# Patient Record
Sex: Male | Born: 1975
Health system: Southern US, Community
[De-identification: ages and names within clinical notes are randomized; demographics above are authoritative.]

## PROBLEM LIST (undated history)

## (undated) ENCOUNTER — Emergency Department (HOSPITAL_COMMUNITY): Admission: EM | Source: Ambulatory Visit

## (undated) ENCOUNTER — Emergency Department: Admission: EM | Payer: No Typology Code available for payment source

## (undated) DIAGNOSIS — L0390 Cellulitis, unspecified: Secondary | ICD-10-CM

## (undated) DIAGNOSIS — Z720 Tobacco use: Secondary | ICD-10-CM

## (undated) DIAGNOSIS — D649 Anemia, unspecified: Secondary | ICD-10-CM

## (undated) DIAGNOSIS — F1990 Other psychoactive substance use, unspecified, uncomplicated: Secondary | ICD-10-CM

## (undated) DIAGNOSIS — F32A Depression, unspecified: Secondary | ICD-10-CM

## (undated) DIAGNOSIS — K921 Melena: Secondary | ICD-10-CM

## (undated) DIAGNOSIS — L0291 Cutaneous abscess, unspecified: Secondary | ICD-10-CM

## (undated) DIAGNOSIS — A4902 Methicillin resistant Staphylococcus aureus infection, unspecified site: Secondary | ICD-10-CM

## (undated) DIAGNOSIS — K219 Gastro-esophageal reflux disease without esophagitis: Secondary | ICD-10-CM

## (undated) DIAGNOSIS — M79604 Pain in right leg: Secondary | ICD-10-CM

## (undated) DIAGNOSIS — Z21 Asymptomatic human immunodeficiency virus [HIV] infection status: Secondary | ICD-10-CM

## (undated) HISTORY — PX: HAND / FINGER LESION EXCISION: SUR531

## (undated) HISTORY — PX: COLONOSCOPY: SHX174

---

## 2003-04-30 ENCOUNTER — Emergency Department (HOSPITAL_COMMUNITY): Admission: EM | Admit: 2003-04-30 | Discharge: 2003-04-30 | Payer: Self-pay | Admitting: Emergency Medicine

## 2006-10-24 DIAGNOSIS — A5273 Symptomatic late syphilis of other respiratory organs: Secondary | ICD-10-CM

## 2006-10-24 HISTORY — DX: Symptomatic late syphilis of other respiratory organs: A52.73

## 2006-11-10 ENCOUNTER — Ambulatory Visit: Payer: Self-pay | Admitting: Internal Medicine

## 2006-11-10 ENCOUNTER — Encounter (INDEPENDENT_AMBULATORY_CARE_PROVIDER_SITE_OTHER): Payer: Self-pay | Admitting: *Deleted

## 2006-11-10 ENCOUNTER — Encounter: Admission: RE | Admit: 2006-11-10 | Discharge: 2006-11-10 | Payer: Self-pay | Admitting: Internal Medicine

## 2006-11-10 LAB — CONVERTED CEMR LAB
CD4 Count: 160 microliters
HIV 1 RNA Quant: 96900 copies/mL

## 2006-11-22 ENCOUNTER — Ambulatory Visit: Payer: Self-pay | Admitting: Internal Medicine

## 2006-11-28 ENCOUNTER — Ambulatory Visit: Payer: Self-pay | Admitting: Internal Medicine

## 2006-11-28 DIAGNOSIS — B20 Human immunodeficiency virus [HIV] disease: Secondary | ICD-10-CM | POA: Insufficient documentation

## 2006-11-28 DIAGNOSIS — A539 Syphilis, unspecified: Secondary | ICD-10-CM | POA: Insufficient documentation

## 2006-12-03 ENCOUNTER — Emergency Department (HOSPITAL_COMMUNITY): Admission: EM | Admit: 2006-12-03 | Discharge: 2006-12-04 | Payer: Self-pay | Admitting: Emergency Medicine

## 2006-12-05 ENCOUNTER — Ambulatory Visit: Payer: Self-pay | Admitting: Internal Medicine

## 2006-12-06 ENCOUNTER — Encounter: Payer: Self-pay | Admitting: Infectious Disease

## 2006-12-06 ENCOUNTER — Ambulatory Visit: Payer: Self-pay | Admitting: Internal Medicine

## 2006-12-18 ENCOUNTER — Encounter (INDEPENDENT_AMBULATORY_CARE_PROVIDER_SITE_OTHER): Payer: Self-pay | Admitting: *Deleted

## 2006-12-18 LAB — CONVERTED CEMR LAB
CD4 Count: 0 microliters
HIV 1 RNA Quant: 0 copies/mL
RPR Titer: 1:16 {titer}

## 2006-12-31 ENCOUNTER — Encounter (INDEPENDENT_AMBULATORY_CARE_PROVIDER_SITE_OTHER): Payer: Self-pay | Admitting: *Deleted

## 2007-01-23 ENCOUNTER — Ambulatory Visit: Payer: Self-pay | Admitting: Internal Medicine

## 2007-01-23 ENCOUNTER — Encounter: Admission: RE | Admit: 2007-01-23 | Discharge: 2007-01-23 | Payer: Self-pay | Admitting: Internal Medicine

## 2007-01-23 LAB — CONVERTED CEMR LAB
Albumin: 4 g/dL (ref 3.5–5.2)
Alkaline Phosphatase: 81 units/L (ref 39–117)
BUN: 15 mg/dL (ref 6–23)
Basophils Relative: 1 % (ref 0–1)
CD4 Count: 270 microliters
CO2: 25 meq/L (ref 19–32)
Chloride: 106 meq/L (ref 96–112)
Eosinophils Absolute: 0.1 10*3/uL (ref 0.0–0.7)
Eosinophils Relative: 3 % (ref 0–5)
Glucose, Bld: 61 mg/dL — ABNORMAL LOW (ref 70–99)
Lymphocytes Relative: 59 % — ABNORMAL HIGH (ref 12–46)
Neutro Abs: 0.5 10*3/uL — ABNORMAL LOW (ref 1.7–7.7)
Platelets: 237 10*3/uL (ref 150–400)
RBC: 4.84 M/uL (ref 4.22–5.81)
RDW: 15.1 % — ABNORMAL HIGH (ref 11.5–14.0)
Total Bilirubin: 0.4 mg/dL (ref 0.3–1.2)

## 2007-01-25 ENCOUNTER — Telehealth: Payer: Self-pay | Admitting: Internal Medicine

## 2007-02-21 ENCOUNTER — Telehealth: Payer: Self-pay | Admitting: Internal Medicine

## 2007-03-28 ENCOUNTER — Telehealth: Payer: Self-pay | Admitting: Internal Medicine

## 2007-04-09 ENCOUNTER — Encounter: Payer: Self-pay | Admitting: Internal Medicine

## 2007-05-22 ENCOUNTER — Telehealth: Payer: Self-pay | Admitting: Internal Medicine

## 2007-06-19 ENCOUNTER — Telehealth: Payer: Self-pay | Admitting: Internal Medicine

## 2007-07-10 ENCOUNTER — Telehealth: Payer: Self-pay | Admitting: Internal Medicine

## 2007-07-23 ENCOUNTER — Telehealth: Payer: Self-pay | Admitting: Internal Medicine

## 2007-08-21 ENCOUNTER — Telehealth: Payer: Self-pay | Admitting: Internal Medicine

## 2007-09-05 IMAGING — CR DG CHEST 2V
2 series · 2 of 2 positions shown · non-contrast
Comparison: None.

CLINICAL DATA: Chest congestion.  Rash on back.  Fever. 
 CHEST ? 2 VIEW:

[view not recorded (1 of 2)]
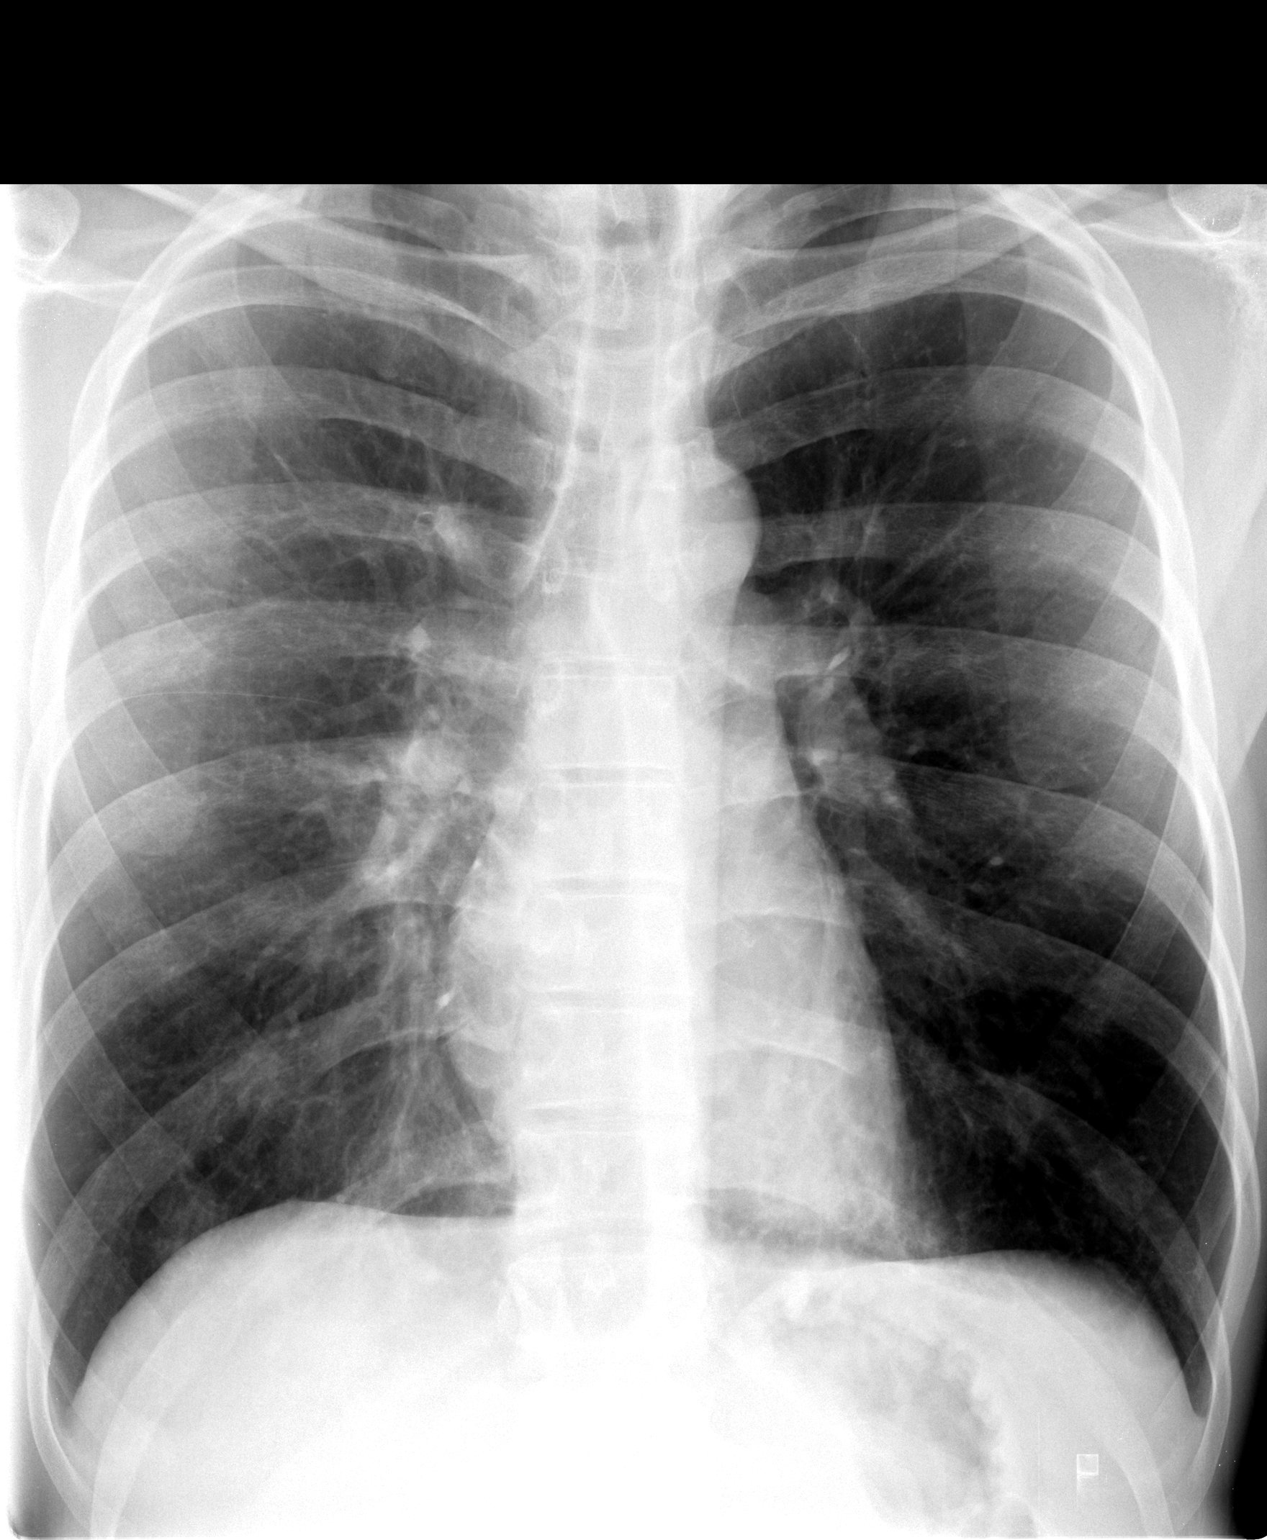

[view not recorded (2 of 2)]
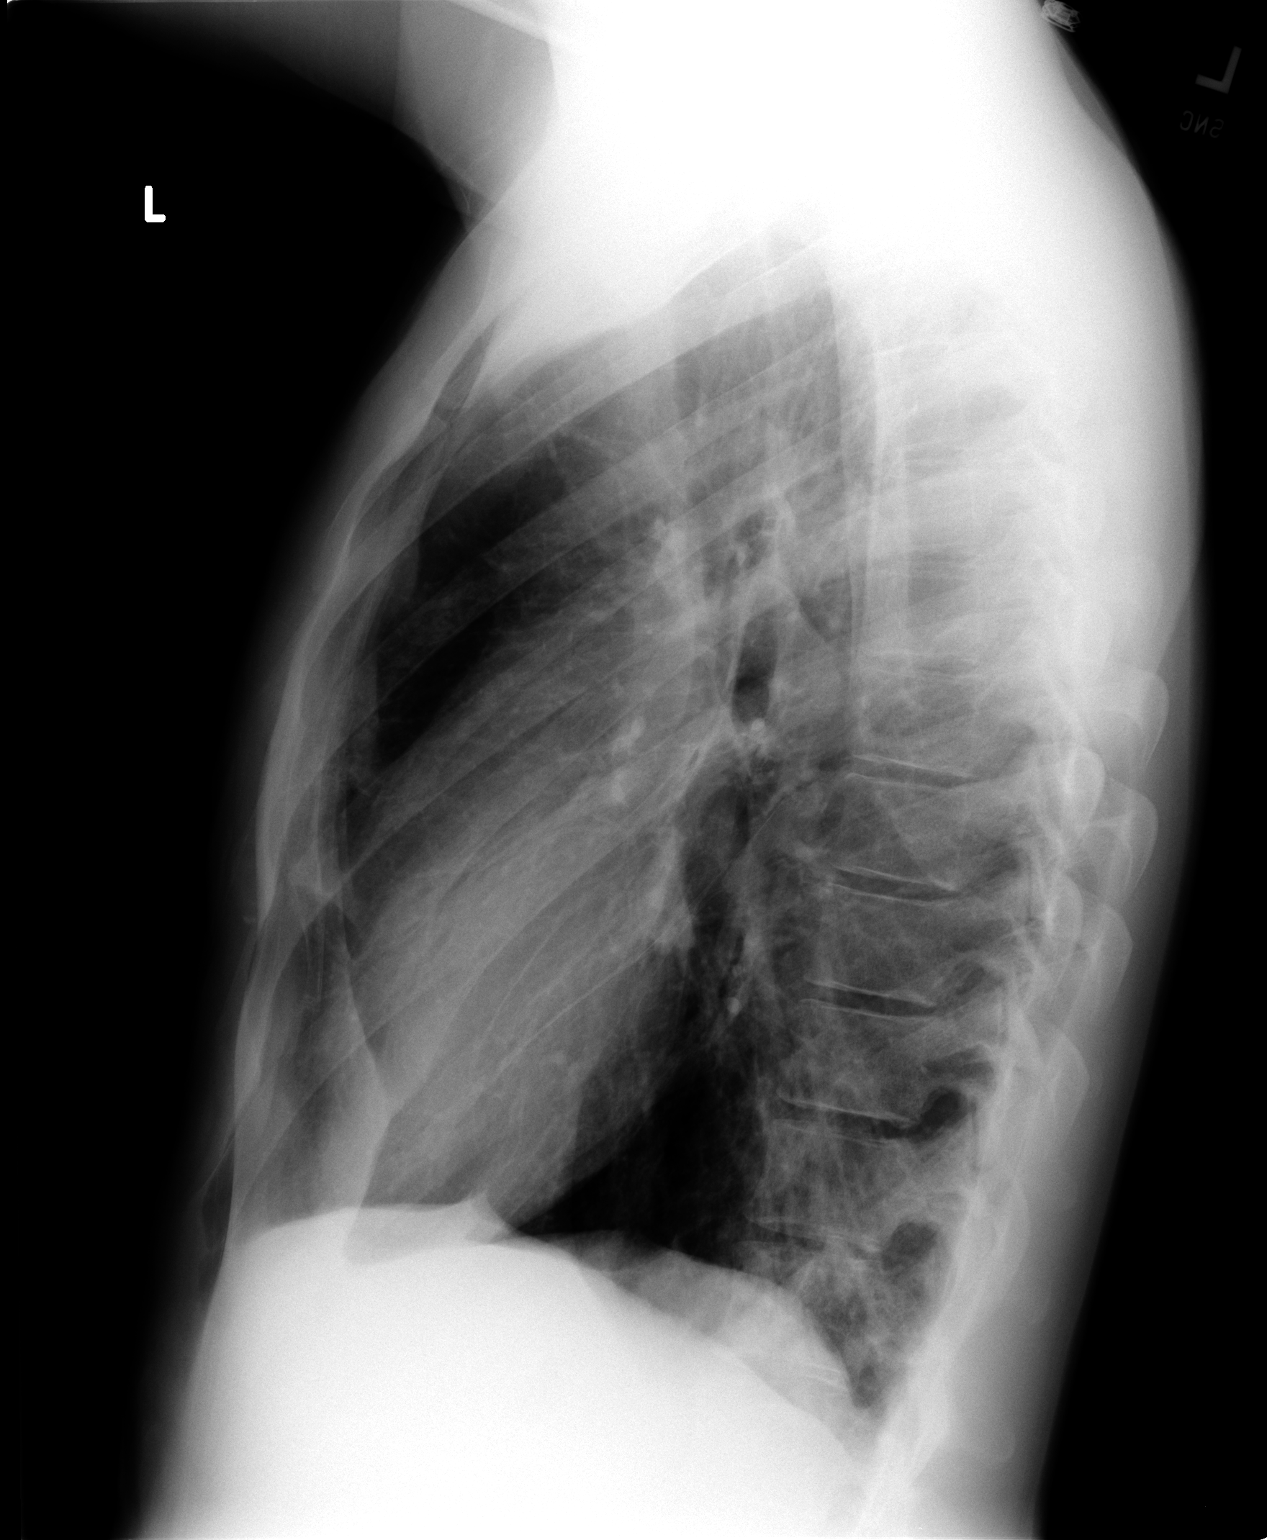

[2 of 2 positions shown; findings below may reference images not displayed]

FINDINGS: Lungs are clear.  Heart size is normal.  No effusion or focal bony abnormality.
IMPRESSION: No acute findings.

## 2007-09-18 ENCOUNTER — Telehealth (INDEPENDENT_AMBULATORY_CARE_PROVIDER_SITE_OTHER): Payer: Self-pay | Admitting: Pharmacy Technician

## 2007-09-24 ENCOUNTER — Telehealth: Payer: Self-pay | Admitting: Internal Medicine

## 2007-09-27 ENCOUNTER — Telehealth: Payer: Self-pay | Admitting: Internal Medicine

## 2007-10-10 ENCOUNTER — Ambulatory Visit: Payer: Self-pay | Admitting: Internal Medicine

## 2007-10-10 ENCOUNTER — Encounter: Admission: RE | Admit: 2007-10-10 | Discharge: 2007-10-10 | Payer: Self-pay | Admitting: Internal Medicine

## 2007-10-23 ENCOUNTER — Encounter (INDEPENDENT_AMBULATORY_CARE_PROVIDER_SITE_OTHER): Payer: Self-pay | Admitting: *Deleted

## 2007-10-23 ENCOUNTER — Telehealth: Payer: Self-pay

## 2007-10-29 ENCOUNTER — Telehealth: Payer: Self-pay | Admitting: Internal Medicine

## 2007-10-29 ENCOUNTER — Encounter: Payer: Self-pay | Admitting: Internal Medicine

## 2007-11-08 ENCOUNTER — Telehealth: Payer: Self-pay | Admitting: Internal Medicine

## 2007-11-29 ENCOUNTER — Telehealth: Payer: Self-pay | Admitting: Internal Medicine

## 2007-12-03 ENCOUNTER — Telehealth: Payer: Self-pay | Admitting: Internal Medicine

## 2007-12-04 ENCOUNTER — Ambulatory Visit: Payer: Self-pay | Admitting: Internal Medicine

## 2007-12-04 DIAGNOSIS — R21 Rash and other nonspecific skin eruption: Secondary | ICD-10-CM | POA: Insufficient documentation

## 2007-12-04 DIAGNOSIS — J209 Acute bronchitis, unspecified: Secondary | ICD-10-CM | POA: Insufficient documentation

## 2007-12-04 LAB — CONVERTED CEMR LAB: HIV-1 RNA Quant, Log: 4.43 — ABNORMAL HIGH (ref <1.70)

## 2007-12-05 ENCOUNTER — Encounter (INDEPENDENT_AMBULATORY_CARE_PROVIDER_SITE_OTHER): Payer: Self-pay | Admitting: *Deleted

## 2007-12-06 ENCOUNTER — Telehealth: Payer: Self-pay | Admitting: Internal Medicine

## 2007-12-25 ENCOUNTER — Telehealth: Payer: Self-pay | Admitting: Internal Medicine

## 2007-12-27 ENCOUNTER — Encounter (INDEPENDENT_AMBULATORY_CARE_PROVIDER_SITE_OTHER): Payer: Self-pay | Admitting: *Deleted

## 2008-01-23 ENCOUNTER — Telehealth (INDEPENDENT_AMBULATORY_CARE_PROVIDER_SITE_OTHER): Payer: Self-pay | Admitting: *Deleted

## 2008-02-08 ENCOUNTER — Telehealth (INDEPENDENT_AMBULATORY_CARE_PROVIDER_SITE_OTHER): Payer: Self-pay | Admitting: *Deleted

## 2008-02-20 ENCOUNTER — Telehealth: Payer: Self-pay | Admitting: Internal Medicine

## 2008-04-25 ENCOUNTER — Other Ambulatory Visit: Payer: Self-pay | Admitting: Emergency Medicine

## 2008-04-25 ENCOUNTER — Inpatient Hospital Stay (HOSPITAL_COMMUNITY): Admission: EM | Admit: 2008-04-25 | Discharge: 2008-04-30 | Payer: Self-pay | Admitting: Emergency Medicine

## 2008-04-25 ENCOUNTER — Ambulatory Visit: Payer: Self-pay | Admitting: Internal Medicine

## 2008-05-13 ENCOUNTER — Ambulatory Visit: Payer: Self-pay | Admitting: Internal Medicine

## 2008-05-13 ENCOUNTER — Encounter: Admission: RE | Admit: 2008-05-13 | Discharge: 2008-05-13 | Payer: Self-pay | Admitting: Internal Medicine

## 2008-05-13 LAB — CONVERTED CEMR LAB: HIV 1 RNA Quant: 56200 copies/mL — ABNORMAL HIGH (ref <50)

## 2008-05-27 ENCOUNTER — Ambulatory Visit: Payer: Self-pay | Admitting: Internal Medicine

## 2008-05-27 DIAGNOSIS — F17200 Nicotine dependence, unspecified, uncomplicated: Secondary | ICD-10-CM | POA: Insufficient documentation

## 2008-05-27 DIAGNOSIS — G4700 Insomnia, unspecified: Secondary | ICD-10-CM | POA: Insufficient documentation

## 2008-05-29 ENCOUNTER — Telehealth (INDEPENDENT_AMBULATORY_CARE_PROVIDER_SITE_OTHER): Payer: Self-pay | Admitting: *Deleted

## 2008-06-26 ENCOUNTER — Telehealth (INDEPENDENT_AMBULATORY_CARE_PROVIDER_SITE_OTHER): Payer: Self-pay | Admitting: *Deleted

## 2008-07-10 ENCOUNTER — Ambulatory Visit: Payer: Self-pay | Admitting: Internal Medicine

## 2008-07-10 LAB — CONVERTED CEMR LAB
BUN: 18 mg/dL (ref 6–23)
CO2: 24 meq/L (ref 19–32)
Calcium: 9.1 mg/dL (ref 8.4–10.5)
Chloride: 105 meq/L (ref 96–112)
Eosinophils Absolute: 0.1 10*3/uL (ref 0.0–0.7)
Glucose, Bld: 97 mg/dL (ref 70–99)
HCT: 40 % (ref 39.0–52.0)
HIV 1 RNA Quant: 386 copies/mL — ABNORMAL HIGH (ref <50)
HIV-1 RNA Quant, Log: 2.59 — ABNORMAL HIGH (ref <1.70)
Lymphocytes Relative: 31 % (ref 12–46)
Monocytes Relative: 10 % (ref 3–12)
Neutro Abs: 2.9 10*3/uL (ref 1.7–7.7)
Neutrophils Relative %: 57 % (ref 43–77)
Potassium: 4 meq/L (ref 3.5–5.3)
RBC: 4.3 M/uL (ref 4.22–5.81)
RDW: 13.7 % (ref 11.5–15.5)

## 2008-07-15 ENCOUNTER — Ambulatory Visit: Payer: Self-pay | Admitting: Internal Medicine

## 2008-07-15 ENCOUNTER — Ambulatory Visit (HOSPITAL_COMMUNITY): Admission: RE | Admit: 2008-07-15 | Discharge: 2008-07-15 | Payer: Self-pay | Admitting: Internal Medicine

## 2008-07-15 DIAGNOSIS — M545 Low back pain, unspecified: Secondary | ICD-10-CM | POA: Insufficient documentation

## 2008-07-15 DIAGNOSIS — L02519 Cutaneous abscess of unspecified hand: Secondary | ICD-10-CM | POA: Insufficient documentation

## 2008-07-15 DIAGNOSIS — L03019 Cellulitis of unspecified finger: Secondary | ICD-10-CM

## 2008-07-28 ENCOUNTER — Telehealth (INDEPENDENT_AMBULATORY_CARE_PROVIDER_SITE_OTHER): Payer: Self-pay | Admitting: *Deleted

## 2008-09-17 ENCOUNTER — Telehealth (INDEPENDENT_AMBULATORY_CARE_PROVIDER_SITE_OTHER): Payer: Self-pay | Admitting: *Deleted

## 2008-11-25 ENCOUNTER — Inpatient Hospital Stay (HOSPITAL_COMMUNITY): Admission: EM | Admit: 2008-11-25 | Discharge: 2008-12-03 | Payer: Self-pay | Admitting: Emergency Medicine

## 2008-11-28 ENCOUNTER — Ambulatory Visit: Payer: Self-pay | Admitting: Internal Medicine

## 2008-12-01 ENCOUNTER — Encounter (INDEPENDENT_AMBULATORY_CARE_PROVIDER_SITE_OTHER): Payer: Self-pay | Admitting: Cardiology

## 2008-12-01 ENCOUNTER — Encounter: Payer: Self-pay | Admitting: Internal Medicine

## 2008-12-03 ENCOUNTER — Telehealth: Payer: Self-pay | Admitting: Internal Medicine

## 2009-01-01 ENCOUNTER — Telehealth: Payer: Self-pay | Admitting: Internal Medicine

## 2009-01-02 ENCOUNTER — Ambulatory Visit: Payer: Self-pay | Admitting: Internal Medicine

## 2009-01-02 DIAGNOSIS — A4102 Sepsis due to Methicillin resistant Staphylococcus aureus: Secondary | ICD-10-CM | POA: Insufficient documentation

## 2009-01-02 DIAGNOSIS — R5383 Other fatigue: Secondary | ICD-10-CM

## 2009-01-02 DIAGNOSIS — K029 Dental caries, unspecified: Secondary | ICD-10-CM | POA: Insufficient documentation

## 2009-01-02 DIAGNOSIS — R5381 Other malaise: Secondary | ICD-10-CM | POA: Insufficient documentation

## 2009-01-02 LAB — CONVERTED CEMR LAB
AST: 16 units/L (ref 0–37)
Albumin: 4.1 g/dL (ref 3.5–5.2)
Alkaline Phosphatase: 91 units/L (ref 39–117)
BUN: 11 mg/dL (ref 6–23)
Basophils Absolute: 0 10*3/uL (ref 0.0–0.1)
Chloride: 106 meq/L (ref 96–112)
Creatinine, Ser: 0.93 mg/dL (ref 0.40–1.50)
Eosinophils Relative: 2 % (ref 0–5)
HCT: 39.7 % (ref 39.0–52.0)
Lymphs Abs: 1 10*3/uL (ref 0.7–4.0)
Monocytes Absolute: 0.5 10*3/uL (ref 0.1–1.0)
Monocytes Relative: 20 % — ABNORMAL HIGH (ref 3–12)
Neutro Abs: 0.8 10*3/uL — ABNORMAL LOW (ref 1.7–7.7)
Neutrophils Relative %: 34 % — ABNORMAL LOW (ref 43–77)
RBC: 4.49 M/uL (ref 4.22–5.81)
Total Protein: 7.7 g/dL (ref 6.0–8.3)
WBC: 2.4 10*3/uL — ABNORMAL LOW (ref 4.0–10.5)

## 2009-01-03 ENCOUNTER — Telehealth (INDEPENDENT_AMBULATORY_CARE_PROVIDER_SITE_OTHER): Payer: Self-pay | Admitting: Internal Medicine

## 2009-01-05 ENCOUNTER — Ambulatory Visit: Payer: Self-pay | Admitting: Internal Medicine

## 2009-01-05 ENCOUNTER — Inpatient Hospital Stay (HOSPITAL_COMMUNITY): Admission: EM | Admit: 2009-01-05 | Discharge: 2009-01-08 | Payer: Self-pay | Admitting: Emergency Medicine

## 2009-01-06 ENCOUNTER — Encounter (INDEPENDENT_AMBULATORY_CARE_PROVIDER_SITE_OTHER): Payer: Self-pay | Admitting: Internal Medicine

## 2009-01-06 ENCOUNTER — Ambulatory Visit: Payer: Self-pay | Admitting: Surgery

## 2009-01-10 ENCOUNTER — Encounter: Payer: Self-pay | Admitting: Internal Medicine

## 2009-01-16 ENCOUNTER — Ambulatory Visit: Payer: Self-pay | Admitting: Internal Medicine

## 2009-01-16 DIAGNOSIS — L02419 Cutaneous abscess of limb, unspecified: Secondary | ICD-10-CM | POA: Insufficient documentation

## 2009-01-16 DIAGNOSIS — L03119 Cellulitis of unspecified part of limb: Secondary | ICD-10-CM

## 2009-02-02 ENCOUNTER — Encounter: Payer: Self-pay | Admitting: Internal Medicine

## 2009-03-06 ENCOUNTER — Ambulatory Visit: Payer: Self-pay | Admitting: Internal Medicine

## 2009-03-06 LAB — CONVERTED CEMR LAB
ALT: 15 units/L (ref 0–53)
AST: 15 units/L (ref 0–37)
BUN: 14 mg/dL (ref 6–23)
Basophils Absolute: 0 10*3/uL (ref 0.0–0.1)
CO2: 27 meq/L (ref 19–32)
Calcium: 8.7 mg/dL (ref 8.4–10.5)
Creatinine, Ser: 1.12 mg/dL (ref 0.40–1.50)
Eosinophils Relative: 3 % (ref 0–5)
GFR calc Af Amer: >60 mL/min (ref >60)
Glucose, Bld: 76 mg/dL (ref 70–99)
HIV 1 RNA Quant: 6840 copies/mL — ABNORMAL HIGH (ref <48)
HIV-1 RNA Quant, Log: 3.84 — ABNORMAL HIGH (ref <1.68)
Hemoglobin: 13.5 g/dL (ref 13.0–17.0)
Lymphocytes Relative: 30 % (ref 12–46)
MCHC: 32.1 g/dL (ref 30.0–36.0)
Monocytes Absolute: 0.6 10*3/uL (ref 0.1–1.0)
Monocytes Relative: 19 % — ABNORMAL HIGH (ref 3–12)
Neutro Abs: 1.5 10*3/uL — ABNORMAL LOW (ref 1.7–7.7)
Potassium: 3.9 meq/L (ref 3.5–5.3)
RBC: 4.44 M/uL (ref 4.22–5.81)
Total Bilirubin: 0.3 mg/dL (ref 0.3–1.2)
WBC: 3.2 10*3/uL — ABNORMAL LOW (ref 4.0–10.5)

## 2009-03-20 ENCOUNTER — Ambulatory Visit: Payer: Self-pay | Admitting: Internal Medicine

## 2009-03-20 DIAGNOSIS — K921 Melena: Secondary | ICD-10-CM | POA: Insufficient documentation

## 2009-03-20 DIAGNOSIS — R634 Abnormal weight loss: Secondary | ICD-10-CM | POA: Insufficient documentation

## 2009-04-01 ENCOUNTER — Telehealth: Payer: Self-pay | Admitting: Internal Medicine

## 2009-04-03 ENCOUNTER — Telehealth: Payer: Self-pay | Admitting: Internal Medicine

## 2009-04-29 ENCOUNTER — Telehealth (INDEPENDENT_AMBULATORY_CARE_PROVIDER_SITE_OTHER): Payer: Self-pay | Admitting: *Deleted

## 2009-05-18 ENCOUNTER — Telehealth (INDEPENDENT_AMBULATORY_CARE_PROVIDER_SITE_OTHER): Payer: Self-pay | Admitting: *Deleted

## 2009-06-01 ENCOUNTER — Encounter (INDEPENDENT_AMBULATORY_CARE_PROVIDER_SITE_OTHER): Payer: Self-pay | Admitting: *Deleted

## 2009-06-01 ENCOUNTER — Encounter: Payer: Self-pay | Admitting: Internal Medicine

## 2009-07-30 ENCOUNTER — Telehealth: Payer: Self-pay | Admitting: Internal Medicine

## 2009-07-31 ENCOUNTER — Ambulatory Visit: Payer: Self-pay | Admitting: Internal Medicine

## 2009-07-31 ENCOUNTER — Telehealth (INDEPENDENT_AMBULATORY_CARE_PROVIDER_SITE_OTHER): Payer: Self-pay | Admitting: *Deleted

## 2009-07-31 LAB — CONVERTED CEMR LAB
AST: 23 units/L (ref 0–37)
Albumin: 4.3 g/dL (ref 3.5–5.2)
Alkaline Phosphatase: 82 units/L (ref 39–117)
Basophils Relative: 1 % (ref 0–1)
CO2: 24 meq/L (ref 19–32)
Calcium: 9 mg/dL (ref 8.4–10.5)
Chloride: 108 meq/L (ref 96–112)
Creatinine, Ser: 0.95 mg/dL (ref 0.40–1.50)
Eosinophils Relative: 3 % (ref 0–5)
HCT: 41.8 % (ref 39.0–52.0)
HIV 1 RNA Quant: 3460 copies/mL — ABNORMAL HIGH (ref <48)
Hemoglobin: 13.9 g/dL (ref 13.0–17.0)
Lymphocytes Relative: 49 % — ABNORMAL HIGH (ref 12–46)
Monocytes Absolute: 0.5 10*3/uL (ref 0.1–1.0)
Monocytes Relative: 14 % — ABNORMAL HIGH (ref 3–12)
Platelets: 161 10*3/uL (ref 150–400)
Potassium: 3.9 meq/L (ref 3.5–5.3)
RBC: 4.39 M/uL (ref 4.22–5.81)
Sodium: 142 meq/L (ref 135–145)
Total Protein: 7.7 g/dL (ref 6.0–8.3)

## 2009-08-21 ENCOUNTER — Ambulatory Visit: Payer: Self-pay | Admitting: Internal Medicine

## 2009-08-21 DIAGNOSIS — L989 Disorder of the skin and subcutaneous tissue, unspecified: Secondary | ICD-10-CM | POA: Insufficient documentation

## 2009-08-31 ENCOUNTER — Telehealth (INDEPENDENT_AMBULATORY_CARE_PROVIDER_SITE_OTHER): Payer: Self-pay | Admitting: *Deleted

## 2009-09-01 ENCOUNTER — Encounter (INDEPENDENT_AMBULATORY_CARE_PROVIDER_SITE_OTHER): Payer: Self-pay | Admitting: *Deleted

## 2009-09-11 ENCOUNTER — Telehealth: Payer: Self-pay | Admitting: Internal Medicine

## 2009-09-24 ENCOUNTER — Telehealth (INDEPENDENT_AMBULATORY_CARE_PROVIDER_SITE_OTHER): Payer: Self-pay | Admitting: *Deleted

## 2009-09-28 ENCOUNTER — Encounter: Payer: Self-pay | Admitting: Internal Medicine

## 2009-10-05 ENCOUNTER — Encounter: Payer: Self-pay | Admitting: Internal Medicine

## 2009-10-28 ENCOUNTER — Telehealth (INDEPENDENT_AMBULATORY_CARE_PROVIDER_SITE_OTHER): Payer: Self-pay | Admitting: *Deleted

## 2009-11-19 ENCOUNTER — Ambulatory Visit: Payer: Self-pay | Admitting: Internal Medicine

## 2009-11-19 LAB — CONVERTED CEMR LAB
Albumin: 4.4 g/dL (ref 3.5–5.2)
CO2: 26 meq/L (ref 19–32)
Calcium: 9.4 mg/dL (ref 8.4–10.5)
Chloride: 106 meq/L (ref 96–112)
Eosinophils Relative: 2 % (ref 0–5)
Glucose, Bld: 83 mg/dL (ref 70–99)
HCT: 42.5 % (ref 39.0–52.0)
HDL: 34 mg/dL — ABNORMAL LOW (ref >39)
HIV 1 RNA Quant: 239 copies/mL — ABNORMAL HIGH (ref <48)
HIV-1 RNA Quant, Log: 2.38 — ABNORMAL HIGH (ref <1.68)
Hemoglobin: 14.2 g/dL (ref 13.0–17.0)
LDL Cholesterol: 59 mg/dL (ref 0–99)
Lymphs Abs: 1 10*3/uL (ref 0.7–4.0)
MCV: 93.6 fL (ref >=78.0)
Monocytes Relative: 13 % — ABNORMAL HIGH (ref 3–12)
Neutro Abs: 1.8 10*3/uL (ref 1.7–7.7)
Neutrophils Relative %: 53 % (ref 43–77)
Platelets: 223 10*3/uL (ref 150–400)
Total Bilirubin: 0.5 mg/dL (ref 0.3–1.2)
Total Protein: 7.5 g/dL (ref 6.0–8.3)
Triglycerides: 151 mg/dL — ABNORMAL HIGH (ref <150)
VLDL: 30 mg/dL (ref 0–40)

## 2009-11-24 ENCOUNTER — Telehealth (INDEPENDENT_AMBULATORY_CARE_PROVIDER_SITE_OTHER): Payer: Self-pay | Admitting: *Deleted

## 2009-12-16 ENCOUNTER — Encounter: Payer: Self-pay | Admitting: Internal Medicine

## 2009-12-16 ENCOUNTER — Inpatient Hospital Stay (HOSPITAL_COMMUNITY): Admission: RE | Admit: 2009-12-16 | Discharge: 2009-12-19 | Payer: Self-pay | Admitting: Internal Medicine

## 2009-12-16 ENCOUNTER — Ambulatory Visit: Payer: Self-pay | Admitting: Infectious Disease

## 2009-12-16 ENCOUNTER — Ambulatory Visit: Payer: Self-pay | Admitting: Internal Medicine

## 2009-12-16 DIAGNOSIS — E860 Dehydration: Secondary | ICD-10-CM | POA: Insufficient documentation

## 2009-12-18 ENCOUNTER — Encounter: Payer: Self-pay | Admitting: Infectious Disease

## 2009-12-18 ENCOUNTER — Encounter: Payer: Self-pay | Admitting: Internal Medicine

## 2009-12-28 ENCOUNTER — Telehealth (INDEPENDENT_AMBULATORY_CARE_PROVIDER_SITE_OTHER): Payer: Self-pay | Admitting: *Deleted

## 2009-12-29 ENCOUNTER — Telehealth (INDEPENDENT_AMBULATORY_CARE_PROVIDER_SITE_OTHER): Payer: Self-pay | Admitting: *Deleted

## 2010-01-14 ENCOUNTER — Ambulatory Visit: Payer: Self-pay | Admitting: Internal Medicine

## 2010-01-27 ENCOUNTER — Telehealth (INDEPENDENT_AMBULATORY_CARE_PROVIDER_SITE_OTHER): Payer: Self-pay | Admitting: *Deleted

## 2010-02-02 ENCOUNTER — Encounter: Payer: Self-pay | Admitting: Internal Medicine

## 2010-02-02 ENCOUNTER — Telehealth (INDEPENDENT_AMBULATORY_CARE_PROVIDER_SITE_OTHER): Payer: Self-pay | Admitting: *Deleted

## 2010-02-05 ENCOUNTER — Encounter: Payer: Self-pay | Admitting: Internal Medicine

## 2010-02-11 ENCOUNTER — Encounter (INDEPENDENT_AMBULATORY_CARE_PROVIDER_SITE_OTHER): Payer: Self-pay | Admitting: *Deleted

## 2010-02-17 ENCOUNTER — Telehealth: Payer: Self-pay | Admitting: Internal Medicine

## 2010-04-29 ENCOUNTER — Telehealth (INDEPENDENT_AMBULATORY_CARE_PROVIDER_SITE_OTHER): Payer: Self-pay | Admitting: *Deleted

## 2010-07-13 ENCOUNTER — Telehealth (INDEPENDENT_AMBULATORY_CARE_PROVIDER_SITE_OTHER): Payer: Self-pay | Admitting: *Deleted

## 2010-09-21 ENCOUNTER — Encounter (INDEPENDENT_AMBULATORY_CARE_PROVIDER_SITE_OTHER): Payer: Self-pay | Admitting: *Deleted

## 2010-11-21 LAB — CONVERTED CEMR LAB
ALT: 11 units/L (ref 0–53)
AST: 14 units/L (ref 0–37)
Albumin: 4.1 g/dL (ref 3.5–5.2)
Alkaline Phosphatase: 80 units/L (ref 39–117)
Alkaline Phosphatase: 91 units/L (ref 39–117)
BUN: 15 mg/dL (ref 6–23)
Bilirubin Urine: NEGATIVE
CO2: 28 meq/L (ref 19–32)
CO2: 29 meq/L (ref 19–32)
Calcium: 9 mg/dL (ref 8.4–10.5)
Chlamydia, Swab/Urine, PCR: NEGATIVE
Creatinine, Ser: 0.78 mg/dL (ref 0.40–1.50)
Creatinine, Ser: 1.2 mg/dL (ref 0.40–1.50)
Eosinophils Absolute: 0.1 10*3/uL — ABNORMAL LOW (ref 0.2–0.7)
Eosinophils Relative: 4 % (ref 0–5)
GC Probe Amp, Urine: NEGATIVE
Glucose, Bld: 76 mg/dL (ref 70–99)
HCV Ab: NEGATIVE
HIV-1 RNA Quant, Log: 4.99 — ABNORMAL HIGH (ref <1.70)
HIV-1 antibody: POSITIVE — AB
HIV-2 Ab: NEGATIVE
HIV: REACTIVE
Hep B Core Total Ab: POSITIVE — AB
Hep B S Ab: NEGATIVE
Hepatitis B Surface Ag: NEGATIVE
Leukocytes, UA: NEGATIVE
Lymphocytes Relative: 46 % (ref 12–46)
Lymphocytes Relative: 49 % — ABNORMAL HIGH (ref 12–46)
Lymphs Abs: 1 10*3/uL (ref 0.7–3.3)
MCHC: 32.8 g/dL (ref 30.0–36.0)
Monocytes Absolute: 0.5 10*3/uL (ref 0.2–0.7)
Neutro Abs: 0.7 10*3/uL — ABNORMAL LOW (ref 1.7–7.7)
Neutro Abs: 0.9 10*3/uL — ABNORMAL LOW (ref 1.7–7.7)
Neutrophils Relative %: 29 % — ABNORMAL LOW (ref 43–77)
Platelets: 145 10*3/uL — ABNORMAL LOW (ref 150–400)
RBC: 4.53 M/uL (ref 4.22–5.81)
RPR Titer: 1:16 {titer} — AB
RPR Titer: 1:4 {titer}
Sodium: 140 meq/L (ref 135–145)
Sodium: 141 meq/L (ref 135–145)
Total Bilirubin: 0.4 mg/dL (ref 0.3–1.2)
Total Bilirubin: 0.4 mg/dL (ref 0.3–1.2)
Total Protein: 7.6 g/dL (ref 6.0–8.3)
Urobilinogen, UA: 0.2 (ref 0.0–1.0)

## 2010-11-23 NOTE — Progress Notes (Signed)
Summary: [facility]/pt assist meds arrived for Feb  Phone Note Refill Request      Prescriptions: NORVIR 100 MG TABS (RITONAVIR) Take 1 tablet by mouth once a day  #30 x 0   Entered by:   Paulo Fruit  BS,CPht II,MPH   Authorized by:   Yisroel Ramming MD   Signed by:   Paulo Fruit  BS,CPht II,MPH on 11/24/2009   Method used:   Samples Given   RxID:   [id number] PREZISTA 400 MG  TABS (DARUNAVIR ETHANOLATE) Take 2  tablets by mouth once a day  #60 x 0   Entered by:   Paulo Fruit  BS,CPht II,MPH   Authorized by:   Yisroel Ramming MD   Signed by:   Paulo Fruit  BS,CPht II,MPH on 11/24/2009   Method used:   Samples Given   RxID:   [id number] TRUVADA 200-300 MG  TABS (EMTRICITABINE-TENOFOVIR) Take 1 tablet by mouth once a day  #30 x 0   Entered by:   Paulo Fruit  BS,CPht II,MPH   Authorized by:   Yisroel Ramming MD   Signed by:   Paulo Fruit  BS,CPht II,MPH on 11/24/2009   Method used:   Samples Given   RxID:   [id number] BACTRIM DS 800-160 MG TABS (SULFAMETHOXAZOLE-TRIMETHOPRIM) Take 1 tablet by mouth once a day  #60 x 0   Entered by:   Paulo Fruit  BS,CPht II,MPH   Authorized by:   Yisroel Ramming MD   Signed by:   Paulo Fruit  BS,CPht II,MPH on 11/24/2009   Method used:   Samples Given   RxID:   [id number]  Patient Assist Medication Verification: Medication name: SMZ-TMP DS 800/160mg  RX # [id number] Tech approval:MLD   Patient Assist Medication Verification: Medication: Prezista 400mg  Lot#[id number] Exp Date:11 2012 Tech approval:MLD                Patient Assist Medication Verification: Medication:Norvir 100mg  XNA#[id number] Exp Date:24 Feb 2011 Tech approval:MLD                Patient Assist Medication Verification: Medication:Truvada Lot# [id number] Exp Date:07 2014 Tech approval:MLD  **Could not reach patient.  Every number on file is disconnected.Paulo Fruit  BS,CPht II,MPH  November 24, 2009 4:49 PM

## 2010-11-23 NOTE — Initial Assessments (Signed)
INTERNAL MEDICINE ADMISSION HISTORY AND PHYSICAL  PCP: Philipp Deputy  R1. Dr Gilford Rile [phone] R2. Dr Aldine Contes  [phone] Holidays or 5pm on weekdays:  1st contact [phone] 2nd contact [phone]  CC: chills/fever X 3 day after boil on buttocks popped  HPI: Patient is a 35 year old [name] with HIV and a CD4 count 220 who presents to [facility], Pt states that he developed a rectal boil that opened 4 days ago and then started to have chills, body aches, SOB, cough productive of brown sputum and diarrhea. He feels like he did when he had MRSA sepsis.  He was unable to sleep last night and could not get warm. The patient complains of anorexia and fever.  The patient denies hemoptysis, melena, and hematochezia.   ALLERGIES: ! * ABACAVIR  PAST MEDICAL HISTORY: HIV disease Syphilis  MEDICATIONS: BACTRIM DS 800-160 MG TABS (SULFAMETHOXAZOLE-TRIMETHOPRIM) Take 1 tablet by mouth once a day TRUVADA 200-300 MG  TABS (EMTRICITABINE-TENOFOVIR) Take 1 tablet by mouth once a day PREZISTA 400 MG  TABS (DARUNAVIR ETHANOLATE) Take 2  tablets by mouth once a day TRAZODONE HCL 50 MG  TABS (TRAZODONE HCL) Take 1 tablet by mouth at bedtime CHANTIX CONTINUING MONTH PAK 1 MG TABS (VARENICLINE TARTRATE) Take 1 tablet by mouth twice a day CHANTIX STARTING MONTH PAK 0.5 MG X 11 & 1 MG X 42  MISC (VARENICLINE TARTRATE)  VICODIN 5-500 MG TABS (HYDROCODONE-ACETAMINOPHEN) Take 1 tablet by mouth every 8 hours MEGACE ORAL 40 MG/ML SUSP (MEGESTROL ACETATE) 10 ml by mouth once daily NORVIR 100 MG TABS (RITONAVIR) Take 1 tablet by mouth once a day   SOCIAL HISTORY: Lives near [place]  FAMILY HISTORY: Non contributary  ROS: The patient complains of anorexia and fever.  The patient denies hemoptysis, melena, and hematochezia.     VITALS: Temp:     98 degrees F oral >>>99.3 before transfer to floor Pulse rate:   118 / minute Pulse (ortho):   119 / minute BP sitting:   101 / 67  (left arm) BP standing:   101  / 72   PHYSICAL EXAM: General:  alert, well-developed, well-nourished, and well-hydrated.   Head:  normocephalic and atraumatic.   Mouth:  pharynx pink and moist.  swelling of left side of lip Lungs:  normal respiratory effort.  crackles that clear with coughing Heart:  tachycardia.   Abdomen:  soft and non-tender.   Rectal:  no external abnormalities.  tender on rectal exam  LABS: ! SODIUM                    135 mEq/L                   135-145 ! POTASSIUM            [L]  3.1 mEq/L                   3.5-5.1 ! CHLORIDE                  101 mEq/L                   96-112 ! CARBON DIOXIDE            26 mEq/L                    19-32 ! GLUCOSE              [H]  101 mg/dL  70-99 ! BUN                       Gabriel mg/dL                    3-08 ! CREATININE                1.13 mg/dL                  6.5-7.8 ! CALCIUM                   8.5 mg/dL                   4.6-96.2 ! GFR, Est Non Af Am        >60 mL/min                  >60 ! GFR, Est Afr Am           >60 mL/min                  >60  ! WBC COUNT                 5.5 K/uL                    4.0-10.5 ! RBC COUNT                 4.41 MIL/uL                 4.22-5.81 ! HEMOGLOBIN                14.4 g/dL                   95.2-84.1 ! HEMATOCRIT                42.0 %                      39.0-52.0 ! MCV                       95.3 fL                     78.0-100.0 ! MCHC                      34.1 g/dL                   32.4-40.1 ! RDW                       14.1 %                      11.5-15.5 ! PLATELET COUNT            162 K/uL                    150-400 ! NEUTROPHIL                71 %                        43-77 ! ABS GRANULOCYTE           3.9 K/uL  1.7-7.7 ! LYMPHOCYTE                20 %                        Gabriel-46 ! ABS LYMPH                 1.1 K/uL                    0.7-4.0 ! MONOCYTE                  9 %                         3-Gabriel ! ABS MONOCYTE              0.5 K/uL                     0.1-1.0 ! EOSINOPHIL                0 %                         0-5 ! ABS EOS                   0.0 K/uL                    0.0-0.7 ! BASOPHIL                  0 %                         0-1 ! ABS BASO                  0.0 K/uL                    0.0-0.1   CXR: IMPRESSION: Chronic changes as above - no active disease.    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 will obtain 2 sets of blood cultures and a CBC and send for a CXR. Give IVF and admit for evlauation and observation. Will consider IV abx in the setting of history of MRSA septicemia and presenting complaint of fever and tachycardia.   #2: DIARRHEA: watery dark brown diarrhea for 4 days now, worse with eating, most likely viral in origin. Plan:  -IVF -clear liquid diet for now -Stool cx o&p, crpto, cdiff.   #3: Rectal boil: Patient states that he had a boil which bursted 4 days ago and pus came out, since then patient has been having diarrhea, patient recalls similar episode in the past  #4: HIV - last CD4 was 220 (10/2009) HIV 1 RNA Quant 239 copies/mL, HIV 1 RNA Quant, Log 2.38 log. Followed by Dr Philipp Deputy. Will continue on ART, and hiv prophylaxis.     VTE PROPH: lovenox SQ 40mg  daily   ATTENDING: I performed and/or observed a history and physical examination of the patient.  I discussed the case with the residents as noted and reviewed the residents' notes.  I agree with the findings and plan--please refer to the attending physician note for more details.  Signature________________________________  Printed Name_____________________________

## 2010-11-23 NOTE — Progress Notes (Signed)
Summary: [facility]/pt assist meds arrived for Apr  Phone Note Refill Request      Prescriptions: NORVIR 100 MG TABS (RITONAVIR) Take 1 tablet by mouth once a day  #30 x 0   Entered by:   Paulo Fruit  BS,CPht II,MPH   Authorized by:   Yisroel Ramming MD   Signed by:   Paulo Fruit  BS,CPht II,MPH on 02/17/2010   Method used:   Samples Given   RxID:   [id number] PREZISTA 400 MG  TABS (DARUNAVIR ETHANOLATE) Take 2  tablets by mouth once a day  #60 x 0   Entered by:   Paulo Fruit  BS,CPht II,MPH   Authorized by:   Yisroel Ramming MD   Signed by:   Paulo Fruit  BS,CPht II,MPH on 02/17/2010   Method used:   Samples Given   RxID:   [id number] TRUVADA 200-300 MG  TABS (EMTRICITABINE-TENOFOVIR) Take 1 tablet by mouth once a day  #30 x 0   Entered by:   Paulo Fruit  BS,CPht II,MPH   Authorized by:   Yisroel Ramming MD   Signed by:   Paulo Fruit  BS,CPht II,MPH on 02/17/2010   Method used:   Samples Given   RxID:   [id number] BACTRIM DS 800-160 MG TABS (SULFAMETHOXAZOLE-TRIMETHOPRIM) Take 1 tablet by mouth once a day  #60 x 0   Entered by:   Paulo Fruit  BS,CPht II,MPH   Authorized by:   Yisroel Ramming MD   Signed by:   Paulo Fruit  BS,CPht II,MPH on 02/17/2010   Method used:   Samples Given   RxID:   [id number]  Patient Assist Medication Verification: Medication name:SMZ-TMP DS 800/160mg  RX # [id number] Tech approval:MLD   Patient Assist Medication Verification: Medication: Prezista 400mg  Lot# [id number] Exp Date:02 2013 Tech approval:MLD                Patient Assist Medication Verification: Medication: Norvir 100mg  Lot# [id number] Exp Date:25 Jun 2011 Tech approval:MLD                Patient Assist Medication Verification: Medication:Truvada Lot# [id number] Exp Date:09 2014 Tech approval:MLD   Tried to contact patient at cell number we have on file.  The number belongs to another person.  Also tried to reach patient at the new phone number he left on file;  that number is disconnected.  Unable to reach patient at the time of call. Paulo Fruit  BS,CPht II,MPH  February 17, 2010 3:06 PM

## 2010-11-23 NOTE — Assessment & Plan Note (Signed)
Summary: acute-aching and coughing/fever/cfb   CC:  f/u ov 2. bodyaches chills/fever X 3 day after boil on buttocks popped and shortness of breath .  History of Present Illness: Pt states that he developed a rectal boil that opened 4 days ago and then started to have chills, body aches, SOB, cough productive of brown sputum and diarrhea. He feels like he did when he had MRSA sepsis.  He was unable to sleep last night and could not get warm.   Updated Prior Medication List: BACTRIM DS 800-160 MG TABS (SULFAMETHOXAZOLE-TRIMETHOPRIM) Take 1 tablet by mouth once a day TRUVADA 200-300 MG  TABS (EMTRICITABINE-TENOFOVIR) Take 1 tablet by mouth once a day PREZISTA 400 MG  TABS (DARUNAVIR ETHANOLATE) Take 2  tablets by mouth once a day TRAZODONE HCL 50 MG  TABS (TRAZODONE HCL) Take 1 tablet by mouth at bedtime CHANTIX CONTINUING MONTH PAK 1 MG TABS (VARENICLINE TARTRATE) Take 1 tablet by mouth twice a day CHANTIX STARTING MONTH PAK 0.5 MG X 11 & 1 MG X 42  MISC (VARENICLINE TARTRATE)  VICODIN 5-500 MG TABS (HYDROCODONE-ACETAMINOPHEN) Take 1 tablet by mouth every 8 hours MEGACE ORAL 40 MG/ML SUSP (MEGESTROL ACETATE) 10 ml by mouth once daily NORVIR 100 MG TABS (RITONAVIR) Take 1 tablet by mouth once a day  Current Allergies: ! * ABACAVIR Past History:  Past Medical History: Last updated: 11/28/2006 HIV disease Syphilis  Review of Systems       The patient complains of anorexia and fever.  The patient denies hemoptysis, melena, and hematochezia.    Vital Signs:  Patient profile:   34 year old male Height:      78 inches Weight:      00 pounds Temp:     98 degrees F oral Pulse rate:   118 / minute Pulse (ortho):   119 / minute BP sitting:   101 / 67  (left arm) BP standing:   101 / 72  Vitals Entered By: Tomasita Morrow RN (December 16, 2009 9:21 AM)  Serial Vital Signs/Assessments:  Time      Position  BP       Pulse  Resp  Temp     By 10:55 AM  Lying LA  85/52    89                     Jennet Maduro RN 10:55 AM  Sitting   98/71    105                   Jennet Maduro RN 10:55 AM  Standing  101/72   119                   Jennet Maduro RN  CC: f/u ov 2. bodyaches chills/fever X 3 day after boil on buttocks popped, shortness of breath  Is Patient Diabetic? No Pain Assessment Patient in pain? yes     Location: body aches  Intensity: 9 Type: aches Onset of pain  X 3 day Nutritional Status Detail normal  Have you ever been in a relationship where you felt threatened, hurt or afraid?No  Domestic Violence Intervention none  Does patient need assistance? Functional Status Self care Ambulation Normal   Physical Exam  General:  alert, well-developed, well-nourished, and well-hydrated.   Head:  normocephalic and atraumatic.   Mouth:  pharynx pink and moist.  swelling of left side of lip Lungs:  normal respiratory effort.  crackles that clear with  coughing Heart:  tachycardia.   Abdomen:  soft and non-tender.   Rectal:  no external abnormalities.  tender on rectal exam   Impression & Recommendations:  Problem # 1:  Hx of METHICILLIN RESISTANT STAPH AUREUS SEPTICEMIA (ICD-038.12) Pt's symptoms consistant with possible sepsis though it could also be a viral syndrome. I will obtain 2 sets of blood cultures and a CBC and send for a CXR. Place IV and admit for evlauation and observation.  Other Orders: T-Basic Metabolic Panel [id number]) T-CBC w/Diff [id number]) T-Culture, Blood Routine ([id number]) T-Culture, Blood Routine ([id number]) Est. Patient Level IV (76160) CXR- 2view (CXR) Process Orders Check Orders Results:     [facility]: ABN not required for this insurance Tests Sent for requisitioning (December 16, 2009 11:02 AM):     12/16/2009: [facility] -- T-Basic Metabolic Panel [id number] (signed)     12/16/2009: [facility] -- T-CBC w/Diff [[id number]] (signed)     12/16/2009: [facility] -- T-Culture, Blood Routine [[id number]] (signed)     12/16/2009: [facility] -- T-Culture, Blood Routine [[id number]] (signed)   Appended Document: acute-aching and coughing/fever/cfb   Vital Signs:  Patient profile:   35 year old male Temp:     101.6 degrees F (38.67 degrees C) oral  Vitals Entered By: Jennet Maduro RN (December 16, 2009 11:31 AM)  Temperature 99.3 degrees F Denise Estridge RN  December 16, 2009 1:35 PM  Medication Administration  Infusion # 1:    Diagnosis: DEHYDRATION (ICD-276.51)    Started: 11:43 AM    Solution: 0.9% NS    Instructions: Infuse over 2 hours    Route: IV infusion    Site: L antecubital fossa    Ordered by: Dr. Delrae Rend    Patient tolerated infusion without complications  Infusion # 2:    Diagnosis: DEHYDRATION (ICD-276.51)    Started: 2:03 PM    Solution: 0.9% NS    Instructions: Infuse over 4 hours    Route: IV infusion    Site: L antecubital fossa    Ordered by: Dr. Delrae Rend    Administered by: Jennet Maduro RN-December 16, 2009 2:03 PM    Comments: Change rate to /hour for 2 L. per Dr. Sandie Ano    Patient tolerated infusion without complications  Medication # 1:    Medication: Tylenol 500 mg tab    Diagnosis: DEHYDRATION (ICD-276.51)    Dose: 2 tablets    Route: po    Exp Date: 06/25/2011    Lot #: [id number]    Mfr: Uvaldo Rising    Comments: Temp. 101.6    Patient tolerated medication without complications    Given by: Jennet Maduro RN (December 16, 2009 11:49 AM)  Orders Added: 1)  0.9% NS [EMRZERO] 2)  Intravenous Infusion Therapy Diag 1 hr [96360] 3)  Tylenol 500 mg tab [EMRORAL] 4)  0.9% NS [EMRZERO] 5)  Intravenous Infusion Therapy Diag 1 hr [96360]  Report called to RN for room 3019.  Pt. transported to rm 3019 via wheelchair with his personal belongings in a duffel bag.  Jennet Maduro RN  December 16, 2009 2:06 PM

## 2010-11-23 NOTE — Assessment & Plan Note (Signed)
Summary: bicillin inj#1  Nurse Visit   Allergies: 1)  ! * Abacavir  Medication Administration  Injection # 1:    Medication: Bicillin LA 1.2 million units Injection    Diagnosis: SYPHILIS NOS (ICD-097.9)    Route: IM    Site: RUOQ gluteus    Exp Date: 08/2012    Lot #: [id number]    Mfr: Brooke Dare    Patient tolerated injection without complications    Given by: Starleen Arms CMA (January 18, 2010 4:34 PM)  Injection # 2:    Medication: Bicillin LA 1.2 million units Injection    Diagnosis: SYPHILIS NOS (ICD-097.9)    Route: IM    Site: LUOQ gluteus    Exp Date: 08/2012    Lot #: [id number]    Mfr: Brooke Dare    Patient tolerated injection without complications    Given by: Starleen Arms CMA (January 18, 2010 4:35 PM)  Orders Added: 1)  Bicillin LA 1.2 million units Injection [J0570] 2)  Bicillin LA 1.2 million units Injection [J0570] 3)  Admin of Therapeutic Inj  intramuscular or subcutaneous [96372]   Medication Administration  Injection # 1:    Medication: Bicillin LA 1.2 million units Injection    Diagnosis: SYPHILIS NOS (ICD-097.9)    Route: IM    Site: RUOQ gluteus    Exp Date: 08/2012    Lot #: [id number]    Mfr: Brooke Dare    Patient tolerated injection without complications    Given by: Starleen Arms CMA (January 18, 2010 4:34 PM)  Injection # 2:    Medication: Bicillin LA 1.2 million units Injection    Diagnosis: SYPHILIS NOS (ICD-097.9)    Route: IM    Site: LUOQ gluteus    Exp Date: 08/2012    Lot #: [id number]    Mfr: Brooke Dare    Patient tolerated injection without complications    Given by: Starleen Arms CMA (January 18, 2010 4:35 PM)  Orders Added: 1)  Bicillin LA 1.2 million units Injection [J0570] 2)  Bicillin LA 1.2 million units Injection [J0570] 3)  Admin of Therapeutic Inj  intramuscular or subcutaneous [62952]

## 2010-11-23 NOTE — Progress Notes (Signed)
Summary: [facility] cannont reach patient.  Phone Note From Pharmacy   Caller: [facility] Summary of Call: [facility] called to obtain an update phone number.  They have been trying to reach patient to set up his delivery, but does not have a good phone number to reach him on.  They will make one more attempt with the phone numbers given  by the physician office; if not reached they will close his file.  If he should contact the doctor office, they would like for patient to be given their number so they can get the information they need to have his file reopened and set up so he may obtain his medication.  The phone number patient needs to be given is [phone].  Patient does have a few months here in the clinic.  I have tried to contact patient when the meds came in through the old ADAP pharmacy [facility], but was also unsuncessful in reaching him. Initial call taken by: Paulo Fruit  BS,CPht II,MPH,  April 29, 2010 8:49 AM     Appended Document: [facility] cannont reach patient. [facility] called back and stated that they got in contact with Caryn Bee.  Byran told them that he has 3 weeks left of medciation and that he does not take them all the time like he should.  He wanted to know if we could mail him his medications that are here from a few months ago, but [facility] told him that they probably would not be able to do that.  [facility] is setting up his order to be delivered from here on out to his home since patient has transportation issues to get to [place] from [place].  They need new prescriptions called into [facility] [address] [place], [place]. [facility] stated that the first number [phone] was a good number to reach the patient.  He also gave 2 additonal numbers to be reached at [phone] and [phone].  The pharmacist talked to him about being compliant with his medications and wanted [place] to know that he has stopped taking his medications for  awhile. Paulo Fruit  BS,CPht II,MPH  April 29, 2010 9:14 AM    Clinical Lists Changes  Medications: Rx of BACTRIM DS 800-160 MG TABS (SULFAMETHOXAZOLE-TRIMETHOPRIM) Take 1 tablet by mouth once a day;  #30 x 6;  Signed;  Entered by: Paulo Fruit  BS,CPht II,MPH;  Authorized by: Yisroel Ramming MD;  Method used: Telephoned to [facility] [phone]*, [address] [place], [place]  [address], Ph: [phone], Fax:  Rx of TRUVADA 200-300 MG  TABS (EMTRICITABINE-TENOFOVIR) Take 1 tablet by mouth once a day;  #30 x 6;  Signed;  Entered by: Paulo Fruit  BS,CPht II,MPH;  Authorized by: Yisroel Ramming MD;  Method used: Telephoned to [facility] [phone]*, [address], [place], [place]  [address], Ph: [phone], Fax:  Rx of PREZISTA 400 MG  TABS (DARUNAVIR ETHANOLATE) Take 2  tablets by mouth once a day;  #60 x 6;  Signed;  Entered by: Paulo Fruit  BS,CPht II,MPH;  Authorized by: Yisroel Ramming MD;  Method used: Telephoned to [facility] [phone]*, [address] [place], [place]  [address], Ph: [phone], Fax:  Rx of NORVIR 100 MG TABS (RITONAVIR) Take 1 tablet by mouth once a day;  #30 x 6;  Signed;  Entered by: Paulo Fruit  BS,CPht II,MPH;  Authorized by: Yisroel Ramming MD;  Method used: Telephoned to [facility] [phone]*, [address] [place], [place]  [address], Ph: [phone], Fax:  Prescriptions: NORVIR 100 MG TABS (RITONAVIR) Take 1 tablet by mouth once a day  #30 x 6   Entered by:   Paulo Fruit  BS,CPht II,MPH   Authorized by:   Yisroel Ramming MD   Signed by:   Paulo Fruit  BS,CPht II,MPH on 04/29/2010   Method used:   Telephoned to ...       [facility] [phone]* (retail)       [address]       [place], [place]  [address]       Ph: [phone]       Fax:    RxID:   [id number] PREZISTA 400 MG  TABS (DARUNAVIR ETHANOLATE) Take 2  tablets by mouth once a day  #60 x 6   Entered by:   Paulo Fruit  BS,CPht II,MPH   Authorized by:   Yisroel Ramming MD   Signed by:   Paulo Fruit  BS,CPht II,MPH on 04/29/2010   Method used:    Telephoned to ...       [facility] [phone]* (retail)       [address]       [place], [place]  [address]       Ph: [phone]       Fax:    RxID:   [id number] TRUVADA 200-300 MG  TABS (EMTRICITABINE-TENOFOVIR) Take 1 tablet by mouth once a day  #30 x 6   Entered by:   Paulo Fruit  BS,CPht II,MPH   Authorized by:   Yisroel Ramming MD   Signed by:   Paulo Fruit  BS,CPht II,MPH on 04/29/2010   Method used:   Telephoned to ...       [facility] [phone]* (retail)       [address]       [place], [place]  [address]       Ph: [phone]       Fax:    RxID:   [id number] BACTRIM DS 800-160 MG TABS (SULFAMETHOXAZOLE-TRIMETHOPRIM) Take 1 tablet by mouth once a day  #30 x 6   Entered by:   Paulo Fruit  BS,CPht II,MPH   Authorized by:   Yisroel Ramming MD   Signed by:   Paulo Fruit  BS,CPht II,MPH on 04/29/2010   Method used:   Telephoned to ...       [facility] [phone]* (retail)       [address]       [place], [place]  [address]       Ph: [phone]       Fax:    RxID:   [id number]

## 2010-11-23 NOTE — Progress Notes (Signed)
Summary: NCADAP/pt assist meds arrived for Jan  Phone Note Refill Request      Prescriptions: NORVIR 100 MG TABS (RITONAVIR) Take 1 tablet by mouth once a day  #30 x 0   Entered by:   Paulo Fruit  BS,CPht II,MPH   Authorized by:   Yisroel Ramming MD   Signed by:   Paulo Fruit  BS,CPht II,MPH on 10/28/2009   Method used:   Samples Given   RxID:   [id number] PREZISTA 400 MG  TABS (DARUNAVIR ETHANOLATE) Take 2  tablets by mouth once a day  #60 x 0   Entered by:   Paulo Fruit  BS,CPht II,MPH   Authorized by:   Yisroel Ramming MD   Signed by:   Paulo Fruit  BS,CPht II,MPH on 10/28/2009   Method used:   Samples Given   RxID:   [id number] TRUVADA 200-300 MG  TABS (EMTRICITABINE-TENOFOVIR) Take 1 tablet by mouth once a day  #30 x 0   Entered by:   Paulo Fruit  BS,CPht II,MPH   Authorized by:   Yisroel Ramming MD   Signed by:   Paulo Fruit  BS,CPht II,MPH on 10/28/2009   Method used:   Samples Given   RxID:   [id number] BACTRIM DS 800-160 MG TABS (SULFAMETHOXAZOLE-TRIMETHOPRIM) Take 1 tablet by mouth once a day  #60 x 0   Entered by:   Paulo Fruit  BS,CPht II,MPH   Authorized by:   Yisroel Ramming MD   Signed by:   Paulo Fruit  BS,CPht II,MPH on 10/28/2009   Method used:   Samples Given   RxID:   [id number]  Patient Assist Medication Verification: Medication name: SMZ-TMP DS 800/160mg  RX # [id number] Tech approval:MLD   Patient Assist Medication Verification: Medication: Prezista 400mg  Lot#[id number] Exp Date:09 2012 Tech approval:MLD                Patient Assist Medication Verification: Medication:Norvir 100mg  OJJ#[id number] Exp Date:24 Dec 2010 Tech approval:MLD                Patient Assist Medication Verification: Medication:Truvada Lot# [id number] Exp Date:05 2014 Tech approval:MLD Call placed to patient with message that assistance medications are ready for pick-up. Left message with male for patient to call [facility] Paulo Fruit  BS,CPht II,MPH   October 28, 2009 10:56 AM

## 2010-11-23 NOTE — Miscellaneous (Signed)
[facility]   Imported By: Florinda Marker 02/05/2010 16:06:10  _____________________________________________________________________  External Attachment:    Type:   Image     Comment:   External Document

## 2010-11-23 NOTE — Progress Notes (Signed)
Summary: NCADAP/pt assist meds arrived for Mar  Phone Note Refill Request      Prescriptions: NORVIR 100 MG TABS (RITONAVIR) Take 1 tablet by mouth once a day  #30 x 0   Entered by:   Paulo Fruit  BS,CPht II,MPH   Authorized by:   Yisroel Ramming MD   Signed by:   Paulo Fruit  BS,CPht II,MPH on 12/28/2009   Method used:   Samples Given   RxID:   [id number] PREZISTA 400 MG  TABS (DARUNAVIR ETHANOLATE) Take 2  tablets by mouth once a day  #60 x 0   Entered by:   Paulo Fruit  BS,CPht II,MPH   Authorized by:   Yisroel Ramming MD   Signed by:   Paulo Fruit  BS,CPht II,MPH on 12/28/2009   Method used:   Samples Given   RxID:   [id number] TRUVADA 200-300 MG  TABS (EMTRICITABINE-TENOFOVIR) Take 1 tablet by mouth once a day  #30 x 0   Entered by:   Paulo Fruit  BS,CPht II,MPH   Authorized by:   Yisroel Ramming MD   Signed by:   Paulo Fruit  BS,CPht II,MPH on 12/28/2009   Method used:   Samples Given   RxID:   [id number] BACTRIM DS 800-160 MG TABS (SULFAMETHOXAZOLE-TRIMETHOPRIM) Take 1 tablet by mouth once a day  #60 x 0   Entered by:   Paulo Fruit  BS,CPht II,MPH   Authorized by:   Yisroel Ramming MD   Signed by:   Paulo Fruit  BS,CPht II,MPH on 12/28/2009   Method used:   Samples Given   RxID:   [id number]  Patient Assist Medication Verification: Medication name: SMZ-TMP DS 800/160mg  RX # [id number] Tech approval:MLD   Patient Assist Medication Verification: Medication: Prezista 400mg  Lot#[id number] Exp Date:12 2012 Tech approval:MLD                Patient Assist Medication Verification: Medication:Truvada Lot# [id number] Exp Date:09 2014 Tech approval:MLD                Patient Assist Medication Verification: Medication: Nathen May 100mg  OAC#[id number] E21 Exp Date:24 Feb 2011 Tech approval:MLD **Patient is supposed to be having a office visit today. A note sent to nurse to have patient see me before he leaves.Paulo Fruit  BS,CPht II,MPH  December 28, 2009 2:22  PM

## 2010-11-23 NOTE — Miscellaneous (Signed)
  Clinical Lists Changes  Observations: Added new observation of YEARAIDSPOS: 2008  (09/21/2010 11:50)

## 2010-11-23 NOTE — Miscellaneous (Signed)
Summary: [facility]  [facility]   Imported By: Florinda Marker 02/04/2010 11:27:18  _____________________________________________________________________  External Attachment:    Type:   Image     Comment:   External Document

## 2010-11-23 NOTE — Progress Notes (Signed)
Summary: PPD  Phone Note Outgoing Call   Call placed by: Annice Pih Summary of Call: Pt. needs PPD at next office visit Initial call taken by: Wendall Mola CMA Duncan Dull),  July 13, 2010 4:37 PM

## 2010-11-23 NOTE — Progress Notes (Signed)
  Phone Note Other Incoming   Caller: [facility]. Reason for Call: Get patient information Details for Reason: release of information Summary of Call: Received a faxed release of info. from [facility] [place], [place] requesting pts. syphilis treatment plan.  Pt. received only first two injections on 01/14/10.  Information faxed . Initial call taken by: Wendall Mola CMA Duncan Dull),  February 02, 2010 12:30 PM

## 2010-11-23 NOTE — Discharge Summary (Signed)
  Hospital Discharge  Date of admission:12/16/2009  Date of discharge:12/19/2009  Brief reason for admission/active problems: came in with perirectal abscess, diarrhea, fever>>  -Surgery seen him didnt do anything as patient didnt have visible abscess  -GI seen him>> scoped..>> results per EMR..   -While in hospital he was given vanc + zosyn..  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 -Script for doxy for 21 days given.    The medication and problem lists have been updated.  Please see the dictated discharge summary for details.   Patient Instructions: 1)  Please come for an appointment at the outpatient clinic at [facility] on 03/07 at 03:00 pm with Dr. Philipp Deputy for a followup visit. 2)  Please take your medication as prescribed below. 3)  If you have any problem, Please call the clinic.  4)  In case of an emergency  dial 911 or go to the emergency department.

## 2010-11-23 NOTE — Miscellaneous (Signed)
Summary: clinical update/ryan white NCADAP apprv til 01/22/11  Clinical Lists Changes  Observations: Added new observation of AIDSDAP: Yes 2011 (02/11/2010 14:45) 

## 2010-11-23 NOTE — Progress Notes (Signed)
Summary: Pt. no show  ---- Converted from flag ---- ---- 12/29/2009 2:59 PM, Wendall Mola CMA ( AAMA) wrote: this patient was a no show yesterday  ---- 12/28/2009 2:20 PM, Paulo Fruit  BS,CPht II,MPH wrote: Please send this patient to see me before he leaves.  I have medications for him.  I couldn't give to him last time he was here because he was admitted. ------------------------------  Patient never came. Paulo Fruit  BS,CPht II,MPH  December 29, 2009 3:19 PM'

## 2010-11-23 NOTE — Progress Notes (Signed)
Summary: NCADAP/pt assist meds arrived for Apr  Phone Note Refill Request      Prescriptions: NORVIR 100 MG TABS (RITONAVIR) Take 1 tablet by mouth once a day  #30 x 0   Entered by:   Paulo Fruit  BS,CPht II,MPH   Authorized by:   Yisroel Ramming MD   Signed by:   Paulo Fruit  BS,CPht II,MPH on 01/27/2010   Method used:   Samples Given   RxID:   [id number] PREZISTA 400 MG  TABS (DARUNAVIR ETHANOLATE) Take 2  tablets by mouth once a day  #60 x 0   Entered by:   Paulo Fruit  BS,CPht II,MPH   Authorized by:   Yisroel Ramming MD   Signed by:   Paulo Fruit  BS,CPht II,MPH on 01/27/2010   Method used:   Samples Given   RxID:   [id number] TRUVADA 200-300 MG  TABS (EMTRICITABINE-TENOFOVIR) Take 1 tablet by mouth once a day  #30 x 0   Entered by:   Paulo Fruit  BS,CPht II,MPH   Authorized by:   Yisroel Ramming MD   Signed by:   Paulo Fruit  BS,CPht II,MPH on 01/27/2010   Method used:   Samples Given   RxID:   [id number] BACTRIM DS 800-160 MG TABS (SULFAMETHOXAZOLE-TRIMETHOPRIM) Take 1 tablet by mouth once a day  #60 x 0   Entered by:   Paulo Fruit  BS,CPht II,MPH   Authorized by:   Yisroel Ramming MD   Signed by:   Paulo Fruit  BS,CPht II,MPH on 01/27/2010   Method used:   Samples Given   RxID:   [id number]  Patient Assist Medication Verification: Medication name: SMZ-TMP DS 800/160mg  RX # [id number] Tech approval:MLD   Patient Assist Medication Verification: Medication:Prezista 400mg  Lot#[id number] Exp Date:12 2012 Tech approval:MLD                Patient Assist Medication Verification: Medication:Truvada Lot# [id number] Exp Date:09 2014 Tech approval:MLD                Patient Assist Medication Verification: Medication:Norvir 100mg  RJJ#[id number] Exp Date:25 Jul 2011 Tech approval:MLD  Tried to contact patient. Was unable to leave message at the time call was placed. No answering machine. Paulo Fruit  BS,CPht II,MPH  January 27, 2010 10:58 AM          Appended Document: NCADAP/pt assist meds arrived for Apr Patient returned call.  He is trying to decide if he wants to switch doctors and go to [facility]  He said [facility] is a little closer, but will be sure to let us know.

## 2010-12-15 ENCOUNTER — Encounter (INDEPENDENT_AMBULATORY_CARE_PROVIDER_SITE_OTHER): Payer: Self-pay | Admitting: *Deleted

## 2010-12-21 NOTE — Letter (Signed)
Summary: [facility]  [address]   [place], [place] [address]   Phone: [phone]  Fax: [phone]          December 15, 2010  [facility] [place] [address] [place], [place]  [address]  Dear Mr. Moen,  It has been a year since your last Office Visit with your Infectious Disease physician.  We have tried to contact you by phone but we do not have an accurate number for you.  Please call the number listed above for the Center to make an appointment for [facility] and the physician.  Please call as soon as possible to schedule these important follow-up appointments.  Thank you for coming to the Center for your care.  Please note the new address for the Center.  Sincerely,   Jennet Maduro [facility]

## 2010-12-27 ENCOUNTER — Other Ambulatory Visit (INDEPENDENT_AMBULATORY_CARE_PROVIDER_SITE_OTHER): Payer: Self-pay

## 2010-12-27 ENCOUNTER — Encounter: Payer: Self-pay | Admitting: Adult Health

## 2010-12-27 ENCOUNTER — Encounter: Payer: Self-pay | Admitting: Infectious Diseases

## 2010-12-27 ENCOUNTER — Other Ambulatory Visit: Payer: Self-pay | Admitting: Infectious Diseases

## 2010-12-27 DIAGNOSIS — B20 Human immunodeficiency virus [HIV] disease: Secondary | ICD-10-CM

## 2010-12-27 LAB — CONVERTED CEMR LAB
HIV 1 RNA Quant: 2230 copies/mL — ABNORMAL HIGH (ref <20)
HIV-1 RNA Quant, Log: 3.35 — ABNORMAL HIGH (ref <1.30)

## 2010-12-28 LAB — CONVERTED CEMR LAB
CO2: 29 meq/L (ref 19–32)
Eosinophils Absolute: 0.1 10*3/uL (ref 0.0–0.7)
Glucose, Bld: 91 mg/dL (ref 70–99)
Lymphocytes Relative: 30 % (ref 12–46)
Lymphs Abs: 1.1 10*3/uL (ref 0.7–4.0)
MCV: 98.6 fL (ref 78.0–100.0)
Monocytes Relative: 9 % (ref 3–12)
Neutro Abs: 2.2 10*3/uL (ref 1.7–7.7)
Neutrophils Relative %: 58 % (ref 43–77)
RBC: 4.39 M/uL (ref 4.22–5.81)
RPR Titer: 1:8 {titer}
Sodium: 142 meq/L (ref 135–145)
T pallidum Antibodies (TP-PA): >8 — ABNORMAL HIGH (ref <0.90)
Total Bilirubin: 0.3 mg/dL (ref 0.3–1.2)
Total Protein: 7.1 g/dL (ref 6.0–8.3)
WBC: 3.8 10*3/uL — ABNORMAL LOW (ref 4.0–10.5)

## 2011-01-04 NOTE — Miscellaneous (Addendum)
Summary: Orders Update  Clinical Lists Changes  Orders: Added new Test order of T-CBC w/Diff [phone]) - Signed Added new Test order of T-CD4SP [facility]) (CD4SP) - Signed Added new Test order of T-Comprehensive Metabolic Panel [phone]) - Signed Added new Test order of T-HIV Viral Load [phone]) - Signed Added new Test order of T-RPR (Syphilis) ([phone]) - Signed

## 2011-01-09 LAB — T-HELPER CELL (CD4) - (RCID CLINIC ONLY): CD4 T Cell Abs: 220 uL — ABNORMAL LOW (ref 400–2700)

## 2011-01-10 ENCOUNTER — Ambulatory Visit: Payer: Self-pay | Admitting: Adult Health

## 2011-01-12 LAB — FECAL LACTOFERRIN, QUANT

## 2011-01-12 LAB — BASIC METABOLIC PANEL
BUN: 12 mg/dL (ref 6–23)
CO2: 24 mEq/L (ref 19–32)
CO2: 26 mEq/L (ref 19–32)
Calcium: 7.9 mg/dL — ABNORMAL LOW (ref 8.4–10.5)
Calcium: 8.5 mg/dL (ref 8.4–10.5)
Chloride: 107 mEq/L (ref 96–112)
Chloride: 109 mEq/L (ref 96–112)
Creatinine, Ser: 0.94 mg/dL (ref 0.4–1.5)
Creatinine, Ser: 1.06 mg/dL (ref 0.4–1.5)
Creatinine, Ser: 1.13 mg/dL (ref 0.4–1.5)
GFR calc Af Amer: >60 mL/min (ref >60)
GFR calc Af Amer: >60 mL/min (ref >60)
GFR calc non Af Amer: >60 mL/min (ref >60)
GFR calc non Af Amer: >60 mL/min (ref >60)
GFR calc non Af Amer: >60 mL/min (ref >60)
Glucose, Bld: 101 mg/dL — ABNORMAL HIGH (ref 70–99)
Glucose, Bld: 95 mg/dL (ref 70–99)
Glucose, Bld: 97 mg/dL (ref 70–99)
Potassium: 3.9 mEq/L (ref 3.5–5.1)
Sodium: 137 mEq/L (ref 135–145)
Sodium: 140 mEq/L (ref 135–145)

## 2011-01-12 LAB — DIFFERENTIAL
Basophils Absolute: 0 10*3/uL (ref 0.0–0.1)
Basophils Relative: 0 % (ref 0–1)
Lymphocytes Relative: 20 % (ref 12–46)
Neutro Abs: 3.9 10*3/uL (ref 1.7–7.7)

## 2011-01-12 LAB — CULTURE, BLOOD (ROUTINE X 2): Culture: NO GROWTH

## 2011-01-12 LAB — CBC
HCT: 36.1 % — ABNORMAL LOW (ref 39.0–52.0)
Hemoglobin: 12.2 g/dL — ABNORMAL LOW (ref 13.0–17.0)
MCHC: 33.7 g/dL (ref 30.0–36.0)
MCHC: 34.1 g/dL (ref 30.0–36.0)
MCHC: 34.7 g/dL (ref 30.0–36.0)
MCV: 94.9 fL (ref 78.0–100.0)
MCV: 95.3 fL (ref 78.0–100.0)
Platelets: 128 10*3/uL — ABNORMAL LOW (ref 150–400)
Platelets: 162 10*3/uL (ref 150–400)
RBC: 3.65 MIL/uL — ABNORMAL LOW (ref 4.22–5.81)
RDW: 13.9 % (ref 11.5–15.5)
RDW: 13.9 % (ref 11.5–15.5)
RDW: 14.1 % (ref 11.5–15.5)
WBC: 3 10*3/uL — ABNORMAL LOW (ref 4.0–10.5)

## 2011-01-12 LAB — T-HELPER CELLS (CD4) COUNT (NOT AT ARMC)
CD4 % Helper T Cell: 19 % — ABNORMAL LOW (ref 33–55)
CD4 T Cell Abs: 260 uL — ABNORMAL LOW (ref 400–2700)

## 2011-01-12 LAB — HERPES SIMPLEX VIRUS CULTURE

## 2011-01-12 LAB — GIARDIA/CRYPTOSPORIDIUM SCREEN(EIA)
Cryptosporidium Screen (EIA): NEGATIVE
Giardia Screen - EIA: NEGATIVE

## 2011-01-12 LAB — HEPATIC FUNCTION PANEL
ALT: 24 U/L (ref 0–53)
Albumin: 3.1 g/dL — ABNORMAL LOW (ref 3.5–5.2)
Alkaline Phosphatase: 65 U/L (ref 39–117)
Indirect Bilirubin: 0.8 mg/dL (ref 0.3–0.9)
Total Protein: 6.5 g/dL (ref 6.0–8.3)

## 2011-01-12 LAB — STOOL CULTURE

## 2011-01-12 LAB — HIV-1 RNA ULTRAQUANT REFLEX TO GENTYP+: HIV-1 RNA Quant, Log: 3.52 {Log} — ABNORMAL HIGH (ref <1.68)

## 2011-01-12 LAB — CLOSTRIDIUM DIFFICILE EIA: C difficile Toxins A+B, EIA: NEGATIVE

## 2011-01-25 ENCOUNTER — Encounter: Payer: Self-pay | Admitting: Adult Health

## 2011-01-25 ENCOUNTER — Ambulatory Visit (INDEPENDENT_AMBULATORY_CARE_PROVIDER_SITE_OTHER): Payer: Self-pay | Admitting: Adult Health

## 2011-01-25 DIAGNOSIS — B353 Tinea pedis: Secondary | ICD-10-CM

## 2011-01-25 DIAGNOSIS — B351 Tinea unguium: Secondary | ICD-10-CM

## 2011-01-25 DIAGNOSIS — R238 Other skin changes: Secondary | ICD-10-CM

## 2011-01-25 DIAGNOSIS — L853 Xerosis cutis: Secondary | ICD-10-CM

## 2011-01-25 DIAGNOSIS — Z21 Asymptomatic human immunodeficiency virus [HIV] infection status: Secondary | ICD-10-CM

## 2011-01-25 DIAGNOSIS — B20 Human immunodeficiency virus [HIV] disease: Secondary | ICD-10-CM

## 2011-01-25 MED ORDER — TERBINAFINE HCL 250 MG PO TABS: 250.0000 mg | ORAL_TABLET | Freq: Every day | ORAL | Status: DC

## 2011-01-25 MED ORDER — NYSTATIN 100000 UNIT/GM EX CREA: TOPICAL_CREAM | Freq: Two times a day (BID) | CUTANEOUS | Status: DC

## 2011-01-25 NOTE — Patient Instructions (Signed)
Wash feet twice daily Apply Nystatin cream to feet twice daily including toenails and spaces between toes. Apply Lac-Hydrin lotion (amonium lactate) to hands at bedtime and apply glove over affected hand

## 2011-01-27 LAB — T-HELPER CELL (CD4) - (RCID CLINIC ONLY): CD4 % Helper T Cell: 18 % — ABNORMAL LOW (ref 33–55)

## 2011-01-27 LAB — HIV-1 RNA ULTRAQUANT REFLEX TO GENTYP+
HIV 1 RNA Quant: 772 copies/mL — ABNORMAL HIGH (ref <20)
HIV-1 RNA Quant, Log: 2.89 {Log} — ABNORMAL HIGH (ref <1.30)

## 2011-02-01 LAB — T-HELPER CELL (CD4) - (RCID CLINIC ONLY): CD4 T Cell Abs: 200 uL — ABNORMAL LOW (ref 400–2700)

## 2011-02-03 LAB — BASIC METABOLIC PANEL
BUN: 6 mg/dL (ref 6–23)
BUN: 7 mg/dL (ref 6–23)
BUN: 8 mg/dL (ref 6–23)
CO2: 25 mEq/L (ref 19–32)
CO2: 24 mEq/L (ref 19–32)
Calcium: 7.7 mg/dL — ABNORMAL LOW (ref 8.4–10.5)
Chloride: 109 mEq/L (ref 96–112)
Chloride: 104 mEq/L (ref 96–112)
Creatinine, Ser: 0.96 mg/dL (ref 0.4–1.5)
GFR calc non Af Amer: >60 mL/min (ref >60)
Glucose, Bld: 123 mg/dL — ABNORMAL HIGH (ref 70–99)
Potassium: 3.7 mEq/L (ref 3.5–5.1)
Potassium: 3.2 mEq/L — ABNORMAL LOW (ref 3.5–5.1)
Sodium: 136 mEq/L (ref 135–145)

## 2011-02-03 LAB — CBC
HCT: 30 % — ABNORMAL LOW (ref 39.0–52.0)
HCT: 33.4 % — ABNORMAL LOW (ref 39.0–52.0)
HCT: 34 % — ABNORMAL LOW (ref 39.0–52.0)
Hemoglobin: 10.7 g/dL — ABNORMAL LOW (ref 13.0–17.0)
Hemoglobin: 12.3 g/dL — ABNORMAL LOW (ref 13.0–17.0)
MCHC: 34.2 g/dL (ref 30.0–36.0)
MCHC: 35.8 g/dL (ref 30.0–36.0)
MCHC: 34.7 g/dL (ref 30.0–36.0)
MCV: 91.4 fL (ref 78.0–100.0)
MCV: 91.8 fL (ref 78.0–100.0)
MCV: 91.4 fL (ref 78.0–100.0)
MCV: 92.3 fL (ref 78.0–100.0)
Platelets: 156 10*3/uL (ref 150–400)
Platelets: 189 10*3/uL (ref 150–400)
Platelets: 146 10*3/uL — ABNORMAL LOW (ref 150–400)
RBC: 3.28 MIL/uL — ABNORMAL LOW (ref 4.22–5.81)
RBC: 3.64 MIL/uL — ABNORMAL LOW (ref 4.22–5.81)
RBC: 3.87 MIL/uL — ABNORMAL LOW (ref 4.22–5.81)
RDW: 14.8 % (ref 11.5–15.5)
RDW: 14.5 % (ref 11.5–15.5)
WBC: 3.6 10*3/uL — ABNORMAL LOW (ref 4.0–10.5)
WBC: 4.6 10*3/uL (ref 4.0–10.5)
WBC: 6 10*3/uL (ref 4.0–10.5)

## 2011-02-03 LAB — MAGNESIUM
Magnesium: 1.9 mg/dL (ref 1.5–2.5)
Magnesium: 1.6 mg/dL (ref 1.5–2.5)

## 2011-02-03 LAB — CULTURE, BLOOD (ROUTINE X 2): Culture: NO GROWTH

## 2011-02-03 LAB — DIFFERENTIAL
Eosinophils Absolute: 0 10*3/uL (ref 0.0–0.7)
Eosinophils Relative: 1 % (ref 0–5)
Lymphocytes Relative: 12 % (ref 12–46)
Lymphs Abs: 1 10*3/uL (ref 0.7–4.0)
Lymphs Abs: 1 10*3/uL (ref 0.7–4.0)
Monocytes Relative: 12 % (ref 3–12)
Neutro Abs: 6.4 10*3/uL (ref 1.7–7.7)
Neutrophils Relative %: 78 % — ABNORMAL HIGH (ref 43–77)

## 2011-02-03 LAB — URINALYSIS, ROUTINE W REFLEX MICROSCOPIC
Leukocytes, UA: NEGATIVE
Nitrite: NEGATIVE
Specific Gravity, Urine: >1.046 — ABNORMAL HIGH (ref 1.005–1.030)
Urobilinogen, UA: 1 mg/dL (ref 0.0–1.0)

## 2011-02-03 LAB — COMPREHENSIVE METABOLIC PANEL
CO2: 29 mEq/L (ref 19–32)
Calcium: 8.4 mg/dL (ref 8.4–10.5)
Creatinine, Ser: 1.12 mg/dL (ref 0.4–1.5)
GFR calc non Af Amer: >60 mL/min (ref >60)
Glucose, Bld: 100 mg/dL — ABNORMAL HIGH (ref 70–99)
Total Protein: 6.7 g/dL (ref 6.0–8.3)

## 2011-02-03 LAB — GLUCOSE, CAPILLARY: Glucose-Capillary: 85 mg/dL (ref 70–99)

## 2011-02-03 LAB — URINE MICROSCOPIC-ADD ON

## 2011-02-03 LAB — RAPID URINE DRUG SCREEN, HOSP PERFORMED
Amphetamines: POSITIVE — AB
Barbiturates: NOT DETECTED
Tetrahydrocannabinol: NOT DETECTED

## 2011-02-08 LAB — DIFFERENTIAL
Basophils Absolute: 0 10*3/uL (ref 0.0–0.1)
Basophils Relative: 0 % (ref 0–1)
Eosinophils Relative: 0 % (ref 0–5)
Lymphocytes Relative: 11 % — ABNORMAL LOW (ref 12–46)
Monocytes Absolute: 1.2 10*3/uL — ABNORMAL HIGH (ref 0.1–1.0)
Monocytes Relative: 14 % — ABNORMAL HIGH (ref 3–12)

## 2011-02-08 LAB — CBC
HCT: 31.8 % — ABNORMAL LOW (ref 39.0–52.0)
HCT: 40.4 % (ref 39.0–52.0)
Hemoglobin: 13.7 g/dL (ref 13.0–17.0)
MCHC: 33.9 g/dL (ref 30.0–36.0)
MCV: 93 fL (ref 78.0–100.0)
Platelets: 121 10*3/uL — ABNORMAL LOW (ref 150–400)
RDW: 13.1 % (ref 11.5–15.5)
RDW: 13.2 % (ref 11.5–15.5)

## 2011-02-08 LAB — WOUND CULTURE

## 2011-02-08 LAB — CULTURE, BLOOD (ROUTINE X 2): Culture: NO GROWTH

## 2011-02-08 LAB — BASIC METABOLIC PANEL
CO2: 25 mEq/L (ref 19–32)
Glucose, Bld: 100 mg/dL — ABNORMAL HIGH (ref 70–99)
Potassium: 3.5 mEq/L (ref 3.5–5.1)
Sodium: 132 mEq/L — ABNORMAL LOW (ref 135–145)

## 2011-02-08 LAB — ANAEROBIC CULTURE

## 2011-02-08 LAB — HIV-1 RNA, QUALITATIVE, TMA: HIV-1 RNA, Qualitative, TMA: DETECTED

## 2011-02-08 LAB — T-HELPER CELLS (CD4) COUNT (NOT AT ARMC): CD4 % Helper T Cell: 15 % — ABNORMAL LOW (ref 33–55)

## 2011-02-25 ENCOUNTER — Encounter: Payer: Self-pay | Admitting: Adult Health

## 2011-02-25 ENCOUNTER — Ambulatory Visit (INDEPENDENT_AMBULATORY_CARE_PROVIDER_SITE_OTHER): Payer: Self-pay | Admitting: Adult Health

## 2011-02-25 DIAGNOSIS — B20 Human immunodeficiency virus [HIV] disease: Secondary | ICD-10-CM

## 2011-02-25 DIAGNOSIS — A539 Syphilis, unspecified: Secondary | ICD-10-CM

## 2011-02-25 DIAGNOSIS — Z79899 Other long term (current) drug therapy: Secondary | ICD-10-CM

## 2011-02-25 NOTE — Progress Notes (Signed)
  Subjective:    Patient ID: Gabriel [name], male    DOB: [birth year], 35 y.o.   MRN: [id number]  HPI presents to clinic for followup. Endorses adherence to his medications with good tolerance and no missed doses. Voices no physical complaints.    Review of Systems  Constitutional: Negative.   HENT: Negative.   Eyes: Negative.   Respiratory: Negative.   Cardiovascular: Negative.   Gastrointestinal: Negative.   Genitourinary: Negative.   Musculoskeletal: Negative.   Skin: Negative.   Neurological: Negative.   Hematological: Negative.   Psychiatric/Behavioral: Negative.  Negative for agitation.       Objective:   Physical Exam  Constitutional: He is oriented to person, place, and time. He appears well-developed and well-nourished.  HENT:  Head: Normocephalic and atraumatic.  Right Ear: External ear normal.  Left Ear: External ear normal.  Nose: Nose normal.  Mouth/Throat: Oropharynx is clear and moist. No oropharyngeal exudate.  Eyes: Conjunctivae and EOM are normal. Pupils are equal, round, and reactive to light.  Neck: Normal range of motion. Neck supple.  Cardiovascular: Normal rate, regular rhythm, normal heart sounds and intact distal pulses.   Pulmonary/Chest: Effort normal and breath sounds normal.  Abdominal: Soft. Bowel sounds are normal.  Musculoskeletal: Normal range of motion.  Neurological: He is alert and oriented to person, place, and time. No cranial nerve deficit. Coordination normal.  Skin: Skin is warm and dry.  Psychiatric: He has a normal mood and affect. His behavior is normal. Judgment and thought content normal.          Assessment & Plan:  1. HIV. His CD4 count in 12/27/2010 was 300 at 25%. The repeat was performed on his repeat blood draw in April of 2012, his viral load in March 2012 was 2230 copies per mL. Repeat viral load in April 2012 was 772 copies per mL, which demonstrates slightly less than a one half log drop from his previous value. We'll  repeat his staging labs in 4 weeks and if there is no further improvement in his viral load and we are unable to obtain a genotype. Treatment intensification with Isentress might be warranted. He will followup with [place] in 6 weeks to plan his ongoing care.  2. Syphilis. Most recent RPR was 1:8, which is still significantly lower than previous values. He was last treated in [facility] in December of 2011. We will continue to watch his titers and if there is either no improvement or rising titers on his next blood draw. We will plan for retreatment. He verbally acknowledged this information and agreed with plan of care.

## 2011-03-08 NOTE — Discharge Summary (Signed)
NAMENESTER, BACHUS                ACCOUNT NO.:  [id number]   MEDICAL RECORD NO.:  [id number]          PATIENT TYPE:  INP   LOCATION:  1535                         FACILITY:  [facility]   PHYSICIAN:  Sharlet Salina T. Hoxworth, M.D.DATE OF BIRTH:  25-Feb-1976   DATE OF ADMISSION:  11/25/2008  DATE OF DISCHARGE:  12/03/2008                               DISCHARGE SUMMARY   DISCHARGE DIAGNOSES:  1. Methicillin-resistant Staphylococcus aureus buttock abscess.  2. Methicillin-resistant Staphylococcus aureus bacteremia.  3. Positive human immunodeficiency virus status.   CONSULTATIONS:  Dr. Orvan Falconer, infectious disease.   HISTORY:  Gabriel [name] is a 35 year old African American male who  presents in [facility] with a 2-day to 3-day history of  gradually increasing pain and swelling on his right buttock fairly near  the anus.  He complains of inability to have a bowel movement.  Today,  he also has fever, malaise, aching in his groin, thighs and lower  abdomen.  He denies any history of any similar problems.   PAST MEDICAL HISTORY:  He is followed for HIV/AIDS at the [facility] but denies opportunistic infections; no other serious  medical or surgical illnesses.   MEDICATIONS:  He had been prescribed antiviral medications but does not  know what they are and does not take them regularly.   ALLERGIES:  None.   SOCIAL HISTORY/FAMILY HISTORY/REVIEW OF SYSTEMS:  See detailed history  and physical.   PERTINENT EXAMINATION:  VITAL SIGNS:  Temperature was 101.9, blood  pressure 93/56.  IN GENERAL:  Thin, alert male in no acute distress.  BUTTOCKS:  Abnormal physical findings significant only for a 3-4 cm area  of tenderness, induration and fluctuance on the right buttock several cm  from the anus.   HOSPITAL COURSE:  Clinically, initially the patient was felt to have a  perirectal abscess.  He was started on broad-spectrum antibiotics, taken  to the operating room and  underwent incision and drainage.  He was  initially treated with IV Unasyn.  On the first postop day, he had a  temperature of 102.  Blood cultures were obtained.  His pain was  improved.  By November 27, 2008, operative cultures were showing a gram-  positive cocci, and he was started on IV vancomycin.  On November 28, 2008, wound and blood cultures returned positive for Staph aureus.  His  antibiotics were narrowed to IV vancomycin.  He remained with some low-  grade temperature, about 100.7.  Wound was improving on daily packing.  Infectious disease consult was obtained per Dr. Orvan Falconer who recommended  TEE and at least 2 weeks of IV vancomycin.  The patient continued to  steadily improve.  TEE was negative for vegetations.  He was felt ready  for discharge on December 03, 2008.  At this time, he was without pain,  afebrile x48 hours.  A PICC line was placed preoperatively, and Dr.  Orvan Falconer arranged for IV vancomycin as an outpatient.   MEDICATIONS:  Otherwise are the same as admission plus Vicodin for pain.   Followup is to be in my  office in 2 weeks.      Lorne Skeens. Hoxworth, M.D.  Electronically Signed     BTH/MEDQ  D:  01/05/2009  T:  01/05/2009  Job:  [id number]   cc:   Cliffton Asters, M.D.  Fax: [phone]   Pablo Lawrence. Philipp Deputy, M.D.  Fax: [phone]
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
## 2011-03-08 NOTE — Op Note (Signed)
Gabriel [name], Gabriel [name]                ACCOUNT NO.:  [id number]   MEDICAL RECORD NO.:  [id number]          PATIENT TYPE:  INP   LOCATION:  5034                         FACILITY:  [facility]   PHYSICIAN:  Dionne Ano. Gramig III, M.D.DATE OF BIRTH:  [birth year]   DATE OF PROCEDURE:  DATE OF DISCHARGE:                               OPERATIVE REPORT   PREOPERATIVE DIAGNOSIS:  Deep abscess, right hand and ring finger, with  associated early flexor tenosynovitis, purulent in nature.   POSTOPERATIVE DIAGNOSIS:  Deep abscess, right hand and ring finger, with  associated early flexor tenosynovitis, purulent in nature.   PROCEDURE:  I&D, deep abscess, right hand, as well as flexor sheath  region.   SURGEON:  Dionne Ano. Amanda Pea, MD   ASSISTANTS:  None.   COMPLICATIONS:  None.   ANESTHESIA:  General.   TOURNIQUET TIME:  Less than 30 minutes.   CULTURES:  Aerobic, anaerobic, and gonococcal taken.   INDICATIONS FOR PROCEDURE:  This patient is a 35 year old male who  presents with the above-mentioned diagnosis.  I have counseled him in  regards to risks and benefits of surgery including risk of infection,  bleeding, anesthesia, damage to normal structures, and failure of  surgery to accomplish its intended goals of relieving symptoms and  restoring function.  He has a positive exam and MRI indicative of a deep  abscess.  He has a history of HIV positive status and alternative  lifestyle with unusual activities and trauma to the hand.  I have  discussed with him the options and plans and he desires to proceed with  the above-mentioned surgical intervention understanding and accepting  the risks and benefits.   OPERATION:  The patient was administered anesthesia and taken to  operative suite, consent was signed.  A time-out was called.  Preoperative checklist was completed and he then underwent a general  anesthesia under the direction of Dr. Laverle Hobby.  Once under a  general anesthetic, the  patient had a thorough prep and drape about the  right upper extremity.  Following this, he had an incision made over the  volar aspect of the hand and ring finger.  Dissection was carried down  immediately and encountered a large amount of purulence.  This was  I&D'ed for aerobic, anaerobic, and gonococcal cultures.  The patient  tolerated the evacuation of thick, foul-smelling abscess well.  I then  identified the flexor sheath.  I opened the A3 pulley region.  There was  no frank pus, but certainly early tenosynovitis and early fluid  accumulation.  Prior to doing this, I did irrigate the area with 1 L of  saline of course to try not cross-contaminate.  The patient had some  early evidence of purulent tenosynovitis in flexor sheath and this was  irrigated copiously.  Three liters of saline were placed to the area and  the area was then packed with Iodoform gauze.  This was a large deep  abscess.   Following this, I then removed a large amount of de-epithelialized skin  tissue about the hand, palm, index, middle, and small finger  as well as  ring finger.  I then placed Adaptic followed by Neosporin and a sterile  wrap on the patient's hand.  He was then awoken from anesthesia and  taken to recovery room.  He will be monitored in the recovery room.  We  will continue vancomycin, ceftriaxone, and begin whirlpools tomorrow to  remove packing, begin water pick range of motion, and follow him along.  Hopefully, he will come to quiescent state of affairs without a  complicating feature.  I have discussed with him the relevant do's and  don't, etc., and all questions have been encouraged and answered.      Dionne Ano. Everlene Other, M.D.     Nash Mantis  D:  04/26/2008  T:  04/27/2008  Job:  [id number]

## 2011-03-08 NOTE — Discharge Summary (Signed)
Gabriel [name], Gabriel [name]                ACCOUNT NO.:  [id number]   MEDICAL RECORD NO.:  [id number]          [name] TYPE:  INP   LOCATION:  1610                         FACILITY:  [facility]   PHYSICIAN:  Madaline Guthrie, M.D.    DATE OF BIRTH:  Mar 07, 1976   DATE OF ADMISSION:  04/25/2008  DATE OF DISCHARGE:  04/30/2008                               DISCHARGE SUMMARY   DISCHARGE DIAGNOSIS:  Right hand abscess.   SECONDARY DIAGNOSES:  1. Human immunodeficiency virus with last CD4 count in December 2008      of 280.  2. Tobacco.   DISCHARGE MEDICATIONS:  1. Atripla 1 tablet p.o. daily.  2. Doxycycline 100 mg p.o. b.i.d.   PROCEDURES AND DIAGNOSTIC TESTS:  1. MRI of right upper extremity with results showing volar soft tissue      abscess overlying Gabriel proximal phalanx of Gabriel right ring finger.  2. I&D of Gabriel deep abscess of Gabriel right hand as well as flexor sheath      region.   CONSULTATIONS:  Dionne Ano. Gramig, MD   BRIEF ADMITTING HISTORY AND PHYSICAL:  Gabriel [name] is a 35 year old  African American male with past medical history significant for  polysubstance abuse and HIV, who represents with right hand pain, severe  on MCP joint from Gabriel third to fifth digits.  Gabriel area is exquisitely  tender, warm to touch, and it is very difficult for him to move.  Gabriel  [name] denies any trauma, insect bites, or burns to Gabriel area.  Gabriel pain  began approximately 2 days prior to admission.  It has been  progressively worse.  He comes to Gabriel ED this a.m. and was given IV  vancomycin and discharged with oral Keflex 250 q.i.d., Bactrim 800/160  b.i.d., and Vicodin.  Gabriel pain continued to worsen during Gabriel day and he  returned to Gabriel ED this evening.  Gabriel [name] has been afebrile but  reports subjective mild fevers.   ADMISSION VITAL SIGNS:  T-max 97.3, blood pressure 113/75, pulse 95, and  respirations 18.  Gabriel [name] is satting 100% on room air.   ADMISSION LABORATORY DATA:  Sodium 136, potassium  3.9, chloride 103,  bicarb 26, BUN 12, creatinine 1.3, and glucose 142.  White blood cell  5.9, hemoglobin 15.3, hematocrit 45.0, platelets 194, and ANC 4.3.   PHYSICAL EXAMINATION:  GENERAL:  Gabriel [name] is in no acute distress.  RESPIRATIONS:  Clear to auscultation bilaterally.  CARDIOVASCULAR:  Regular rate and rhythm.  No murmurs, rubs, or gallops.  EXTREMITIES:  Right hand swelling and tender to palpation around third  to fifth digits MCP that extends to Gabriel proximal phalanges and into Gabriel  distal palm.  Gabriel area is warm to touch.  No evidence of trauma or  puncture wounds.  No tenderness over tendons.  Motor status is 5/5  bilateral upper and lower extremity strength.  NEURO:  Cranial nerves II through XII grossly intact.  PSYCH:  Awake, alert, and oriented x3.   HOSPITAL COURSE:  1. Right hand abscess.  Gabriel [name] was admitted to a general floor  bed and Gabriel primary team continued to maintain Gabriel [name] on IV      ceftriaxone and vancomycin.  Dr. Dominica Severin, orthopedic      surgeon, was consulted on this [name].  He determined that Gabriel      [name] was in need of an I&D of Gabriel involved area.  On April 26, 2008, Gabriel [name] was taken to Gabriel OR for incision and drainage of      this deep abscess of Gabriel right hand.  Gabriel post-op diagnosis was as      follows:  Deep abscess right hand and ring finger with associated      early flexor tenosynovitis, purulent in nature.  There were no      complications.  Gabriel [name] tolerated Gabriel procedure well.  Gabriel      [name]'s pain was adequately controlled.  Postoperatively, he      remained on IV antibiotics while Gabriel team awaited cultures and      sensitivities.  Gabriel [name] received daily dressing changes and      appropriate occupational therapy.  Gabriel [name] also remained      afebrile and there was no associated spike in his white count.      Culture report came back, which probably grew out Staph aureus,      which was  sensitive to Gabriel tetracycline family.  Gabriel primary team      decided to send Gabriel [name] home on p.o. doxycycline.  Gabriel [name]      will also be following up with [facility]. [facility] for dressing      changes and also advised on occupational therapy to maintain good      range of motion and for full recovery.   1. Human immunodeficiency virus with recorded CD4 count of 280.  Gabriel      [name] was maintained on home medication of Atripla throughout      hospitalization.  Gabriel [name] will follow up with Dr. Philipp Deputy at      [facility].  Gabriel [name] has previously seen Dr. Philipp Deputy      but has been erratic with his followup.  Gabriel [name] was strongly      encouraged to attend all infectious disease followups and primary      team reiterated Gabriel importance for keeping these appointments.   DISCHARGE VITAL SIGNS:  Temperature 97.5, blood pressure 113/78, pulse  64, and respirations 16.   /DISPOSITION AND FOLLOWUP:  Gabriel [name] is being discharged from Gabriel  hospital on April 30, 2008 in stable and improved condition.  He has a  followup appointment with Dr. Philipp Deputy at [facility] on May 13, 2008 at 2:45.  At that time, he will need  evaluation of his current home medical regimen and possible assessment  of his current CD4 count and need for prophylactic therapy.   Gabriel [name] will also follow up with Gabriel office of Dr. Dominica Severin on  May 02, 2008 at 8 a.m. for dressing changes.   DISCHARGE LABS:  Sodium 136, potassium 3.8, chloride 103, bicarb 27, BUN  7, creatinine 1.05, and glucose 89.  White blood cells 5.3, hemoglobin  12.9, hematocrit 37.8, and platelets 182.      Genia Del, MD  Electronically Signed      Madaline Guthrie, M.D.  Electronically Signed    ZF/MEDQ  D:  05/01/2008  T:  05/02/2008  Job:  [id number]   cc:   Dionne Ano. Everlene Other, M.D.  Pablo Lawrence. Philipp Deputy, M.D.

## 2011-03-08 NOTE — H&P (Signed)
[name], [name]                ACCOUNT NO.:  [id number]   MEDICAL RECORD NO.:  [id number]          PATIENT TYPE:  INP   LOCATION:  1535                         FACILITY:  [facility]   PHYSICIAN:  Sharlet Salina T. Hoxworth, M.D.DATE OF BIRTH:  1976-06-05   DATE OF ADMISSION:  11/25/2008  DATE OF DISCHARGE:                              HISTORY & PHYSICAL   CHIEF COMPLAINT:  Rectal pain and constipation.   HISTORY OF PRESENT ILLNESS:  Gabriel [name] is a 35 year [name], African-  American male who presents to the [facility] emergency room with a 2-3  day history of gradually increasing pain and swelling adjacent to the  anus on the right side.  He has felt constipated and had inability to  have a bowel movement.  Today, he has also noted fever, some general  malaise, and there is achiness in his groins and thighs as well.  He has  no previous history of any similar illness, although states when he was  in [place], he had the episode of severe constipation, had a  colonoscopy, but apparently no surgical problem.   PAST MEDICAL HISTORY:  He is followed for HIV/AIDS at the [facility] but denies any history of opportunistic infections.  No other surgical or medical issues.   MEDICATIONS:  He has been prescribed medications for HIV, does not know  what they are and does not take them regularly.   ALLERGIES:  No known drug allergies.   SOCIAL HISTORY:  Single, unemployed, smokes a half-pack of cigarettes,  drinks alcohol occasionally, and has a history of cocaine abuse.   FAMILY HISTORY:  Noncontributory.   REVIEW OF SYSTEMS:  GENERAL:  Positive for malaise, fever with this  illness.  HEENT:  Denies vision, hearing, or swallowing problems.  RESPIRATORY:  No shortness of breath, cough, or wheezing.  CARDIAC:  States his chest has been aching somewhat with this illness.  No history  of heart disease.  No palpitations.  No swelling.  ABDOMEN/GI:  No  abdominal pain.   Constipated as above.  MUSCULOSKELETAL:  No joint pain.  No swelling.  NEUROLOGIC:  No numbness, weakness, syncope, or seizures.   PHYSICAL EXAMINATION:  VITAL SIGNS:  Temperature 101.9, pulse 98,  respirations 20, and blood pressure 93/56.  GENERAL:  He is a thin, alert, African American male in no acute  distress.  SKIN:  Warm and dry.  No rash or infection.  HEENT:  No palpable mass or thyromegaly.  Oropharynx is clear.  Sclerae  nonicteric.  LUNGS:  Clear without wheezing or increased work of breathing.  LYMPH NODES:  No cervical, supraclavicular, or inguinal nodes palpable.  CARDIAC:  Regular tachycardia.  No murmurs.  No edema.  ABDOMEN:  Soft and nontender.  No masses or organomegaly.  RECTAL:  In the right perirectal space is a 3-4 cm area of tenderness,  induration, and fluctuance.  EXTREMITIES:  No joint swelling or deformity.  NEUROLOGIC:  Alert and oriented.  Motor and sensory exam is grossly  normal.  Gait normal.   LABORATORY AND X-RAY:  Laboratory all pending.  EKG unremarkable.   IMPRESSION:  Perirectal abscess in a patient who is human  immunodeficiency virus-positive.  He has a history of depressed CD4  count.  This will require incision and drainage in the operating room  due to size and tenderness, and particularly with his immunodeficiency  this needs to be widely and actively drained.  He is being started on  broad-spectrum IV antibiotics.      Lorne Skeens. Hoxworth, M.D.  Electronically Signed     BTH/MEDQ  D:  11/25/2008  T:  11/26/2008  Job:  [id number]

## 2011-03-08 NOTE — Discharge Summary (Signed)
Gabriel [name], Gabriel [name]                ACCOUNT NO.:  [id number]   MEDICAL RECORD NO.:  [id number]          Gabriel [name] TYPE:  INP   LOCATION:  6741                         FACILITY:  [facility]   PHYSICIAN:  Alvester Morin, M.D.  DATE OF BIRTH:  1976/10/08   DATE OF ADMISSION:  01/04/2009  DATE OF DISCHARGE:  01/08/2009                               DISCHARGE SUMMARY   DISCHARGE DIAGNOSES:  1. Right lower extremity cellulitis.  Blood cultures negative for      organisms, improving on discharge.  2. Hypokalemia.  Unclear etiology.  Resolved on discharge.  3. Hypomagnesemia.  Repleted and resolved on discharge.   DISCHARGE MEDICATIONS:  1. Doxycycline 100 mg tablet twice daily x8 days.  2. Bactrim DS 1 tablet once daily prophylaxis.  3. Truvada.  Take 1 tablet once daily.  4. Novir.  Take 1 tablet once daily.  5. Prezista.  Take 2 tablets once daily.  6. Trazodone 50 mg.  Take 1 tablet at bedtime.  7. Vicodin 5/500 mg.  Take 1 tablet every 6 hours as needed for pain.  8. Chantx.  Take as prescribed.   DISPOSITION/FOLLOWUP:  Gabriel [name] was discharged from the unit in stable  condition with right lower extremity cellulitis improving, and Gabriel [name]  able to bear weight on his right extremity.  Gabriel [name] was provided  crutches on discharge to help with ambulation.  Gabriel [name] will follow up with  Dr. Philipp Deputy on January 16, 2009 at 10:30 a.m. in a follow-up appointment.  Please assess if right lower extremity cellulitis has resolved and is  not getting any worse.  Also evaluate if Gabriel [name] has completed  doxycycline antibiotics.  Please also assess compliance with the HIV  medications.  Gabriel [name] has a history of noncompliance per reviewed  records and unsure if Gabriel [name] is resistant to any of the medicines.  Gabriel [name] was  discharged on the same regimen that was in the records, Truvada, Norvir,  and Prezista.  We held those medications during the hospitalization, but  Gabriel [name] was told to start taking them on discharge, so follow  up on the  correct medicines for him.  In addition, please assess BMET, check for  hypokalemia if potassium levels are stable as well as magnesium levels.   PROCEDURES:  CT of the pelvis and right lower extremity with contrast.  Findings most consistent with cellulitis involving the right lower  extremity.  No evidence of focal abscess or drain in both fluid  collection.  Mildly prominent inguinal lymph nodes, right greater than  left.  Mildly prominent subcutaneous lymph node in medial right thigh  and likely reactive.   HISTORY OF PRESENT ILLNESS:  Gabriel [name] is a 35 year old male with HIV and  a CD4 count of 140 who presents to [facility] ED with worsening right  lower extremity pain and swelling that started 2 to 3 days prior to  admission, associated with erythema, chills, generalized weakness.  Gabriel [name] denied similar pain in his extremities, and this, per him, is  the worst one.  Gabriel [name] reports not coming to ED sooner because God  told me I will  die if I go to the ED.   Gabriel [name] denies fever, systemic concerns, vomiting, diarrhea, weakness,  confusion, sweating, diaphoresis, cough.  No sputum.  No sick contacts.  No trauma.   PHYSICAL EXAMINATION:  Temperature 99.1, blood pressure 112/67, pulse  102, respirations 20, saturation 99% on room air.   GENERAL:  Gabriel [name] in distress due to pain.  HEENT:  PERRLA.  EOMI.  No conjunctival pallor or muddy sclerae.  No  oropharyngeal erythema.  No thrush.  Good dentition.  NECK:  No palpable lymphadenopathy.  Full range of motion.  LUNGS:  Clear to auscultation bilaterally.  No rales or wheezing.  CARDIOVASCULAR:  Regular rhythm.  Tachycardic.  S1 and S2 present.  Grade 2 systolic ejection murmur.  No carotid bruits.  ABDOMEN:  Soft, nontender, nondistended.  Normal bowel sounds.  No  guarding.  EXTREMITIES:  Right lower extremity swelling.  Tenderness to palpation.  Erythema.  Inguinal lymphadenopathy on the right side.  GU:  No evidence of  perirectal abscess.  SKIN:  Erythema over the entire right lower extremity from groin to the  ankle.  No other rashes.  LYMPH:  Right inguinal lymphadenopathy.  MUSCULOSKELETAL:  No joint stiffness or effusions.  NEUROLOGIC:  Alert and oriented x3 with nonfocal neurologic exam.  PSYCH:  Anxious.  Racing thoughts.   Na 136, K 3, Cl 100, HCO3 29, BUN 9, Cr 1.12, Glu 100.  LFTs within normal limits.  Albumin 3.1, Ca 8.4.  WBC 8.3,  ANC 6.4, Hg 12.3, MCV 91, Plt 158.  CD4 count 140 on January 02, 2009.   HOSPITAL COURSE BY PROBLEM:  1. Right lower extremity swelling, consistent with cellulitis, per CT      scan an dclinical findings.  Gabriel [name] was started on vancomycin and      was discharged on doxycycline for a total of 10 days of treatment.      By hospital day #2, Gabriel [name] has clinically improved.  Swelling and      erythema resolving.  Gabriel [name] able to bear weight.  Was provided      crutches on discharge to help with ambulation.  In addition, blood      cultures were collected but were negative for organisms.  Gabriel [name]      will follow up in the outpatient clinic with Dr. Philipp Deputy.   1. Hypokalemia:  Possibly related to #1; however, unclear etiology.      Repleted with potassium.  Magnesium was low as well.  Magnesium was      repleted.  Both electrically stable on discharge.   VITALS ON DISCHARGE:  Temperature 97.9, blood pressure 107/81, pulse 61,  respirations 20, saturating at 98% on room air.   Na 142, K 3.7, Cl 109, HCO3 25, BUN 6, Cr 0.96, Glu 104.  WBCs 3.6, Hg 11.4, Plt 189.   Over 30 minutes was spent on discharging the Gabriel [name].      Mliss Sax, MD  Electronically Signed      Alvester Morin, M.D.  Electronically Signed    IM/MEDQ  D:  01/07/2009  T:  01/07/2009  Job:  [id number]   cc:   Dr. Karlene Lineman, outpatient clinic

## 2011-03-08 NOTE — Op Note (Signed)
NAMEAMY, BELLOSO                ACCOUNT NO.:  [id number]   MEDICAL RECORD NO.:  [id number]          PATIENT TYPE:  INP   LOCATION:  1535                         [facility]ACILITY[facility]  [facility]   PHYSICIAN:  Sharlet Salina T. Hoxworth, M.D.DATE OF BIRTH:  31-Jul-1976   DATE OF PROCEDURE:  11/25/2008  DATE OF DISCHARGE:                               OPERATIVE REPORT   POSTOPERATIVE DIAGNOSIS:  Perirectal abscess.   POSTOPERATIVE DIAGNOSIS:  Perirectal abscess.   PROCEDURE:  Incision and drainage of perirectal abscess.   SURGEON:  Dr. Johna Sheriff   ANESTHESIA:  General.   Mr. Iddings is a 35 year old male with history of HIV, who presents with  a 3-day history of gradually worsening right sided perirectal pain and  fever.  Examination shows a several centimeter tender perirectal abscess  in the right perirectal space.  I have recommended incision and drainage  under anesthesia.  The nature of the procedure, risks of bleeding,  infection, anesthetic complications were discussed and understood.  He  is now brought to the operating room for this procedure.   DESCRIPTION OF OPERATION:  The patient was brought up to the operating  room and placed in supine position on the operating table.  Laryngeal  mask general anesthesia was induced.  He was already on broad-spectrum  antibiotics.  He was carefully positioned and padded in lithotomy  position.  The perineum sterilely prepped and draped.  Correct patient  and procedure were verified.  I made an incision directly over the area  of fluctuance in the right posterior perirectal space and a several  centimeter abscess cavity was entered with purulent material.  This was  cultured.  All loculations were broken up and the cavity irrigated.  Anoscopy was performed.  Up to about 7-8 centimeters, the anoderm  appeared normal.  I did not see any evidence of a fistulous tract into  the rectum.  The cavity was packed with half-inch Iodoform gauze.  Sponge, needle and  instrument counts correct.  Dry sterile dressing was  applied.  The patient taken to recovery in good condition.      Lorne Skeens. Hoxworth, M.D.  Electronically Signed     BTH/MEDQ  D:  11/25/2008  T:  11/26/2008  Job:  [id number]

## 2011-03-08 NOTE — Cardiovascular Report (Signed)
NAMECRESCENCIO, JOZWIAK                ACCOUNT NO.:  [id number]   MEDICAL RECORD NO.:  [id number]          PATIENT TYPE:  INP   LOCATION:  5034                         FACILITY:  [facility]   PHYSICIAN:  Dionne Ano. Gramig III, M.D.DATE OF BIRTH:  07/10/1976   DATE OF PROCEDURE:  DATE OF DISCHARGE:  04/30/2008                            CARDIAC CATHETERIZATION   Gabriel [name] is a patient who was recently admitted to [facility].  He is an HIV-positive male as well as a severe hand  infection.  I have discussed with the patient these issues and my  significant concerns in regards to his overall health.  At the present  time, he is continuing to fight an infection in his hand and recover  from this.  I feel that he is going to have a prolonged recovery period,  at least 2-3 months.   In regards to his medical issues, he is an HIV-positive male and does  not have significant resources for his care.  I would strongly recommend  consideration for Medicare, so that he can obtain appropriate medicines  to aid him in his health affairs, so that he does dilapidate in terms of  function.   Many of our HIV-positive patients need proper strict and stringent  followup, so that they do not succumb to infections that are so common  and problematic for them.  Their main systems are certainly poor than  normal patients, and due to this, we recommend proper followup.  Without  resources, it is very difficult for this gentleman to obtain proper  followup and care, and thus I would once again recommend that he be  considered for medicated state at [facility].   I have discussed this with him and the Internal Medicine physicians in  regards to his upper extremity predicament and general health affairs.  I have discussed these issues with him and all parties.  I have noted  that it is going to be very difficult for him long term to maintain  proper health without good proper medical care and thus  once again  strongly recommend that he be considered.      Dionne Ano. Everlene Other, M.D.    ______________________________  Dionne Ano. Everlene Other, M.D.    Nash Mantis  D:  04/30/2008  T:  05/01/2008  Job:  [id number]

## 2011-03-28 ENCOUNTER — Other Ambulatory Visit: Payer: Self-pay

## 2011-03-31 ENCOUNTER — Other Ambulatory Visit (INDEPENDENT_AMBULATORY_CARE_PROVIDER_SITE_OTHER): Payer: Self-pay

## 2011-03-31 DIAGNOSIS — B20 Human immunodeficiency virus [HIV] disease: Secondary | ICD-10-CM

## 2011-03-31 DIAGNOSIS — Z79899 Other long term (current) drug therapy: Secondary | ICD-10-CM

## 2011-03-31 DIAGNOSIS — A539 Syphilis, unspecified: Secondary | ICD-10-CM

## 2011-03-31 LAB — COMPREHENSIVE METABOLIC PANEL
ALT: 13 U/L (ref 0–53)
Albumin: 4.5 g/dL (ref 3.5–5.2)
CO2: 24 mEq/L (ref 19–32)
Calcium: 9.1 mg/dL (ref 8.4–10.5)
Chloride: 104 mEq/L (ref 96–112)
Glucose, Bld: 81 mg/dL (ref 70–99)
Potassium: 4.1 mEq/L (ref 3.5–5.3)
Sodium: 139 mEq/L (ref 135–145)
Total Bilirubin: 0.2 mg/dL — ABNORMAL LOW (ref 0.3–1.2)
Total Protein: 7.6 g/dL (ref 6.0–8.3)

## 2011-03-31 LAB — LIPID PANEL: Cholesterol: 133 mg/dL (ref 0–200)

## 2011-03-31 LAB — CBC WITH DIFFERENTIAL/PLATELET
Eosinophils Absolute: 0.2 10*3/uL (ref 0.0–0.7)
Hemoglobin: 14.4 g/dL (ref 13.0–17.0)
Lymphocytes Relative: 43 % (ref 12–46)
Lymphs Abs: 1.7 10*3/uL (ref 0.7–4.0)
MCH: 33.3 pg (ref 26.0–34.0)
Monocytes Relative: 11 % (ref 3–12)
Neutrophils Relative %: 41 % — ABNORMAL LOW (ref 43–77)
RBC: 4.32 MIL/uL (ref 4.22–5.81)
WBC: 3.9 10*3/uL — ABNORMAL LOW (ref 4.0–10.5)

## 2011-04-01 LAB — T-HELPER CELL (CD4) - (RCID CLINIC ONLY)
CD4 % Helper T Cell: 21 % — ABNORMAL LOW (ref 33–55)
CD4 T Cell Abs: 360 uL — ABNORMAL LOW (ref 400–2700)

## 2011-04-01 LAB — HIV-1 RNA QUANT-NO REFLEX-BLD: HIV 1 RNA Quant: 39 copies/mL — ABNORMAL HIGH (ref <20)

## 2011-04-01 LAB — RPR TITER: RPR Titer: 1:8 {titer}

## 2011-04-01 LAB — RPR: RPR Ser Ql: REACTIVE — AB

## 2011-04-14 ENCOUNTER — Ambulatory Visit (INDEPENDENT_AMBULATORY_CARE_PROVIDER_SITE_OTHER): Payer: Self-pay | Admitting: Adult Health

## 2011-04-14 ENCOUNTER — Encounter: Payer: Self-pay | Admitting: Adult Health

## 2011-04-14 DIAGNOSIS — B20 Human immunodeficiency virus [HIV] disease: Secondary | ICD-10-CM

## 2011-04-14 DIAGNOSIS — A539 Syphilis, unspecified: Secondary | ICD-10-CM

## 2011-04-14 DIAGNOSIS — E785 Hyperlipidemia, unspecified: Secondary | ICD-10-CM

## 2011-04-14 MED ORDER — PENICILLIN G BENZATHINE 1200000 UNIT/2ML IM SUSP
1.2000 10*6.[IU] | Freq: Once | INTRAMUSCULAR | Status: AC
  Administered 2011-04-14: 1.2 10*6.[IU] via INTRAMUSCULAR

## 2011-04-14 NOTE — Patient Instructions (Signed)
Take enteric-coated fish oil capsules, 1000 mg, 2 capsules, by mouth, 3 times a day with meals.

## 2011-04-14 NOTE — Progress Notes (Signed)
Addended by: Lurlean Leyden on: 04/14/2011 04:26 PM   Modules accepted: Orders

## 2011-04-14 NOTE — Progress Notes (Signed)
  Subjective:    Patient ID: Gabriel [name], male    DOB: 1976-10-18, 35 y.o.   MRN: [id number]  HPI Presents to clinic for scheduled followup. Remains adherent to his antiretrovirals with tolerance and no complications. Voices no other new physical complaints except periodic episodes of muscle weakness to his legs during hot weather. Days.   Review of Systems  Constitutional: Negative.   HENT: Negative.   Eyes: Negative.   Respiratory: Negative.   Cardiovascular: Negative.   Gastrointestinal: Negative.   Genitourinary: Negative.   Musculoskeletal: Negative.   Skin: Negative.   Neurological: Negative.   Hematological: Negative.   Psychiatric/Behavioral: Negative.        Objective:   Physical Exam  Constitutional: He is oriented to person, place, and time. He appears well-developed and well-nourished. No distress.  HENT:  Head: Normocephalic and atraumatic.  Right Ear: External ear normal.  Left Ear: External ear normal.  Mouth/Throat: Oropharynx is clear and moist.  Eyes: Conjunctivae and EOM are normal. Pupils are equal, round, and reactive to light. Right eye exhibits no discharge. Left eye exhibits no discharge.  Neck: Normal range of motion. Neck supple.  Cardiovascular: Normal rate, regular rhythm, normal heart sounds and intact distal pulses.   Pulmonary/Chest: Effort normal and breath sounds normal.  Abdominal: Soft. Bowel sounds are normal.  Neurological: He is alert and oriented to person, place, and time. He has normal reflexes. No cranial nerve deficit. He exhibits normal muscle tone. Coordination normal.  Skin: Skin is warm and dry. No rash noted.  Psychiatric: He has a normal mood and affect. His behavior is normal. Judgment and thought content normal.          Assessment & Plan:  1. HIV. CD4 count 360 at 21% with a viral load of 39 copies/mL. Remarkable improvement over previous years. Recommend continuing present management, repeating labs in 10 weeks with a  followup in 3 months.  2. Syphilis. RPR titer. Remains 1:8, with T. pallidum antibodies greater than 8.00. Recommend 2.419 units benzathine penicillin IM times one, repeating titers and next blood draw and following the studies. Over time. No new.  3. Dyslipidemia. Although total cholesterol, was 133 fasting lipids show triglycerides of 34 0 mg/dL. At present, we should recommend starting enteric-coated fish oil capsules, 1000 mg 2 capsules, by mouth 3 times a day with meals and counseling on a carbohydrate restricted low-fat diets. If repeat lipid panels in 10 weeks. Do not show an improvement we should consider switching to Prezista/r therapy, to [place].  Verbally acknowledged all information that was provided to him and agreed with plan of care.

## 2011-06-16 DIAGNOSIS — Z113 Encounter for screening for infections with a predominantly sexual mode of transmission: Secondary | ICD-10-CM

## 2011-06-21 ENCOUNTER — Other Ambulatory Visit (INDEPENDENT_AMBULATORY_CARE_PROVIDER_SITE_OTHER): Payer: Self-pay

## 2011-06-21 ENCOUNTER — Ambulatory Visit: Payer: Self-pay

## 2011-06-21 ENCOUNTER — Other Ambulatory Visit: Payer: Self-pay | Admitting: Infectious Diseases

## 2011-06-21 DIAGNOSIS — E785 Hyperlipidemia, unspecified: Secondary | ICD-10-CM

## 2011-06-21 DIAGNOSIS — A539 Syphilis, unspecified: Secondary | ICD-10-CM

## 2011-06-21 DIAGNOSIS — B20 Human immunodeficiency virus [HIV] disease: Secondary | ICD-10-CM

## 2011-06-21 DIAGNOSIS — Z79899 Other long term (current) drug therapy: Secondary | ICD-10-CM

## 2011-06-21 LAB — CBC WITH DIFFERENTIAL/PLATELET
Basophils Absolute: 0 10*3/uL (ref 0.0–0.1)
Eosinophils Absolute: 0.1 10*3/uL (ref 0.0–0.7)
Eosinophils Relative: 1 % (ref 0–5)
Lymphocytes Relative: 26 % (ref 12–46)
Lymphs Abs: 1.2 10*3/uL (ref 0.7–4.0)
Neutrophils Relative %: 63 % (ref 43–77)
Platelets: 181 10*3/uL (ref 150–400)
RBC: 4.56 MIL/uL (ref 4.22–5.81)
RDW: 13.8 % (ref 11.5–15.5)
WBC: 4.8 10*3/uL (ref 4.0–10.5)

## 2011-06-21 LAB — LIPID PANEL
HDL: 48 mg/dL (ref >39)
Total CHOL/HDL Ratio: 3.1 Ratio
VLDL: 46 mg/dL — ABNORMAL HIGH (ref 0–40)

## 2011-06-21 LAB — COMPLETE METABOLIC PANEL WITH GFR
ALT: 16 U/L (ref 0–53)
AST: 29 U/L (ref 0–37)
CO2: 23 mEq/L (ref 19–32)
Chloride: 106 mEq/L (ref 96–112)
Creat: 0.94 mg/dL (ref 0.50–1.35)
GFR, Est African American: >60 mL/min (ref >60)
Sodium: 139 mEq/L (ref 135–145)
Total Bilirubin: 0.7 mg/dL (ref 0.3–1.2)
Total Protein: 7.4 g/dL (ref 6.0–8.3)

## 2011-06-21 LAB — RPR: RPR Ser Ql: REACTIVE — AB

## 2011-06-22 LAB — T-HELPER CELL (CD4) - (RCID CLINIC ONLY): CD4 T Cell Abs: 300 uL — ABNORMAL LOW (ref 400–2700)

## 2011-07-05 ENCOUNTER — Ambulatory Visit: Payer: Self-pay | Admitting: Adult Health

## 2011-07-21 LAB — CBC
HCT: 35.9 — ABNORMAL LOW
HCT: 37.8 — ABNORMAL LOW
HCT: 40.1
HCT: 42.5
Hemoglobin: 13.9
MCHC: 33.7
MCHC: 34.1
MCHC: 34.2
MCV: 92.9
MCV: 93.6
MCV: 93.9
MCV: 94.3
Platelets: 162
Platelets: 182
Platelets: 194
Platelets: 196
RDW: 14.1
RDW: 14.4
WBC: 3.2 — ABNORMAL LOW
WBC: 5.3
WBC: 5.5
WBC: 5.9

## 2011-07-21 LAB — DIFFERENTIAL
Basophils Absolute: 0
Basophils Relative: 0
Eosinophils Absolute: 0.1
Eosinophils Absolute: 0.2
Eosinophils Relative: 2
Eosinophils Relative: 3
Lymphocytes Relative: 24
Lymphocytes Relative: 24
Lymphs Abs: 0.8
Lymphs Abs: 1.3
Monocytes Absolute: 0.8
Monocytes Absolute: 0.8
Monocytes Relative: 12
Neutro Abs: 3.1

## 2011-07-21 LAB — BASIC METABOLIC PANEL
BUN: 7
BUN: 8
CO2: 27
CO2: 28
Chloride: 102
Chloride: 103
Creatinine, Ser: 0.95
GFR calc non Af Amer: >60
Glucose, Bld: 89
Potassium: 3.8
Potassium: 3.8
Sodium: 136

## 2011-07-21 LAB — GONOCOCCUS CULTURE

## 2011-07-21 LAB — CULTURE, BLOOD (ROUTINE X 2): Culture: NO GROWTH

## 2011-07-21 LAB — POCT I-STAT, CHEM 8
Creatinine, Ser: 1.3
Glucose, Bld: 142 — ABNORMAL HIGH
HCT: 45
Hemoglobin: 15.3
Sodium: 136
TCO2: 26

## 2011-07-21 LAB — COMPREHENSIVE METABOLIC PANEL
Albumin: 3.4 — ABNORMAL LOW
Albumin: 3.6
BUN: 10
BUN: 10
Calcium: 8.6
Calcium: 8.7
Chloride: 101
Creatinine, Ser: 1.05
Creatinine, Ser: 1.06
Glucose, Bld: 113 — ABNORMAL HIGH
Total Bilirubin: 0.6
Total Protein: 7.6

## 2011-07-21 LAB — ANAEROBIC CULTURE

## 2011-07-21 LAB — CREATININE, SERUM: GFR calc Af Amer: >60

## 2011-07-21 LAB — WOUND CULTURE

## 2011-07-21 LAB — RAPID URINE DRUG SCREEN, HOSP PERFORMED
Barbiturates: NOT DETECTED
Benzodiazepines: NOT DETECTED
Cocaine: POSITIVE — AB

## 2011-07-25 LAB — T-HELPER CELL (CD4) - (RCID CLINIC ONLY): CD4 % Helper T Cell: 16 — ABNORMAL LOW

## 2011-07-29 LAB — T-HELPER CELL (CD4) - (RCID CLINIC ONLY): CD4 T Cell Abs: 280 — ABNORMAL LOW

## 2011-09-13 ENCOUNTER — Telehealth: Payer: Self-pay | Admitting: *Deleted

## 2011-09-13 NOTE — Telephone Encounter (Signed)
Stating that he is ok to continue work. We have no appts open per Val at front desk for today or tomorrow. He does not have insurance other than the [employer] card. He is going to go to the ED

## 2011-09-13 NOTE — Telephone Encounter (Signed)
States he has a fever blister on his lip. It has not crusted over yet. He works at a [employer] that makes [employer] for [employer]. They are sending him home . He must have a letter from a doctor

## 2011-09-14 ENCOUNTER — Telehealth: Payer: Self-pay

## 2011-09-14 ENCOUNTER — Encounter (HOSPITAL_COMMUNITY): Payer: Self-pay

## 2011-09-14 ENCOUNTER — Emergency Department (HOSPITAL_COMMUNITY)
Admission: EM | Admit: 2011-09-14 | Discharge: 2011-09-14 | Disposition: A | Discharge status: Home / Self Care | Payer: Self-pay | Attending: Emergency Medicine | Admitting: Emergency Medicine

## 2011-09-14 DIAGNOSIS — R059 Cough, unspecified: Secondary | ICD-10-CM | POA: Insufficient documentation

## 2011-09-14 DIAGNOSIS — F17200 Nicotine dependence, unspecified, uncomplicated: Secondary | ICD-10-CM | POA: Insufficient documentation

## 2011-09-14 DIAGNOSIS — Z79899 Other long term (current) drug therapy: Secondary | ICD-10-CM | POA: Insufficient documentation

## 2011-09-14 DIAGNOSIS — Z21 Asymptomatic human immunodeficiency virus [HIV] infection status: Secondary | ICD-10-CM | POA: Insufficient documentation

## 2011-09-14 DIAGNOSIS — J069 Acute upper respiratory infection, unspecified: Secondary | ICD-10-CM | POA: Insufficient documentation

## 2011-09-14 DIAGNOSIS — R05 Cough: Secondary | ICD-10-CM | POA: Insufficient documentation

## 2011-09-14 DIAGNOSIS — B001 Herpesviral vesicular dermatitis: Secondary | ICD-10-CM

## 2011-09-14 DIAGNOSIS — B009 Herpesviral infection, unspecified: Secondary | ICD-10-CM | POA: Insufficient documentation

## 2011-09-14 HISTORY — DX: Asymptomatic human immunodeficiency virus (hiv) infection status: Z21

## 2011-09-14 HISTORY — DX: Methicillin resistant Staphylococcus aureus infection, unspecified site: A49.02

## 2011-09-14 HISTORY — DX: Cellulitis, unspecified: L03.90

## 2011-09-14 MED ORDER — BENZONATATE 100 MG PO CAPS
200.0000 mg | ORAL_CAPSULE | Freq: Once | ORAL | Status: AC
  Administered 2011-09-14: 200 mg via ORAL
  Filled 2011-09-14: qty 2

## 2011-09-14 MED ORDER — BENZONATATE 200 MG PO CAPS: 200.0000 mg | ORAL_CAPSULE | Freq: Once | ORAL | Status: AC

## 2011-09-14 MED ORDER — HYDROCOD POLST-CHLORPHEN POLST 10-8 MG/5ML PO LQCR: 5.0000 mL | Freq: Every evening | ORAL | Status: DC | PRN

## 2011-09-14 NOTE — ED Notes (Signed)
Pt needs medical clearance that his mrsa isnt active inorder to return to work

## 2011-09-14 NOTE — ED Notes (Signed)
Pt presents for a note for work that states "my MRSA is not active".  Pt reports 1 week h/o cold symptoms and fever blister.  Pt reports at work, he was told to get treatment for cold symptoms and fever blister so he could return to work.

## 2011-09-14 NOTE — Telephone Encounter (Signed)
[facility] called regarding patient's delivery / pick-up of medications - said they had left messages. I called him as well and got VM - left message that [facility] had been trying to reach him - also, gave him their phone # to call them.

## 2011-09-14 NOTE — ED Notes (Signed)
Patient given provided a drink.

## 2011-09-15 NOTE — ED Provider Notes (Signed)
History     CSN: [id number] Arrival date & time: 09/14/2011 11:11 AM   First MD Initiated Contact with Patient 09/14/11 1202      Chief Complaint  Patient presents with  . Medical Clearance    (Consider location/radiation/quality/duration/timing/severity/associated sxs/prior treatment) HPI The patient is a 35 yo HIV + male who presents to get a note for work.  He gets occasional fever blisters and has one over the left lower lip.  This is healing and does not have any signs of superinfection.  Patient has had recent URI symptoms but says these are all improving.  He states that his work requires a note stating that he does not have "active MRSA" for him to go back.  He called his own doctor and they could not see him.  He is here so he can get a note and go back to work.  Past Medical History  Diagnosis Date  . HIV positive   . MRSA infection   . Cellulitis     Past Surgical History  Procedure Date  . Hand / finger lesion excision     History reviewed. No pertinent family history.  History  Substance Use Topics  . Smoking status: Current Everyday Smoker -- 1.0 packs/day    Types: Cigarettes  . Smokeless tobacco: Never Used  . Alcohol Use: 1.5 oz/week    3 drink(s) per week      Review of Systems  Constitutional: Negative.   HENT: Negative.   Eyes: Negative.   Respiratory: Positive for cough.   Cardiovascular: Negative.   Gastrointestinal: Negative.   Genitourinary: Negative.   Musculoskeletal: Negative.   Skin:       See HPI  Neurological: Negative.   Hematological: Negative.   Psychiatric/Behavioral: Negative.   All other systems reviewed and are negative.    Allergies  Review of patient's allergies indicates no known allergies.  Home Medications   Current Outpatient Rx  Name Route Sig Dispense Refill  . DARUNAVIR ETHANOLATE 400 MG PO TABS Oral Take 800 mg by mouth daily.      Marland Kitchen EMTRICITABINE-TENOFOVIR 200-300 MG PO TABS Oral Take 1 tablet by mouth  daily.      Marland Kitchen RITONAVIR 100 MG PO CAPS Oral Take 100 mg by mouth daily.      . SULFAMETHOXAZOLE-TMP DS 800-160 MG PO TABS Oral Take 1 tablet by mouth daily.      Marland Kitchen BENZONATATE 200 MG PO CAPS Oral Take 1 capsule (200 mg total) by mouth once. 20 capsule 0  . HYDROCOD POLST-CHLORPHEN POLST 10-8 MG/5ML PO LQCR Oral Take 5 mLs by mouth at bedtime as needed. 140 mL 0    BP 101/64  Pulse 78  Temp(Src) 97.9 F (36.6 C) (Oral)  Resp 20  Ht 6\' 6"  (1.981 m)  Wt 190 lb (86.183 kg)  BMI 21.96 kg/m2  SpO2 97%  Physical Exam  Nursing note and vitals reviewed. Constitutional: He is oriented to person, place, and time. He appears well-developed and well-nourished. No distress.  HENT:  Head: Normocephalic and atraumatic.  Eyes: Conjunctivae and EOM are normal. Pupils are equal, round, and reactive to light.  Neck: Normal range of motion.  Cardiovascular: Normal rate, regular rhythm, normal heart sounds and intact distal pulses.  Exam reveals no gallop and no friction rub.   No murmur heard. Pulmonary/Chest: Effort normal and breath sounds normal. No respiratory distress. He has no wheezes. He has no rales.  Abdominal: Soft. Bowel sounds are normal. He exhibits no  distension. There is no tenderness. There is no rebound and no guarding.  Musculoskeletal: Normal range of motion.  Neurological: He is alert and oriented to person, place, and time. No cranial nerve deficit. He exhibits normal muscle tone. Coordination normal.  Skin: Skin is warm and dry.       Patient with a small 3 mm healing fever blister over the left lower lip.    Psychiatric: He has a normal mood and affect.    ED Course  Procedures (including critical care time)  Labs Reviewed - No data to display No results found.   1. Acute URI   2. Fever blister       MDM  Patient was here just for a note for work.  He had no concerning findings on exam.  Patient did have BP of 103/64.  Patient endorsed recent cough and requested  medication for this.  I discussed that given his medical history a CXR woulld be reasonable.  Patient did not wish to have this today.  He understands that he should return or see his doctor if his symptoms worsen.  He was discharged with cough medicines and a note for work for his healing and benign appearing skin lesion.        Cyndra Numbers, MD 09/15/11 [phone]

## 2011-11-14 ENCOUNTER — Other Ambulatory Visit: Payer: Self-pay | Admitting: *Deleted

## 2011-11-14 DIAGNOSIS — B20 Human immunodeficiency virus [HIV] disease: Secondary | ICD-10-CM

## 2011-11-14 MED ORDER — DARUNAVIR ETHANOLATE 800 MG PO TABS: 1.0000 | ORAL_TABLET | Freq: Every day | ORAL | Status: DC

## 2011-11-14 NOTE — Telephone Encounter (Signed)
Pt needs f/u appt.  Phone call to pt.  Message left to call [facility] to make f/u lab and provider appts.

## 2011-12-04 DIAGNOSIS — B353 Tinea pedis: Secondary | ICD-10-CM | POA: Insufficient documentation

## 2011-12-04 DIAGNOSIS — B351 Tinea unguium: Secondary | ICD-10-CM | POA: Insufficient documentation

## 2011-12-04 DIAGNOSIS — L853 Xerosis cutis: Secondary | ICD-10-CM | POA: Insufficient documentation

## 2011-12-04 NOTE — Assessment & Plan Note (Signed)
OTC Lac-Hydrin lotion to hands twice a day

## 2011-12-04 NOTE — Assessment & Plan Note (Signed)
Has been off his HIV medications for over 7 months as a result of: 1. Being away and 2. Having his ADAP lapse. His labs show marginal elevation in viral load, and we should obtain a genotype on this. We should also resume his therapy and have him return to clinic in 4 weeks for repeat labs with a followup in 6 weeks. This is following reestablishment of his ADAP with the eligibility coordinator. It is most likely that his genotype will be wild-type in nature, but important as he has been on and off therapy. In the past. Until we see a sustained elevation in CD4 count we should continue Bactrim PCP prophylaxis. For right now.

## 2011-12-04 NOTE — Progress Notes (Signed)
Subjective:    Patient ID: Gabriel [name] is a 36 y.o. male.  Chief Complaint: HIV Follow-up Visit Nikolis Berent is here for follow-up of HIV infection. He is feeling worse since his last visit.  He claims to been off his HIV medications for the past 7 months. As a result of being out of town and allowing. His ADAP lapse.  There are additional complaints. He complains of dry flaking, peeling skin to his hands, as well as thickened, yellow, nailbeds to the digits of his feet. Plus a, rash on his feet.  Data Review: Diagnostic studies reviewed.  Review of Systems - General ROS: negative for - fatigue, fever or malaise Psychological ROS: negative for - anxiety, behavioral disorder, concentration difficulties, depression or mood swings Ophthalmic ROS: negative ENT ROS: negative Endocrine ROS: negative Respiratory ROS: no cough, shortness of breath, or wheezing Cardiovascular ROS: no chest pain or dyspnea on exertion Gastrointestinal ROS: no abdominal pain, change in bowel habits, or black or bloody stools Genito-Urinary ROS: no dysuria, trouble voiding, or hematuria Musculoskeletal ROS: negative Neurological ROS: no TIA or stroke symptoms Dermatological ROS: positive for dry skin, nail changes and rash  Objective:   General appearance: alert, cooperative and no distress Head: Normocephalic, without obvious abnormality, atraumatic Eyes: conjunctivae/corneas clear. PERRL, EOM's intact. Fundi benign. Ears: normal TM's and external ear canals both ears Throat: lips, mucosa, and tongue normal; teeth and gums normal Resp: clear to auscultation bilaterally Cardio: regular rate and rhythm, S1, S2 normal, no murmur, click, rub or gallop GI: soft, non-tender; bowel sounds normal; no masses,  no organomegaly Extremities: extremities normal, atraumatic, no cyanosis or edema Pulses: 2+ and symmetric Skin: Thickened yellow nail beds to the digits of his feet, dry, irritated areas, between the digits  of his feet. Additionally, dry, flaking skin noted on palm and dorsal surface of hands. Neurologic: Alert and oriented X 3, normal strength and tone. Normal symmetric reflexes. Normal coordination and gait Psych:  No vegetative signs or delusional behaviors noted.    Laboratory: From 12/27/2010 ,  CD4 count was 300 c/cmm @ 25 %. Viral load 2230 copies/ml.     Assessment/Plan:   HIV DISEASE Has been off his HIV medications for over 7 months as a result of: 1. Being away and 2. Having his ADAP lapse. His labs show marginal elevation in viral load, and we should obtain a genotype on this. We should also resume his therapy and have him return to clinic in 4 weeks for repeat labs with a followup in 6 weeks. This is following reestablishment of his ADAP with the eligibility coordinator. It is most likely that his genotype will be wild-type in nature, but important as he has been on and off therapy. In the past. Until we see a sustained elevation in CD4 count we should continue Bactrim PCP prophylaxis. For right now.  Onychomycosis Terbinafine 250 mg by mouth daily, plus nystatin cream to be applied to both the foot and the nailbeds. Twice daily as instructed  Tinea pedis Plan as for that of onychomycosis.  Dry skin  OTC Lac-Hydrin lotion to hands twice a day     Tira Lafferty A. Sundra Aland, MS, [facility] [phone]  12/04/2011, 3:53 PM

## 2011-12-04 NOTE — Assessment & Plan Note (Signed)
Plan as for that of onychomycosis.

## 2011-12-04 NOTE — Assessment & Plan Note (Signed)
Terbinafine 250 mg by mouth daily, plus nystatin cream to be applied to both the foot and the nailbeds. Twice daily as instructed

## 2012-01-19 ENCOUNTER — Ambulatory Visit: Payer: Self-pay

## 2012-03-08 ENCOUNTER — Other Ambulatory Visit: Payer: Self-pay | Admitting: Internal Medicine

## 2012-03-08 DIAGNOSIS — Z79899 Other long term (current) drug therapy: Secondary | ICD-10-CM

## 2012-03-08 DIAGNOSIS — B20 Human immunodeficiency virus [HIV] disease: Secondary | ICD-10-CM

## 2012-03-08 DIAGNOSIS — Z113 Encounter for screening for infections with a predominantly sexual mode of transmission: Secondary | ICD-10-CM

## 2012-03-09 ENCOUNTER — Telehealth: Payer: Self-pay

## 2012-03-09 NOTE — Telephone Encounter (Signed)
Called patient and left message about renewing RW/ADAP - scheduled for him to stop by after lab on 03/13/12.

## 2012-03-13 ENCOUNTER — Other Ambulatory Visit: Payer: Self-pay

## 2012-03-13 ENCOUNTER — Ambulatory Visit: Payer: Self-pay

## 2012-03-28 ENCOUNTER — Telehealth: Payer: Self-pay | Admitting: *Deleted

## 2012-03-28 NOTE — Telephone Encounter (Signed)
Appt reminder left on pt's voice mail.

## 2012-03-29 ENCOUNTER — Ambulatory Visit: Payer: Self-pay | Admitting: Internal Medicine

## 2012-10-27 ENCOUNTER — Emergency Department: Payer: Self-pay | Admitting: Emergency Medicine

## 2012-10-27 LAB — RAPID INFLUENZA A&B ANTIGENS

## 2013-01-15 ENCOUNTER — Other Ambulatory Visit: Payer: Self-pay

## 2013-01-29 ENCOUNTER — Ambulatory Visit: Payer: Self-pay | Admitting: Internal Medicine

## 2013-04-18 ENCOUNTER — Ambulatory Visit: Payer: Self-pay | Admitting: Internal Medicine

## 2013-11-10 ENCOUNTER — Encounter (HOSPITAL_COMMUNITY): Payer: Self-pay | Admitting: Emergency Medicine

## 2013-11-10 ENCOUNTER — Emergency Department (HOSPITAL_COMMUNITY): Payer: BC Managed Care – PPO

## 2013-11-10 ENCOUNTER — Inpatient Hospital Stay (HOSPITAL_COMMUNITY)
Admission: EM | Admit: 2013-11-10 | Discharge: 2013-11-12 | DRG: 371 | Disposition: A | Discharge status: Home / Self Care | Payer: BC Managed Care – PPO | Attending: Internal Medicine | Admitting: Internal Medicine

## 2013-11-10 DIAGNOSIS — Z9114 Patient's other noncompliance with medication regimen: Secondary | ICD-10-CM

## 2013-11-10 DIAGNOSIS — F17200 Nicotine dependence, unspecified, uncomplicated: Secondary | ICD-10-CM | POA: Diagnosis present

## 2013-11-10 DIAGNOSIS — A530 Latent syphilis, unspecified as early or late: Secondary | ICD-10-CM | POA: Diagnosis present

## 2013-11-10 DIAGNOSIS — Z21 Asymptomatic human immunodeficiency virus [HIV] infection status: Secondary | ICD-10-CM

## 2013-11-10 DIAGNOSIS — E876 Hypokalemia: Secondary | ICD-10-CM | POA: Diagnosis present

## 2013-11-10 DIAGNOSIS — A071 Giardiasis [lambliasis]: Secondary | ICD-10-CM

## 2013-11-10 DIAGNOSIS — Z91199 Patient's noncompliance with other medical treatment and regimen due to unspecified reason: Secondary | ICD-10-CM

## 2013-11-10 DIAGNOSIS — R109 Unspecified abdominal pain: Secondary | ICD-10-CM

## 2013-11-10 DIAGNOSIS — D649 Anemia, unspecified: Secondary | ICD-10-CM | POA: Diagnosis present

## 2013-11-10 DIAGNOSIS — B20 Human immunodeficiency virus [HIV] disease: Secondary | ICD-10-CM | POA: Diagnosis present

## 2013-11-10 DIAGNOSIS — E785 Hyperlipidemia, unspecified: Secondary | ICD-10-CM | POA: Diagnosis present

## 2013-11-10 DIAGNOSIS — A539 Syphilis, unspecified: Secondary | ICD-10-CM

## 2013-11-10 DIAGNOSIS — E440 Moderate protein-calorie malnutrition: Secondary | ICD-10-CM | POA: Insufficient documentation

## 2013-11-10 DIAGNOSIS — R634 Abnormal weight loss: Secondary | ICD-10-CM

## 2013-11-10 DIAGNOSIS — Z9119 Patient's noncompliance with other medical treatment and regimen: Secondary | ICD-10-CM

## 2013-11-10 DIAGNOSIS — Z8614 Personal history of Methicillin resistant Staphylococcus aureus infection: Secondary | ICD-10-CM

## 2013-11-10 DIAGNOSIS — K567 Ileus, unspecified: Secondary | ICD-10-CM | POA: Diagnosis present

## 2013-11-10 DIAGNOSIS — E781 Pure hyperglyceridemia: Secondary | ICD-10-CM | POA: Diagnosis present

## 2013-11-10 DIAGNOSIS — R197 Diarrhea, unspecified: Secondary | ICD-10-CM | POA: Diagnosis present

## 2013-11-10 DIAGNOSIS — Z91148 Patient's other noncompliance with medication regimen for other reason: Secondary | ICD-10-CM

## 2013-11-10 LAB — COMPREHENSIVE METABOLIC PANEL
ALBUMIN: 3.5 g/dL (ref 3.5–5.2)
ALT: 13 U/L (ref 0–53)
AST: 16 U/L (ref 0–37)
Alkaline Phosphatase: 86 U/L (ref 39–117)
BUN: 14 mg/dL (ref 6–23)
CALCIUM: 8.6 mg/dL (ref 8.4–10.5)
CO2: 28 mEq/L (ref 19–32)
Chloride: 99 mEq/L (ref 96–112)
Creatinine, Ser: 0.91 mg/dL (ref 0.50–1.35)
GFR calc Af Amer: >90 mL/min (ref >90)
GFR calc non Af Amer: >90 mL/min (ref >90)
Glucose, Bld: 115 mg/dL — ABNORMAL HIGH (ref 70–99)
Potassium: 4.1 mEq/L (ref 3.7–5.3)
Sodium: 139 mEq/L (ref 137–147)
Total Bilirubin: 0.5 mg/dL (ref 0.3–1.2)
Total Protein: 8 g/dL (ref 6.0–8.3)

## 2013-11-10 LAB — CBC WITH DIFFERENTIAL/PLATELET
BASOS ABS: 0 10*3/uL (ref 0.0–0.1)
BASOS PCT: 1 % (ref 0–1)
EOS PCT: 1 % (ref 0–5)
Eosinophils Absolute: 0.1 10*3/uL (ref 0.0–0.7)
HEMATOCRIT: 41.7 % (ref 39.0–52.0)
HEMOGLOBIN: 14 g/dL (ref 13.0–17.0)
Lymphocytes Relative: 18 % (ref 12–46)
Lymphs Abs: 1.2 10*3/uL (ref 0.7–4.0)
MCH: 32 pg (ref 26.0–34.0)
MCHC: 33.6 g/dL (ref 30.0–36.0)
MCV: 95.2 fL (ref 78.0–100.0)
MONO ABS: 0.6 10*3/uL (ref 0.1–1.0)
MONOS PCT: 9 % (ref 3–12)
Neutro Abs: 4.8 10*3/uL (ref 1.7–7.7)
Neutrophils Relative %: 71 % (ref 43–77)
Platelets: 257 10*3/uL (ref 150–400)
RBC: 4.38 MIL/uL (ref 4.22–5.81)
RDW: 13.8 % (ref 11.5–15.5)
WBC: 6.7 10*3/uL (ref 4.0–10.5)

## 2013-11-10 LAB — URINALYSIS, ROUTINE W REFLEX MICROSCOPIC
Glucose, UA: NEGATIVE mg/dL
Hgb urine dipstick: NEGATIVE
KETONES UR: 15 mg/dL — AB
NITRITE: NEGATIVE
PH: 6 (ref 5.0–8.0)
Protein, ur: NEGATIVE mg/dL
Specific Gravity, Urine: 1.034 — ABNORMAL HIGH (ref 1.005–1.030)
UROBILINOGEN UA: 1 mg/dL (ref 0.0–1.0)

## 2013-11-10 LAB — URINE MICROSCOPIC-ADD ON

## 2013-11-10 LAB — CLOSTRIDIUM DIFFICILE BY PCR: Toxigenic C. Difficile by PCR: NEGATIVE

## 2013-11-10 LAB — LIPASE, BLOOD: LIPASE: 24 U/L (ref 11–59)

## 2013-11-10 LAB — TROPONIN I: Troponin I: <0.3 ng/mL (ref <0.30)

## 2013-11-10 MED ORDER — SODIUM CHLORIDE 0.9 % IV SOLN
INTRAVENOUS | Status: DC
  Administered 2013-11-10 – 2013-11-11 (×2): via INTRAVENOUS

## 2013-11-10 MED ORDER — CHLORHEXIDINE GLUCONATE 0.12 % MT SOLN
15.0000 mL | Freq: Two times a day (BID) | OROMUCOSAL | Status: DC
  Administered 2013-11-10 – 2013-11-12 (×3): 15 mL via OROMUCOSAL
  Filled 2013-11-10 (×2): qty 15

## 2013-11-10 MED ORDER — SODIUM CHLORIDE 0.9 % IV BOLUS (SEPSIS): 1000.0000 mL | Freq: Once | INTRAVENOUS | Status: DC

## 2013-11-10 MED ORDER — ONDANSETRON HCL 4 MG PO TABS: 4.0000 mg | ORAL_TABLET | Freq: Four times a day (QID) | ORAL | Status: DC | PRN

## 2013-11-10 MED ORDER — SULFAMETHOXAZOLE-TMP DS 800-160 MG PO TABS
1.0000 | ORAL_TABLET | Freq: Every day | ORAL | Status: DC
  Administered 2013-11-10 – 2013-11-12 (×3): 1 via ORAL
  Filled 2013-11-10 (×3): qty 1

## 2013-11-10 MED ORDER — DARUNAVIR ETHANOLATE 800 MG PO TABS
800.0000 mg | ORAL_TABLET | Freq: Every day | ORAL | Status: DC
  Filled 2013-11-10: qty 1

## 2013-11-10 MED ORDER — SODIUM CHLORIDE 0.9 % IV BOLUS (SEPSIS)
1000.0000 mL | Freq: Once | INTRAVENOUS | Status: AC
  Administered 2013-11-10: 1000 mL via INTRAVENOUS

## 2013-11-10 MED ORDER — RITONAVIR 100 MG PO CAPS
100.0000 mg | ORAL_CAPSULE | Freq: Every day | ORAL | Status: DC
  Filled 2013-11-10: qty 1

## 2013-11-10 MED ORDER — LIDOCAINE VISCOUS 2 % MT SOLN
15.0000 mL | Freq: Once | OROMUCOSAL | Status: AC
  Administered 2013-11-10: 15 mL via OROMUCOSAL
  Filled 2013-11-10: qty 15

## 2013-11-10 MED ORDER — IOHEXOL 300 MG/ML  SOLN
25.0000 mL | Freq: Once | INTRAMUSCULAR | Status: AC | PRN
  Administered 2013-11-10: 25 mL via ORAL

## 2013-11-10 MED ORDER — IOHEXOL 300 MG/ML  SOLN
100.0000 mL | Freq: Once | INTRAMUSCULAR | Status: AC | PRN
  Administered 2013-11-10: 100 mL via INTRAVENOUS

## 2013-11-10 MED ORDER — ENOXAPARIN SODIUM 40 MG/0.4ML ~~LOC~~ SOLN
40.0000 mg | SUBCUTANEOUS | Status: DC
  Filled 2013-11-10 (×3): qty 0.4

## 2013-11-10 MED ORDER — EMTRICITABINE-TENOFOVIR DF 200-300 MG PO TABS
1.0000 | ORAL_TABLET | Freq: Every day | ORAL | Status: DC
  Administered 2013-11-10 – 2013-11-12 (×3): 1 via ORAL
  Filled 2013-11-10 (×3): qty 1

## 2013-11-10 MED ORDER — EMTRICITABINE-TENOFOVIR DF 200-300 MG PO TABS
1.0000 | ORAL_TABLET | Freq: Every day | ORAL | Status: DC
  Filled 2013-11-10: qty 1

## 2013-11-10 MED ORDER — BIOTENE DRY MOUTH MT LIQD
15.0000 mL | Freq: Two times a day (BID) | OROMUCOSAL | Status: DC
  Administered 2013-11-11: 15 mL via OROMUCOSAL

## 2013-11-10 MED ORDER — NICOTINE 21 MG/24HR TD PT24
21.0000 mg | MEDICATED_PATCH | Freq: Every day | TRANSDERMAL | Status: DC
  Administered 2013-11-10 – 2013-11-12 (×3): 21 mg via TRANSDERMAL
  Filled 2013-11-10 (×3): qty 1

## 2013-11-10 MED ORDER — ACETAMINOPHEN 325 MG PO TABS: 650.0000 mg | ORAL_TABLET | Freq: Four times a day (QID) | ORAL | Status: DC | PRN

## 2013-11-10 MED ORDER — ACETAMINOPHEN 650 MG RE SUPP: 650.0000 mg | Freq: Four times a day (QID) | RECTAL | Status: DC | PRN

## 2013-11-10 MED ORDER — ONDANSETRON HCL 4 MG/2ML IJ SOLN: 4.0000 mg | Freq: Four times a day (QID) | INTRAMUSCULAR | Status: DC | PRN

## 2013-11-10 MED ORDER — RITONAVIR 100 MG PO CAPS
100.0000 mg | ORAL_CAPSULE | Freq: Every day | ORAL | Status: DC
  Administered 2013-11-11 – 2013-11-12 (×3): 100 mg via ORAL
  Filled 2013-11-10 (×4): qty 1

## 2013-11-10 MED ORDER — OXYMETAZOLINE HCL 0.05 % NA SOLN
1.0000 | Freq: Once | NASAL | Status: AC
  Administered 2013-11-10: 1 via NASAL
  Filled 2013-11-10: qty 15

## 2013-11-10 MED ORDER — DARUNAVIR ETHANOLATE 800 MG PO TABS
800.0000 mg | ORAL_TABLET | Freq: Every day | ORAL | Status: DC
  Administered 2013-11-11 – 2013-11-12 (×3): 800 mg via ORAL
  Filled 2013-11-10 (×3): qty 1

## 2013-11-10 NOTE — ED Notes (Signed)
Pt aware of need for stool.

## 2013-11-10 NOTE — ED Provider Notes (Signed)
CSN: [id number]     Arrival date & time 11/10/13  0848 History   First MD Initiated Contact with Patient 11/10/13 [phone]     Chief Complaint  Patient presents with  . Diarrhea   (Consider location/radiation/quality/duration/timing/severity/associated sxs/prior Treatment) The history is provided by the patient. No language interpreter was used.  Gabriel [name] is a 38 year old male with past medical history of MRSA, cellulitis, HIV disease presenting to emergency department with diarrhea that has been ongoing for the past 3 weeks. Patient reports that he's been having at least 3 bowel movements per hour that is of loose stool. Patient reported that last night had a bowel movement on himself due to being unable to get to the bathroom in enough time. Patient reports he's been experiencing intermittent abdominal cramping and soreness. Reported that he's been having intermittent chills. States that he's been feeling tired more so than normal. Reported that this morning he felt nauseous and was sweating-patient reported that he was on his way to visit a family member in [place], reported that he felt like he was going to "pass out"-denied syncopal episode. Stated that he had mild dizziness that lasted only a couple minutes. Reported that he's been coughing up mucus for the past week. Reported intermittent nasal congestion. Denied vomiting, chest pain, shortness of breath, difficulty breathing, changes to eating habits, decreased drinking, urinary symptoms, fever, melena, hematochezia. Patient has not been to infectious disease physician, Dr. Luciana Axeomer, since 2012. When asked if patient is taking his HIV medication daily as prescribed patient reports that he is not-reported that he takes it every once in a while. When asked why he does not take his medication on a daily basis he reports that he just does not want to take the medications. Patient reported that he has an appointment with Infectious Disease on Tuesday,  11/12/2013. PCP none  Past Medical History  Diagnosis Date  . HIV positive   . MRSA infection   . Cellulitis    Past Surgical History  Procedure Laterality Date  . Hand / finger lesion excision     No family history on file. History  Substance Use Topics  . Smoking status: Current Every Day Smoker -- 1.00 packs/day    Types: Cigarettes  . Smokeless tobacco: Never Used  . Alcohol Use: 1.5 oz/week    3 drink(s) per week    Review of Systems  Constitutional: Negative for fever and chills.  Respiratory: Positive for cough. Negative for chest tightness and shortness of breath.   Cardiovascular: Negative for chest pain.  Gastrointestinal: Positive for nausea, abdominal pain and diarrhea. Negative for vomiting, constipation, blood in stool and anal bleeding.  Genitourinary: Negative for decreased urine volume.  Musculoskeletal: Negative for back pain.  Neurological: Positive for dizziness. Negative for weakness and headaches.  All other systems reviewed and are negative.    Allergies  Review of patient's allergies indicates no known allergies.  Home Medications   Current Outpatient Rx  Name  Route  Sig  Dispense  Refill  . Darunavir Ethanolate (PREZISTA) 800 MG TABS   Oral   Take 1 tablet by mouth daily with breakfast.   30 tablet   3   . emtricitabine-tenofovir (TRUVADA) 200-300 MG per tablet   Oral   Take 1 tablet by mouth daily.           . ritonavir (NORVIR) 100 MG capsule   Oral   Take 100 mg by mouth daily.           .Marland Kitchen  sulfamethoxazole-trimethoprim (BACTRIM DS) 800-160 MG per tablet   Oral   Take 1 tablet by mouth daily.            BP 109/71  Pulse 67  Temp(Src) 98.3 F (36.8 C) (Oral)  Resp 14  Ht 6\' 6"  (1.981 m)  Wt 185 lb (83.915 kg)  BMI 21.38 kg/m2  SpO2 100% Physical Exam  Nursing note and vitals reviewed. Constitutional: He is oriented to person, place, and time. He appears well-developed and well-nourished. No distress.  HENT:   Head: Normocephalic and atraumatic.  Mouth/Throat: Oropharynx is clear and moist. No oropharyngeal exudate.  Eyes: Conjunctivae and EOM are normal. Pupils are equal, round, and reactive to light. Right eye exhibits no discharge. Left eye exhibits no discharge.  Neck: Normal range of motion. No tracheal deviation present.  Negative neck stiffness Negative nuchal rigidity Negative cervical lymphadenopathy Negative meningeal signs  Cardiovascular: Normal rate, regular rhythm and normal heart sounds.  Exam reveals no friction rub.   No murmur heard. Pulses:      Radial pulses are 2+ on the right side, and 2+ on the left side.       Dorsalis pedis pulses are 2+ on the right side, and 2+ on the left side.  Negative leg swelling bilaterally  Negative pitting edema identified Cap refill less than 3 seconds  Pulmonary/Chest: Effort normal and breath sounds normal. No respiratory distress. He has no wheezes. He has no rales.  Abdominal: Soft. Normal appearance and bowel sounds are normal. There is generalized tenderness. There is guarding.  Diffuse tenderness upon palpation to the abdomen, generalized  Musculoskeletal: Normal range of motion. He exhibits no tenderness.  Full ROM to upper and lower extremities without difficulty noted, negative ataxia noted.  Lymphadenopathy:    He has no cervical adenopathy.  Neurological: He is alert and oriented to person, place, and time. He exhibits normal muscle tone. Coordination normal.  Cranial nerves III-XII grossly intact Strength 5+/5+ to upper and lower extremities bilaterally with resistance applied, equal distribution noted Gait proper, proper balance - negative sway, negative drift, negative step-offs  Skin: Skin is warm and dry. No rash noted. He is not diaphoretic. No erythema.  Psychiatric: He has a normal mood and affect. His behavior is normal. Thought content normal.    ED Course  Procedures (including critical care time)  This provider  reviewed patient's chart. Labs last collected on 06/21/2011. CD4 count was recorded as being 300. HIV RNA titer was reported to be 50. RPR was reported to be reactive.  12:57 PM This provider spoke with Dr. Gwen Her [facility] [address] - Infectious Disease physician - discussed case, history, presentation, labs. Physician reported that patient has not been to the clinic since 2012. Reported that patient does not have an appointment with infectious disease on Tuesday. Physician recommended HIV-1 RNA ultraquant reflex to gentyp+, AFB blood cultures, and CD4 count to be performed.   1:51 PM This provider spoke with Dr. Marilu Favre, Internal Medicine Teaching Services Resident - discussed case, presentation, history, labs, imaging. Patient to be admitted to internal medicine teaching services, MedSurg patient-inpatient. Recommended NG tube to be placed.  Results for orders placed during the hospital encounter of 11/10/13  CLOSTRIDIUM DIFFICILE BY PCR      Result Value Range   C difficile by pcr NEGATIVE  NEGATIVE  CBC WITH DIFFERENTIAL      Result Value Range   WBC 6.7  4.0 - 10.5 K/uL   RBC 4.38  4.22 - 5.81  MIL/uL   Hemoglobin 14.0  13.0 - 17.0 g/dL   HCT 16.1  09.6 - 04.5 %   MCV 95.2  78.0 - 100.0 fL   MCH 32.0  26.0 - 34.0 pg   MCHC 33.6  30.0 - 36.0 g/dL   RDW 40.9  81.1 - 91.4 %   Platelets 257  150 - 400 K/uL   Neutrophils Relative % 71  43 - 77 %   Neutro Abs 4.8  1.7 - 7.7 K/uL   Lymphocytes Relative 18  12 - 46 %   Lymphs Abs 1.2  0.7 - 4.0 K/uL   Monocytes Relative 9  3 - 12 %   Monocytes Absolute 0.6  0.1 - 1.0 K/uL   Eosinophils Relative 1  0 - 5 %   Eosinophils Absolute 0.1  0.0 - 0.7 K/uL   Basophils Relative 1  0 - 1 %   Basophils Absolute 0.0  0.0 - 0.1 K/uL  COMPREHENSIVE METABOLIC PANEL      Result Value Range   Sodium 139  137 - 147 mEq/L   Potassium 4.1  3.7 - 5.3 mEq/L   Chloride 99  96 - 112 mEq/L   CO2 28  19 - 32 mEq/L   Glucose, Bld 115 (*) 70 - 99 mg/dL   BUN 14  6 - 23  mg/dL   Creatinine, Ser 7.82  0.50 - 1.35 mg/dL   Calcium 8.6  8.4 - 95.6 mg/dL   Total Protein 8.0  6.0 - 8.3 g/dL   Albumin 3.5  3.5 - 5.2 g/dL   AST 16  0 - 37 U/L   ALT 13  0 - 53 U/L   Alkaline Phosphatase 86  39 - 117 U/L   Total Bilirubin 0.5  0.3 - 1.2 mg/dL   GFR calc non Af Amer >90  >90 mL/min   GFR calc Af Amer >90  >90 mL/min  LIPASE, BLOOD      Result Value Range   Lipase 24  11 - 59 U/L  TROPONIN I      Result Value Range   Troponin I <0.30  <0.30 ng/mL  URINALYSIS, ROUTINE W REFLEX MICROSCOPIC      Result Value Range   Color, Urine AMBER (*) YELLOW   APPearance CLOUDY (*) CLEAR   Specific Gravity, Urine 1.034 (*) 1.005 - 1.030   pH 6.0  5.0 - 8.0   Glucose, UA NEGATIVE  NEGATIVE mg/dL   Hgb urine dipstick NEGATIVE  NEGATIVE   Bilirubin Urine SMALL (*) NEGATIVE   Ketones, ur 15 (*) NEGATIVE mg/dL   Protein, ur NEGATIVE  NEGATIVE mg/dL   Urobilinogen, UA 1.0  0.0 - 1.0 mg/dL   Nitrite NEGATIVE  NEGATIVE   Leukocytes, UA SMALL (*) NEGATIVE  URINE MICROSCOPIC-ADD ON      Result Value Range   Squamous Epithelial / LPF RARE  RARE   WBC, UA 3-6  <3 WBC/hpf   Urine-Other MUCOUS PRESENT     Dg Chest 1 View  11/10/2013   CLINICAL DATA:  Possible pulmonary nodule  EXAM: CHEST - 1 VIEW  COMPARISON:  Study performed earlier today  FINDINGS: Lungs are clear. Heart size normal. Nipple markers indicate that the nodular density seen on the prior study represented a nipple shadow.  IMPRESSION: No pulmonary nodules.   Electronically Signed   By: Esperanza Heir M.D.   On: 11/10/2013 13:01   Dg Chest 2 View  11/10/2013   CLINICAL DATA:  Cough  for 3 weeks with diarrhea  EXAM: CHEST  2 VIEW  COMPARISON:  12/16/2009  FINDINGS: Hyperinflation suggests COPD. Heart size and vascular pattern are normal. 1 cm nodular opacity over the right mid lung zone. On the left side, there are no abnormal opacities.  IMPRESSION: No evidence of pneumonia.  COPD.  Recommend repeating the PA view with  nipple markers to confirm that the nodular density represents a nipple shadow.   Electronically Signed   By: Esperanza Heir M.D.   On: 11/10/2013 10:46   Ct Abdomen Pelvis W Contrast  11/10/2013   CLINICAL DATA:  Three-week history of crampy abdominal pain and diarrhea, history of HIV and MR as a  EXAM: CT ABDOMEN AND PELVIS WITH CONTRAST  TECHNIQUE: Multidetector CT imaging of the abdomen and pelvis was performed using the standard protocol following bolus administration of intravenous contrast.  CONTRAST:  OMNIPAQUE IOHEXOL 300 MG/ML SOLN, the patient did receive oral contrast material.  COMPARISON:  CT scan of the abdomen and pelvis dated December 17, 2009.  FINDINGS: The liver exhibits no focal mass nor ductal dilation. The gallbladder is adequately distended with no evidence of stones or surrounding inflammatory changes. The pancreas, spleen, nondistended stomach, adrenal glands, and kidneys exhibit no acute abnormality. The caliber of the abdominal aorta is normal. There is no periaortic nor pericaval lymphadenopathy.  The orally administered contrast has traversed the small bowel and reached as far as the splenic flexure of the colon. There is mild distention of small-bowel loops in the mid and upper abdomen. In the left mid abdomen on images 31 through 41 there is a configuration of unopacified small bowel loops that may reflect intussusception. No edematous bowel loops are demonstrated.  Contrast is present within the colon as far as the splenic flexure and it appears normal. A normal calibered, contrast-filled appendix is demonstrated. There is no evidence of colitis or diverticulitis. The nondistended portion of the colon exhibits no suspicious findings. There is a moderate amount gas and some stool in the rectum.  The partially distended urinary bladder is normal in appearance. The prostate gland produces a mild impression upon the urinary bladder base. There is no evidence of ascites. There is  no inguinal nor umbilical hernia. The lung bases are clear. The lumbar vertebral bodies are preserved in height. The lung bases are clear.  IMPRESSION: 1. The appearance of the proximal small bowel may reflect mild obstruction or ileus secondary to what appears to be an area of intussusception or possible internal hernia in the left mid abdomen. There is no complete obstruction since contrast has proceeded is far distally as the splenic flexure of the colon. 2. There is no evidence of inflammation of the colon. There is no acute appendicitis or evidence of diverticulitis. 3. There is no evidence of acute hepatobiliary or acute urinary tract abnormality. 4. There is no evidence of ascites. No intra-abdominal or pelvic lymphadenopathy is demonstrated.   Electronically Signed   By: David  [place]   On: 11/10/2013 13:00   Labs Review Labs Reviewed  COMPREHENSIVE METABOLIC PANEL - Abnormal; Notable for the following:    Glucose, Bld 115 (*)    All other components within normal limits  URINALYSIS, ROUTINE W REFLEX MICROSCOPIC - Abnormal; Notable for the following:    Color, Urine AMBER (*)    APPearance CLOUDY (*)    Specific Gravity, Urine 1.034 (*)    Bilirubin Urine SMALL (*)    Ketones, ur 15 (*)  Leukocytes, UA SMALL (*)    All other components within normal limits  CLOSTRIDIUM DIFFICILE BY PCR  STOOL CULTURE  AFB CULTURE, BLOOD  CBC WITH DIFFERENTIAL  LIPASE, BLOOD  TROPONIN I  URINE MICROSCOPIC-ADD ON  HIV-1 RNA ULTRAQUANT REFLEX TO GENTYP+  CD4/CD8 (T-HELPER/T-SUPPRESSOR CELL)  T-HELPER CELLS (CD4) COUNT  GI PATHOGEN PANEL BY PCR, STOOL   Imaging Review Dg Chest 1 View  11/10/2013   CLINICAL DATA:  Possible pulmonary nodule  EXAM: CHEST - 1 VIEW  COMPARISON:  Study performed earlier today  FINDINGS: Lungs are clear. Heart size normal. Nipple markers indicate that the nodular density seen on the prior study represented a nipple shadow.  IMPRESSION: No pulmonary nodules.    Electronically Signed   By: Esperanza Heir M.D.   On: 11/10/2013 13:01   Dg Chest 2 View  11/10/2013   CLINICAL DATA:  Cough for 3 weeks with diarrhea  EXAM: CHEST  2 VIEW  COMPARISON:  12/16/2009  FINDINGS: Hyperinflation suggests COPD. Heart size and vascular pattern are normal. 1 cm nodular opacity over the right mid lung zone. On the left side, there are no abnormal opacities.  IMPRESSION: No evidence of pneumonia.  COPD.  Recommend repeating the PA view with nipple markers to confirm that the nodular density represents a nipple shadow.   Electronically Signed   By: Esperanza Heir M.D.   On: 11/10/2013 10:46   Ct Abdomen Pelvis W Contrast  11/10/2013   CLINICAL DATA:  Three-week history of crampy abdominal pain and diarrhea, history of HIV and MR as a  EXAM: CT ABDOMEN AND PELVIS WITH CONTRAST  TECHNIQUE: Multidetector CT imaging of the abdomen and pelvis was performed using the standard protocol following bolus administration of intravenous contrast.  CONTRAST:  OMNIPAQUE IOHEXOL 300 MG/ML SOLN, the patient did receive oral contrast material.  COMPARISON:  CT scan of the abdomen and pelvis dated December 17, 2009.  FINDINGS: The liver exhibits no focal mass nor ductal dilation. The gallbladder is adequately distended with no evidence of stones or surrounding inflammatory changes. The pancreas, spleen, nondistended stomach, adrenal glands, and kidneys exhibit no acute abnormality. The caliber of the abdominal aorta is normal. There is no periaortic nor pericaval lymphadenopathy.  The orally administered contrast has traversed the small bowel and reached as far as the splenic flexure of the colon. There is mild distention of small-bowel loops in the mid and upper abdomen. In the left mid abdomen on images 31 through 41 there is a configuration of unopacified small bowel loops that may reflect intussusception. No edematous bowel loops are demonstrated.  Contrast is present within the colon as far as  the splenic flexure and it appears normal. A normal calibered, contrast-filled appendix is demonstrated. There is no evidence of colitis or diverticulitis. The nondistended portion of the colon exhibits no suspicious findings. There is a moderate amount gas and some stool in the rectum.  The partially distended urinary bladder is normal in appearance. The prostate gland produces a mild impression upon the urinary bladder base. There is no evidence of ascites. There is no inguinal nor umbilical hernia. The lung bases are clear. The lumbar vertebral bodies are preserved in height. The lung bases are clear.  IMPRESSION: 1. The appearance of the proximal small bowel may reflect mild obstruction or ileus secondary to what appears to be an area of intussusception or possible internal hernia in the left mid abdomen. There is no complete obstruction since contrast has proceeded is  far distally as the splenic flexure of the colon. 2. There is no evidence of inflammation of the colon. There is no acute appendicitis or evidence of diverticulitis. 3. There is no evidence of acute hepatobiliary or acute urinary tract abnormality. 4. There is no evidence of ascites. No intra-abdominal or pelvic lymphadenopathy is demonstrated.   Electronically Signed   By: David  [place]   On: 11/10/2013 13:00    EKG Interpretation   None       MDM   1. Ileus   2. Abdominal pain   3. Diarrhea   4. HIV disease    Medications  sodium chloride 0.9 % bolus 1,000 mL (not administered)  sodium chloride 0.9 % bolus 1,000 mL (0 mLs Intravenous Stopped 11/10/13 1249)  iohexol (OMNIPAQUE) 300 MG/ML solution 25 mL (25 mLs Oral Contrast Given 11/10/13 1041)  iohexol (OMNIPAQUE) 300 MG/ML solution 100 mL (100 mLs Intravenous Contrast Given 11/10/13 1230)   Filed Vitals:   11/10/13 1003 11/10/13 1004 11/10/13 1130 11/10/13 1153  BP: 107/64 98/64 109/71 109/71  Pulse: 94 97 68 67  Temp:      TempSrc:      Resp:    14  Height:       Weight:      SpO2:   98% 100%    Patient presenting to emergency department with diarrhea and abdominal pain that has been ongoing for the past 3 weeks. Abdominal pain described as intermittent cramping, soreness sensation that is generalized. Patient is HIV positive. Patient has not seen infectious disease since 2012. Patient does not take medications on a daily basis because he does not want to-as per patient's response. Last CD4 counts and HIV RNA load was recorded on 06/13/2011 with a CD4 count of 300 and HIV RNA load of 50. Alert and oriented. GCS 15. Heart rate and rhythm normal. Lungs clear to auscultation. Radial and DP pulses 2+ bilaterally. Negative leg swelling identified or pitting edema noted. Full range of motion to upper and lower extremities bilaterally. Bowel sounds normoactive in all 4 quadrants-soft. Discomfort upon palpation to the abdomen, diffuse tenderness, positive guarding.  Urine noted high specific gravity 1.034 with small amount of bilirubin, ketones of 15, small leukocytes-negative pyuria identified. CBC negative findings. CMP negative findings. Lipase negative elevation. EKG identified normal sinus rhythm with a heart rate of 78 beats per minute-negative ischemic changes identified. Troponin negative elevation. Repeat chest x-ray negative findings for pneumonia. CT abdomen and pelvis with contrast identified appearance of the proximal small bowel may reflect mild obstruction or ileus secondary to what appears to be an area of intussusception possible internal hernia in the left mid abdomen-no complete instruction is identified. No evidence of acute hepatobiliary abnormalities. No evidence of inflammation of the colon-no acute appendicitis or evidence of diverticulitis. Doubt pneumonia. Doubt appendicitis. Doubt acute inflammatory processes of the abdomen. Patient presenting to the ED with ileus. Discussed case, imaging, labs with attending physician who agreed to plan of  admission. This provider spoke with Internal Medicine Teaching Services - discussed case, labs, imaging, history , and presentation - recommended NG tube to be placed. Patient to be admitted to MedSurg regarding ileus. Patient stable for transfer.   Raymon Mutton, PA-C 11/10/13 1815

## 2013-11-10 NOTE — Progress Notes (Signed)
Called to bedside by patient. Patient is very uncomfortable with NG tube. He states that either the medical staff will take the tube out or he will take it out himself and leave AMA. I explained the reason why the NG tube is being used. He voiced understanding of this. I explained the risks of removing the NG tube. The patients voiced understanding of this. The patient was alert and oriented. He appeared to have capacity to make this decision. Thus, I placed an order to remove the NG tube per the patients request.

## 2013-11-10 NOTE — H&P (Signed)
Date: 11/10/2013               Patient Name:  Gabriel [name] MRN: [id number]  DOB: 28-Dec-1975 Age / Sex: 38 y.o., male   PCP: No Pcp Per Patient         Medical Service: Internal Medicine Teaching Service         Attending Physician: Dr. Margarito Liner     First Contact: Dr. Johna Roles Pager: [phone]  Second Contact: Dr. Virgina Organ Pager: [phone]       After Hours (After 5p/  First Contact Pager: [phone]  weekends / holidays): Second Contact Pager: [phone]   Chief Complaint: diarrhea for 3 weeks  History of Present Illness:   Gabriel [name] is a 38 year old man with past medical history of HIV infection (last CD4 300 viral load 50 copies 05/2011) with noncompliance with medications/follow-up care who presents with diarrhea of  3 week duration. Pt reports that for the past 3 weeks he has been having diarrhea consisting of frequent watery stools (3-4/hr), at times foamy that is unrelieved with fasting or worsened with food. Pt reports diarrhea is preceded by lower abdominal cramping (periumbilical), stomach growling,  and gas. Pt reports his appetite is good and is able to tolerate PO intake with no nausea or vomiting.  He denies fever but reports chills, fatigue, lightheadedness with standing, mild headache, and thick productive cough  He denies recent sick contacts, travel, change in diet,  or medications. He has no history of prior abdominal surgeries and denies similar diarrhea in the past. He reports not following up with ID clinic since 2012 and was to have an appointment this coming week. His last CD4 count was 300 with viral load 50 copies on 05/2011. Pt reports history of noncompliance with HIV medications which include prezista, truvuda, norvir, and bactrim DS. He reports not taking medications at one point for 6 months. He states that he was not taking his medications last month and just starting taking them 1 week ago. He reports having been treated for syphilis but is unsure.              Meds: Current Facility-Administered Medications  Medication Dose Route Frequency Provider Last Rate Last Dose  . sodium chloride 0.9 % bolus 1,000 mL  1,000 mL Intravenous Once Raymon Mutton, PA-C       Current Outpatient Prescriptions  Medication Sig Dispense Refill  . Darunavir Ethanolate (PREZISTA) 800 MG TABS Take 1 tablet by mouth daily with breakfast.  30 tablet  3  . emtricitabine-tenofovir (TRUVADA) 200-300 MG per tablet Take 1 tablet by mouth daily.        . ritonavir (NORVIR) 100 MG capsule Take 100 mg by mouth daily.        Marland Kitchen sulfamethoxazole-trimethoprim (BACTRIM DS) 800-160 MG per tablet Take 1 tablet by mouth daily.          Allergies: Allergies as of 11/10/2013  . (No Known Allergies)   Past Medical History  Diagnosis Date  . HIV positive   . MRSA infection   . Cellulitis    Past Surgical History  Procedure Laterality Date  . Hand / finger lesion excision     Family History  Problem Relation Age of Onset  . Cancer Maternal Grandmother    History   Social History  . Marital Status: Single    Spouse Name: N/A    Number of Children: N/A  . Years of Education: N/A   Occupational History  . Not  on file.   Social History Main Topics  . Smoking status: Current Every Day Smoker -- 1.00 packs/day    Types: Cigarettes  . Smokeless tobacco: Never Used  . Alcohol Use: 1.5 oz/week    3 drink(s) per week  . Drug Use: No     Comment: hx of multiple substances in the past  . Sexual Activity: Yes    Partners: Male   Other Topics Concern  . Not on file   Social History Narrative   internet processor    Review of Systems: Review of Systems  Constitutional: Positive for chills, malaise/fatigue and diaphoresis (this morning). Negative for fever and weight loss.  HENT: Negative for congestion and sore throat.   Eyes: Negative for blurred vision.  Respiratory: Positive for cough (1 week ago) and sputum production (1 week ago). Negative for hemoptysis.    Cardiovascular: Negative for chest pain, palpitations and leg swelling.  Gastrointestinal: Positive for abdominal pain (lower abdominal ) and diarrhea. Negative for heartburn, nausea, vomiting, constipation, blood in stool and melena.  Genitourinary: Negative for dysuria, urgency, frequency and hematuria.  Musculoskeletal: Negative for back pain, joint pain, myalgias and neck pain.  Skin: Negative for itching and rash.  Neurological: Positive for dizziness (with standing), weakness and headaches (mild). Negative for sensory change and loss of consciousness.  Endo/Heme/Allergies: Does not bruise/bleed easily.  Psychiatric/Behavioral: Positive for substance abuse (tobacco).    Physical Exam: Blood pressure 102/68, pulse 72, temperature 98.3 F (36.8 C), temperature source Oral, resp. rate 15, height 6\' 6"  (1.981 m), weight 83.915 kg (185 lb), SpO2 99.00%.  Physical Exam  Constitutional: He is oriented to person, place, and time. No distress.  Thin appearing  HENT:  Head: Normocephalic and atraumatic.  Right Ear: External ear normal.  Nose: Nose normal.  Mouth/Throat: Oropharynx is clear and moist. No oropharyngeal exudate.  No oral thrush  Eyes: Conjunctivae and EOM are normal. Pupils are equal, round, and reactive to light. Right eye exhibits no discharge. Left eye exhibits no discharge.  Neck: Normal range of motion. Neck supple.  Cardiovascular: Normal rate, regular rhythm and normal heart sounds.   Pulmonary/Chest: Effort normal and breath sounds normal. No respiratory distress. He has no wheezes. He has no rales. He exhibits no tenderness.  Poor air movement  Abdominal: Bowel sounds are normal. There is tenderness (lower abdominal, near umbilicus ). There is guarding (voluntary).  Musculoskeletal: Normal range of motion. He exhibits no edema and no tenderness.  Lymphadenopathy:    He has no cervical adenopathy.  Neurological: He is alert and oriented to person, place, and time.  No cranial nerve deficit.  Skin: Skin is warm and dry. No rash noted. He is not diaphoretic. No erythema. No pallor.  Psychiatric: He has a normal mood and affect. His behavior is normal. Thought content normal.     Lab results: Basic Metabolic Panel:  Recent Labs  16/07/9600/18/15 0940  NA 139  K 4.1  CL 99  CO2 28  GLUCOSE 115*  BUN 14  CREATININE 0.91  CALCIUM 8.6   Liver Function Tests:  Recent Labs  11/10/13 0940  AST 16  ALT 13  ALKPHOS 86  BILITOT 0.5  PROT 8.0  ALBUMIN 3.5    Recent Labs  11/10/13 0940  LIPASE 24   No results found for this basename: AMMONIA,  in the last 72 hours CBC:  Recent Labs  11/10/13 0940  WBC 6.7  NEUTROABS 4.8  HGB 14.0  HCT 41.7  MCV  95.2  PLT 257   Cardiac Enzymes:  Recent Labs  11/10/13 0940  TROPONINI <0.30   BNP: No results found for this basename: PROBNP,  in the last 72 hours D-Dimer: No results found for this basename: DDIMER,  in the last 72 hours CBG: No results found for this basename: GLUCAP,  in the last 72 hours Hemoglobin A1C: No results found for this basename: HGBA1C,  in the last 72 hours Fasting Lipid Panel: No results found for this basename: CHOL, HDL, LDLCALC, TRIG, CHOLHDL, LDLDIRECT,  in the last 72 hours Thyroid Function Tests: No results found for this basename: TSH, T4TOTAL, FREET4, T3FREE, THYROIDAB,  in the last 72 hours Anemia Panel: No results found for this basename: VITAMINB12, FOLATE, FERRITIN, TIBC, IRON, RETICCTPCT,  in the last 72 hours Coagulation: No results found for this basename: LABPROT, INR,  in the last 72 hours Urine Drug Screen: Drugs of Abuse     Component Value Date/Time   LABOPIA POSITIVE* 01/05/2009 1519   COCAINSCRNUR POSITIVE* 01/05/2009 1519   LABBENZ NONE DETECTED 01/05/2009 1519   AMPHETMU POSITIVE* 01/05/2009 1519   THCU NONE DETECTED 01/05/2009 1519   LABBARB  Value: NONE DETECTED        DRUG SCREEN FOR MEDICAL PURPOSES ONLY.  IF CONFIRMATION IS NEEDED  FOR ANY PURPOSE, NOTIFY LAB WITHIN 5 DAYS.        LOWEST DETECTABLE LIMITS FOR URINE DRUG SCREEN Drug Class       Cutoff (ng/mL) Amphetamine      1000 Barbiturate      200 Benzodiazepine   200 Tricyclics       300 Opiates          300 Cocaine          300 THC              50 01/05/2009 1519    Alcohol Level: No results found for this basename: ETH,  in the last 72 hours Urinalysis:  Recent Labs  11/10/13 1001  COLORURINE AMBER*  LABSPEC 1.034*  PHURINE 6.0  GLUCOSEU NEGATIVE  HGBUR NEGATIVE  BILIRUBINUR SMALL*  KETONESUR 15*  PROTEINUR NEGATIVE  UROBILINOGEN 1.0  NITRITE NEGATIVE  LEUKOCYTESUR SMALL*    Imaging results:  Dg Chest 1 View  11/10/2013   CLINICAL DATA:  Possible pulmonary nodule  EXAM: CHEST - 1 VIEW  COMPARISON:  Study performed earlier today  FINDINGS: Lungs are clear. Heart size normal. Nipple markers indicate that the nodular density seen on the prior study represented a nipple shadow.  IMPRESSION: No pulmonary nodules.   Electronically Signed   By: Esperanza Heir M.D.   On: 11/10/2013 13:01   Dg Chest 2 View  11/10/2013   CLINICAL DATA:  Cough for 3 weeks with diarrhea  EXAM: CHEST  2 VIEW  COMPARISON:  12/16/2009  FINDINGS: Hyperinflation suggests COPD. Heart size and vascular pattern are normal. 1 cm nodular opacity over the right mid lung zone. On the left side, there are no abnormal opacities.  IMPRESSION: No evidence of pneumonia.  COPD.  Recommend repeating the PA view with nipple markers to confirm that the nodular density represents a nipple shadow.   Electronically Signed   By: Esperanza Heir M.D.   On: 11/10/2013 10:46   Ct Abdomen Pelvis W Contrast  11/10/2013   CLINICAL DATA:  Three-week history of crampy abdominal pain and diarrhea, history of HIV and MR as a  EXAM: CT ABDOMEN AND PELVIS WITH CONTRAST  TECHNIQUE: Multidetector CT  imaging of the abdomen and pelvis was performed using the standard protocol following bolus administration of intravenous  contrast.  CONTRAST:  OMNIPAQUE IOHEXOL 300 MG/ML SOLN, the patient did receive oral contrast material.  COMPARISON:  CT scan of the abdomen and pelvis dated December 17, 2009.  FINDINGS: The liver exhibits no focal mass nor ductal dilation. The gallbladder is adequately distended with no evidence of stones or surrounding inflammatory changes. The pancreas, spleen, nondistended stomach, adrenal glands, and kidneys exhibit no acute abnormality. The caliber of the abdominal aorta is normal. There is no periaortic nor pericaval lymphadenopathy.  The orally administered contrast has traversed the small bowel and reached as far as the splenic flexure of the colon. There is mild distention of small-bowel loops in the mid and upper abdomen. In the left mid abdomen on images 31 through 41 there is a configuration of unopacified small bowel loops that may reflect intussusception. No edematous bowel loops are demonstrated.  Contrast is present within the colon as far as the splenic flexure and it appears normal. A normal calibered, contrast-filled appendix is demonstrated. There is no evidence of colitis or diverticulitis. The nondistended portion of the colon exhibits no suspicious findings. There is a moderate amount gas and some stool in the rectum.  The partially distended urinary bladder is normal in appearance. The prostate gland produces a mild impression upon the urinary bladder base. There is no evidence of ascites. There is no inguinal nor umbilical hernia. The lung bases are clear. The lumbar vertebral bodies are preserved in height. The lung bases are clear.  IMPRESSION: 1. The appearance of the proximal small bowel may reflect mild obstruction or ileus secondary to what appears to be an area of intussusception or possible internal hernia in the left mid abdomen. There is no complete obstruction since contrast has proceeded is far distally as the splenic flexure of the colon. 2. There is no evidence of  inflammation of the colon. There is no acute appendicitis or evidence of diverticulitis. 3. There is no evidence of acute hepatobiliary or acute urinary tract abnormality. 4. There is no evidence of ascites. No intra-abdominal or pelvic lymphadenopathy is demonstrated.   Electronically Signed   By: David  [place]   On: 11/10/2013 13:00    Other results:  EKG:  Vent. rate 70 BPM PR interval 167 ms QRS duration 123 ms QT/QTc 412/445 ms P-R-T axes 73 79 75 Sinus rhythm Non-specific intra-ventricular conduction delay No significant change since last tracing  Assessment & Plan by Problem: Principal Problem:   Diarrhea Active Problems:   HIV DISEASE   TOBACCO USER   Ileus  Assessment: 38 year old man with past medical history of HIV infection (last CD4 300 viral load 50 copies 05/2011) with noncompliance with medications/follow-up care  who presents with diarrhea of 3 week duration and found to have possible short bowel obstruction on CT imaging.   Plan:   Possible Proximal Partial Small Bowel Obstruction  - Pt with 3-week history of diarrhea with cramping lower abdominal and normal passage of gas with CT abdomen w/cont findings of the proximal small bowel with possible mild obstruction or ileus secondary to possible intussusception or internal hernia in the left mid abdomen with no complete obstruction. Pt is afebrile with no leukocytosis and lipase and LFT's within normal limit. -Maintain NPO for bowel rest -125 cc/mL NS -NG tube for decompression  -Monitor for peritoneal signs  Diarrhea - Pt with 3 week history of diarrhea in setting of unmanaged  HIV infection concerning for opportunistic infection (cryptosporidium if CD<100, MAC, CMV<50). Etiologies include infectious (afebrile, no leukocytosis, however immunocompromised) vs secretory vs osmotic (however no response to fasting) vs malabsorption (no reported fatty stools) vs inflammatory (no evidence of blood). Pt with no recent  antibiotic use and c. diff testing was negative.  -Contact enteric precautions  -Obtain GI pathogen panel -Obtain stool culture  -Obtain O & P -Obtain fecal lactoferrin   HIV Infection - Pt with HIV infection (last CD4 300 viral load 50 copies 05/2011) with no follow-up care since 2012 who is non-compliant with medications (recently started 1 week ago).   -Obtain CD4 and viral load -Continue home prezista 800mg  daily -Continue truvada 200-300 mg daily  -Continue home ritonavir 100 mg daily  -Continue bactrim DS 800-160 mg daily   Hyperlipidemia - Pt with last lipid panel on 06/21/11 with hypertriglyceridemia (231). -Obtain lipid panel -Obtain HbA1c    Tobacco Abuse - Pt with 1 pack/day history for 19 years. -Place nicotine patch 21 mg   Diet: NPO DVT Ppx: Lovenox  Code: Full    Dispo: Disposition is deferred at this time, awaiting improvement of current medical problems. Anticipated discharge in approximately 2-3 day(s).   The patient does not have a current PCP (No Pcp Per Patient) and does need an [facility] hospital follow-up appointment after discharge.  The patient does not have transportation limitations that hinder transportation to clinic appointments.  Signed: Otis Brace, MD 11/10/2013, 5:36 PM

## 2013-11-10 NOTE — ED Notes (Signed)
PT reports diarrhea for 3 weeks. Pt states he has diarrhea multiple times an hour. Pt denies in colon DX.

## 2013-11-11 ENCOUNTER — Inpatient Hospital Stay (HOSPITAL_COMMUNITY): Payer: BC Managed Care – PPO

## 2013-11-11 ENCOUNTER — Encounter (HOSPITAL_COMMUNITY): Payer: Self-pay | Admitting: General Surgery

## 2013-11-11 DIAGNOSIS — E440 Moderate protein-calorie malnutrition: Secondary | ICD-10-CM | POA: Insufficient documentation

## 2013-11-11 DIAGNOSIS — R197 Diarrhea, unspecified: Secondary | ICD-10-CM

## 2013-11-11 DIAGNOSIS — E46 Unspecified protein-calorie malnutrition: Secondary | ICD-10-CM

## 2013-11-11 DIAGNOSIS — F17200 Nicotine dependence, unspecified, uncomplicated: Secondary | ICD-10-CM

## 2013-11-11 DIAGNOSIS — E785 Hyperlipidemia, unspecified: Secondary | ICD-10-CM

## 2013-11-11 DIAGNOSIS — E876 Hypokalemia: Secondary | ICD-10-CM

## 2013-11-11 DIAGNOSIS — D649 Anemia, unspecified: Secondary | ICD-10-CM

## 2013-11-11 DIAGNOSIS — Z21 Asymptomatic human immunodeficiency virus [HIV] infection status: Secondary | ICD-10-CM

## 2013-11-11 LAB — HIV-1 RNA ULTRAQUANT REFLEX TO GENTYP+
HIV 1 RNA Quant: 67300 copies/mL — ABNORMAL HIGH (ref <20)
HIV-1 RNA QUANT, LOG: 4.83 {Log} — AB (ref <1.30)

## 2013-11-11 LAB — LIPID PANEL
Cholesterol: 106 mg/dL (ref 0–200)
HDL: 26 mg/dL — ABNORMAL LOW (ref >39)
LDL Cholesterol: 58 mg/dL (ref 0–99)
Total CHOL/HDL Ratio: 4.1 RATIO
Triglycerides: 112 mg/dL (ref <150)
VLDL: 22 mg/dL (ref 0–40)

## 2013-11-11 LAB — CBC
HCT: 37.7 % — ABNORMAL LOW (ref 39.0–52.0)
Hemoglobin: 12.5 g/dL — ABNORMAL LOW (ref 13.0–17.0)
MCH: 31.4 pg (ref 26.0–34.0)
MCHC: 33.2 g/dL (ref 30.0–36.0)
MCV: 94.7 fL (ref 78.0–100.0)
Platelets: 234 10*3/uL (ref 150–400)
RBC: 3.98 MIL/uL — AB (ref 4.22–5.81)
RDW: 13.8 % (ref 11.5–15.5)
WBC: 8.2 10*3/uL (ref 4.0–10.5)

## 2013-11-11 LAB — BASIC METABOLIC PANEL
BUN: 12 mg/dL (ref 6–23)
CHLORIDE: 105 meq/L (ref 96–112)
CO2: 20 mEq/L (ref 19–32)
Calcium: 8.2 mg/dL — ABNORMAL LOW (ref 8.4–10.5)
Creatinine, Ser: 0.8 mg/dL (ref 0.50–1.35)
GFR calc non Af Amer: >90 mL/min (ref >90)
Glucose, Bld: 83 mg/dL (ref 70–99)
POTASSIUM: 3.6 meq/L — AB (ref 3.7–5.3)
Sodium: 141 mEq/L (ref 137–147)

## 2013-11-11 LAB — GI PATHOGEN PANEL BY PCR, STOOL
C difficile toxin A/B: NEGATIVE
CRYPTOSPORIDIUM BY PCR: NEGATIVE
Campylobacter by PCR: NEGATIVE
E coli (ETEC) LT/ST: NEGATIVE
E coli (STEC): NEGATIVE
E coli 0157 by PCR: NEGATIVE
G lamblia by PCR: POSITIVE
NOROVIRUS G1/G2: NEGATIVE
Rotavirus A by PCR: NEGATIVE
SHIGELLA BY PCR: NEGATIVE
Salmonella by PCR: NEGATIVE

## 2013-11-11 LAB — HEMOGLOBIN A1C
Hgb A1c MFr Bld: 5.4 % (ref <5.7)
Mean Plasma Glucose: 108 mg/dL (ref <117)

## 2013-11-11 LAB — CD4/CD8 (T-HELPER/T-SUPPRESSOR CELL)
CD4 absolute: 340 /uL — ABNORMAL LOW (ref 500–1900)
CD4%: 23 % — ABNORMAL LOW (ref 30.0–60.0)
CD8 T Cell Abs: 580 /uL (ref 230–1000)
CD8TOX: 38 % (ref 15.0–40.0)
Ratio: 0.59 — ABNORMAL LOW (ref 1.0–3.0)
Total lymphocyte count: 1530 /uL (ref 1000–4000)

## 2013-11-11 LAB — MAGNESIUM: Magnesium: 1.8 mg/dL (ref 1.5–2.5)

## 2013-11-11 MED ORDER — POTASSIUM CHLORIDE CRYS ER 20 MEQ PO TBCR: 40.0000 meq | EXTENDED_RELEASE_TABLET | Freq: Once | ORAL | Status: DC

## 2013-11-11 MED ORDER — METRONIDAZOLE IN NACL 5-0.79 MG/ML-% IV SOLN
500.0000 mg | Freq: Three times a day (TID) | INTRAVENOUS | Status: DC
  Administered 2013-11-11 – 2013-11-12 (×3): 500 mg via INTRAVENOUS
  Filled 2013-11-11 (×5): qty 100

## 2013-11-11 MED ORDER — POTASSIUM CHLORIDE 10 MEQ/100ML IV SOLN
10.0000 meq | INTRAVENOUS | Status: AC
  Administered 2013-11-11 (×3): 10 meq via INTRAVENOUS
  Filled 2013-11-11 (×3): qty 100

## 2013-11-11 NOTE — H&P (Signed)
Internal Medicine Attending Admission Note Date: 11/11/2013  Patient name: Gabriel [name] Medical record number: [id number] Date of birth: May 21, 1976 Age: 38 y.o. Gender: male  I saw and evaluated the patient. I reviewed the resident's note and I agree with the resident's findings and plan as documented in the resident's note, with the following additional comments.  Chief Complaint(s): Diarrhea  History - key components related to admission: Patient is a 38 year old man with history of HIV admitted with complaint of frequent diarrhea for the past 3 weeks.  He denies abdominal pain, nausea, vomiting, bright red blood per rectum, melena, fever, chills, or sweats.  He was previously followed in the ID clinic but has not been seen there for over 2 years; he reports taking his antiretrovirals only intermittently.   Physical Exam - key components related to admission:  Filed Vitals:   11/10/13 2307 11/11/13 0113 11/11/13 0509 11/11/13 1051  BP: 103/66 93/57 101/61 97/56  Pulse: 72 76 101 88  Temp: 98.6 F (37 C) 98.5 F (36.9 C) 98.9 F (37.2 C) 98.4 F (36.9 C)  TempSrc: Oral Oral Oral Oral  Resp: 20 20 20 18   Height:      Weight:      SpO2: 100% 98% 96% 98%    General: Alert, no distress Lungs: Clear Heart: Regular; S1-S2, no S3, no S4, no murmurs Abdomen: Bowel sounds present, soft, nontender Extremities: No edema   Lab results:   Basic Metabolic Panel:  Recent Labs  21/30/86 0940 11/11/13 0541  NA 139 141  K 4.1 3.6*  CL 99 105  CO2 28 20  GLUCOSE 115* 83  BUN 14 12  CREATININE 0.91 0.80  CALCIUM 8.6 8.2*  MG  --  1.8    Liver Function Tests:  Recent Labs  11/10/13 0940  AST 16  ALT 13  ALKPHOS 86  BILITOT 0.5  PROT 8.0  ALBUMIN 3.5    Recent Labs  11/10/13 0940  LIPASE 24    CBC:  Recent Labs  11/10/13 0940 11/11/13 0541  WBC 6.7 8.2  HGB 14.0 12.5*  HCT 41.7 37.7*  MCV 95.2 94.7  PLT 257 234    Recent Labs  11/10/13 0940   NEUTROABS 4.8  LYMPHSABS 1.2  MONOABS 0.6  EOSABS 0.1  BASOSABS 0.0    Cardiac Enzymes:  Recent Labs  11/10/13 0940  TROPONINI <0.30     Fasting Lipid Panel:  Recent Labs  11/11/13 0541  CHOL 106  HDL 26*  LDLCALC 58  TRIG 578  CHOLHDL 4.1      Urine Drug Screen: Drugs of Abuse     Component Value Date/Time   LABOPIA POSITIVE* 01/05/2009 1519   COCAINSCRNUR POSITIVE* 01/05/2009 1519   LABBENZ NONE DETECTED 01/05/2009 1519   AMPHETMU POSITIVE* 01/05/2009 1519   THCU NONE DETECTED 01/05/2009 1519   LABBARB  Value: NONE DETECTED        DRUG SCREEN FOR MEDICAL PURPOSES ONLY.  IF CONFIRMATION IS NEEDED FOR ANY PURPOSE, NOTIFY LAB WITHIN 5 DAYS.        LOWEST DETECTABLE LIMITS FOR URINE DRUG SCREEN Drug Class       Cutoff (ng/mL) Amphetamine      1000 Barbiturate      200 Benzodiazepine   200 Tricyclics       300 Opiates          300 Cocaine          300 THC  50 01/05/2009 1519       Urinalysis    Component Value Date/Time   COLORURINE AMBER* 11/10/2013 1001   APPEARANCEUR CLOUDY* 11/10/2013 1001   LABSPEC 1.034* 11/10/2013 1001   PHURINE 6.0 11/10/2013 1001   GLUCOSEU NEGATIVE 11/10/2013 1001   HGBUR NEGATIVE 11/10/2013 1001   BILIRUBINUR SMALL* 11/10/2013 1001   KETONESUR 15* 11/10/2013 1001   PROTEINUR NEGATIVE 11/10/2013 1001   UROBILINOGEN 1.0 11/10/2013 1001   NITRITE NEGATIVE 11/10/2013 1001   LEUKOCYTESUR SMALL* 11/10/2013 1001    Urine microscopic:  Recent Labs  11/10/13 1001  EPIU RARE  WBCU 3-6  OTHERU MUCOUS PRESENT    HIV 1 RNA Quant  Date Value Range Status  06/21/2011 50* <20 copies/mL Final     CD4 T Cell Abs  Date Value Range Status  06/21/2011 300* 400 - 2700 cmm Final       Imaging results:  Dg Chest 1 View  11/10/2013   CLINICAL DATA:  Possible pulmonary nodule  EXAM: CHEST - 1 VIEW  COMPARISON:  Study performed earlier today  FINDINGS: Lungs are clear. Heart size normal. Nipple markers indicate that the nodular density seen  on the prior study represented a nipple shadow.  IMPRESSION: No pulmonary nodules.   Electronically Signed   By: Esperanza Heir M.D.   On: 11/10/2013 13:01   Dg Chest 2 View  11/10/2013   CLINICAL DATA:  Cough for 3 weeks with diarrhea  EXAM: CHEST  2 VIEW  COMPARISON:  12/16/2009  FINDINGS: Hyperinflation suggests COPD. Heart size and vascular pattern are normal. 1 cm nodular opacity over the right mid lung zone. On the left side, there are no abnormal opacities.  IMPRESSION: No evidence of pneumonia.  COPD.  Recommend repeating the PA view with nipple markers to confirm that the nodular density represents a nipple shadow.   Electronically Signed   By: Esperanza Heir M.D.   On: 11/10/2013 10:46   Ct Abdomen Pelvis W Contrast  11/10/2013   CLINICAL DATA:  Three-week history of crampy abdominal pain and diarrhea, history of HIV and MR as a  EXAM: CT ABDOMEN AND PELVIS WITH CONTRAST  TECHNIQUE: Multidetector CT imaging of the abdomen and pelvis was performed using the standard protocol following bolus administration of intravenous contrast.  CONTRAST:  OMNIPAQUE IOHEXOL 300 MG/ML SOLN, the patient did receive oral contrast material.  COMPARISON:  CT scan of the abdomen and pelvis dated December 17, 2009.  FINDINGS: The liver exhibits no focal mass nor ductal dilation. The gallbladder is adequately distended with no evidence of stones or surrounding inflammatory changes. The pancreas, spleen, nondistended stomach, adrenal glands, and kidneys exhibit no acute abnormality. The caliber of the abdominal aorta is normal. There is no periaortic nor pericaval lymphadenopathy.  The orally administered contrast has traversed the small bowel and reached as far as the splenic flexure of the colon. There is mild distention of small-bowel loops in the mid and upper abdomen. In the left mid abdomen on images 31 through 41 there is a configuration of unopacified small bowel loops that may reflect intussusception. No  edematous bowel loops are demonstrated.  Contrast is present within the colon as far as the splenic flexure and it appears normal. A normal calibered, contrast-filled appendix is demonstrated. There is no evidence of colitis or diverticulitis. The nondistended portion of the colon exhibits no suspicious findings. There is a moderate amount gas and some stool in the rectum.  The partially distended urinary bladder is  normal in appearance. The prostate gland produces a mild impression upon the urinary bladder base. There is no evidence of ascites. There is no inguinal nor umbilical hernia. The lung bases are clear. The lumbar vertebral bodies are preserved in height. The lung bases are clear.  IMPRESSION: 1. The appearance of the proximal small bowel may reflect mild obstruction or ileus secondary to what appears to be an area of intussusception or possible internal hernia in the left mid abdomen. There is no complete obstruction since contrast has proceeded is far distally as the splenic flexure of the colon. 2. There is no evidence of inflammation of the colon. There is no acute appendicitis or evidence of diverticulitis. 3. There is no evidence of acute hepatobiliary or acute urinary tract abnormality. 4. There is no evidence of ascites. No intra-abdominal or pelvic lymphadenopathy is demonstrated.   Electronically Signed   By: David  [place]   On: 11/10/2013 13:00   Dg Abd Portable 1v  11/11/2013   CLINICAL DATA:  Rule out obstruction.  Chronic diarrhea.  HIV.  EXAM: PORTABLE ABDOMEN - 1 VIEW  COMPARISON:  CT 11/10/2013  FINDINGS: Gas in nondilated large and small bowel loops. Negative for bowel obstruction. Negative for bowel thickening or pneumatosis. No mass or soft tissue calcifications.  IMPRESSION: Negative   Electronically Signed   By: Marlan Palauharles  Clark M.D.   On: 11/11/2013 09:26    Other results: EKG: Sinus rhythm; nonspecific intraventricular conduction delay; ST elev, probable normal early repol  pattern  Assessment & Plan by Problem:  1.  Severe diarrhea.  The differential diagnosis is broad, and will depend upon his CD4 count (last value was 300 in August of 2012).  Plan is stool studies for culture, O&P, GI pathogen panel, qualitative fecal fat, and fecal lactoferrin; IVF; follow electrolytes.  2.  Abdominal CT scan finding of possible small bowel obstruction or ileus secondary to what appears to be an area of intussusception or possible internal hernia in the left mid abdomen.  Patient does not have signs or symptoms of frank small bowel obstruction.  Plan is n.p.o.; IVF; surgery consult; follow abdominal x-rays.  3.  HIV.  Patient reports that he has taken his antiretroviral medications intermittently, often missing a month or more at a time.  A viral load and CD4 are pending; genotyping will likely be needed.  Plan is ID consult.

## 2013-11-11 NOTE — Consult Note (Signed)
Reason for Consult: possible small bowel obstruction Referring Physician:  Dr. Guerry Bruin Joines   HPI: Gabriel [name] is a 38 year old male with a history of HIV, non compliance who presented to [facility] with a 3 week history of diarrhea.  The patient reports developing pain and vomiting yesterday after taking the train from [place] into [place]. His symptoms were moderate in severity.   Time pattern is intermittent.  Associated with malaise. No aggravating or alleviating factors.  No modifying factors.  He reports over the past 3 weeks at least having 4 bowel movements per day, watery, but actually firm and normal yesterday.  He has been off his ART for quite some time.  Denies close ill contacts.  Denies fever, chills or sweats.  He reports an approximate 20 lb weight loss in recent month.  He denies melena, hematochezia.  Denies nausea or vomiting.  His pain is now resolved.  He had 3 loose BMs today.  Family history significant for brother who was recently diagnosed with Crohn's disease.  His father passed from "a cyst in the intestines causing a blockage."  His labs upon admission showed a normal white count, negative stool for c. Diff, a CT of abdomen suspected small bowel obstruction versus an ileus versus an intussusception or a internal hernia and therefore surgery was consulted.  He had a NGT Which he only tolerated for a few hours.  At present time, he is pain free, continues to have loose BMs.  A XR of abdomen today did not show an obstruction.    Past Medical History  Diagnosis Date  . HIV positive   . MRSA infection   . Cellulitis     Past Surgical History  Procedure Laterality Date  . Hand / finger lesion excision      Family History  Problem Relation Age of Onset  . Cancer Maternal Grandmother   . Crohn's disease Brother     Social History:  reports that he has been smoking Cigarettes.  He has been smoking about 1.00 pack per day. He has never used smokeless tobacco. He  reports that he drinks about 1.5 ounces of alcohol per week. He reports that he does not use illicit drugs.  Allergies: No Known Allergies  Medications: Current facility-administered medications:0.9 %  sodium chloride infusion, , Intravenous, Continuous, Jerene Pitch, MD, Last Rate: 100 mL/hr at 11/11/13 0945;  acetaminophen (TYLENOL) suppository 650 mg, 650 mg, Rectal, Q6H PRN, Jerene Pitch, MD;  acetaminophen (TYLENOL) tablet 650 mg, 650 mg, Oral, Q6H PRN, Jerene Pitch, MD;  antiseptic oral rinse (BIOTENE) solution 15 mL, 15 mL, Mouth Rinse, q12n4p, Axel Filler, MD chlorhexidine (PERIDEX) 0.12 % solution 15 mL, 15 mL, Mouth Rinse, BID, Axel Filler, MD, 15 mL at 11/11/13 0848;  Darunavir Ethanolate (PREZISTA) tablet 800 mg, 800 mg, Oral, QHS, Axel Filler, MD, 800 mg at 11/11/13 0001;  emtricitabine-tenofovir (TRUVADA) 200-300 MG per tablet 1 tablet, 1 tablet, Oral, QHS, Axel Filler, MD, 1 tablet at 11/10/13 2355 enoxaparin (LOVENOX) injection 40 mg, 40 mg, Subcutaneous, Q24H, Jerene Pitch, MD;  metroNIDAZOLE (FLAGYL) IVPB 500 mg, 500 mg, Intravenous, Q8H, Carlyle Basques, MD;  nicotine (NICODERM CQ - dosed in mg/24 hours) patch 21 mg, 21 mg, Transdermal, Daily, Axel Filler, MD, 21 mg at 11/11/13 0946;  ondansetron (ZOFRAN) injection 4 mg, 4 mg, Intravenous, Q6H PRN, Jerene Pitch, MD ondansetron (ZOFRAN) tablet 4 mg, 4 mg, Oral, Q6H PRN, Jerene Pitch, MD;  potassium chloride 10 mEq  in 100 mL IVPB, 10 mEq, Intravenous, Q1 Hr x 3, Marjan Rabbani, MD, 10 mEq at 11/11/13 1254;  ritonavir (NORVIR) capsule 100 mg, 100 mg, Oral, QHS, Axel Filler, MD, 100 mg at 11/11/13 0001;  sodium chloride 0.9 % bolus 1,000 mL, 1,000 mL, Intravenous, Once, Marissa Sciacca, PA-C sulfamethoxazole-trimethoprim (BACTRIM DS) 800-160 MG per tablet 1 tablet, 1 tablet, Oral, Daily, Jerene Pitch, MD, 1 tablet at 11/11/13 0945  Results for orders placed during the hospital encounter of  11/10/13 (from the past 48 hour(s))  CBC WITH DIFFERENTIAL     Status: None   Collection Time    11/10/13  9:40 AM      Result Value Range   WBC 6.7  4.0 - 10.5 K/uL   RBC 4.38  4.22 - 5.81 MIL/uL   Hemoglobin 14.0  13.0 - 17.0 g/dL   HCT 41.7  39.0 - 52.0 %   MCV 95.2  78.0 - 100.0 fL   MCH 32.0  26.0 - 34.0 pg   MCHC 33.6  30.0 - 36.0 g/dL   RDW 13.8  11.5 - 15.5 %   Platelets 257  150 - 400 K/uL   Neutrophils Relative % 71  43 - 77 %   Neutro Abs 4.8  1.7 - 7.7 K/uL   Lymphocytes Relative 18  12 - 46 %   Lymphs Abs 1.2  0.7 - 4.0 K/uL   Monocytes Relative 9  3 - 12 %   Monocytes Absolute 0.6  0.1 - 1.0 K/uL   Eosinophils Relative 1  0 - 5 %   Eosinophils Absolute 0.1  0.0 - 0.7 K/uL   Basophils Relative 1  0 - 1 %   Basophils Absolute 0.0  0.0 - 0.1 K/uL  COMPREHENSIVE METABOLIC PANEL     Status: Abnormal   Collection Time    11/10/13  9:40 AM      Result Value Range   Sodium 139  137 - 147 mEq/L   Potassium 4.1  3.7 - 5.3 mEq/L   Chloride 99  96 - 112 mEq/L   CO2 28  19 - 32 mEq/L   Glucose, Bld 115 (*) 70 - 99 mg/dL   BUN 14  6 - 23 mg/dL   Creatinine, Ser 0.91  0.50 - 1.35 mg/dL   Calcium 8.6  8.4 - 10.5 mg/dL   Total Protein 8.0  6.0 - 8.3 g/dL   Albumin 3.5  3.5 - 5.2 g/dL   AST 16  0 - 37 U/L   ALT 13  0 - 53 U/L   Alkaline Phosphatase 86  39 - 117 U/L   Total Bilirubin 0.5  0.3 - 1.2 mg/dL   GFR calc non Af Amer >90  >90 mL/min   GFR calc Af Amer >90  >90 mL/min   Comment: (NOTE)     The eGFR has been calculated using the CKD EPI equation.     This calculation has not been validated in all clinical situations.     eGFR's persistently <90 mL/min signify possible Chronic Kidney     Disease.  LIPASE, BLOOD     Status: None   Collection Time    11/10/13  9:40 AM      Result Value Range   Lipase 24  11 - 59 U/L  TROPONIN I     Status: None   Collection Time    11/10/13  9:40 AM      Result Value Range   Troponin  I <0.30  <0.30 ng/mL   Comment:             Due to the release kinetics of cTnI,     a negative result within the first hours     of the onset of symptoms does not rule out     myocardial infarction with certainty.     If myocardial infarction is still suspected,     repeat the test at appropriate intervals.  URINALYSIS, ROUTINE W REFLEX MICROSCOPIC     Status: Abnormal   Collection Time    11/10/13 10:01 AM      Result Value Range   Color, Urine AMBER (*) YELLOW   Comment: BIOCHEMICALS MAY BE AFFECTED BY COLOR   APPearance CLOUDY (*) CLEAR   Specific Gravity, Urine 1.034 (*) 1.005 - 1.030   pH 6.0  5.0 - 8.0   Glucose, UA NEGATIVE  NEGATIVE mg/dL   Hgb urine dipstick NEGATIVE  NEGATIVE   Bilirubin Urine SMALL (*) NEGATIVE   Ketones, ur 15 (*) NEGATIVE mg/dL   Protein, ur NEGATIVE  NEGATIVE mg/dL   Urobilinogen, UA 1.0  0.0 - 1.0 mg/dL   Nitrite NEGATIVE  NEGATIVE   Leukocytes, UA SMALL (*) NEGATIVE  URINE MICROSCOPIC-ADD ON     Status: None   Collection Time    11/10/13 10:01 AM      Result Value Range   Squamous Epithelial / LPF RARE  RARE   WBC, UA 3-6  <3 WBC/hpf   Urine-Other MUCOUS PRESENT    STOOL CULTURE     Status: None   Collection Time    11/10/13 12:13 PM      Result Value Range   Specimen Description STOOL     Special Requests NONE     Culture       Value: Culture reincubated for better growth     Performed at [facility]   Report Status PENDING    CLOSTRIDIUM DIFFICILE BY PCR     Status: None   Collection Time    11/10/13 12:14 PM      Result Value Range   C difficile by pcr NEGATIVE  NEGATIVE  HEMOGLOBIN A1C     Status: None   Collection Time    11/11/13  5:41 AM      Result Value Range   Hemoglobin A1C 5.4  <5.7 %   Comment: (NOTE)                                                                               According to the ADA Clinical Practice Recommendations for 2011, when     HbA1c is used as a screening test:      >=6.5%   Diagnostic of Diabetes Mellitus               (if  abnormal result is confirmed)     5.7-6.4%   Increased risk of developing Diabetes Mellitus     References:Diagnosis and Classification of Diabetes Mellitus,Diabetes     YDXA,1287,86([facility] 1):S62-S69 and Standards of Medical Care in             Diabetes - 2011,Diabetes Care,2011,34 (Suppl 1):S11-S61.   Mean  Plasma Glucose 108  <117 mg/dL   Comment: Performed at [facility]  CBC     Status: Abnormal   Collection Time    11/11/13  5:41 AM      Result Value Range   WBC 8.2  4.0 - 10.5 K/uL   RBC 3.98 (*) 4.22 - 5.81 MIL/uL   Hemoglobin 12.5 (*) 13.0 - 17.0 g/dL   HCT 37.7 (*) 39.0 - 52.0 %   MCV 94.7  78.0 - 100.0 fL   MCH 31.4  26.0 - 34.0 pg   MCHC 33.2  30.0 - 36.0 g/dL   RDW 13.8  11.5 - 15.5 %   Platelets 234  150 - 400 K/uL  BASIC METABOLIC PANEL     Status: Abnormal   Collection Time    11/11/13  5:41 AM      Result Value Range   Sodium 141  137 - 147 mEq/L   Potassium 3.6 (*) 3.7 - 5.3 mEq/L   Chloride 105  96 - 112 mEq/L   CO2 20  19 - 32 mEq/L   Glucose, Bld 83  70 - 99 mg/dL   BUN 12  6 - 23 mg/dL   Creatinine, Ser 0.80  0.50 - 1.35 mg/dL   Calcium 8.2 (*) 8.4 - 10.5 mg/dL   GFR calc non Af Amer >90  >90 mL/min   GFR calc Af Amer >90  >90 mL/min   Comment: (NOTE)     The eGFR has been calculated using the CKD EPI equation.     This calculation has not been validated in all clinical situations.     eGFR's persistently <90 mL/min signify possible Chronic Kidney     Disease.  LIPID PANEL     Status: Abnormal   Collection Time    11/11/13  5:41 AM      Result Value Range   Cholesterol 106  0 - 200 mg/dL   Triglycerides 112  <150 mg/dL   HDL 26 (*) >39 mg/dL   Total CHOL/HDL Ratio 4.1     VLDL 22  0 - 40 mg/dL   LDL Cholesterol 58  0 - 99 mg/dL   Comment:            Total Cholesterol/HDL:CHD Risk     Coronary Heart Disease Risk Table                         Men   Women      1/2 Average Risk   3.4   3.3      Average Risk       5.0   4.4      2 X  Average Risk   9.6   7.1      3 X Average Risk  23.4   11.0                Use the calculated Patient Ratio     above and the CHD Risk Table     to determine the patient's CHD Risk.                ATP III CLASSIFICATION (LDL):      <100     mg/dL   Optimal      100-129  mg/dL   Near or Above                        Optimal  130-159  mg/dL   Borderline      160-189  mg/dL   High      >190     mg/dL   Very High  MAGNESIUM     Status: None   Collection Time    11/11/13  5:41 AM      Result Value Range   Magnesium 1.8  1.5 - 2.5 mg/dL    Dg Chest 1 View  11/10/2013   CLINICAL DATA:  Possible pulmonary nodule  EXAM: CHEST - 1 VIEW  COMPARISON:  Study performed earlier today  FINDINGS: Lungs are clear. Heart size normal. Nipple markers indicate that the nodular density seen on the prior study represented a nipple shadow.  IMPRESSION: No pulmonary nodules.   Electronically Signed   By: Skipper Cliche M.D.   On: 11/10/2013 13:01   Dg Chest 2 View  11/10/2013   CLINICAL DATA:  Cough for 3 weeks with diarrhea  EXAM: CHEST  2 VIEW  COMPARISON:  12/16/2009  FINDINGS: Hyperinflation suggests COPD. Heart size and vascular pattern are normal. 1 cm nodular opacity over the right mid lung zone. On the left side, there are no abnormal opacities.  IMPRESSION: No evidence of pneumonia.  COPD.  Recommend repeating the PA view with nipple markers to confirm that the nodular density represents a nipple shadow.   Electronically Signed   By: Skipper Cliche M.D.   On: 11/10/2013 10:46   Ct Abdomen Pelvis W Contrast  11/10/2013   CLINICAL DATA:  Three-week history of crampy abdominal pain and diarrhea, history of HIV and MR as a  EXAM: CT ABDOMEN AND PELVIS WITH CONTRAST  TECHNIQUE: Multidetector CT imaging of the abdomen and pelvis was performed using the standard protocol following bolus administration of intravenous contrast.  CONTRAST:  152m OMNIPAQUE IOHEXOL 300 MG/ML SOLN, the patient did receive oral  contrast material.  COMPARISON:  CT scan of the abdomen and pelvis dated December 17, 2009.  FINDINGS: The liver exhibits no focal mass nor ductal dilation. The gallbladder is adequately distended with no evidence of stones or surrounding inflammatory changes. The pancreas, spleen, nondistended stomach, adrenal glands, and kidneys exhibit no acute abnormality. The caliber of the abdominal aorta is normal. There is no periaortic nor pericaval lymphadenopathy.  The orally administered contrast has traversed the small bowel and reached as far as the splenic flexure of the colon. There is mild distention of small-bowel loops in the mid and upper abdomen. In the left mid abdomen on images 31 through 41 there is a configuration of unopacified small bowel loops that may reflect intussusception. No edematous bowel loops are demonstrated.  Contrast is present within the colon as far as the splenic flexure and it appears normal. A normal calibered, contrast-filled appendix is demonstrated. There is no evidence of colitis or diverticulitis. The nondistended portion of the colon exhibits no suspicious findings. There is a moderate amount gas and some stool in the rectum.  The partially distended urinary bladder is normal in appearance. The prostate gland produces a mild impression upon the urinary bladder base. There is no evidence of ascites. There is no inguinal nor umbilical hernia. The lung bases are clear. The lumbar vertebral bodies are preserved in height. The lung bases are clear.  IMPRESSION: 1. The appearance of the proximal small bowel may reflect mild obstruction or ileus secondary to what appears to be an area of intussusception or possible internal hernia in the left mid abdomen. There is no complete obstruction since  contrast has proceeded is far distally as the splenic flexure of the colon. 2. There is no evidence of inflammation of the colon. There is no acute appendicitis or evidence of diverticulitis. 3.  There is no evidence of acute hepatobiliary or acute urinary tract abnormality. 4. There is no evidence of ascites. No intra-abdominal or pelvic lymphadenopathy is demonstrated.   Electronically Signed   By: David  Martinique   On: 11/10/2013 13:00   Dg Abd Portable 1v  11/11/2013   CLINICAL DATA:  Rule out obstruction.  Chronic diarrhea.  HIV.  EXAM: PORTABLE ABDOMEN - 1 VIEW  COMPARISON:  CT 11/10/2013  FINDINGS: Gas in nondilated large and small bowel loops. Negative for bowel obstruction. Negative for bowel thickening or pneumatosis. No mass or soft tissue calcifications.  IMPRESSION: Negative   Electronically Signed   By: Franchot Gallo M.D.   On: 11/11/2013 09:26    Review of Systems  All other systems reviewed and are negative.   Blood pressure 97/56, pulse 88, temperature 98.4 F (36.9 C), temperature source Oral, resp. rate 18, height '6\' 6"'  (1.981 m), weight 168 lb 14 oz (76.6 kg), SpO2 98.00%. Physical Exam  Constitutional: He is oriented to person, place, and time. He appears well-developed and well-nourished. No distress.  Neck: Normal range of motion. Neck supple.  Cardiovascular: Normal rate, regular rhythm, normal heart sounds and intact distal pulses.  Exam reveals no gallop and no friction rub.   No murmur heard. Respiratory: Effort normal and breath sounds normal. No respiratory distress. He has no wheezes. He has no rales. He exhibits no tenderness.  GI: Soft. Bowel sounds are normal. He exhibits no distension and no mass. There is no tenderness. There is no rebound and no guarding.  Musculoskeletal: He exhibits no edema.  Lymphadenopathy:    He has no cervical adenopathy.  Neurological: He is alert and oriented to person, place, and time.  Skin: Skin is warm and dry. No rash noted. He is not diaphoretic. No erythema. No pallor.  Psychiatric: He has a normal mood and affect. His behavior is normal. Judgment and thought content normal.     Assessment/Plan: Diarrhea HIV PCM Medical non compliance  Clinically the patient does not appear obstructed.  He has a benign abdominal exam, non distended, non tender and has had multiple bowel movements/diarrhea per day.  I do not suspect he has intussusception or internal hernia.  If this remains a concern, I would recommend a GI consultation for upper gi or a capsule endoscopy.  Lastly, he has a family history of crohn's disease, consider further work up if his symptoms are persistent and infectious etiology has been ruled out.  Surgery will sign off, please do not hesitate to contact [facility] with questions or concerns.   Erby Pian 11/11/2013, 1:04 PM

## 2013-11-11 NOTE — Progress Notes (Signed)
INITIAL NUTRITION ASSESSMENT  DOCUMENTATION CODES Per approved criteria  -Non-severe (moderate) malnutrition in the context of chronic illness  Pt meets criteria for MODERATE MALNUTRITION in the context of Chronic illness as evidenced by 9% weight loss in less than 2 months and signs of mild muscle and fat wasting evidenced in physical exam.  INTERVENTION: Diet advancement per MD discretion Will provide supplements BID when diet advanced  NUTRITION DIAGNOSIS: Unintentional weight loss related to diarrhea as evidenced by 9% weight loss in less than 2 months per pt report.   Goal: Pt to meet >/= 90% of their estimated nutrition needs   Monitor:  Diet advancement PO intake Weight trends Labs  Reason for Assessment: Malnutrition Screening Tool, score of 2  38 y.o. male  Admitting Dx: Diarrhea  ASSESSMENT: 38 year old man with past medical history of HIV infection (last CD4 300 viral load 50 copies 05/2011) with noncompliance with medications/follow-up care who presents with diarrhea of 3 week duration. Pt reports that for the past 3 weeks he has been having diarrhea consisting of frequent watery stools (3-4/hr), at times foamy that is unrelieved with fasting or worsened with food.   Pt states that he usually weighs 185 lbs, last weighing 185 lbs the beginning of December 2014.Pt states he has a good appetite and was eating 3 or more meals daily PTA. Pt has lost 9% of his body weight within the past 2 months.  He relates weight loss to diarrhea. Pt is currently NPO and desires to eat.   Nutrition Focused Physical Exam:  Subcutaneous Fat:  Orbital Region: wnl Upper Arm Region: mild wasting Thoracic and Lumbar Region: NA  Muscle:  Temple Region: moderate wasting Clavicle Bone Region: mild wasting Clavicle and Acromion Bone Region: wnl Scapular Bone Region: NA Dorsal Hand: mild wasting Patellar Region: moderate wasting Anterior Thigh Region: mild wasting Posterior Calf Region:  mild wasting  Edema: none  Height: Ht Readings from Last 1 Encounters:  11/10/13 6\' 6"  (1.981 m)    Weight: Wt Readings from Last 1 Encounters:  11/10/13 168 lb 14 oz (76.6 kg)    Ideal Body Weight: 214 lbs  % Ideal Body Weight: 79%  Wt Readings from Last 10 Encounters:  11/10/13 168 lb 14 oz (76.6 kg)  09/14/11 190 lb (86.183 kg)  04/14/11 183 lb (83.008 kg)  02/25/11 177 lb 8 oz (80.513 kg)  01/25/11 174 lb (78.926 kg)  08/21/09 182 lb 2.1 oz (82.614 kg)  07/31/09 182 lb 9.6 oz (82.827 kg)  03/20/09 175 lb 9.6 oz (79.652 kg)  01/16/09 171 lb 12.8 oz (77.928 kg)  01/02/09 164 lb (74.39 kg)    Usual Body Weight: 185 lbs  % Usual Body Weight: 91%  BMI:  Body mass index is 19.52 kg/(m^2).  Estimated Nutritional Needs: Kcal: 2400-2800 Protein: 90-105 grams Fluid: 2.3 L/day  Skin: WDL  Diet Order: NPO  EDUCATION NEEDS: -No education needs identified at this time   Intake/Output Summary (Last 24 hours) at 11/11/13 1217 Last data filed at 11/11/13 0600  Gross per 24 hour  Intake 4437.5 ml  Output   1000 ml  Net 3437.5 ml    Last BM: 1/19   Labs:   Recent Labs Lab 11/10/13 0940 11/11/13 0541  NA 139 141  K 4.1 3.6*  CL 99 105  CO2 28 20  BUN 14 12  CREATININE 0.91 0.80  CALCIUM 8.6 8.2*  MG  --  1.8  GLUCOSE 115* 83    CBG (last 3)  No results found for this basename: GLUCAP,  in the last 72 hours  Scheduled Meds: . antiseptic oral rinse  15 mL Mouth Rinse q12n4p  . chlorhexidine  15 mL Mouth Rinse BID  . Darunavir Ethanolate  800 mg Oral QHS  . emtricitabine-tenofovir  1 tablet Oral QHS  . enoxaparin (LOVENOX) injection  40 mg Subcutaneous Q24H  . nicotine  21 mg Transdermal Daily  . ritonavir  100 mg Oral QHS  . sodium chloride  1,000 mL Intravenous Once  . sulfamethoxazole-trimethoprim  1 tablet Oral Daily    Continuous Infusions: . sodium chloride 100 mL/hr at 11/11/13 0945    Past Medical History  Diagnosis Date  . HIV  positive   . MRSA infection   . Cellulitis     Past Surgical History  Procedure Laterality Date  . Hand / finger lesion excision      Ian Malkineanne Barnett RD, LDN Inpatient Clinical Dietitian Pager: [phone] After Hours Pager: [phone]

## 2013-11-11 NOTE — Consult Note (Signed)
I have seen and examined the patient and agree with the assessment and plans.  He does not have a surgical abdomen and has no current abdominal pain.  I suspect CT findings may be over called.  Nothing currently to do from a surgical standpoint.   Should consider outpt GI referral and maybe either a small bowel follow through or capsule endoscopy.  Will sign off.  Please call us back if needed.  Margit Batte A. Magnus IvanBlackman  MD, FACS

## 2013-11-11 NOTE — Progress Notes (Signed)
Subjective:   Pt seen and examined in AM. Pt could not tolerate NG suction last night. Pt reports he no longer has abdominal pain. He reports more solid formed stool last night but again liquid this morning. He has had 4-5 bowel movements since yesterday. He denies fever, chills, nausea, vomiting, lightheadedness, or change in urination. He reports having 20 lb unintentional weight loss in the past month.    Objective: Vital signs in last 24 hours: Filed Vitals:   11/10/13 2307 11/11/13 0113 11/11/13 0509 11/11/13 1051  BP: 103/66 93/57 101/61 97/56  Pulse: 72 76 101 88  Temp: 98.6 F (37 C) 98.5 F (36.9 C) 98.9 F (37.2 C) 98.4 F (36.9 C)  TempSrc: Oral Oral Oral Oral  Resp: 20 20 20 18   Height:      Weight:      SpO2: 100% 98% 96% 98%   Weight change:   Intake/Output Summary (Last 24 hours) at 11/11/13 1156 Last data filed at 11/11/13 0600  Gross per 24 hour  Intake 4437.5 ml  Output   1000 ml  Net 3437.5 ml   Constitutional: He is oriented to person, place, and time. No distress.  Thin appearing  HENT:  Head: Normocephalic and atraumatic.  Right Ear: External ear normal.  Nose: Nose normal.  Mouth/Throat: Oropharynx is clear and moist. No oropharyngeal exudate.  No oral thrush  Eyes: Conjunctivae and EOM are normal. Neck: Normal range of motion. Neck supple.  Cardiovascular: Normal rate, regular rhythm and normal heart sounds.  Pulmonary/Chest: Effort normal and breath sounds normal. No respiratory distress. He has no wheezes. He has no rales. He exhibits no tenderness.  Abdominal: Bowel sounds are normal. There is no abdominal tenderness or guarding  Musculoskeletal: Normal range of motion. He exhibits no edema and no tenderness.  Neurological: He is alert and oriented to person, place, and time.  Skin: Skin is warm and dry. No rash noted. Marland Kitchen  Psychiatric: He has a normal mood and affect. His behavior is normal. Thought content normal.    Lab  Results: Basic Metabolic Panel:  Recent Labs Lab 11/10/13 0940 11/11/13 0541  NA 139 141  K 4.1 3.6*  CL 99 105  CO2 28 20  GLUCOSE 115* 83  BUN 14 12  CREATININE 0.91 0.80  CALCIUM 8.6 8.2*  MG  --  1.8   Liver Function Tests:  Recent Labs Lab 11/10/13 0940  AST 16  ALT 13  ALKPHOS 86  BILITOT 0.5  PROT 8.0  ALBUMIN 3.5    Recent Labs Lab 11/10/13 0940  LIPASE 24   No results found for this basename: AMMONIA,  in the last 168 hours CBC:  Recent Labs Lab 11/10/13 0940 11/11/13 0541  WBC 6.7 8.2  NEUTROABS 4.8  --   HGB 14.0 12.5*  HCT 41.7 37.7*  MCV 95.2 94.7  PLT 257 234   Cardiac Enzymes:  Recent Labs Lab 11/10/13 0940  TROPONINI <0.30   BNP: No results found for this basename: PROBNP,  in the last 168 hours D-Dimer: No results found for this basename: DDIMER,  in the last 168 hours CBG: No results found for this basename: GLUCAP,  in the last 168 hours Hemoglobin A1C: No results found for this basename: HGBA1C,  in the last 168 hours Fasting Lipid Panel:  Recent Labs Lab 11/11/13 0541  CHOL 106  HDL 26*  LDLCALC 58  TRIG 161  CHOLHDL 4.1   Thyroid Function Tests: No results found for  this basename: TSH, T4TOTAL, FREET4, T3FREE, THYROIDAB,  in the last 168 hours Coagulation: No results found for this basename: LABPROT, INR,  in the last 168 hours Anemia Panel: No results found for this basename: VITAMINB12, FOLATE, FERRITIN, TIBC, IRON, RETICCTPCT,  in the last 168 hours Urine Drug Screen: Drugs of Abuse     Component Value Date/Time   LABOPIA POSITIVE* 01/05/2009 1519   COCAINSCRNUR POSITIVE* 01/05/2009 1519   LABBENZ NONE DETECTED 01/05/2009 1519   AMPHETMU POSITIVE* 01/05/2009 1519   THCU NONE DETECTED 01/05/2009 1519   LABBARB  Value: NONE DETECTED        DRUG SCREEN FOR MEDICAL PURPOSES ONLY.  IF CONFIRMATION IS NEEDED FOR ANY PURPOSE, NOTIFY LAB WITHIN 5 DAYS.        LOWEST DETECTABLE LIMITS FOR URINE DRUG SCREEN Drug Class        Cutoff (ng/mL) Amphetamine      1000 Barbiturate      200 Benzodiazepine   200 Tricyclics       300 Opiates          300 Cocaine          300 THC              50 01/05/2009 1519    Alcohol Level: No results found for this basename: ETH,  in the last 168 hours Urinalysis:  Recent Labs Lab 11/10/13 1001  COLORURINE AMBER*  LABSPEC 1.034*  PHURINE 6.0  GLUCOSEU NEGATIVE  HGBUR NEGATIVE  BILIRUBINUR SMALL*  KETONESUR 15*  PROTEINUR NEGATIVE  UROBILINOGEN 1.0  NITRITE NEGATIVE  LEUKOCYTESUR SMALL*    Micro Results: Recent Results (from the past 240 hour(s))  STOOL CULTURE     Status: None   Collection Time    11/10/13 12:13 PM      Result Value Range Status   Specimen Description STOOL   Final   Special Requests NONE   Final   Culture     Final   Value: Culture reincubated for better growth     Performed at [facility]   Report Status PENDING   Incomplete  CLOSTRIDIUM DIFFICILE BY PCR     Status: None   Collection Time    11/10/13 12:14 PM      Result Value Range Status   C difficile by pcr NEGATIVE  NEGATIVE Final   Studies/Results: Dg Chest 1 View  11/10/2013   CLINICAL DATA:  Possible pulmonary nodule  EXAM: CHEST - 1 VIEW  COMPARISON:  Study performed earlier today  FINDINGS: Lungs are clear. Heart size normal. Nipple markers indicate that the nodular density seen on the prior study represented a nipple shadow.  IMPRESSION: No pulmonary nodules.   Electronically Signed   By: Esperanza Heir M.D.   On: 11/10/2013 13:01   Dg Chest 2 View  11/10/2013   CLINICAL DATA:  Cough for 3 weeks with diarrhea  EXAM: CHEST  2 VIEW  COMPARISON:  12/16/2009  FINDINGS: Hyperinflation suggests COPD. Heart size and vascular pattern are normal. 1 cm nodular opacity over the right mid lung zone. On the left side, there are no abnormal opacities.  IMPRESSION: No evidence of pneumonia.  COPD.  Recommend repeating the PA view with nipple markers to confirm that the nodular density  represents a nipple shadow.   Electronically Signed   By: Esperanza Heir M.D.   On: 11/10/2013 10:46   Ct Abdomen Pelvis W Contrast  11/10/2013   CLINICAL DATA:  Three-week history of crampy  abdominal pain and diarrhea, history of HIV and MR as a  EXAM: CT ABDOMEN AND PELVIS WITH CONTRAST  TECHNIQUE: Multidetector CT imaging of the abdomen and pelvis was performed using the standard protocol following bolus administration of intravenous contrast.  CONTRAST:  100mL OMNIPAQUE IOHEXOL 300 MG/ML SOLN, the patient did receive oral contrast material.  COMPARISON:  CT scan of the abdomen and pelvis dated December 17, 2009.  FINDINGS: The liver exhibits no focal mass nor ductal dilation. The gallbladder is adequately distended with no evidence of stones or surrounding inflammatory changes. The pancreas, spleen, nondistended stomach, adrenal glands, and kidneys exhibit no acute abnormality. The caliber of the abdominal aorta is normal. There is no periaortic nor pericaval lymphadenopathy.  The orally administered contrast has traversed the small bowel and reached as far as the splenic flexure of the colon. There is mild distention of small-bowel loops in the mid and upper abdomen. In the left mid abdomen on images 31 through 41 there is a configuration of unopacified small bowel loops that may reflect intussusception. No edematous bowel loops are demonstrated.  Contrast is present within the colon as far as the splenic flexure and it appears normal. A normal calibered, contrast-filled appendix is demonstrated. There is no evidence of colitis or diverticulitis. The nondistended portion of the colon exhibits no suspicious findings. There is a moderate amount gas and some stool in the rectum.  The partially distended urinary bladder is normal in appearance. The prostate gland produces a mild impression upon the urinary bladder base. There is no evidence of ascites. There is no inguinal nor umbilical hernia. The lung bases  are clear. The lumbar vertebral bodies are preserved in height. The lung bases are clear.  IMPRESSION: 1. The appearance of the proximal small bowel may reflect mild obstruction or ileus secondary to what appears to be an area of intussusception or possible internal hernia in the left mid abdomen. There is no complete obstruction since contrast has proceeded is far distally as the splenic flexure of the colon. 2. There is no evidence of inflammation of the colon. There is no acute appendicitis or evidence of diverticulitis. 3. There is no evidence of acute hepatobiliary or acute urinary tract abnormality. 4. There is no evidence of ascites. No intra-abdominal or pelvic lymphadenopathy is demonstrated.   Electronically Signed   By: David  [place]   On: 11/10/2013 13:00   Dg Abd Portable 1v  11/11/2013   CLINICAL DATA:  Rule out obstruction.  Chronic diarrhea.  HIV.  EXAM: PORTABLE ABDOMEN - 1 VIEW  COMPARISON:  CT 11/10/2013  FINDINGS: Gas in nondilated large and small bowel loops. Negative for bowel obstruction. Negative for bowel thickening or pneumatosis. No mass or soft tissue calcifications.  IMPRESSION: Negative   Electronically Signed   By: Marlan Palauharles  Clark M.D.   On: 11/11/2013 09:26   Medications: I have reviewed the patient's current medications. Scheduled Meds: . antiseptic oral rinse  15 mL Mouth Rinse q12n4p  . chlorhexidine  15 mL Mouth Rinse BID  . Darunavir Ethanolate  800 mg Oral QHS  . emtricitabine-tenofovir  1 tablet Oral QHS  . enoxaparin (LOVENOX) injection  40 mg Subcutaneous Q24H  . nicotine  21 mg Transdermal Daily  . ritonavir  100 mg Oral QHS  . sodium chloride  1,000 mL Intravenous Once  . sulfamethoxazole-trimethoprim  1 tablet Oral Daily   Continuous Infusions: . sodium chloride 100 mL/hr at 11/11/13 0945   PRN Meds:.acetaminophen, acetaminophen, ondansetron (ZOFRAN) IV, ondansetron  Assessment: 38 year old man with past medical history of HIV infection (last CD4 300  viral load 50 copies 05/2011) with noncompliance with medications/follow-up care who presents with diarrhea of 3 week duration and found to have possible short bowel obstruction on CT imaging.   Plan:    Persistent Diarrhea - Pt with 3 week history of diarrhea in setting of unmanaged HIV infection concerning for opportunistic infection (cryptosporidium if CD<100, MAC, CMV<50). Etiologies include infectious (afebrile, no leukocytosis, however immunocompromised) vs secretory vs osmotic (however no response to fasting) vs malabsorption (no reported fatty stools) vs inflammatory (no evidence of blood). Pt with no recent antibiotic use and c. diff testing was negative.  -Contact enteric precautions  -Obtain CD4 count --> 340, less concern for opportunistic infection  -Obtain GI pathogen panel  -Obtain stool culture  -Obtain O & P  -Obtain fecal lactoferrin  -Obtain fecal fat -Per ID, start empiric IV metronidazole   Possible Proximal Partial Small Bowel Obstruction vs Mild Ileus- resolved following NG tube. Currently without abdominal pain or peritoneal signs. Abdominal xray today without evidence of obstruction.  -Advance to clear liquids   -D/C IVF's  -Replace potassium as needed  -Monitor for peritoneal signs  -Appreciate surgery consult --> does not appear obstructed, if continues to persist recommend outpatient GI referral for upper gi or a capsule endoscopy.    HIV Infection - Pt with HIV infection (last CD4 300 viral load 50 copies 05/2011) with no follow-up care since 2012 who is non-compliant with medications (recently started 1 week ago).  -Obtain CD4 and viral load --> CD4 340  -Continue home prezista 800mg  daily  -Continue truvada 200-300 mg daily  -Continue home ritonavir 100 mg daily  -Continue bactrim DS 800-160 mg daily  -Appreciate ID consult --> start IV 500 mg metronidazole TID, obtain RPR  Hypokalemia - Pt with K of 3.6 today most likely due to GI losses -Replace with  potassium chloride -Obtain Mg --> wnl   -Continue to monitor   Normocytic Anemia - stable with no active bleeding or hemodynamic instability.  Pt with Hg 12.5 with baseline 14. -Monitor for bleeding -Continue to monitor CBC  Malnutrition - Pt with BMI approx 20. Pt reports recent 20lb weight loss in last month. Albumin levels within normal limits.  -Obtain nutrition consult  Hyperlipidemia - Pt with last lipid panel on 06/21/11 with hypertriglyceridemia (231).  -Obtain lipid panel --> wnl  -Obtain HbA1c --> 5.4  Tobacco Abuse - Pt with 1 pack/day history for 19 years.  -Continue nicotine patch 21 mg  -Obtain UDS  Diet: clear liquids  DVT Ppx: Lovenox  Code: Full     Dispo: Disposition is deferred at this time, awaiting improvement of current medical problems.  Anticipated discharge in approximately 2-3 day(s).   The patient does not have a current PCP (No Pcp Per Patient) and does need an [facility] hospital follow-up appointment after discharge.  The patient does not have transportation limitations that hinder transportation to clinic appointments.  .Services Needed at time of discharge: Y = Yes, Blank = No PT:   OT:   RN:   Equipment:   Other:     LOS: 1 day   Gabriel Brace, MD 11/11/2013, 11:56 AM

## 2013-11-11 NOTE — Consult Note (Signed)
  [facility]  Total days of antibiotics 0               Reason for Consult: diarrhea in HIV patient    Referring Physician: joines  Principal Problem:   Diarrhea Active Problems:   HIV DISEASE   TOBACCO USER   Ileus    HPI: Gabriel [name] is a 38 y.o. male with HIV disease, CD 4 count 300 and VL 50 (in 05/2011), last seen in [facility] in Fall 2012, reports taking truvada/darunavir/ritonavir adn bactrim DS, restarted old HIV medications, likely expired, last 3-4 wks but now presented to the ED for 3 wk history of profuse diarrhea with associated abdominal cramping. No blood in stool, no N/V, no sick contact, no recent travel. maintains to stay hydrated but came in to be evaluated due to pre-syncopal feelings. He underwent abd ct that showed mild ileus in small bowel where he had NG placed until this morning, removed due to discomfort. abd xray shows resolution of ileus. Blood work and stool studies pending  He does have a new partner in the last 4 wks. He states that he fell out of care due to not having rides coming from [place]  Past Medical History  Diagnosis Date  . HIV positive   . MRSA infection   . Cellulitis     Allergies: No Known Allergies   MEDICATIONS: . antiseptic oral rinse  15 mL Mouth Rinse q12n4p  . chlorhexidine  15 mL Mouth Rinse BID  . Darunavir Ethanolate  800 mg Oral QHS  . emtricitabine-tenofovir  1 tablet Oral QHS  . enoxaparin (LOVENOX) injection  40 mg Subcutaneous Q24H  . nicotine  21 mg Transdermal Daily  . ritonavir  100 mg Oral QHS  . sodium chloride  1,000 mL Intravenous Once  . sulfamethoxazole-trimethoprim  1 tablet Oral Daily    History  Substance Use Topics  . Smoking status: Current Every Day Smoker -- 1.00 packs/day    Types: Cigarettes  . Smokeless tobacco: Never Used  . Alcohol Use: 1.5 oz/week    3 drink(s) per week    Family History  Problem Relation Age of Onset  . Cancer Maternal Grandmother      Review of Systems  Constitutional: Negative for fever, chills, diaphoresis, activity change, appetite change, fatigue and unexpected weight change.  HENT: Negative for congestion, sore throat, rhinorrhea, sneezing, trouble swallowing and sinus pressure.  Eyes: Negative for photophobia and visual disturbance.  Respiratory: Negative for cough, chest tightness, shortness of breath, wheezing and stridor.  Cardiovascular: Negative for chest pain, palpitations and leg swelling.  Gastrointestinal: positive for diarrhea.Negative for nausea, vomiting,  constipation, blood in stool, abdominal distention and anal bleeding.  Genitourinary: Negative for dysuria, hematuria, flank pain and difficulty urinating.  Musculoskeletal: Negative for myalgias, back pain, joint swelling, arthralgias and gait problem.  Skin: Negative for color change, pallor, rash and wound.  Neurological: Negative for dizziness, tremors, weakness and light-headedness.  Hematological: Negative for adenopathy. Does not bruise/bleed easily.  Psychiatric/Behavioral: Negative for behavioral problems, confusion, sleep disturbance, dysphoric mood, decreased concentration and agitation.     OBJECTIVE: Temp:  [97.7 F (36.5 C)-98.9 F (37.2 C)] 98.4 F (36.9 C) (01/19 1051) Pulse Rate:  [66-101] 88 (01/19 1051) Resp:  [14-27] 18 (01/19 1051) BP: (93-115)/(56-78) 97/56 mmHg (01/19 1051) SpO2:  [96 %-100 %] 98 % (01/19 1051) Weight:  [168 lb 14 oz (76.6 kg)] 168 lb 14 oz (76.6 kg) (01/18 1758)  Constitutional:   He is oriented to person, place, and time. He appears mildly cachetic. No distress.  HENT:  Mouth/Throat: Oropharynx is clear and moist. No oropharyngeal exudate. Hallitosis. No thrush Cardiovascular: Normal rate, regular rhythm and normal heart sounds. Exam reveals no gallop and no friction rub.  No murmur heard.  Pulmonary/Chest: Effort normal and breath sounds normal. No respiratory distress. He has no wheezes.    Abdominal: Soft. Bowel sounds are normal. He exhibits no distension. There is no tenderness.  Lymphadenopathy:  He has no cervical adenopathy.  Neurological: He is alert and oriented to person, place, and time.  Skin: Skin is warm and dry. No rash noted. No erythema.  Psychiatric: He has a normal mood and affect. His behavior is normal.    LABS: Results for orders placed during the hospital encounter of 11/10/13 (from the past 48 hour(s))  CBC WITH DIFFERENTIAL     Status: None   Collection Time    11/10/13  9:40 AM      Result Value Range   WBC 6.7  4.0 - 10.5 K/uL   RBC 4.38  4.22 - 5.81 MIL/uL   Hemoglobin 14.0  13.0 - 17.0 g/dL   HCT 41.7  39.0 - 52.0 %   MCV 95.2  78.0 - 100.0 fL   MCH 32.0  26.0 - 34.0 pg   MCHC 33.6  30.0 - 36.0 g/dL   RDW 13.8  11.5 - 15.5 %   Platelets 257  150 - 400 K/uL   Neutrophils Relative % 71  43 - 77 %   Neutro Abs 4.8  1.7 - 7.7 K/uL   Lymphocytes Relative 18  12 - 46 %   Lymphs Abs 1.2  0.7 - 4.0 K/uL   Monocytes Relative 9  3 - 12 %   Monocytes Absolute 0.6  0.1 - 1.0 K/uL   Eosinophils Relative 1  0 - 5 %   Eosinophils Absolute 0.1  0.0 - 0.7 K/uL   Basophils Relative 1  0 - 1 %   Basophils Absolute 0.0  0.0 - 0.1 K/uL  COMPREHENSIVE METABOLIC PANEL     Status: Abnormal   Collection Time    11/10/13  9:40 AM      Result Value Range   Sodium 139  137 - 147 mEq/L   Potassium 4.1  3.7 - 5.3 mEq/L   Chloride 99  96 - 112 mEq/L   CO2 28  19 - 32 mEq/L   Glucose, Bld 115 (*) 70 - 99 mg/dL   BUN 14  6 - 23 mg/dL   Creatinine, Ser 0.91  0.50 - 1.35 mg/dL   Calcium 8.6  8.4 - 10.5 mg/dL   Total Protein 8.0  6.0 - 8.3 g/dL   Albumin 3.5  3.5 - 5.2 g/dL   AST 16  0 - 37 U/L   ALT 13  0 - 53 U/L   Alkaline Phosphatase 86  39 - 117 U/L   Total Bilirubin 0.5  0.3 - 1.2 mg/dL   GFR calc non Af Amer >90  >90 mL/min   GFR calc Af Amer >90  >90 mL/min   Comment: (NOTE)     The eGFR has been calculated using the CKD EPI equation.     This  calculation has not been validated in all clinical situations.     eGFR's persistently <90 mL/min signify possible Chronic Kidney     Disease.  LIPASE, BLOOD     Status: None   Collection   Time    11/10/13  9:40 AM      Result Value Range   Lipase 24  11 - 59 U/L  TROPONIN I     Status: None   Collection Time    11/10/13  9:40 AM      Result Value Range   Troponin I <0.30  <0.30 ng/mL   Comment:            Due to the release kinetics of cTnI,     a negative result within the first hours     of the onset of symptoms does not rule out     myocardial infarction with certainty.     If myocardial infarction is still suspected,     repeat the test at appropriate intervals.  URINALYSIS, ROUTINE W REFLEX MICROSCOPIC     Status: Abnormal   Collection Time    11/10/13 10:01 AM      Result Value Range   Color, Urine AMBER (*) YELLOW   Comment: BIOCHEMICALS MAY BE AFFECTED BY COLOR   APPearance CLOUDY (*) CLEAR   Specific Gravity, Urine 1.034 (*) 1.005 - 1.030   pH 6.0  5.0 - 8.0   Glucose, UA NEGATIVE  NEGATIVE mg/dL   Hgb urine dipstick NEGATIVE  NEGATIVE   Bilirubin Urine SMALL (*) NEGATIVE   Ketones, ur 15 (*) NEGATIVE mg/dL   Protein, ur NEGATIVE  NEGATIVE mg/dL   Urobilinogen, UA 1.0  0.0 - 1.0 mg/dL   Nitrite NEGATIVE  NEGATIVE   Leukocytes, UA SMALL (*) NEGATIVE  URINE MICROSCOPIC-ADD ON     Status: None   Collection Time    11/10/13 10:01 AM      Result Value Range   Squamous Epithelial / LPF RARE  RARE   WBC, UA 3-6  <3 WBC/hpf   Urine-Other MUCOUS PRESENT    STOOL CULTURE     Status: None   Collection Time    11/10/13 12:13 PM      Result Value Range   Specimen Description STOOL     Special Requests NONE     Culture       Value: Culture reincubated for better growth     Performed at [facility]   Report Status PENDING    CLOSTRIDIUM DIFFICILE BY PCR     Status: None   Collection Time    11/10/13 12:14 PM      Result Value Range   C difficile by pcr  NEGATIVE  NEGATIVE  CBC     Status: Abnormal   Collection Time    11/11/13  5:41 AM      Result Value Range   WBC 8.2  4.0 - 10.5 K/uL   RBC 3.98 (*) 4.22 - 5.81 MIL/uL   Hemoglobin 12.5 (*) 13.0 - 17.0 g/dL   HCT 37.7 (*) 39.0 - 52.0 %   MCV 94.7  78.0 - 100.0 fL   MCH 31.4  26.0 - 34.0 pg   MCHC 33.2  30.0 - 36.0 g/dL   RDW 13.8  11.5 - 15.5 %   Platelets 234  150 - 400 K/uL  BASIC METABOLIC PANEL     Status: Abnormal   Collection Time    11/11/13  5:41 AM      Result Value Range   Sodium 141  137 - 147 mEq/L   Potassium 3.6 (*) 3.7 - 5.3 mEq/L   Chloride 105  96 - 112 mEq/L   CO2 20  19 -  32 mEq/L   Glucose, Bld 83  70 - 99 mg/dL   BUN 12  6 - 23 mg/dL   Creatinine, Ser 0.80  0.50 - 1.35 mg/dL   Calcium 8.2 (*) 8.4 - 10.5 mg/dL   GFR calc non Af Amer >90  >90 mL/min   GFR calc Af Amer >90  >90 mL/min   Comment: (NOTE)     The eGFR has been calculated using the CKD EPI equation.     This calculation has not been validated in all clinical situations.     eGFR's persistently <90 mL/min signify possible Chronic Kidney     Disease.  LIPID PANEL     Status: Abnormal   Collection Time    11/11/13  5:41 AM      Result Value Range   Cholesterol 106  0 - 200 mg/dL   Triglycerides 112  <150 mg/dL   HDL 26 (*) >39 mg/dL   Total CHOL/HDL Ratio 4.1     VLDL 22  0 - 40 mg/dL   LDL Cholesterol 58  0 - 99 mg/dL   Comment:            Total Cholesterol/HDL:CHD Risk     Coronary Heart Disease Risk Table                         Men   Women      1/2 Average Risk   3.4   3.3      Average Risk       5.0   4.4      2 X Average Risk   9.6   7.1      3 X Average Risk  23.4   11.0                Use the calculated Patient Ratio     above and the CHD Risk Table     to determine the patient's CHD Risk.                ATP III CLASSIFICATION (LDL):      <100     mg/dL   Optimal      100-129  mg/dL   Near or Above                        Optimal      130-159  mg/dL   Borderline       160-189  mg/dL   High      >190     mg/dL   Very High  MAGNESIUM     Status: None   Collection Time    11/11/13  5:41 AM      Result Value Range   Magnesium 1.8  1.5 - 2.5 mg/dL    MICRO: 1/18 afb blood cx pending 1/18 cdiff negative  1/18 stool cx pending 1/18 stool pcr panel pending  IMAGING: Dg Chest 1 View  11/10/2013   CLINICAL DATA:  Possible pulmonary nodule  EXAM: CHEST - 1 VIEW  COMPARISON:  Study performed earlier today  FINDINGS: Lungs are clear. Heart size normal. Nipple markers indicate that the nodular density seen on the prior study represented a nipple shadow.  IMPRESSION: No pulmonary nodules.   Electronically Signed   By: Skipper Cliche M.D.   On: 11/10/2013 13:01   Dg Chest 2 View  11/10/2013   CLINICAL DATA:  Cough for 3 weeks with  diarrhea  EXAM: CHEST  2 VIEW  COMPARISON:  12/16/2009  FINDINGS: Hyperinflation suggests COPD. Heart size and vascular pattern are normal. 1 cm nodular opacity over the right mid lung zone. On the left side, there are no abnormal opacities.  IMPRESSION: No evidence of pneumonia.  COPD.  Recommend repeating the PA view with nipple markers to confirm that the nodular density represents a nipple shadow.   Electronically Signed   By: Skipper Cliche M.D.   On: 11/10/2013 10:46   Ct Abdomen Pelvis W Contrast  11/10/2013   CLINICAL DATA:  Three-week history of crampy abdominal pain and diarrhea, history of HIV and MR as a  EXAM: CT ABDOMEN AND PELVIS WITH CONTRAST  TECHNIQUE: Multidetector CT imaging of the abdomen and pelvis was performed using the standard protocol following bolus administration of intravenous contrast.  CONTRAST:  171m OMNIPAQUE IOHEXOL 300 MG/ML SOLN, the patient did receive oral contrast material.  COMPARISON:  CT scan of the abdomen and pelvis dated December 17, 2009.  FINDINGS: The liver exhibits no focal mass nor ductal dilation. The gallbladder is adequately distended with no evidence of stones or surrounding inflammatory  changes. The pancreas, spleen, nondistended stomach, adrenal glands, and kidneys exhibit no acute abnormality. The caliber of the abdominal aorta is normal. There is no periaortic nor pericaval lymphadenopathy.  The orally administered contrast has traversed the small bowel and reached as far as the splenic flexure of the colon. There is mild distention of small-bowel loops in the mid and upper abdomen. In the left mid abdomen on images 31 through 41 there is a configuration of unopacified small bowel loops that may reflect intussusception. No edematous bowel loops are demonstrated.  Contrast is present within the colon as far as the splenic flexure and it appears normal. A normal calibered, contrast-filled appendix is demonstrated. There is no evidence of colitis or diverticulitis. The nondistended portion of the colon exhibits no suspicious findings. There is a moderate amount gas and some stool in the rectum.  The partially distended urinary bladder is normal in appearance. The prostate gland produces a mild impression upon the urinary bladder base. There is no evidence of ascites. There is no inguinal nor umbilical hernia. The lung bases are clear. The lumbar vertebral bodies are preserved in height. The lung bases are clear.  IMPRESSION: 1. The appearance of the proximal small bowel may reflect mild obstruction or ileus secondary to what appears to be an area of intussusception or possible internal hernia in the left mid abdomen. There is no complete obstruction since contrast has proceeded is far distally as the splenic flexure of the colon. 2. There is no evidence of inflammation of the colon. There is no acute appendicitis or evidence of diverticulitis. 3. There is no evidence of acute hepatobiliary or acute urinary tract abnormality. 4. There is no evidence of ascites. No intra-abdominal or pelvic lymphadenopathy is demonstrated.   Electronically Signed   By: David  JMartinique  On: 11/10/2013 13:00   Dg Abd  Portable 1v  11/11/2013   CLINICAL DATA:  Rule out obstruction.  Chronic diarrhea.  HIV.  EXAM: PORTABLE ABDOMEN - 1 VIEW  COMPARISON:  CT 11/10/2013  FINDINGS: Gas in nondilated large and small bowel loops. Negative for bowel obstruction. Negative for bowel thickening or pneumatosis. No mass or soft tissue calcifications.  IMPRESSION: Negative   Electronically Signed   By: CFranchot GalloM.D.   On: 11/11/2013 09:26   Assessment/Plan:  89yoM with HIV  disease, poorly controlled, recently restarted on truvada/darunavir/ritonavir and bactrim DS who reports having 3 wk history of diarrhea. abd Ct showing concern for mild obstruction possible intussusception of small bowel.  Diarrhea = await stool studies. Would do a trial of empiric metronidazole IV for now. Awaiting to see if this is cryptosporidium or giardia.  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 Can start with ensure supplementation  Social issues = please get social worker to help him with contacting work to do fmla so that he doesn't lose his job.   B.  MD MPH [facility] [phone]   

## 2013-11-12 ENCOUNTER — Encounter: Payer: Self-pay | Admitting: Internal Medicine

## 2013-11-12 DIAGNOSIS — A071 Giardiasis [lambliasis]: Principal | ICD-10-CM

## 2013-11-12 DIAGNOSIS — B20 Human immunodeficiency virus [HIV] disease: Secondary | ICD-10-CM

## 2013-11-12 DIAGNOSIS — A530 Latent syphilis, unspecified as early or late: Secondary | ICD-10-CM

## 2013-11-12 DIAGNOSIS — A539 Syphilis, unspecified: Secondary | ICD-10-CM

## 2013-11-12 LAB — CBC
HEMATOCRIT: 35.4 % — AB (ref 39.0–52.0)
Hemoglobin: 11.9 g/dL — ABNORMAL LOW (ref 13.0–17.0)
MCH: 31.9 pg (ref 26.0–34.0)
MCHC: 33.6 g/dL (ref 30.0–36.0)
MCV: 94.9 fL (ref 78.0–100.0)
Platelets: 238 10*3/uL (ref 150–400)
RBC: 3.73 MIL/uL — ABNORMAL LOW (ref 4.22–5.81)
RDW: 13.9 % (ref 11.5–15.5)
WBC: 6 10*3/uL (ref 4.0–10.5)

## 2013-11-12 LAB — BASIC METABOLIC PANEL
BUN: 7 mg/dL (ref 6–23)
CO2: 22 meq/L (ref 19–32)
CREATININE: 0.9 mg/dL (ref 0.50–1.35)
Calcium: 8.4 mg/dL (ref 8.4–10.5)
Chloride: 104 mEq/L (ref 96–112)
GFR calc Af Amer: >90 mL/min (ref >90)
GFR calc non Af Amer: >90 mL/min (ref >90)
Glucose, Bld: 84 mg/dL (ref 70–99)
Potassium: 3.7 mEq/L (ref 3.7–5.3)
SODIUM: 138 meq/L (ref 137–147)

## 2013-11-12 LAB — OVA AND PARASITE EXAMINATION

## 2013-11-12 LAB — FECAL LACTOFERRIN, QUANT: Fecal Lactoferrin: POSITIVE

## 2013-11-12 LAB — T.PALLIDUM AB, IGG: T pallidum Antibodies (TP-PA): >8 S/CO — ABNORMAL HIGH (ref <0.90)

## 2013-11-12 LAB — MAGNESIUM: MAGNESIUM: 1.7 mg/dL (ref 1.5–2.5)

## 2013-11-12 LAB — FECAL FAT, QUALITATIVE
Free fatty acids: NORMAL
Neutral Fat: NORMAL

## 2013-11-12 LAB — MRSA PCR SCREENING: MRSA by PCR: POSITIVE — AB

## 2013-11-12 LAB — RPR TITER

## 2013-11-12 LAB — RPR: RPR: REACTIVE — AB

## 2013-11-12 MED ORDER — DARUNAVIR ETHANOLATE 800 MG PO TABS: 800.0000 mg | ORAL_TABLET | Freq: Every day | ORAL | Status: DC

## 2013-11-12 MED ORDER — ENSURE COMPLETE PO LIQD: 237.0000 mL | Freq: Two times a day (BID) | ORAL | Status: DC

## 2013-11-12 MED ORDER — PENICILLIN G BENZATHINE 1200000 UNIT/2ML IM SUSP
2.4000 10*6.[IU] | INTRAMUSCULAR | Status: DC
  Administered 2013-11-12: 2.4 10*6.[IU] via INTRAMUSCULAR
  Filled 2013-11-12: qty 4

## 2013-11-12 MED ORDER — EMTRICITABINE-TENOFOVIR DF 200-300 MG PO TABS: 1.0000 | ORAL_TABLET | Freq: Every day | ORAL | Status: DC

## 2013-11-12 MED ORDER — METRONIDAZOLE 500 MG PO TABS: 500.0000 mg | ORAL_TABLET | Freq: Two times a day (BID) | ORAL | Status: DC

## 2013-11-12 MED ORDER — SULFAMETHOXAZOLE-TMP DS 800-160 MG PO TABS: 1.0000 | ORAL_TABLET | Freq: Every day | ORAL | Status: DC

## 2013-11-12 MED ORDER — RITONAVIR 100 MG PO CAPS: 100.0000 mg | ORAL_CAPSULE | Freq: Every day | ORAL | Status: DC

## 2013-11-12 MED ORDER — WHITE PETROLATUM GEL
Status: AC
  Administered 2013-11-12: 0.2
  Filled 2013-11-12: qty 5

## 2013-11-12 MED ORDER — METRONIDAZOLE 500 MG PO TABS
500.0000 mg | ORAL_TABLET | Freq: Two times a day (BID) | ORAL | Status: DC
  Administered 2013-11-12 (×2): 500 mg via ORAL
  Filled 2013-11-12 (×2): qty 1

## 2013-11-12 NOTE — Clinical Social Work Note (Signed)
CSW contacted pt's HR representative, Helaine ChessDonna Hill [phone]), regarding pt's FMLA paperwork. Lupita Leashonna sent CSW pt's FMLA paperwork and informed CSW to instruct pt to return FMLA completed paperwork to "plant" as soon as possible. CSW provided pt with FMLA paperwork at bedside and requested that the RN assist pt in getting signature from MD. CSW signing off. Please re consult if CSW needed.  Darlyn ChamberEmily [place], LCSWA Clinical Social Worker [phone]

## 2013-11-12 NOTE — Progress Notes (Signed)
Discharge home. Home discharge instruction given, no questions verbalized. 

## 2013-11-12 NOTE — Discharge Summary (Signed)
Name: Gabriel [name] MRN: [id number] DOB: [birth year] 38 y.o. 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  Start taking on:  11/13/2013     emtricitabine-tenofovir 200-300 MG per tablet  Commonly known as:  TRUVADA  Take 1 tablet by mouth at bedtime.  Start taking on:  11/13/2013     metroNIDAZOLE 500 MG tablet  Commonly known as:  FLAGYL  Take 1 tablet (500 mg total) by mouth every 12 (twelve) hours.  Start taking on:  11/13/2013     ritonavir 100 MG capsule  Commonly known as:  NORVIR  Take 1 capsule (100 mg total) by mouth at bedtime.  Start taking on:  11/13/2013     sulfamethoxazole-trimethoprim 800-160 MG per tablet  Commonly known as:  BACTRIM DS  Take 1 tablet by mouth daily.  Start taking on:  11/13/2013        Disposition and follow-up:   [name] was discharged from [facility] in Good condition.  At the hospital follow up visit please address:  1.   Resolution of diarrhea due to giardiasis - if additional metronidazole warranted       Work excusal duration  - if needs additional days off work             Compliance with HIV medications        Needs 2 more weekly PCN IM injections - already set up at ID clinic         Resolution of normocytic anemia        If GI referral warranted if symptoms persist despite antibiotic therapy   2.  Labs / imaging needed at time of follow-up:  CBC (H/H), consider anemia panel, FOBT  3.  Pending labs/ test needing follow-up: HIV integrase, blood culture AFB  Follow-up Appointments:     Follow-up Information   Follow up with Boykin Peek, MD On 11/15/2013. (10:15am)    Specialty:  Internal Medicine   Contact information:   [address] [place] [address] [phone]       Follow up with Johny Sax, MD On 11/19/2013. (4:15 PM)    Specialty:  Infectious Diseases   Contact information:   [address]  [address] [place] [address] [phone]       Follow up with [facility]              On 11/26/2013. (9:00AM)    Contact information:   [address] [place] [address]       Follow up with Doris Cheadle, MD On 12/28/2013. (10:30am)    Specialty:  Internal Medicine   Contact information:   [address] [place] [address] [phone]       Discharge Instructions: Discharge Orders   Future Appointments Provider Department Dept Phone   11/15/2013 10:15 AM Harvel Quale  Delane Ginger, MD [facility] [phone]   11/19/2013 4:15 PM Ginnie Smart, MD [facility] [phone]   11/26/2013 9:00 AM [facility]Rc[facility] Nurse [id number] [facility] Disease [phone]   12/23/2013 10:30 AM Doris Cheadle, MD [facility] [phone]   Future Orders Complete By Expires   Increase activity slowly  As directed       Consultations:  Infectious Disease (Dr. Drue Second), General Surgery (Dr. Magnus Ivan)  Procedures Performed:  Dg Chest 1 View  11/10/2013   CLINICAL DATA:  Possible pulmonary nodule  EXAM: CHEST - 1 VIEW  COMPARISON:  Study performed earlier today  FINDINGS: Lungs are clear. Heart size normal. Nipple markers indicate that the nodular density seen on the prior study represented a nipple shadow.  IMPRESSION: No pulmonary nodules.    Electronically Signed   By: Esperanza Heir M.D.   On: 11/10/2013 13:01   Dg Chest 2 View  11/10/2013   CLINICAL DATA:  Cough for 3 weeks with diarrhea  EXAM: CHEST  2 VIEW  COMPARISON:  12/16/2009  FINDINGS: Hyperinflation suggests COPD. Heart size and vascular pattern are normal. 1 cm nodular opacity over the right mid lung zone. On the left side, there are no abnormal opacities.  IMPRESSION: No evidence of pneumonia.  COPD.  Recommend repeating the PA view with nipple markers to confirm that the nodular density represents a nipple shadow.   Electronically Signed   By: Esperanza Heir M.D.   On: 11/10/2013 10:46   Ct Abdomen Pelvis W Contrast  11/10/2013   CLINICAL DATA:  Three-week history of crampy abdominal pain and diarrhea, history of HIV and MR as a  EXAM: CT ABDOMEN AND PELVIS WITH CONTRAST  TECHNIQUE: Multidetector CT imaging of the abdomen and pelvis was performed using the standard protocol following bolus administration of intravenous contrast.  CONTRAST:  OMNIPAQUE IOHEXOL 300 MG/ML SOLN, the patient did receive oral contrast material.  COMPARISON:  CT scan of the abdomen and pelvis dated December 17, 2009.  FINDINGS: The liver exhibits no focal mass nor ductal dilation. The gallbladder is adequately distended with no evidence of stones or surrounding inflammatory changes. The pancreas, spleen, nondistended stomach, adrenal glands, and kidneys exhibit no acute abnormality. The caliber of the abdominal aorta is normal. There is no periaortic nor pericaval lymphadenopathy.  The orally administered contrast has traversed the small bowel and reached as far as the splenic flexure of the colon. There is mild distention of small-bowel loops in the mid and upper abdomen. In the left mid abdomen on images 31 through 41 there is a configuration of unopacified small bowel loops that may reflect intussusception. No edematous bowel loops are demonstrated.  Contrast is present within the colon as far  as the splenic flexure and it appears normal. A normal calibered, contrast-filled appendix is demonstrated. There is no evidence of colitis or diverticulitis. The nondistended portion of the colon exhibits no suspicious findings. There is a moderate amount gas and some stool in the rectum.  The partially distended urinary bladder is normal in appearance. The prostate gland produces a mild impression upon the urinary bladder base. There is no evidence of ascites. There is no inguinal nor umbilical hernia. The lung bases are clear. The lumbar vertebral bodies are preserved in height. The lung bases are clear.  IMPRESSION: 1. The appearance of the proximal small bowel may reflect mild obstruction or ileus secondary to what appears to  be an area of intussusception or possible internal hernia in the left mid abdomen. There is no complete obstruction since contrast has proceeded is far distally as the splenic flexure of the colon. 2. There is no evidence of inflammation of the colon. There is no acute appendicitis or evidence of diverticulitis. 3. There is no evidence of acute hepatobiliary or acute urinary tract abnormality. 4. There is no evidence of ascites. No intra-abdominal or pelvic lymphadenopathy is demonstrated.   Electronically Signed   By: David  [place]   On: 11/10/2013 13:00   Dg Abd Portable 1v  11/11/2013   CLINICAL DATA:  Rule out obstruction.  Chronic diarrhea.  HIV.  EXAM: PORTABLE ABDOMEN - 1 VIEW  COMPARISON:  CT 11/10/2013  FINDINGS: Gas in nondilated large and small bowel loops. Negative for bowel obstruction. Negative for bowel thickening or pneumatosis. No mass or soft tissue calcifications.  IMPRESSION: Negative   Electronically Signed   By: Marlan Palau M.D.   On: 11/11/2013 09:26    2D Echo: none  Cardiac Cath: none  Admission HPI: Original Author Otis Brace, MD  Gabriel [name] is a 38 year old man with past medical history of HIV infection (last CD4 300 viral load 50 copies  05/2011) with noncompliance with medications/follow-up care who presents with diarrhea of 3 week duration. Pt reports that for the past 3 weeks he has been having diarrhea consisting of frequent watery stools (3-4/hr), at times foamy that is unrelieved with fasting or worsened with food. Pt reports diarrhea is preceded by lower abdominal cramping (periumbilical), stomach growling, and gas. Pt reports his appetite is good and is able to tolerate PO intake with no nausea or vomiting. He denies fever but reports chills, fatigue, lightheadedness with standing, mild headache, and thick productive cough   He denies recent sick contacts, travel, change in diet, or medications. He has no history of prior abdominal surgeries and denies similar diarrhea in the past. He reports not following up with ID clinic since 2012 and was to have an appointment this coming week. His last CD4 count was 300 with viral load 50 copies on 05/2011. Pt reports history of noncompliance with HIV medications which include prezista, truvuda, norvir, and bactrim DS. He reports not taking medications at one point for 6 months. He states that he was not taking his medications last month and just starting taking them 1 week ago. He reports having been treated for syphilis but is unsure.   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 Pt was afebile without leukocytosis, however there was concern for opportunistic infection as he was intermittently compliant with HIV medications and had poor medical follow-up. Diarrhea was not responsive to fasting. Pt was on placed on contact enteric precautions. Workup included c. diff (-), GI pathogen panel, stool culture (-), O & P (entamoemba coli, blastocystis hominis, giardia lamblia),  fecal lactoferrin (+), fecal fat (wnl). Pt was found to be  positive for giardiasis and received initially empiric IV metronidazole (two days) and then transitioned to PO metronidazole 500 mg BID which he was instructed to take for 7 days for treatment of giardiasis. Pt also found to be positive for entamoemba coli and blastocystis hominis considered to be non-pathogenic GI organisms. Pt received adequate hydration during hospitalization with close monitoring of electrolytes. Pt's diarrhea did not subside but frequency did improve  after antibiotic treatment. Pt to follow-up with ID clinic for further management and if longer course of antibiotic warranted.   HIV Infection - Pt with HIV infection (last CD4 300 viral load 50 copies 05/2011) with no follow-up care since 2012 who was non-compliant with medications (recently started 1 week ago). Pt found to have CD4 count 340 and viral load 67300. Pt was continued on home prezista 800 mg, truvada 200-300 mg, ritonavir 100 mg, and bactrim DS 800-160 mg daily. Pt reported he will be able to obtain the medications from ID clinic. Pt with HIV integrase genotype pending at time of discharge.   Latent Syphilis Infection - Pt was [facility] of [facility]. Pt with last RPR titer 1:16  on 06/21/11. Pt with reactive titer (1:128) and >8 T pallidum IgG. Pt received IM 2.21M PCN X  1 dose and to f/u with ID for 2nd and 3rd weekly injections.   Possible Proximal Partial Small Bowel Obstruction vs Mild Ileus- Pt presented with abdominal cramping preceding diarrhea with abdominal tenderness and voluntary guarding with CT abdomen w/o cont findings of possible proximal small bowel mild obstruction or ileus secondary to possible intussusception or possible internal hernia in the left mid abdomen. Pt was initially kept NPO for bowel rest with advancement of diet as tolerated. Pt had NG tube placed as tolerated with resolution of symptoms on second day of admission with abdominal xray without evidence of obstruction.  General surgery was consulted who did not recommend surgical intervention. They suggested that if symptoms continue to persist, outpatient GI referral for either a small bowel follow through or capsule endoscopy.   Hypokalemia - Pt with K of 3.6 during hospitalizaiton most likely due to GI losses. Magnesium was within normal limits. Pt was repleted with potassium chloride as needed with normalization of values.    Normocytic Anemia - Pt with no active bleeding or hemodynamic instability during hospitalization. Pt with Hg of 11.9 on discharge with baseline Hg of 14. Etiology of anemia unknown. Pt to have repeat CBC at hospital follow-up visit to ensure resolution.      Moderate Malnutrition - Pt with BMI approx 20. Pt reported recent 20lb weight loss in last month due to persistent diarrhea. Albumin levels were within normal limits. Pt was seen by registered dietician during hospitalization and continued on adequate diet and nutrition supplements as needed.   Hyperlipidemia - Pt with last lipid panel on 06/21/11 with hypertriglyceridemia (231) with repeat lipid panel on 1/19  within normal limits. HbA1c was 5.4 indicating no diabetes mellitus.   Tobacco Abuse - Pt with 1 pack/day history for 19 years. Pt was continued on nicotine patch 21 mg during hospitalization with no reported nicotine withdrawal symptoms.   DVT Prophylaxis - Pt refused enoxaparin daily injections during hospitalization with no evidence of DVT during hospitalizaiton.    Discharge Vitals:   BP 99/56  Pulse 77  Temp(Src) 98.3 F (36.8 C) (Oral)  Resp 20  Ht 6\' 6"  (1.981 m)  Wt 76.6 kg (168 lb 14 oz)  BMI 19.52 kg/m2  SpO2 98%  Discharge Labs:  Results for orders placed during the hospital encounter of 11/10/13 (from the past 24 hour(s))  RPR     Status: Abnormal   Collection Time    11/12/13  4:04 AM      Result Value Range   RPR Reactive (*) NON REACTIVE  BASIC METABOLIC PANEL     Status: None   Collection Time  11/12/13  4:04 AM      Result Value Range   Sodium 138  137 - 147 mEq/L   Potassium 3.7  3.7 - 5.3 mEq/L   Chloride 104  96 - 112 mEq/L   CO2 22  19 - 32 mEq/L   Glucose, Bld 84  70 - 99 mg/dL   BUN 7  6 - 23 mg/dL   Creatinine, Ser 1.61  0.50 - 1.35 mg/dL   Calcium 8.4  8.4 - 09.6 mg/dL   GFR calc non Af Amer >90  >90 mL/min   GFR calc Af Amer >90  >90 mL/min  MAGNESIUM     Status: None   Collection Time    11/12/13  4:04 AM      Result Value Range   Magnesium 1.7  1.5 - 2.5 mg/dL  CBC     Status: Abnormal   Collection Time    11/12/13  4:04 AM      Result Value Range   WBC 6.0  4.0 - 10.5 K/uL   RBC 3.73 (*) 4.22 - 5.81 MIL/uL   Hemoglobin 11.9 (*) 13.0 - 17.0 g/dL   HCT 04.5 (*) 40.9 - 81.1 %   MCV 94.9  78.0 - 100.0 fL   MCH 31.9  26.0 - 34.0 pg   MCHC 33.6  30.0 - 36.0 g/dL   RDW 91.4  78.2 - 95.6 %   Platelets 238  150 - 400 K/uL  RPR TITER     Status: Abnormal   Collection Time    11/12/13  4:04 AM      Result Value Range   RPR Titer 1:128 (*) NON REACTIVE  T.PALLIDUM AB, IGG     Status: Abnormal   Collection Time    11/12/13  4:04 AM      Result Value Range   T pallidum Antibodies (TP-PA) >8.00 (*) <0.90 S/CO  MRSA PCR SCREENING     Status: Abnormal   Collection Time    11/12/13  4:06 AM      Result Value Range   MRSA by PCR POSITIVE (*) NEGATIVE    Signed: Otis Brace, MD 11/12/2013, 7:45 PM   Time Spent on Discharge: 90 minutes Services Ordered on Discharge: none Equipment Ordered on Discharge: none

## 2013-11-12 NOTE — Progress Notes (Addendum)
[facility]    Date of Admission:  11/10/2013   Total days of antibiotics 2        Day 2 metronidazole           ID: Gabriel [name] is a 38 y.o. male with hiv, cd 4 count of 340/VL 67,300 off of ART, presents with diarrhea    Principal Problem:   Diarrhea Active Problems:   HIV DISEASE   TOBACCO USER   Ileus   Malnutrition of moderate degree    Subjective: Feeling better that he can eat  Medications:  . Darunavir Ethanolate  800 mg Oral QHS  . emtricitabine-tenofovir  1 tablet Oral QHS  . enoxaparin (LOVENOX) injection  40 mg Subcutaneous Q24H  . metronidazole  500 mg Intravenous Q8H  . nicotine  21 mg Transdermal Daily  . ritonavir  100 mg Oral QHS  . sodium chloride  1,000 mL Intravenous Once  . sulfamethoxazole-trimethoprim  1 tablet Oral Daily    Objective: Vital signs in last 24 hours: Temp:  [97.9 F (36.6 C)-98.7 F (37.1 C)] 98.3 F (36.8 C) (01/20 0453) Pulse Rate:  [77-88] 77 (01/20 0453) Resp:  [20] 20 (01/20 0453) BP: (99-104)/(51-63) 99/56 mmHg (01/20 0453) SpO2:  [97 %-100 %] 98 % (01/20 0453) Physical Exam  Constitutional: He is oriented to person, place, and time. He appears well-developed and well-nourished. No distress.  HENT:  Mouth/Throat: Oropharynx is clear and moist. No oropharyngeal exudate.  Cardiovascular: Normal rate, regular rhythm and normal heart sounds. Exam reveals no gallop and no friction rub.  No murmur heard.  Pulmonary/Chest: Effort normal and breath sounds normal. No respiratory distress. He has no wheezes.  Abdominal: Soft. Bowel sounds are normal. He exhibits no distension. There is no tenderness.  Lymphadenopathy:  He has no cervical adenopathy.  Neurological: He is alert and oriented to person, place, and time.  Skin: Skin is warm and dry. No rash noted. No erythema.  Psychiatric: He has a normal mood and affect. His behavior is normal.    Lab Results  Recent Labs  11/11/13 0541  11/12/13 0404  WBC 8.2 6.0  HGB 12.5* 11.9*  HCT 37.7* 35.4*  NA 141 138  K 3.6* 3.7  CL 105 104  CO2 20 22  BUN 12 7  CREATININE 0.80 0.90   Liver Panel  Recent Labs  11/10/13 0940  PROT 8.0  ALBUMIN 3.5  AST 16  ALT 13  ALKPHOS 86  BILITOT 0.5   RPR 1:128  Microbiology: Stool multiplex + giardia Studies/Results: Dg Abd Portable 1v  11/11/2013   CLINICAL DATA:  Rule out obstruction.  Chronic diarrhea.  HIV.  EXAM: PORTABLE ABDOMEN - 1 VIEW  COMPARISON:  CT 11/10/2013  FINDINGS: Gas in nondilated large and small bowel loops. Negative for bowel obstruction. Negative for bowel thickening or pneumatosis. No mass or soft tissue calcifications.  IMPRESSION: Negative   Electronically Signed   By: Marlan Palau M.D.   On: 11/11/2013 09:26     Assessment/Plan: hiv disease, poorly controlled, taking outdated hiv meds= will need refills for new prescription for truvada/darunavir/ritonavir. Will need copay coupon cards to help reduce out of pocket cost. Instructed patient to come to clinic after discharge to get his copay cards. Will check hiv genotype  Giardia = likely cause of his diarrhea, please change to orals with metronidazole 500mg  BID x 7 days  Latent syphilis = he will need to be retreated with 3 weekly doses  of penicillin 2.4M IM injection. Please administer first dose today  Follow up= will need to have him be seen in ID clinic in 7 days for next injection or he needs to go to health dept in [address] for next 2 injections.  Patient can be reached at [phone] cell or home [phone]. Patient reports difficulty with transportation. Provided him with dr. Jarrett AblesFitzgerald's office at [facility] in [place] as another option for care  [facility], [facility] Cell: [phone] Pager: [phone]  11/12/2013, 1:07 PM

## 2013-11-12 NOTE — Progress Notes (Signed)
Internal Medicine Attending  Date: 11/12/2013  Patient name: Gabriel [name] Medical record number: [id number] Date of birth: 05/15/1976 Age: 38 y.o. Gender: male  I saw and evaluated the patient, and discussed his care on A.M rounds with housestaff.  I reviewed the resident's note by Dr. Johna Rolesabbani and I agree with the resident's findings and plans as documented in her note.

## 2013-11-12 NOTE — Progress Notes (Signed)
Subjective:  Pt seen and examined in AM.  No acute events overnight. Pt reports yesterday with improved diarrhea however today still with x2-3 loose stools. He was able to tolerate clear liquid diet with no abdominal pain, nausea, or vomiting. He denies fever, chills, lightheadedness, or change in urination.    Objective: Vital signs in last 24 hours: Filed Vitals:   11/11/13 1051 11/11/13 1429 11/11/13 2242 11/12/13 0453  BP: 97/56 104/63 100/51 99/56  Pulse: 88 88 78 77  Temp: 98.4 F (36.9 C) 97.9 F (36.6 C) 98.7 F (37.1 C) 98.3 F (36.8 C)  TempSrc: Oral Axillary Oral Oral  Resp: 18 20 20 20   Height:      Weight:      SpO2: 98% 100% 97% 98%   Weight change:  No intake or output data in the 24 hours ending 11/12/13 1321 Constitutional: He is oriented to person, place, and time. No distress.  Thin appearing  HENT:  Head: Normocephalic and atraumatic.  Right Ear: External ear normal.  Nose: Nose normal.  Mouth/Throat: Oropharynx is clear and moist. No oropharyngeal exudate.  No oral thrush  Eyes: Conjunctivae and EOM are normal. Neck: Normal range of motion. Neck supple.  Cardiovascular: Normal rate, regular rhythm and normal heart sounds.  Pulmonary/Chest: Effort normal and breath sounds normal. No respiratory distress. He has no wheezes. He has no rales. He exhibits no tenderness.  Abdominal: Bowel sounds are normal. There is no abdominal tenderness or guarding  Musculoskeletal: Normal range of motion. He exhibits no edema and no tenderness.  Neurological: He is alert and oriented to person, place, and time.  Skin: Skin is warm and dry. No rash noted. Marland Kitchen.  Psychiatric: He has a normal mood and affect. His behavior is normal. Thought content normal.    Lab Results: Basic Metabolic Panel:  Recent Labs Lab 11/11/13 0541 11/12/13 0404  NA 141 138  K 3.6* 3.7  CL 105 104  CO2 20 22  GLUCOSE 83 84  BUN 12 7  CREATININE 0.80 0.90  CALCIUM 8.2* 8.4  MG  1.8 1.7   Liver Function Tests:  Recent Labs Lab 11/10/13 0940  AST 16  ALT 13  ALKPHOS 86  BILITOT 0.5  PROT 8.0  ALBUMIN 3.5    Recent Labs Lab 11/10/13 0940  LIPASE 24   No results found for this basename: AMMONIA,  in the last 168 hours CBC:  Recent Labs Lab 11/10/13 0940 11/11/13 0541 11/12/13 0404  WBC 6.7 8.2 6.0  NEUTROABS 4.8  --   --   HGB 14.0 12.5* 11.9*  HCT 41.7 37.7* 35.4*  MCV 95.2 94.7 94.9  PLT 257 234 238   Cardiac Enzymes:  Recent Labs Lab 11/10/13 0940  TROPONINI <0.30   BNP: No results found for this basename: PROBNP,  in the last 168 hours D-Dimer: No results found for this basename: DDIMER,  in the last 168 hours CBG: No results found for this basename: GLUCAP,  in the last 168 hours Hemoglobin A1C:  Recent Labs Lab 11/11/13 0541  HGBA1C 5.4   Fasting Lipid Panel:  Recent Labs Lab 11/11/13 0541  CHOL 106  HDL 26*  LDLCALC 58  TRIG 191112  CHOLHDL 4.1   Thyroid Function Tests: No results found for this basename: TSH, T4TOTAL, FREET4, T3FREE, THYROIDAB,  in the last 168 hours Coagulation: No results found for this basename: LABPROT, INR,  in the last 168 hours Anemia Panel: No results found  for this basename: VITAMINB12, FOLATE, FERRITIN, TIBC, IRON, RETICCTPCT,  in the last 168 hours Urine Drug Screen: Drugs of Abuse     Component Value Date/Time   LABOPIA POSITIVE* 01/05/2009 1519   COCAINSCRNUR POSITIVE* 01/05/2009 1519   LABBENZ NONE DETECTED 01/05/2009 1519   AMPHETMU POSITIVE* 01/05/2009 1519   THCU NONE DETECTED 01/05/2009 1519   LABBARB  Value: NONE DETECTED        DRUG SCREEN FOR MEDICAL PURPOSES ONLY.  IF CONFIRMATION IS NEEDED FOR ANY PURPOSE, NOTIFY LAB WITHIN 5 DAYS.        LOWEST DETECTABLE LIMITS FOR URINE DRUG SCREEN Drug Class       Cutoff (ng/mL) Amphetamine      1000 Barbiturate      200 Benzodiazepine   200 Tricyclics       300 Opiates          300 Cocaine          300 THC              50 01/05/2009 1519     Alcohol Level: No results found for this basename: ETH,  in the last 168 hours Urinalysis:  Recent Labs Lab 11/10/13 1001  COLORURINE AMBER*  LABSPEC 1.034*  PHURINE 6.0  GLUCOSEU NEGATIVE  HGBUR NEGATIVE  BILIRUBINUR SMALL*  KETONESUR 15*  PROTEINUR NEGATIVE  UROBILINOGEN 1.0  NITRITE NEGATIVE  LEUKOCYTESUR SMALL*    Micro Results: Recent Results (from the past 240 hour(s))  STOOL CULTURE     Status: None   Collection Time    11/10/13 12:13 PM      Result Value Range Status   Specimen Description STOOL   Final   Special Requests NONE   Final   Culture     Final   Value: NO SUSPICIOUS COLONIES, CONTINUING TO HOLD     Note: REDUCED NORMAL FLORA PRESENT     Performed at [facility]   Report Status PENDING   Incomplete  CLOSTRIDIUM DIFFICILE BY PCR     Status: None   Collection Time    11/10/13 12:14 PM      Result Value Range Status   C difficile by pcr NEGATIVE  NEGATIVE Final  AFB CULTURE, BLOOD     Status: None   Collection Time    11/10/13  4:55 PM      Result Value Range Status   Specimen Description BLOOD LEFT HAND   Final   Special Requests 10CC   Final   Culture     Final   Value: CULTURE WILL BE EXAMINED FOR 6 WEEKS BEFORE ISSUING A FINAL REPORT     Performed at [facility]   Report Status PENDING   Incomplete  MRSA PCR SCREENING     Status: Abnormal   Collection Time    11/12/13  4:06 AM      Result Value Range Status   MRSA by PCR POSITIVE (*) NEGATIVE Final   Comment:            The GeneXpert MRSA Assay (FDA     approved for NASAL specimens     only), is one component of a     comprehensive MRSA colonization     surveillance program. It is not     intended to diagnose MRSA     infection nor to guide or     monitor treatment for     MRSA infections.     RESULT CALLED TO, READ BACK BY  AND VERIFIED WITH:     F.PLEASANT,RN 11/12/13 0653 BY BSLADE   Studies/Results: Dg Abd Portable 1v  11/11/2013   CLINICAL DATA:  Rule out  obstruction.  Chronic diarrhea.  HIV.  EXAM: PORTABLE ABDOMEN - 1 VIEW  COMPARISON:  CT 11/10/2013  FINDINGS: Gas in nondilated large and small bowel loops. Negative for bowel obstruction. Negative for bowel thickening or pneumatosis. No mass or soft tissue calcifications.  IMPRESSION: Negative   Electronically Signed   By: Marlan Palau M.D.   On: 11/11/2013 09:26   Medications: I have reviewed the patient's current medications. Scheduled Meds: . Darunavir Ethanolate  800 mg Oral QHS  . emtricitabine-tenofovir  1 tablet Oral QHS  . enoxaparin (LOVENOX) injection  40 mg Subcutaneous Q24H  . metroNIDAZOLE  500 mg Oral Q12H  . nicotine  21 mg Transdermal Daily  . penicillin g benzathine (BICILLIN-LA) IM  2.4 Million Units Intramuscular Weekly  . ritonavir  100 mg Oral QHS  . sodium chloride  1,000 mL Intravenous Once  . sulfamethoxazole-trimethoprim  1 tablet Oral Daily   Continuous Infusions:   PRN Meds:.acetaminophen, acetaminophen, ondansetron (ZOFRAN) IV, ondansetron  Assessment: 38 year old man with past medical history of HIV infection (last CD4 300 viral load 50 copies 05/2011) with noncompliance with medications/follow-up care who presents with diarrhea of 3 week duration and found to have possible short bowel obstruction on CT imaging.   Plan:    Persistent Diarrhea due to Giardiasis  - Pt with 3 week history of persistent diarrhea in setting of unmanaged HIV infection and recent change in sexual partners -Contact enteric precautions  -Obtain CD4 count --> 340, less concern for opportunistic infection  -Obtain GI pathogen panel --> + giardiasis  -Obtain stool culture  -Obtain O & P --> neg -Obtain fecal lactoferrin --> positive  -Obtain fecal fat --> normal -Per ID, PO metronidazole 500 mg BID x 7 days (received 2 days of IV)  -Sign FMLA paperwork for work excusal   Latent Syphilis Infection - Pt with reactive titer (1:128) and >8 T pallidum IgG   -IM 2.47M PCN  X 3 doses  weekly, 1st dose today  -To f/u with ID for 2nd and 3rd injections  HIV Infection - Pt with HIV infection (last CD4 300 viral load 50 copies 05/2011) with no follow-up care since 2012 who is non-compliant with medications (recently started 1 week ago).  -Obtain CD4 and viral load --> CD4 340, viral load 67300  -Continue home prezista 800mg  daily  -Continue truvada 200-300 mg daily  -Continue home ritonavir 100 mg daily  -Continue bactrim DS 800-160 mg daily  -Obtain HIV integrase genotype   Possible Proximal Partial Small Bowel Obstruction vs Mild Ileus- resolved following NG tube. Currently without abdominal pain or peritoneal signs. Abdominal xray today without evidence of obstruction.  -Advance to regular diet -Replace potassium as needed  -Monitor for peritoneal signs  -Appreciate surgery consult --> does not appear obstructed, if continues to persist recommend outpatient GI referral for upper gi or a capsule endoscopy.   Hypokalemia - resolved. Pt with K of 3.6 on 1/19 most likely due to GI losses. Magnesium was within normal limits. -Replace with potassium chloride as needed -Continue to monitor   Normocytic Anemia - stable with no active bleeding or hemodynamic instability.  Pt with Hg 12.5 with baseline 14. -Monitor for bleeding -Continue to monitor CBC  Malnutrition - Pt with BMI approx 20. Pt reports recent 20lb weight loss in last month. Albumin levels  within normal limits.  -Obtain nutrition consult  Hyperlipidemia - Pt with last lipid panel on 06/21/11 with hypertriglyceridemia (231) with repeat lipid panel within normal limits. HbA1c was 5.4  Tobacco Abuse - Pt with 1 pack/day history for 19 years.  -Continue nicotine patch 21 mg   Diet: clear liquids  DVT Ppx: Lovenox  Code: Full     Dispo: Disposition is deferred at this time, awaiting improvement of current medical problems.  Anticipated discharge in approximately 2-3 day(s).   The patient does not have a  current PCP (No Pcp Per Patient) and does need an [facility] hospital follow-up appointment after discharge.  The patient does not have transportation limitations that hinder transportation to clinic appointments.  .Services Needed at time of discharge: Y = Yes, Blank = No PT:   OT:   RN:   Equipment:   Other:     LOS: 2 days   Otis Brace, MD 11/12/2013, 1:21 PM

## 2013-11-12 NOTE — Discharge Instructions (Signed)
-  Take flagyl 500 mg twice a day for the next 7 days beginning 11/13/13 -Take all your prior medications as prescribed -Please attend your follow-up appointments -Pleasure meeting you, hope you were satisfied with our care

## 2013-11-12 NOTE — ED Provider Notes (Signed)
  This was a shared visit with a mid-level provided (NP or PA).  Throughout the patient's course I was available for consultation/collaboration.  I saw the ECG (if appropriate), relevant labs and studies - I agree with the interpretation.  On my exam the patient was in no distress.  With his persistent diarrhea, abd pain, HIV, he was admitted for further E/M.       Gabriel Munchobert Yenesis Even, MD 11/12/13 [id number]

## 2013-11-13 LAB — STOOL CULTURE

## 2013-11-15 ENCOUNTER — Encounter: Payer: Self-pay | Admitting: Internal Medicine

## 2013-11-15 ENCOUNTER — Ambulatory Visit: Payer: BC Managed Care – PPO | Admitting: Internal Medicine

## 2013-11-19 ENCOUNTER — Ambulatory Visit (INDEPENDENT_AMBULATORY_CARE_PROVIDER_SITE_OTHER): Payer: BC Managed Care – PPO | Admitting: Infectious Diseases

## 2013-11-19 ENCOUNTER — Encounter: Payer: Self-pay | Admitting: Infectious Diseases

## 2013-11-19 VITALS — BP 111/73 | HR 75 | Temp 97.6°F | Wt 179.0 lb

## 2013-11-19 DIAGNOSIS — F17200 Nicotine dependence, unspecified, uncomplicated: Secondary | ICD-10-CM

## 2013-11-19 DIAGNOSIS — A539 Syphilis, unspecified: Secondary | ICD-10-CM

## 2013-11-19 DIAGNOSIS — K029 Dental caries, unspecified: Secondary | ICD-10-CM

## 2013-11-19 DIAGNOSIS — Z23 Encounter for immunization: Secondary | ICD-10-CM

## 2013-11-19 DIAGNOSIS — B20 Human immunodeficiency virus [HIV] disease: Secondary | ICD-10-CM

## 2013-11-19 LAB — HIV-1 INTEGRASE GENOTYPE
Date Viral Load Collected: NO GROWTH
VALUE LAST VIRAL LOAD: NO GROWTH

## 2013-11-19 MED ORDER — PENICILLIN G BENZATHINE 1200000 UNIT/2ML IM SUSP
1.2000 10*6.[IU] | Freq: Once | INTRAMUSCULAR | Status: AC
  Administered 2013-11-19: 1.2 10*6.[IU] via INTRAMUSCULAR

## 2013-11-19 MED ORDER — NICOTINE 14 MG/24HR TD PT24: 14.0000 mg | MEDICATED_PATCH | Freq: Every day | TRANSDERMAL | Status: DC

## 2013-11-19 NOTE — Assessment & Plan Note (Addendum)
Encouraged to quit. Will write nicotine patches.

## 2013-11-19 NOTE — Progress Notes (Signed)
   Subjective:    Patient ID: Gabriel [name], male    DOB: 07/31/1976, 38 y.o.   MRN: [id number]  HPI 38 yo M admitted tot he hospital 11-10-13 to 11-12-13 with severe diarrhea. Was found to have giardia and entamoeba coli. Is still taking flagyl at this point. Also found to have syphillis. He was started on PEN. Off ART. CD4 in hospital was 340. ART will be available tomorrow.  Diarrhea has improved.   HIV 1 RNA Quant (copies/mL)  Date Value  11/10/2013 67300*  06/21/2011 50*  03/31/2011 39*     CD4 T Cell Abs (cmm)  Date Value  06/21/2011 300*  03/31/2011 360*  12/27/2010 300*      Review of Systems     Objective:   Physical Exam  Constitutional: He appears well-developed and well-nourished.  HENT:  Mouth/Throat: No oropharyngeal exudate.  Eyes: EOM are normal. Pupils are equal, round, and reactive to light.  Neck: Neck supple.  Cardiovascular: Normal rate, regular rhythm and normal heart sounds.   Pulmonary/Chest: Effort normal and breath sounds normal. No respiratory distress.  Abdominal: Soft. Bowel sounds are normal. He exhibits no distension. There is no tenderness.          Assessment & Plan:

## 2013-11-19 NOTE — Addendum Note (Signed)
Addended by: Lurlean LeydenPOOLE, Donivin Wirt F on: 11/19/2013 05:26 PM   Modules accepted: Orders

## 2013-11-19 NOTE — Assessment & Plan Note (Signed)
He gets PEN #2 today. rtc in 1 month.

## 2013-11-19 NOTE — Assessment & Plan Note (Signed)
Will restart his ART when new rx arrive in mail. He is offered, refuses condoms. Got flu at work, needs PNVX.

## 2013-11-19 NOTE — Assessment & Plan Note (Signed)
Will refer him to dental.  

## 2013-11-20 NOTE — Progress Notes (Deleted)
Patient ID: Gabriel KapurKevin Nading, male   DOB: 11/04/1975, 38 y.o.   MRN: [id number]   Health Maintenance reviewed - {health maintenance:315237}. No health maintenance topics applied. Health Maintenance Due  Topic  . Tetanus/tdap

## 2013-11-26 ENCOUNTER — Ambulatory Visit (INDEPENDENT_AMBULATORY_CARE_PROVIDER_SITE_OTHER): Payer: BC Managed Care – PPO | Admitting: *Deleted

## 2013-11-26 DIAGNOSIS — A539 Syphilis, unspecified: Secondary | ICD-10-CM

## 2013-11-26 LAB — FECAL FAT, QUANTITATIVE: Weight of Collection: 6 g

## 2013-11-26 MED ORDER — PENICILLIN G BENZATHINE 1200000 UNIT/2ML IM SUSP
1.2000 10*6.[IU] | Freq: Once | INTRAMUSCULAR | Status: AC
  Administered 2013-11-26: 1.2 10*6.[IU] via INTRAMUSCULAR

## 2013-11-26 NOTE — Progress Notes (Signed)
Subjective:   Patient ID: Gabriel [name] male    DOB: 10/16/1976 37 y.o.    MRN: [id number] Health Maintenance Due: Health Maintenance Due  Topic Date Due  . Tetanus/tdap  03/29/1995    _________________________________________________  HPI: Gabriel [name] is a 38 y.o. male here for an acute visit.  He had to leave for an appointment and therefore I did not see him at his appointment time.  He has a follow-up appt with Dr. Ninetta Lights in March.    PMH: Past Medical History  Diagnosis Date  . HIV positive   . MRSA infection   . Cellulitis     Medications: Current Outpatient Prescriptions on File Prior to Visit  Medication Sig Dispense Refill  . Darunavir Ethanolate (PREZISTA) 800 MG tablet Take 1 tablet (800 mg total) by mouth at bedtime.  30 tablet  11  . emtricitabine-tenofovir (TRUVADA) 200-300 MG per tablet Take 1 tablet by mouth at bedtime.  30 tablet  11  . metroNIDAZOLE (FLAGYL) 500 MG tablet Take 1 tablet (500 mg total) by mouth every 12 (twelve) hours.  14 tablet  0  . ritonavir (NORVIR) 100 MG capsule Take 1 capsule (100 mg total) by mouth at bedtime.  30 capsule  11   No current facility-administered medications on file prior to visit.    Allergies: No Known Allergies  FH: Family History  Problem Relation Age of Onset  . Cancer Maternal Grandmother   . Crohn's disease Brother     SH: History   Social History  . Marital Status: Single    Spouse Name: N/A    Number of Children: N/A  . Years of Education: N/A   Social History Main Topics  . Smoking status: Current Every Day Smoker -- 1.00 packs/day    Types: Cigarettes  . Smokeless tobacco: Never Used  . Alcohol Use: 1.5 oz/week    3 drink(s) per week  . Drug Use: No     Comment: hx of multiple substances in the past  . Sexual Activity: Yes    Partners: Male   Other Topics Concern  . None   Social History Narrative   internet processor    Review of Systems: Did not obtain.   Objective:     Most Recent Laboratory Results:  CMP     Component Value Date/Time   NA 138 11/12/2013 0404   K 3.7 11/12/2013 0404   CL 104 11/12/2013 0404   CO2 22 11/12/2013 0404   GLUCOSE 84 11/12/2013 0404   BUN 7 11/12/2013 0404   CREATININE 0.90 11/12/2013 0404   CREATININE 0.94 06/21/2011 1112   CALCIUM 8.4 11/12/2013 0404   PROT 8.0 11/10/2013 0940   ALBUMIN 3.5 11/10/2013 0940   AST 16 11/10/2013 0940   ALT 13 11/10/2013 0940   ALKPHOS 86 11/10/2013 0940   BILITOT 0.5 11/10/2013 0940   GFRNONAA >90 11/12/2013 0404   GFRAA >90 11/12/2013 0404    CBC    Component Value Date/Time   WBC 6.0 11/12/2013 0404   RBC 3.73* 11/12/2013 0404   HGB 11.9* 11/12/2013 0404   HCT 35.4* 11/12/2013 0404   PLT 238 11/12/2013 0404   MCV 94.9 11/12/2013 0404   MCH 31.9 11/12/2013 0404   MCHC 33.6 11/12/2013 0404   RDW 13.9 11/12/2013 0404   LYMPHSABS 1.2 11/10/2013 0940   MONOABS 0.6 11/10/2013 0940   EOSABS 0.1 11/10/2013 0940   BASOSABS 0.0 11/10/2013 0940    Lipid Panel Lab Results  Component  Value Date   CHOL 106 11/11/2013   HDL 26* 11/11/2013   LDLCALC 58 11/11/2013   TRIG 112 11/11/2013   CHOLHDL 4.1 11/11/2013    HA1C Lab Results  Component Value Date   HGBA1C 5.4 11/11/2013    Urinalysis    Component Value Date/Time   COLORURINE AMBER* 11/10/2013 1001   APPEARANCEUR CLOUDY* 11/10/2013 1001   LABSPEC 1.034* 11/10/2013 1001   PHURINE 6.0 11/10/2013 1001   GLUCOSEU NEGATIVE 11/10/2013 1001   HGBUR NEGATIVE 11/10/2013 1001   BILIRUBINUR SMALL* 11/10/2013 1001   KETONESUR 15* 11/10/2013 1001   PROTEINUR NEGATIVE 11/10/2013 1001   UROBILINOGEN 1.0 11/10/2013 1001   NITRITE NEGATIVE 11/10/2013 1001   LEUKOCYTESUR SMALL* 11/10/2013 1001    Urine Microalbumin No results found for this basename: MICROALBUR, [id number]    Imaging N/A   Assessment & Plan:   No assessment and plan as pt was unable to keep his appt today and left before I could see him.

## 2013-12-23 ENCOUNTER — Ambulatory Visit: Payer: BC Managed Care – PPO | Admitting: Internal Medicine

## 2013-12-23 LAB — AFB CULTURE, BLOOD

## 2013-12-30 ENCOUNTER — Telehealth: Payer: Self-pay | Admitting: *Deleted

## 2013-12-30 ENCOUNTER — Ambulatory Visit: Payer: BC Managed Care – PPO | Admitting: Infectious Diseases

## 2013-12-30 NOTE — Telephone Encounter (Signed)
Called patient and rescheduled appt for 01/17/14. Patient no showed today.

## 2014-01-17 ENCOUNTER — Ambulatory Visit: Payer: BC Managed Care – PPO | Admitting: Infectious Diseases

## 2014-01-22 ENCOUNTER — Other Ambulatory Visit: Payer: Self-pay | Admitting: Licensed Clinical Social Worker

## 2014-01-22 DIAGNOSIS — B20 Human immunodeficiency virus [HIV] disease: Secondary | ICD-10-CM

## 2014-01-23 ENCOUNTER — Other Ambulatory Visit: Payer: BC Managed Care – PPO

## 2014-04-16 ENCOUNTER — Telehealth: Payer: Self-pay | Admitting: *Deleted

## 2014-04-16 NOTE — Telephone Encounter (Signed)
Pt is detectable, needs appointment.  Patient has lost job, Community education officerinsurance.  He will connect with Raynelle FanningJulie at [facility] for [employer]ADAP application.  [facility] Given appointment with Dr. Orvan Falconerampbell.  If he cannot meet with Raynelle FanningJulie, he will see Con MemosMichelle Evans here first that day. Andree CossHowell, Michelle M, RN

## 2014-04-24 ENCOUNTER — Encounter: Payer: Self-pay | Admitting: Internal Medicine

## 2014-04-24 ENCOUNTER — Ambulatory Visit (INDEPENDENT_AMBULATORY_CARE_PROVIDER_SITE_OTHER): Payer: Self-pay | Admitting: Internal Medicine

## 2014-04-24 ENCOUNTER — Other Ambulatory Visit: Payer: Self-pay | Admitting: Internal Medicine

## 2014-04-24 VITALS — BP 117/75 | HR 77 | Temp 98.1°F | Ht 78.0 in | Wt 185.2 lb

## 2014-04-24 DIAGNOSIS — Z113 Encounter for screening for infections with a predominantly sexual mode of transmission: Secondary | ICD-10-CM

## 2014-04-24 DIAGNOSIS — Z79899 Other long term (current) drug therapy: Secondary | ICD-10-CM

## 2014-04-24 DIAGNOSIS — K029 Dental caries, unspecified: Secondary | ICD-10-CM

## 2014-04-24 DIAGNOSIS — B20 Human immunodeficiency virus [HIV] disease: Secondary | ICD-10-CM

## 2014-04-24 LAB — LIPID PANEL
CHOLESTEROL: 115 mg/dL (ref 0–200)
HDL: 32 mg/dL — ABNORMAL LOW (ref >39)
LDL Cholesterol: 30 mg/dL (ref 0–99)
Total CHOL/HDL Ratio: 3.6 Ratio
Triglycerides: 263 mg/dL — ABNORMAL HIGH (ref <150)
VLDL: 53 mg/dL — AB (ref 0–40)

## 2014-04-24 LAB — COMPREHENSIVE METABOLIC PANEL
ALBUMIN: 3.8 g/dL (ref 3.5–5.2)
ALT: 12 U/L (ref 0–53)
AST: 15 U/L (ref 0–37)
Alkaline Phosphatase: 65 U/L (ref 39–117)
BUN: 16 mg/dL (ref 6–23)
CO2: 29 meq/L (ref 19–32)
Calcium: 8.4 mg/dL (ref 8.4–10.5)
Chloride: 105 mEq/L (ref 96–112)
Creat: 0.87 mg/dL (ref 0.50–1.35)
Glucose, Bld: 82 mg/dL (ref 70–99)
POTASSIUM: 3.6 meq/L (ref 3.5–5.3)
Sodium: 141 mEq/L (ref 135–145)
TOTAL PROTEIN: 6.7 g/dL (ref 6.0–8.3)
Total Bilirubin: 0.5 mg/dL (ref 0.2–1.2)

## 2014-04-24 LAB — CBC
HCT: 35.6 % — ABNORMAL LOW (ref 39.0–52.0)
HEMOGLOBIN: 12.2 g/dL — AB (ref 13.0–17.0)
MCH: 32.4 pg (ref 26.0–34.0)
MCHC: 34.3 g/dL (ref 30.0–36.0)
MCV: 94.4 fL (ref 78.0–100.0)
Platelets: 178 10*3/uL (ref 150–400)
RBC: 3.77 MIL/uL — AB (ref 4.22–5.81)
RDW: 13.8 % (ref 11.5–15.5)
WBC: 3.3 10*3/uL — ABNORMAL LOW (ref 4.0–10.5)

## 2014-04-24 MED ORDER — AMOXICILLIN 500 MG PO CAPS: 500.0000 mg | ORAL_CAPSULE | Freq: Three times a day (TID) | ORAL | Status: DC

## 2014-04-24 NOTE — Progress Notes (Signed)
Patient ID: Gabriel [name], male   DOB: 10/12/1976, 38 y.o.   MRN: [id number]          Patient Active Problem List   Diagnosis Date Noted  . HIV DISEASE 11/28/2006    Priority: High  . Dyslipidemia 04/14/2011  . Blood in stool 03/20/2009  . 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 DENTAL CARIES 01/02/2009  . TOBACCO USER 05/27/2008  . INSOMNIA, CHRONIC 05/27/2008  . SYPHILIS NOS 11/28/2006    Patient's Medications  New Prescriptions   No medications on file  Previous Medications   DARUNAVIR ETHANOLATE (PREZISTA) 800 MG TABLET    Take 1 tablet (800 mg total) by mouth at bedtime.   EMTRICITABINE-TENOFOVIR (TRUVADA) 200-300 MG PER TABLET    Take 1 tablet by mouth at bedtime.   NICOTINE ([employer] NICOTINE TRANSDERMAL SYS) 14 MG/24HR PATCH    Place 1 patch (14 mg total) onto the skin daily.   RITONAVIR (NORVIR) 100 MG CAPSULE    Take 1 capsule (100 mg total) by mouth at bedtime.  Modified Medications   No medications on file  Discontinued Medications   METRONIDAZOLE (FLAGYL) 500 MG TABLET    Take 1 tablet (500 mg total) by mouth every 12 (twelve) hours.    Subjective: Gabriel [name] is a for a routine followup visit. He states that he has been doing well since he was hospitalized earlier this year with diarrhea. He did restart his antiretroviral regimen of Truvada, Prezista and Norvir and does not think he has been missing doses frequently. He lost her job recently and has just filled his last prescription with his health insurance. However, because of periods of poor adherence in the past he had several months of his current medications stock piled at home. [place] and hopes to start soon.  He has a desire to quit smoking cigarettes. He has been using the cigarettes but has not been able to quit. He states that he knows he should call the [employer]" one and see if he can get more nicotine patches.  He states that he is not currently sexually  active in the past 6 months. He states that he has quit clubbing and has no desire for sex since he is no longer doing drugs. He is currently writing a book about HIV, gay culture and drugs. Review of Systems: Constitutional: positive for he has not lost weight recently but he states that he is all the time and cannot gain weight, negative for anorexia, chills, fevers, sweats and weight loss Eyes: negative Ears, nose, mouth, throat, and face: negative Respiratory: negative Cardiovascular: negative Gastrointestinal: negative Genitourinary:negative  Past Medical History  Diagnosis Date  . HIV positive   . MRSA infection   . Cellulitis     History  Substance Use Topics  . Smoking status: Current Every Day Smoker -- 1.00 packs/day    Types: Cigarettes  . Smokeless tobacco: Never Used  . Alcohol Use: 1.5 oz/week    3 drink(s) per week    Family History  Problem Relation Age of Onset  . Cancer Maternal Grandmother   . Crohn's disease Brother   . Hypertension Mother     No Known Allergies  Objective: Temp: 98.1 F (36.7 C) (07/02 1050) Temp src: Oral (07/02 1050) BP: 117/75 mmHg (07/02 1050) Pulse Rate: 77 (07/02 1050) Body mass index is 21.41 kg/(m^2).  General: He is talkative and in good spirits Oral: No oropharyngeal lesions Skin: No  rash Lungs: Clear Cor: Regular S1 and S2 no murmurs Abdomen: Soft and nontender Joints and extremities: Normal Neuro: Alert with normal speech and conversation Mood and affect: Bright and appropriate  Lab Results Lab Results  Component Value Date   WBC 6.0 11/12/2013   HGB 11.9* 11/12/2013   HCT 35.4* 11/12/2013   MCV 94.9 11/12/2013   PLT 238 11/12/2013    Lab Results  Component Value Date   CREATININE 0.90 11/12/2013   BUN 7 11/12/2013   NA 138 11/12/2013   K 3.7 11/12/2013   CL 104 11/12/2013   CO2 22 11/12/2013    Lab Results  Component Value Date   ALT 13 11/10/2013   AST 16 11/10/2013   ALKPHOS 86 11/10/2013   BILITOT 0.5  11/10/2013    Lab Results  Component Value Date   CHOL 106 11/11/2013   HDL 26* 11/11/2013   LDLCALC 58 11/11/2013   TRIG 112 11/11/2013   CHOLHDL 4.1 11/11/2013    Lab Results HIV 1 RNA Quant (copies/mL)  Date Value  11/10/2013 67300*  06/21/2011 50*  03/31/2011 39*     CD4 T Cell Abs (cmm)  Date Value  06/21/2011 300*  03/31/2011 360*  12/27/2010 300*     Assessment: It sounds like his adherence has improved over the past 6 months. I will continue his current regimen and recheck lab work today. I talked to him about the importance of consistent adherence with his regimen. I asked him to set his cell phone alarm to remind him to take his medication each evening. He prefers to take his medicine in the evening, finding that it can cause some somnolence if he takes it in the morning.  He was recently treated for syphilis. I will recheck an RPR today. He was given condoms.  I asked him to call to [facility] quit line and try to develop a plan to quit smoking cigarettes.  Plan: 1. Continue current antiretroviral regimen 2. Check lab work today 3. Cigarette cessation counseling provided 4. Apply for ADAP 5. Followup in one week   Cliffton AstersJohn Denham Mose, MD [facility] [phone] pager   [phone] cell 04/24/2014, 11:25 AM

## 2014-04-24 NOTE — Addendum Note (Signed)
Addended by: Jennet MaduroESTRIDGE, DENISE D on: 04/24/2014 12:15 PM   Modules accepted: Orders

## 2014-04-25 LAB — RPR: RPR: REACTIVE — AB

## 2014-04-25 LAB — RPR TITER

## 2014-04-28 LAB — HIV-1 RNA QUANT-NO REFLEX-BLD
HIV 1 RNA QUANT: 44783 {copies}/mL — AB (ref <20)
HIV-1 RNA QUANT, LOG: 4.65 {Log} — AB (ref <1.30)

## 2014-04-28 LAB — T.PALLIDUM AB, TOTAL

## 2014-04-28 LAB — T-HELPER CELL (CD4) - (RCID CLINIC ONLY)
CD4 T CELL HELPER: 19 % — AB (ref 33–55)
CD4 T Cell Abs: 200 /uL — ABNORMAL LOW (ref 400–2700)

## 2014-04-30 ENCOUNTER — Other Ambulatory Visit: Payer: Self-pay | Admitting: *Deleted

## 2014-04-30 DIAGNOSIS — B20 Human immunodeficiency virus [HIV] disease: Secondary | ICD-10-CM

## 2014-04-30 MED ORDER — EMTRICITABINE-TENOFOVIR DF 200-300 MG PO TABS: 1.0000 | ORAL_TABLET | Freq: Every day | ORAL | Status: DC

## 2014-04-30 MED ORDER — RITONAVIR 100 MG PO CAPS: 100.0000 mg | ORAL_CAPSULE | Freq: Every day | ORAL | Status: DC

## 2014-04-30 MED ORDER — DARUNAVIR ETHANOLATE 800 MG PO TABS: 800.0000 mg | ORAL_TABLET | Freq: Every day | ORAL | Status: DC

## 2014-04-30 NOTE — Telephone Encounter (Signed)
ADAP application 

## 2014-05-02 ENCOUNTER — Encounter: Payer: Self-pay | Admitting: Internal Medicine

## 2014-05-02 ENCOUNTER — Ambulatory Visit (INDEPENDENT_AMBULATORY_CARE_PROVIDER_SITE_OTHER): Payer: Self-pay | Admitting: Internal Medicine

## 2014-05-02 ENCOUNTER — Ambulatory Visit: Payer: BC Managed Care – PPO | Admitting: Internal Medicine

## 2014-05-02 VITALS — BP 110/72 | HR 84 | Temp 98.2°F | Wt 188.0 lb

## 2014-05-02 DIAGNOSIS — B20 Human immunodeficiency virus [HIV] disease: Secondary | ICD-10-CM

## 2014-05-02 NOTE — Progress Notes (Signed)
Patient ID: Gabriel [name], male   DOB: [birth year], 38 y.o.   MRN: [id number]          Patient Active Problem List   Diagnosis Date Noted  . HIV DISEASE 11/28/2006    Priority: High  . Dyslipidemia 04/14/2011  . Blood in stool 03/20/2009  . 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 DENTAL CARIES 01/02/2009  . TOBACCO USER 05/27/2008  . INSOMNIA, CHRONIC 05/27/2008  . SYPHILIS NOS 11/28/2006    Patient's Medications  New Prescriptions   No medications on file  Previous Medications   AMOXICILLIN (AMOXIL) 500 MG CAPSULE    Take 1 capsule (500 mg total) by mouth 3 (three) times daily.   DARUNAVIR ETHANOLATE (PREZISTA) 800 MG TABLET    Take 1 tablet (800 mg total) by mouth at bedtime.   EMTRICITABINE-TENOFOVIR (TRUVADA) 200-300 MG PER TABLET    Take 1 tablet by mouth at bedtime.   NICOTINE ([facility] NICOTINE TRANSDERMAL SYS) 14 MG/24HR PATCH    Place 1 patch (14 mg total) onto the skin daily.   RITONAVIR (NORVIR) 100 MG CAPSULE    Take 1 capsule (100 mg total) by mouth at bedtime.  Modified Medications   No medications on file  Discontinued Medications   No medications on file    Subjective: Gabriel [name] is in for his routine followup visit. He states that he has been consistently taking his Truvada, Prezista and Norvir since he was hospitalized in January. He did lose who is health insurance when he lost his previous job but he has a stalk pile of medications at home that should last him several months. He hopes to be getting a new part-time job working at [employer] in [place] but will not get [employer] through his job. He has applied for [employer]. He still wants to quit smoking cigarettes but has no current plan. He has not called in [place] quit line.  About one year ago he noted a small bump on the shaft of his penis. He recently shaved to that area and could see the bump better. He started to squeeze the bump and it got a little bit larger. It is  nontender. It seems to be improving slowly over the past week.  Review of Systems: Constitutional: negative Eyes: negative Ears, nose, mouth, throat, and face: negative Respiratory: negative Cardiovascular: negative Gastrointestinal: negative Genitourinary:negative  Past Medical History  Diagnosis Date  . HIV positive   . MRSA infection   . Cellulitis     History  Substance Use Topics  . Smoking status: Current Every Day Smoker -- 1.00 packs/day    Types: Cigarettes  . Smokeless tobacco: Never Used  . Alcohol Use: 1.5 oz/week    3 drink(s) per week    Family History  Problem Relation Age of Onset  . Cancer Maternal Grandmother   . Crohn's disease Brother   . Hypertension Mother     No Known Allergies  Objective: Temp: 98.2 F (36.8 C) (07/10 1030) Temp src: Oral (07/10 1030) BP: 110/72 mmHg (07/10 1030) Pulse Rate: 84 (07/10 1030) Body mass index is 21.73 kg/(m^2).  General: He is well dressed and in no distress Oral: No oropharyngeal lesions Skin: No rash Lungs: Clear Cor: Regular S1-S2 no murmurs Abdomen: Soft and nontender Joints and extremities: Normal Neuro: Alert with normal speech and conversation GU: He has a 5 mm nodule on the left side of his penile shaft. There are no vesicles, or ulcers, cellulitis or drainage.  It appears to be a follicular based.  Lab Results Lab Results  Component Value Date   WBC 3.3* 04/24/2014   HGB 12.2* 04/24/2014   HCT 35.6* 04/24/2014   MCV 94.4 04/24/2014   PLT 178 04/24/2014    Lab Results  Component Value Date   CREATININE 0.87 04/24/2014   BUN 16 04/24/2014   NA 141 04/24/2014   K 3.6 04/24/2014   CL 105 04/24/2014   CO2 29 04/24/2014    Lab Results  Component Value Date   ALT 12 04/24/2014   AST 15 04/24/2014   ALKPHOS 65 04/24/2014   BILITOT 0.5 04/24/2014    Lab Results  Component Value Date   CHOL 115 04/24/2014   HDL 32* 04/24/2014   LDLCALC 30 04/24/2014   TRIG 263* 04/24/2014   CHOLHDL 3.6 04/24/2014   RPR 04/24/2014:  Reactive but down to 1:16  Lab Results HIV 1 RNA Quant (copies/mL)  Date Value  04/24/2014 44783*  11/10/2013 67300*  06/21/2011 50*     CD4 T Cell Abs (/uL)  Date Value  04/24/2014 200*  06/21/2011 300*  03/31/2011 360*     Assessment: Gabriel [name] load remains quite elevated while on therapy suggesting that his virus has developed further resistance. He recalls being told years ago that as far she developed resistance while he was taking Atripla. That is when he changed to his current regimen. He had negative tests for integrase resistance in January. I will repeat a genotype resistance testing and HLA B5701 in order to help choose a new salvage regimen.  For syphilis appears to be responding to the therapy he received in January. I will continue to monitor serial RPRs.  I encouraged him to call in [place] quit line.  I suspect that the lesion on his penis as an ingrown hair. I will check again on his next visit.   Plan: 1. Continue current antiretroviral regimen for now 2. Await results of genotype resistance test and HLA B5701 3. Cigarette cessation counseling provided 4. Followup in 3-4 weeks   Michel Bickers, MD [facility] for [place] [phone] pager   [phone] cell 05/02/2014, 10:50 AM

## 2014-05-08 LAB — HLA B*5701: HLA-B 5701 W/RFLX HLA-B HIGH: POSITIVE — AB

## 2014-05-12 ENCOUNTER — Other Ambulatory Visit: Payer: Self-pay | Admitting: *Deleted

## 2014-05-12 ENCOUNTER — Telehealth: Payer: Self-pay | Admitting: *Deleted

## 2014-05-12 DIAGNOSIS — B20 Human immunodeficiency virus [HIV] disease: Secondary | ICD-10-CM

## 2014-05-12 MED ORDER — EMTRICITABINE-TENOFOVIR DF 200-300 MG PO TABS: 1.0000 | ORAL_TABLET | Freq: Every day | ORAL | Status: DC

## 2014-05-12 MED ORDER — RITONAVIR 100 MG PO CAPS: 100.0000 mg | ORAL_CAPSULE | Freq: Every day | ORAL | Status: DC

## 2014-05-12 MED ORDER — DARUNAVIR ETHANOLATE 800 MG PO TABS: 800.0000 mg | ORAL_TABLET | Freq: Every day | ORAL | Status: DC

## 2014-05-12 NOTE — Telephone Encounter (Signed)
Refilled current regimen as per last office note.

## 2014-05-13 LAB — HIV-1 GENOTYPR PLUS

## 2014-05-14 ENCOUNTER — Other Ambulatory Visit: Payer: Self-pay | Admitting: *Deleted

## 2014-05-14 DIAGNOSIS — B20 Human immunodeficiency virus [HIV] disease: Secondary | ICD-10-CM

## 2014-05-14 MED ORDER — DARUNAVIR ETHANOLATE 800 MG PO TABS: 800.0000 mg | ORAL_TABLET | Freq: Every day | ORAL | Status: DC

## 2014-05-14 MED ORDER — RITONAVIR 100 MG PO CAPS: 100.0000 mg | ORAL_CAPSULE | Freq: Every day | ORAL | Status: DC

## 2014-05-14 MED ORDER — EMTRICITABINE-TENOFOVIR DF 200-300 MG PO TABS: 1.0000 | ORAL_TABLET | Freq: Every day | ORAL | Status: DC

## 2014-06-10 ENCOUNTER — Ambulatory Visit (INDEPENDENT_AMBULATORY_CARE_PROVIDER_SITE_OTHER): Payer: Self-pay | Admitting: Internal Medicine

## 2014-06-10 ENCOUNTER — Encounter: Payer: Self-pay | Admitting: Internal Medicine

## 2014-06-10 VITALS — BP 112/72 | HR 66 | Temp 98.4°F | Wt 190.0 lb

## 2014-06-10 DIAGNOSIS — L259 Unspecified contact dermatitis, unspecified cause: Secondary | ICD-10-CM

## 2014-06-10 DIAGNOSIS — L309 Dermatitis, unspecified: Secondary | ICD-10-CM | POA: Insufficient documentation

## 2014-06-10 DIAGNOSIS — Z23 Encounter for immunization: Secondary | ICD-10-CM

## 2014-06-10 DIAGNOSIS — B20 Human immunodeficiency virus [HIV] disease: Secondary | ICD-10-CM

## 2014-06-10 MED ORDER — HYDROCORTISONE 1 % EX OINT: 1.0000 "application " | TOPICAL_OINTMENT | Freq: Two times a day (BID) | CUTANEOUS | Status: DC

## 2014-06-10 MED ORDER — RITONAVIR 100 MG PO TABS: 100.0000 mg | ORAL_TABLET | Freq: Every day | ORAL | Status: DC

## 2014-06-10 NOTE — Progress Notes (Addendum)
Patient ID: Gabriel [name], male   DOB: [birth year], 38 y.o.   MRN: [id number]   [facility] Infect[facility] Disease - Pharmacist    HPI: Gabriel [name] is a 38 y.o. male here for follow-up of his lab results.  I was asked to speak with him regarding medication adherence.  Allergies: Allergies  Allergen Reactions  . 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 HIV positive   . MRSA infection   . Cellulitis     Social History: History   Social History  . Marital Status: Single    Spouse Name: N/A    Number of Children: N/A  . Years of Education: N/A   Social History Main Topics  . Smoking status: Current Every Day Smoker -- 1.00 packs/day    Types: Cigarettes  . Smokeless tobacco: Never Used  . Alcohol Use: 1.5 oz/week    3 drink(s) per week  . Drug Use: No     Comment: hx of multiple substances in the past  . Sexual Activity: Yes    Partners: Male     Comment: declined condoms   Other Topics Concern  . None   Social History Narrative   internet processor    Previous Regimen: Atripla Atazanavir/ritonavir, Truvada  Current Regimen: Darunavir/ritonavir, Truvada  Labs: HIV 1 RNA Quant (copies/mL)  Date Value  04/24/2014 44783*  11/10/2013 67300*  06/21/2011 50*     CD4 T Cell Abs (/uL)  Date Value  04/24/2014 200*  06/21/2011 300*  03/31/2011 360*     Hep B S Ab (no units)  Date Value  10/10/2007 Negative      Hepatitis B Surface Ag (no units)  Date Value  10/10/2007 Negative      HCV Ab (no units)  Date Value  10/10/2007 Negative    .HIV Genotype Composite Data Genotype Dates: 12/04/2007, 04/24/2014  Mutations in [place] impact drug susceptibility RT Mutations K103N  PI Mutations      Interpretation of Genotype Data per [employer] HIV Database Nucleoside RTIs  lamivudine (3TC) Susceptible abacavir (ABC) Susceptible zidovudine (AZT) Susceptible stavudine (D4T) Susceptible didanosine (DDI) Susceptible emtricitabine (FTC) Susceptible tenofovir (TDF) Susceptible   Non-Nucleoside RTIs  efavirenz (EFV) High-level resistance etravirine (ETR) Susceptible nevirapine (NVP) High-level resistance rilpivirine (RPV) Susceptible   CrCl: The CrCl is unknown because both a height and weight (above a minimum accepted value) are required for this calculation.  Lipids:    Component Value Date/Time   CHOL 115 04/24/2014 1139   TRIG 263* 04/24/2014 1139   HDL 32* 04/24/2014 1139   CHOLHDL 3.6 04/24/2014 1139   VLDL 53* 04/24/2014 1139   LDLCALC 30 04/24/2014 1139    Assessment: Zayin reports his adherence has improved.  He takes his medications at night, prior to bedtime.  He no longer uses a pill box since his Norvir capsule requires refrigeration.   He uses a smart phone frequently and expressed interest in a medication reminder app. 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  Recommendations: Change Norvir capsule to tablet formulation - does not require refrigeration He downloaded the Care4Today app prior to leaving the clinic and is going to set it up to issue  medication reminders. I added Abacavir to his allergy list as a contraindicated medication.  Norva Riffle, Pharm.D., BCPS, AAHIVP Clinical Infectious [facility] 06/10/2014, 10:13 AM

## 2014-06-10 NOTE — Progress Notes (Signed)
Patient ID: Gabriel [name], male   DOB: [birth year], 38 y.o.   MRN: [id number]          Patient Active Problem List   Diagnosis Date Noted  . HIV DISEASE 11/28/2006    Priority: High  . Dermatitis 06/10/2014  . Dyslipidemia 04/14/2011  . Blood in stool 03/20/2009  . 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 DENTAL CARIES 01/02/2009  . TOBACCO USER 05/27/2008  . INSOMNIA, CHRONIC 05/27/2008  . SYPHILIS NOS 11/28/2006    Patient's Medications  New Prescriptions   HYDROCORTISONE 1 % OINTMENT    Apply 1 application topically 2 (two) times daily.  Previous Medications   DARUNAVIR ETHANOLATE (PREZISTA) 800 MG TABLET    Take 1 tablet (800 mg total) by mouth at bedtime.   EMTRICITABINE-TENOFOVIR (TRUVADA) 200-300 MG PER TABLET    Take 1 tablet by mouth at bedtime.   NICOTINE ([facility]S NICOTINE TRANSDERMAL SYS) 14 MG/24HR PATCH    Place 1 patch (14 mg total) onto the skin daily.   RITONAVIR (NORVIR) 100 MG CAPSULE    Take 1 capsule (100 mg total) by mouth at bedtime.  Modified Medications   No medications on file  Discontinued Medications   AMOXICILLIN (AMOXIL) 500 MG CAPSULE    Take 1 capsule (500 mg total) by mouth 3 (three) times daily.    Subjective: Gabriel [name] is in for his routine followup. He states that he has not missed a single dose of his medications since his last visit. Prior to that he would frequently forget to take his medication or guitar to taking his medications and stopped for several weeks at a time. He is currently getting his medications through ADAP.  Past Medical History  Diagnosis Date  . HIV positive   . MRSA infection   . Cellulitis     History  Substance Use Topics  . Smoking status: Current Every Day Smoker -- 1.00 packs/day    Types: Cigarettes  . Smokeless tobacco: Never Used  . Alcohol Use: 1.5 oz/week    3 drink(s) per week    Family History  Problem Relation Age of Onset  . Cancer Maternal Grandmother   . Crohn's disease Brother    . Hypertension Mother     No Known Allergies  Objective: Temp: 98.4 F (36.9 C) (08/18 0940) Temp src: Oral (08/18 0940) BP: 112/72 mmHg (08/18 0940) Pulse Rate: 66 (08/18 0940) Body mass index is 21.96 kg/(m^2).  General: He is in good spirits Oral: No oropharyngeal lesions Skin: Chronic dermatitis of right hand Lungs: Clear Cor: Regular S1 and S2 with no murmurs  Lab Results Lab Results  Component Value Date   WBC 3.3* 04/24/2014   HGB 12.2* 04/24/2014   HCT 35.6* 04/24/2014   MCV 94.4 04/24/2014   PLT 178 04/24/2014    Lab Results  Component Value Date   CREATININE 0.87 04/24/2014   BUN 16 04/24/2014   NA 141 04/24/2014   K 3.6 04/24/2014   CL 105 04/24/2014   CO2 29 04/24/2014    Lab Results  Component Value Date   ALT 12 04/24/2014   AST 15 04/24/2014   ALKPHOS 65 04/24/2014   BILITOT 0.5 04/24/2014    Lab Results  Component Value Date   CHOL 115 04/24/2014   HDL 32* 04/24/2014   LDLCALC 30 04/24/2014   TRIG 263* 04/24/2014   CHOLHDL 3.6 04/24/2014    Lab Results HIV 1 RNA Quant (copies/mL)  Date Value  04/24/2014 44783*  11/10/2013 67300*  06/21/2011 50*     CD4 T Cell Abs (/uL)  Date Value  04/24/2014 200*  06/21/2011 300*  03/31/2011 360*     Assessment: It appears that his adherence has improved since his last visit when he saw that his CD4 count had declined to 200. His most recent genotype did not reveal any new mutations that would make his current regimen ineffective.  Plan: 1. Continue Truvada, Prezista and Norvir 2. Repeat lab work today 3. Adherence counseling provided 4. Followup in 4 weeks 5. Hydrocortisone cream for right hand dermatitis   Cliffton AstersJohn Nolah Krenzer, MD [facility] [phone] pager   [phone] cell 06/10/2014, 10:04 AM

## 2014-06-10 NOTE — Addendum Note (Signed)
Addended by: Sallee ProvencalURNER, Anella Nakata S on: 06/10/2014 10:24 AM   Modules accepted: Orders

## 2014-06-11 LAB — HIV-1 RNA QUANT-NO REFLEX-BLD
HIV 1 RNA Quant: 79 copies/mL — ABNORMAL HIGH (ref <20)
HIV-1 RNA Quant, Log: 1.9 {Log} — ABNORMAL HIGH (ref <1.30)

## 2014-06-12 NOTE — Addendum Note (Signed)
Addended by: Mariea ClontsGREEN, Geovanni Rahming D on: 06/12/2014 10:56 AM   Modules accepted: Orders

## 2014-06-20 ENCOUNTER — Encounter: Payer: Self-pay | Admitting: Internal Medicine

## 2014-07-08 ENCOUNTER — Ambulatory Visit: Payer: Self-pay | Admitting: Internal Medicine

## 2014-07-08 ENCOUNTER — Telehealth: Payer: Self-pay | Admitting: *Deleted

## 2014-07-08 NOTE — Telephone Encounter (Signed)
Patient missed today's appointment. Unable to leave voicemail, as it has not yet been set up. This it the patient's 3rd no show in the last 12 months. Andree Coss, RN

## 2014-12-17 ENCOUNTER — Other Ambulatory Visit: Payer: Self-pay | Admitting: Internal Medicine

## 2014-12-17 ENCOUNTER — Other Ambulatory Visit (INDEPENDENT_AMBULATORY_CARE_PROVIDER_SITE_OTHER): Payer: Self-pay

## 2014-12-17 DIAGNOSIS — B20 Human immunodeficiency virus [HIV] disease: Secondary | ICD-10-CM

## 2014-12-17 DIAGNOSIS — Z113 Encounter for screening for infections with a predominantly sexual mode of transmission: Secondary | ICD-10-CM

## 2014-12-17 LAB — CBC WITH DIFFERENTIAL/PLATELET
Basophils Absolute: 0 10*3/uL (ref 0.0–0.1)
Basophils Relative: 1 % (ref 0–1)
EOS ABS: 0.1 10*3/uL (ref 0.0–0.7)
EOS PCT: 2 % (ref 0–5)
HEMATOCRIT: 43 % (ref 39.0–52.0)
Hemoglobin: 14.8 g/dL (ref 13.0–17.0)
Lymphocytes Relative: 43 % (ref 12–46)
Lymphs Abs: 1.3 10*3/uL (ref 0.7–4.0)
MCH: 32.2 pg (ref 26.0–34.0)
MCHC: 34.4 g/dL (ref 30.0–36.0)
MCV: 93.7 fL (ref 78.0–100.0)
MONOS PCT: 11 % (ref 3–12)
MPV: 11.9 fL (ref 8.6–12.4)
Monocytes Absolute: 0.3 10*3/uL (ref 0.1–1.0)
Neutro Abs: 1.3 10*3/uL — ABNORMAL LOW (ref 1.7–7.7)
Neutrophils Relative %: 43 % (ref 43–77)
PLATELETS: 170 10*3/uL (ref 150–400)
RBC: 4.59 MIL/uL (ref 4.22–5.81)
RDW: 14.4 % (ref 11.5–15.5)
WBC: 3 10*3/uL — ABNORMAL LOW (ref 4.0–10.5)

## 2014-12-17 LAB — COMPREHENSIVE METABOLIC PANEL
ALBUMIN: 4.2 g/dL (ref 3.5–5.2)
ALT: 19 U/L (ref 0–53)
AST: 19 U/L (ref 0–37)
Alkaline Phosphatase: 72 U/L (ref 39–117)
BUN: 17 mg/dL (ref 6–23)
CALCIUM: 8.9 mg/dL (ref 8.4–10.5)
CHLORIDE: 108 meq/L (ref 96–112)
CO2: 23 mEq/L (ref 19–32)
Creat: 0.92 mg/dL (ref 0.50–1.35)
GLUCOSE: 88 mg/dL (ref 70–99)
POTASSIUM: 3.9 meq/L (ref 3.5–5.3)
Sodium: 140 mEq/L (ref 135–145)
Total Bilirubin: 0.4 mg/dL (ref 0.2–1.2)
Total Protein: 7.4 g/dL (ref 6.0–8.3)

## 2014-12-18 ENCOUNTER — Other Ambulatory Visit: Payer: Self-pay | Admitting: *Deleted

## 2014-12-18 DIAGNOSIS — B20 Human immunodeficiency virus [HIV] disease: Secondary | ICD-10-CM

## 2014-12-18 LAB — URINE CYTOLOGY ANCILLARY ONLY
Chlamydia: NEGATIVE
Neisseria Gonorrhea: NEGATIVE

## 2014-12-18 LAB — T-HELPER CELL (CD4) - (RCID CLINIC ONLY)
CD4 % Helper T Cell: 23 % — ABNORMAL LOW (ref 33–55)
CD4 T Cell Abs: 300 /uL — ABNORMAL LOW (ref 400–2700)

## 2014-12-18 LAB — HIV-1 RNA QUANT-NO REFLEX-BLD
HIV 1 RNA Quant: <20 copies/mL (ref <20)
HIV-1 RNA Quant, Log: <1.3 {Log} (ref <1.30)

## 2014-12-18 MED ORDER — EMTRICITABINE-TENOFOVIR DF 200-300 MG PO TABS: 1.0000 | ORAL_TABLET | Freq: Every day | ORAL | Status: DC

## 2014-12-18 MED ORDER — DARUNAVIR ETHANOLATE 800 MG PO TABS: 800.0000 mg | ORAL_TABLET | Freq: Every day | ORAL | Status: DC

## 2014-12-18 MED ORDER — RITONAVIR 100 MG PO TABS: 100.0000 mg | ORAL_TABLET | Freq: Every day | ORAL | Status: DC

## 2014-12-18 NOTE — Telephone Encounter (Signed)
ADAP Application 

## 2015-01-06 ENCOUNTER — Ambulatory Visit (INDEPENDENT_AMBULATORY_CARE_PROVIDER_SITE_OTHER): Payer: Self-pay | Admitting: Internal Medicine

## 2015-01-06 ENCOUNTER — Encounter: Payer: Self-pay | Admitting: Internal Medicine

## 2015-01-06 DIAGNOSIS — B20 Human immunodeficiency virus [HIV] disease: Secondary | ICD-10-CM

## 2015-01-06 DIAGNOSIS — Z113 Encounter for screening for infections with a predominantly sexual mode of transmission: Secondary | ICD-10-CM

## 2015-01-06 LAB — RPR: RPR Ser Ql: REACTIVE — AB

## 2015-01-06 LAB — RPR TITER

## 2015-01-06 MED ORDER — DARUNAVIR-COBICISTAT 800-150 MG PO TABS: 1.0000 | ORAL_TABLET | Freq: Every day | ORAL | Status: DC

## 2015-01-06 NOTE — Progress Notes (Signed)
HPI: Gabriel [name] is a 39 y.o. male who presents to the [facility] today for follow-up of his HIV infection.  Allergies: Allergies  Allergen Reactions  . Abacavir     HLA B5701 positive - should not receive abacavir due to risk of hypersensitivity    Vitals: Temp: 98 F (36.7 C) (03/15 1100) Temp Source: Oral (03/15 1100) BP: 117/73 mmHg (03/15 1100) Pulse Rate: 85 (03/15 1100)  Past Medical History: Past Medical History  Diagnosis Date  . HIV positive   . MRSA infection   . Cellulitis     Social History: History   Social History  . Marital Status: Single    Spouse Name: N/A  . Number of Children: N/A  . Years of Education: N/A   Social History Main Topics  . Smoking status: Current Every Day Smoker -- 1.00 packs/day    Types: Cigarettes  . Smokeless tobacco: Never Used  . Alcohol Use: 1.8 oz/week    3 Standard drinks or equivalent per week  . Drug Use: No     Comment: hx of multiple substances in the past  . Sexual Activity:    Partners: Male     Comment: declined condoms   Other Topics Concern  . None   Social History Narrative   internet processor   Current Regimen: Prezista + Norvir + Truvada  Labs: HIV 1 RNA QUANT (copies/mL)  Date Value  12/17/2014 <20  06/10/2014 79*  04/24/2014 44783*   CD4 T CELL ABS  Date Value  12/17/2014 300 /uL*  04/24/2014 200 /uL*  06/21/2011 300 cmm*   HEP B S AB (no units)  Date Value  10/10/2007 Negative   HEPATITIS B SURFACE AG (no units)  Date Value  10/10/2007 Negative   HCV AB (no units)  Date Value  10/10/2007 Negative    CrCl: Estimated Creatinine Clearance: 136.9 mL/min (by C-G formula based on Cr of 0.92).  Lipids:    Component Value Date/Time   CHOL 115 04/24/2014 1139   TRIG 263* 04/24/2014 1139   HDL 32* 04/24/2014 1139   CHOLHDL 3.6 04/24/2014 1139   VLDL 53* 04/24/2014 1139   LDLCALC 30 04/24/2014 1139    Assessment: 39 yo m who presents to the [facility] today for follow-up of HIV  infection. He is currently taking Prezista, Norvir, and Truvada.  I educated him about combining his two medications (Prezista and Norvir) and starting on Prezcobix.  He has no trouble taking medications and is agreeable to taking that. I educated him on compliance and remembering to take his medication at the same time every day with a meal.   Recommendations: Discontinue Prezista and Norvir Start Prezcobix Continue Truvada  Jimmie Dattilio L. Nicole Kindred, PharmD Clinical Infectious Disease [place] 01/06/2015, 3:31 PM

## 2015-01-06 NOTE — Progress Notes (Signed)
Patient ID: Gabriel [name], male   DOB: 1976-08-18, 39 y.o.   MRN: [id number]          Patient Active Problem List   Diagnosis Date Noted  . Human immunodeficiency virus (HIV) disease 11/28/2006    Priority: High  . Dermatitis 06/10/2014  . Dyslipidemia 04/14/2011  . Blood in stool 03/20/2009  . 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 DENTAL CARIES 01/02/2009  . TOBACCO USER 05/27/2008  . INSOMNIA, CHRONIC 05/27/2008  . SYPHILIS NOS 11/28/2006    Patient's Medications  New Prescriptions   DARUNAVIR-COBICISTAT (PREZCOBIX) 800-150 MG PER TABLET    Take 1 tablet by mouth daily. Swallow whole. Do NOT crush, break or chew tablets. Take with food.  Previous Medications   EMTRICITABINE-TENOFOVIR (TRUVADA) 200-300 MG PER TABLET    Take 1 tablet by mouth at bedtime.   HYDROCORTISONE 1 % OINTMENT    Apply 1 application topically 2 (two) times daily.   NICOTINE ([employer] NICOTINE TRANSDERMAL SYS) 14 MG/24HR PATCH    Place 1 patch (14 mg total) onto the skin daily.  Modified Medications   No medications on file  Discontinued Medications   DARUNAVIR ETHANOLATE (PREZISTA) 800 MG TABLET    Take 1 tablet (800 mg total) by mouth at bedtime.   RITONAVIR (NORVIR) 100 MG TABS TABLET    Take 1 tablet (100 mg total) by mouth at bedtime.    Subjective: Gabriel [name] is in for his routine HIV follow-up visit. He has had no problems obtaining or tolerating his Truvada, Prezista and Norvir. He does not recall missing any doses since his last visit. He is continuing to smoke cigarettes and has no current plan to quit. He has not been sexually active since his last visit. He is feeling well. Review of Systems: Constitutional: negative Eyes: negative Ears, nose, mouth, throat, and face: negative Respiratory: negative Cardiovascular: negative Gastrointestinal: negative Genitourinary:negative  Past Medical History  Diagnosis Date  . HIV positive   . MRSA infection   . Cellulitis      History  Substance Use Topics  . Smoking status: Current Every Day Smoker -- 1.00 packs/day    Types: Cigarettes  . Smokeless tobacco: Never Used  . Alcohol Use: 1.8 oz/week    3 Standard drinks or equivalent per week    Family History  Problem Relation Age of Onset  . Cancer Maternal Grandmother   . Crohn's disease Brother   . Hypertension Mother     Allergies  Allergen Reactions  . 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  General: His weight is up 11 pounds to 196 since starting antiretroviral therapy last year Oral: No oropharyngeal lesions. Teeth are in good condition Skin: No rash Lungs: Clear Cor: Regular S1 and S2 no murmurs Abdomen: Soft and nontender Mood: Normal  Lab Results Lab Results  Component Value Date   WBC 3.0* 12/17/2014   HGB 14.8 12/17/2014   HCT 43.0 12/17/2014   MCV 93.7 12/17/2014   PLT 170 12/17/2014    Lab Results  Component Value Date   CREATININE 0.92 12/17/2014   BUN 17 12/17/2014   NA 140 12/17/2014   K 3.9 12/17/2014   CL 108 12/17/2014   CO2 23 12/17/2014  Lab Results  Component Value Date   ALT 19 12/17/2014   AST 19 12/17/2014   ALKPHOS 72 12/17/2014   BILITOT 0.4 12/17/2014    Lab Results  Component Value Date   CHOL 115 04/24/2014   HDL 32* 04/24/2014   LDLCALC 30 04/24/2014   TRIG 263* 04/24/2014   CHOLHDL 3.6 04/24/2014    Lab Results HIV 1 RNA QUANT (copies/mL)  Date Value  12/17/2014 <20  06/10/2014 79*  04/24/2014 44783*   CD4 T CELL ABS  Date Value  12/17/2014 300 /uL*  04/24/2014 200 /uL*  06/21/2011 300 cmm*     Assessment: His HIV infection has come under much better control since starting antiretroviral therapy last summer. I will simplify his regimen to  Truvada and Prezcobix.  I will repeat an RPR today to make certain that he was adequately treated for syphilis last year.  I talked him about the importance of cigarette cessation to his long-term health and encouraged him to come up with a plan to quit and a quit date.  His normocytic anemia has resolved since starting antiretroviral therapy.  Plan: 1. Change Prezista and Norvir to Prezcobix and continue along with Truvada 2. Cigarette cessation counseling provided 3. Check RPR today 4. Prevention for positive counseling provided 5. Follow-up after lab work in [place] months   Michel Bickers, MD [facility] for [place] [phone] pager   [phone] cell 01/06/2015, 11:36 AM

## 2015-01-07 LAB — FLUORESCENT TREPONEMAL AB(FTA)-IGG-BLD: Fluorescent Treponemal ABS: REACTIVE — AB

## 2015-03-20 ENCOUNTER — Other Ambulatory Visit: Payer: Self-pay | Admitting: Internal Medicine

## 2015-03-26 ENCOUNTER — Encounter: Payer: Self-pay | Admitting: Internal Medicine

## 2015-03-26 ENCOUNTER — Ambulatory Visit (INDEPENDENT_AMBULATORY_CARE_PROVIDER_SITE_OTHER): Payer: Self-pay | Admitting: Internal Medicine

## 2015-03-26 VITALS — BP 126/79 | HR 70 | Temp 97.9°F | Ht 78.0 in | Wt 193.5 lb

## 2015-03-26 DIAGNOSIS — B20 Human immunodeficiency virus [HIV] disease: Secondary | ICD-10-CM

## 2015-03-26 DIAGNOSIS — K921 Melena: Secondary | ICD-10-CM

## 2015-03-26 DIAGNOSIS — A539 Syphilis, unspecified: Secondary | ICD-10-CM

## 2015-03-26 LAB — CBC
HEMATOCRIT: 44.6 % (ref 39.0–52.0)
Hemoglobin: 14.9 g/dL (ref 13.0–17.0)
MCH: 31.2 pg (ref 26.0–34.0)
MCHC: 33.4 g/dL (ref 30.0–36.0)
MCV: 93.3 fL (ref 78.0–100.0)
MPV: 11.6 fL (ref 8.6–12.4)
Platelets: 186 10*3/uL (ref 150–400)
RBC: 4.78 MIL/uL (ref 4.22–5.81)
RDW: 13.8 % (ref 11.5–15.5)
WBC: 5.1 10*3/uL (ref 4.0–10.5)

## 2015-03-26 LAB — COMPREHENSIVE METABOLIC PANEL
ALBUMIN: 4.2 g/dL (ref 3.5–5.2)
ALT: 20 U/L (ref 0–53)
AST: 19 U/L (ref 0–37)
Alkaline Phosphatase: 80 U/L (ref 39–117)
BUN: 14 mg/dL (ref 6–23)
CO2: 25 meq/L (ref 19–32)
Calcium: 9 mg/dL (ref 8.4–10.5)
Chloride: 106 mEq/L (ref 96–112)
Creat: 0.96 mg/dL (ref 0.50–1.35)
Glucose, Bld: 82 mg/dL (ref 70–99)
POTASSIUM: 3.9 meq/L (ref 3.5–5.3)
SODIUM: 140 meq/L (ref 135–145)
TOTAL PROTEIN: 7.3 g/dL (ref 6.0–8.3)
Total Bilirubin: 0.4 mg/dL (ref 0.2–1.2)

## 2015-03-26 NOTE — Progress Notes (Addendum)
Patient ID: Gabriel [name], male   DOB: Nov 19, 1975, 39 y.o.   MRN: [id number]          Patient Active Problem List   Diagnosis Date Noted  . Human immunodeficiency virus (HIV) disease 11/28/2006    Priority: High  . Hematochezia 03/26/2015  . Dermatitis 06/10/2014  . Dyslipidemia 04/14/2011  . Blood in stool 03/20/2009  . 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 DENTAL CARIES 01/02/2009  . TOBACCO USER 05/27/2008  . INSOMNIA, CHRONIC 05/27/2008  . SYPHILIS NOS 11/28/2006    Patient's Medications  New Prescriptions   No medications on file  Previous Medications   DARUNAVIR-COBICISTAT (PREZCOBIX) 800-150 MG PER TABLET    Take 1 tablet by mouth daily. Swallow whole. Do NOT crush, break or chew tablets. Take with food.   HYDROCORTISONE 1 % OINTMENT    Apply 1 application topically 2 (two) times daily.   NICOTINE ([employer] NICOTINE TRANSDERMAL SYS) 14 MG/24HR PATCH    Place 1 patch (14 mg total) onto the skin daily.   TRUVADA 200-300 MG PER TABLET    TAKE 1 TABLET BY MOUTH EVERY NIGHT AT BEDTIME  Modified Medications   No medications on file  Discontinued Medications   No medications on file    Subjective: Ozzy is seen on a work in basis today. He called yesterday to let us know that he has been having intermittent blood in his stool for the past month. He has a history of this in the past and he says that generally that occurred when he was having receptive anal intercourse but he also admits that he does not remember much about prior episodes because he was " doing lots of drugs back then". He says he has had 2 prior colonoscopies. He believes they were done at [facility].  He denies being sexually active recently. He states that he has not been constipated but does occasionally strain to pass a stool. He has not had any pain. He has had some intermittent diarrhea and thinks that may be related to eating certain types of food. He has not had  any abdominal pain, nausea, vomiting or fever. He states that he is worried that he [place] have some mass although he has not felt any swelling or growth. He thinks his father had a history of bowel obstruction and one brother has Crohn's disease.  He denies missing any doses of his Truvada or Prezcobix.  He still has no plan to completely quit smoking cigarettes.  Review of Systems: Constitutional: positive for malaise, negative for anorexia, chills, fevers, sweats and weight loss Eyes: negative Ears, nose, mouth, throat, and face: negative Respiratory: negative Cardiovascular: negative Gastrointestinal: as noted in history of present illness Genitourinary:negative  Past Medical History  Diagnosis Date  . HIV positive   . MRSA infection   . Cellulitis     History  Substance Use Topics  . Smoking status: Current Every Day Smoker -- 1.00 packs/day    Types: Cigarettes  . Smokeless tobacco: Never Used  . Alcohol Use: 1.8 oz/week    3 Standard drinks or equivalent per week    Family History  Problem Relation Age of Onset  . Cancer Maternal Grandmother   . Crohn's disease Brother   . Hypertension Mother     Allergies  Allergen Reactions  . Abacavir     HLA B5701 positive - should not receive abacavir due to risk of hypersensitivity    Objective: Temp: 97.9 F (  36.6 C) (06/02 1336) Temp Source: Oral (06/02 1336) BP: 126/79 mmHg (06/02 1336) Pulse Rate: 70 (06/02 1336) Body mass index is 22.37 kg/(m^2).  General: He is in good spirits Oral: No oropharyngeal lesions Skin: No rash Lungs: Clear Cor: Regular S1 and S2 with no murmurs Abdomen: Soft and nontender with no palpable masses Rectal: Normal external and internal exam. Normal soft brown stool without obvious blood  Lab Results Lab Results  Component Value Date   WBC 3.0* 12/17/2014   HGB 14.8 12/17/2014   HCT 43.0 12/17/2014   MCV 93.7 12/17/2014   PLT 170 12/17/2014    Lab Results  Component Value  Date   CREATININE 0.92 12/17/2014   BUN 17 12/17/2014   NA 140 12/17/2014   K 3.9 12/17/2014   CL 108 12/17/2014   CO2 23 12/17/2014    Lab Results  Component Value Date   ALT 19 12/17/2014   AST 19 12/17/2014   ALKPHOS 72 12/17/2014   BILITOT 0.4 12/17/2014    Lab Results  Component Value Date   CHOL 115 04/24/2014   HDL 32* 04/24/2014   LDLCALC 30 04/24/2014   TRIG 263* 04/24/2014   CHOLHDL 3.6 04/24/2014    Lab Results HIV 1 RNA QUANT (copies/mL)  Date Value  12/17/2014 <20  06/10/2014 79*  04/24/2014 44783*   CD4 T CELL ABS  Date Value  12/17/2014 300 /uL*  04/24/2014 200 /uL*  06/21/2011 300 cmm*     Assessment: The source of his possible hematochezia and other symptoms is unclear. We'll check Hemoccults today and repeat CBC and see him back within the next 2 weeks. If he has continued diarrhea I will have him bring a stool sample in for further testing.  Sounds like his adherence to his anti-retroviral regimen has been very good. I will continue that regimen and repeat CD4 and viral load today.  Repeat an RPR to see if he has been effectively treated for syphilis.  I talked to him again about the importance of, with a plan to quit smoking cigarettes.  Plan: 1. Continue current anti-retroviral therapy 2. Check Hemoccults, CBC and complete metabolic panel today 3. Repeat RPR 4. Cigarette cessation counseling provided 5. Start application forearm range card 6.    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  Michel Bickers, MD [facility] for [place] [facility] [phone] pager   [phone] cell 04/01/2015, 11:24 AM

## 2015-03-27 LAB — RPR TITER: RPR Titer: 1:64 {titer} — AB

## 2015-03-27 LAB — T-HELPER CELL (CD4) - (RCID CLINIC ONLY)
CD4 T CELL HELPER: 21 % — AB (ref 33–55)
CD4 T Cell Abs: 480 /uL (ref 400–2700)

## 2015-03-27 LAB — RPR: RPR Ser Ql: REACTIVE — AB

## 2015-03-27 LAB — FLUORESCENT TREPONEMAL AB(FTA)-IGG-BLD: FLUORESCENT TREPONEMAL ABS: REACTIVE — AB

## 2015-03-29 LAB — HIV-1 RNA QUANT-NO REFLEX-BLD
HIV 1 RNA QUANT: 44 {copies}/mL — AB (ref <20)
HIV-1 RNA Quant, Log: 1.64 {Log} — ABNORMAL HIGH (ref <1.30)

## 2015-03-31 LAB — CLOSTRIDIUM DIFFICILE BY PCR: Toxigenic C. Difficile by PCR: NOT DETECTED

## 2015-03-31 NOTE — Addendum Note (Signed)
Addended by: Jennet MaduroESTRIDGE, Mateya Torti D on: 03/31/2015 03:59 PM   Modules accepted: Orders

## 2015-04-01 NOTE — Progress Notes (Addendum)
Pt's RPR results reflect need for treatment for syphilis.  Dr. Orvan Falconerampbell gave verbal order for Bicillin 2.4 million units x 3 doses over 3 weeks.  Phone call to the pt.  Pt unable to come to begin treatment this week due to work conflicts.  Pt has a return appt with Dr. Orvan Falconerampbell on 04/08/15.  Will start weekly injections at that visit.

## 2015-04-01 NOTE — Addendum Note (Signed)
Addended by: Cliffton AstersAMPBELL, Theotis Gerdeman on: 04/01/2015 11:26 AM   Modules accepted: Orders

## 2015-04-08 ENCOUNTER — Encounter: Payer: Self-pay | Admitting: Internal Medicine

## 2015-04-08 ENCOUNTER — Ambulatory Visit (INDEPENDENT_AMBULATORY_CARE_PROVIDER_SITE_OTHER): Payer: Self-pay | Admitting: Internal Medicine

## 2015-04-08 VITALS — BP 120/73 | HR 69 | Wt 193.0 lb

## 2015-04-08 DIAGNOSIS — K921 Melena: Secondary | ICD-10-CM

## 2015-04-08 DIAGNOSIS — A539 Syphilis, unspecified: Secondary | ICD-10-CM

## 2015-04-08 DIAGNOSIS — B20 Human immunodeficiency virus [HIV] disease: Secondary | ICD-10-CM

## 2015-04-08 NOTE — Progress Notes (Signed)
Patient ID: Gabriel [name], male   DOB: [birth year], 39 y.o.   MRN: [id number]          Patient Active Problem List   Diagnosis Date Noted  . Human immunodeficiency virus (HIV) disease 11/28/2006    Priority: High  . Blood in stool 03/20/2009    Priority: Medium  . SYPHILIS NOS 11/28/2006    Priority: Medium  . Hematochezia 03/26/2015  . Dermatitis 06/10/2014  . Dyslipidemia 04/14/2011  . 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 DENTAL CARIES 01/02/2009  . TOBACCO USER 05/27/2008  . INSOMNIA, CHRONIC 05/27/2008    Patient's Medications  New Prescriptions   No medications on file  Previous Medications   DARUNAVIR-COBICISTAT (PREZCOBIX) 800-150 MG PER TABLET    Take 1 tablet by mouth daily. Swallow whole. Do NOT crush, break or chew tablets. Take with food.   HYDROCORTISONE 1 % OINTMENT    Apply 1 application topically 2 (two) times daily.   NICOTINE ([employer] NICOTINE TRANSDERMAL SYS) 14 MG/24HR PATCH    Place 1 patch (14 mg total) onto the skin daily.   TRUVADA 200-300 MG PER TABLET    TAKE 1 TABLET BY MOUTH EVERY NIGHT AT BEDTIME  Modified Medications   No medications on file  Discontinued Medications   No medications on file    Subjective: Gabriel [name] is in for his routine follow-up visit. He continues on Truvada and Prezcobix without difficulty and has not missed doses. He continues to be bothered by hematochezia. This is largely painless but he also notes that after a soft bowel movement he will frequently feel like he still needs to pass more stool. He also recently was found to have reactivation of his RPR to a level of 1:64. He started back on treatment for lately syphilis. He initially told me that he had not been sexually active but then kind of laughs and tells me that he is certain that he use had sex at least once in the last 6 months. Review of Systems: Constitutional: negative Eyes: negative Ears, nose, mouth, throat, and face: negative Respiratory:  negative Cardiovascular: negative Gastrointestinal: as noted in history of present illness Genitourinary:negative  Past Medical History  Diagnosis Date  . HIV positive   . MRSA infection   . Cellulitis     History  Substance Use Topics  . Smoking status: Current Every Day Smoker -- 1.00 packs/day    Types: Cigarettes  . Smokeless tobacco: Never Used  . Alcohol Use: 1.8 oz/week    3 Standard drinks or equivalent per week    Family History  Problem Relation Age of Onset  . Cancer Maternal Grandmother   . Crohn's disease Brother   . Hypertension Mother     Allergies  Allergen Reactions  . Abacavir     HLA B5701 positive - should not receive abacavir due to risk of hypersensitivity    Objective: BP: 120/73 mmHg (06/15 1522) Pulse Rate: 69 (06/15 1522) Body mass index is 22.31 kg/(m^2).  General: He is in good spirits Oral: No oropharyngeal lesions Skin: No rash Lungs: Clear Cor: Regular S1 and S2 with no murmur Abdomen: Soft and nontender with no palpable masses. Stool guaiac last week negative. He has cell phone photos of his stool with what appears to be bright red blood on the outside  Lab Results Lab Results  Component Value Date   WBC 5.1 03/26/2015   HGB 14.9 03/26/2015   HCT 44.6 03/26/2015   MCV 93.3 03/26/2015  PLT 186 03/26/2015    Lab Results  Component Value Date   CREATININE 0.96 03/26/2015   BUN 14 03/26/2015   NA 140 03/26/2015   K 3.9 03/26/2015   CL 106 03/26/2015   CO2 25 03/26/2015    Lab Results  Component Value Date   ALT 20 03/26/2015   AST 19 03/26/2015   ALKPHOS 80 03/26/2015   BILITOT 0.4 03/26/2015    Lab Results  Component Value Date   CHOL 115 04/24/2014   HDL 32* 04/24/2014   LDLCALC 30 04/24/2014   TRIG 263* 04/24/2014   CHOLHDL 3.6 04/24/2014    Lab Results HIV 1 RNA QUANT (copies/mL)  Date Value  03/26/2015 44*  12/17/2014 <20  06/10/2014 79*   CD4 T CELL ABS (/uL)  Date Value  03/26/2015 480    12/17/2014 300*  04/24/2014 200*     Assessment: His HIV infection is under good control and he is having CD4 reconstitution.  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 Unfortunately he does not live in [facility] so he is not a candidate for and orange card. This will make it difficult to impossible to make a referral for GI or general surgery evaluation.  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 I talked him about the importance of limiting partners and partner selection. 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  He is not ready to quit smoking cigarettes yet  Plan: 1. Continue Truvada and Prezcobix 2. Complete treatment plan for syphilis 3. Follow-up in 6 weeks   Michel Bickers, MD [facility] for [place] [phone] pager   [phone] cell 04/08/2015, 4:01 PM

## 2015-05-12 ENCOUNTER — Telehealth: Payer: Self-pay | Admitting: *Deleted

## 2015-05-12 NOTE — Telephone Encounter (Signed)
Patient called stating he has had severe diarrhea for several days and actually missed work for two days. He needs a note for work and I gave him an appointment to see Dr. Orvan Falconerampbell for tomorrow as he is still having issues with diarrhea. No PCP per patient.

## 2015-05-13 ENCOUNTER — Encounter: Payer: Self-pay | Admitting: Internal Medicine

## 2015-05-13 ENCOUNTER — Ambulatory Visit (INDEPENDENT_AMBULATORY_CARE_PROVIDER_SITE_OTHER): Payer: Self-pay | Admitting: Internal Medicine

## 2015-05-13 VITALS — BP 91/59 | HR 80 | Temp 98.1°F | Wt 183.5 lb

## 2015-05-13 DIAGNOSIS — B20 Human immunodeficiency virus [HIV] disease: Secondary | ICD-10-CM

## 2015-05-13 DIAGNOSIS — Z23 Encounter for immunization: Secondary | ICD-10-CM

## 2015-05-13 MED ORDER — EMTRICITABINE-TENOFOVIR AF 200-25 MG PO TABS: 1.0000 | ORAL_TABLET | Freq: Every day | ORAL | Status: DC

## 2015-05-13 NOTE — Progress Notes (Signed)
Patient ID: Gabriel [name], male   DOB: 10/08/1976, 39 y.o.   MRN: [id number]          Patient Active Problem List   Diagnosis Date Noted  . Human immunodeficiency virus (HIV) disease 11/28/2006    Priority: High  . Blood in stool 03/20/2009    Priority: Medium  . SYPHILIS NOS 11/28/2006    Priority: Medium  . Hematochezia 03/26/2015  . Dermatitis 06/10/2014  . Dyslipidemia 04/14/2011  . 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 DENTAL CARIES 01/02/2009  . TOBACCO USER 05/27/2008  . INSOMNIA, CHRONIC 05/27/2008    Patient's Medications  New Prescriptions   EMTRICITABINE-TENOFOVIR AF (DESCOVY) 200-25 MG PER TABLET    Take 1 tablet by mouth daily.  Previous Medications   DARUNAVIR-COBICISTAT (PREZCOBIX) 800-150 MG PER TABLET    Take 1 tablet by mouth daily. Swallow whole. Do NOT crush, break or chew tablets. Take with food.   HYDROCORTISONE 1 % OINTMENT    Apply 1 application topically 2 (two) times daily.   NICOTINE ([employer] NICOTINE TRANSDERMAL SYS) 14 MG/24HR PATCH    Place 1 patch (14 mg total) onto the skin daily.  Modified Medications   No medications on file  Discontinued Medications   TRUVADA 200-300 MG PER TABLET    TAKE 1 TABLET BY MOUTH EVERY NIGHT AT BEDTIME    Subjective: Delmo is seen on a work in basis. He has not had any further problems with blood in his stool in the past several weeks but he developed severe constipation and took one Ex-Lax several days ago. This caused him to develop fairly severe diarrhea that lasted for 48 hours. He had to miss work for 2 days. He did not have any fever or chills. He was able to take his medication and has not missed doses. He is finally feeling better today. Review of Systems: Pertinent items are noted in HPI.  Past Medical History  Diagnosis Date  . HIV positive   . MRSA infection   . Cellulitis     History  Substance Use Topics  . Smoking status: Current Every Day Smoker -- 1.00 packs/day    Types:  Cigarettes  . Smokeless tobacco: Never Used  . Alcohol Use: 1.8 oz/week    3 Standard drinks or equivalent per week    Family History  Problem Relation Age of Onset  . Cancer Maternal Grandmother   . Crohn's disease Brother   . Hypertension Mother     Allergies  Allergen Reactions  . Abacavir     HLA B5701 positive - should not receive abacavir due to risk of hypersensitivity    Objective: Temp: 98.1 F (36.7 C) (07/20 1357) Temp Source: Oral (07/20 1357) BP: 91/59 mmHg (07/20 1357) Pulse Rate: 80 (07/20 1357) Body mass index is 21.21 kg/(m^2).  General: He is comfortable and in good spirits Lungs: Clear Cor: Regular S1 and S2 with no murmurs Abdomen: Soft and nontender  Lab Results Lab Results  Component Value Date   WBC 5.1 03/26/2015   HGB 14.9 03/26/2015   HCT 44.6 03/26/2015   MCV 93.3 03/26/2015   PLT 186 03/26/2015    Lab Results  Component Value Date   CREATININE 0.96 03/26/2015   BUN 14 03/26/2015   NA 140 03/26/2015   K 3.9 03/26/2015   CL 106 03/26/2015   CO2 25 03/26/2015    Lab Results  Component Value Date   ALT 20 03/26/2015   AST 19 03/26/2015  ALKPHOS 80 03/26/2015   BILITOT 0.4 03/26/2015    Lab Results  Component Value Date   CHOL 115 04/24/2014   HDL 32* 04/24/2014   LDLCALC 30 04/24/2014   TRIG 263* 04/24/2014   CHOLHDL 3.6 04/24/2014    Lab Results HIV 1 RNA QUANT (copies/mL)  Date Value  03/26/2015 44*  12/17/2014 <20  06/10/2014 79*   CD4 T CELL ABS (/uL)  Date Value  03/26/2015 480  12/17/2014 300*  04/24/2014 200*     Assessment: He had a stronger than expected reaction to laxity of this but has recovered now.  I will change his Truvada to the safer version called Descovy and continue Prezcobix.  Plan: 1. Change Truvada to Descovy 2. Continue Prezcobix 3. I encouraged him to consider cigarette cessation 4. Follow-up at previously scheduled visit   Michel Bickers, MD [facility] for [place] [phone] pager   [phone] cell 05/13/2015, 2:27 PM

## 2015-05-26 ENCOUNTER — Ambulatory Visit: Payer: Self-pay

## 2015-09-07 ENCOUNTER — Emergency Department
Admission: EM | Admit: 2015-09-07 | Discharge: 2015-09-07 | Disposition: A | Discharge status: Home / Self Care | Payer: Self-pay | Attending: Emergency Medicine | Admitting: Emergency Medicine

## 2015-09-07 ENCOUNTER — Encounter: Payer: Self-pay | Admitting: Emergency Medicine

## 2015-09-07 DIAGNOSIS — J040 Acute laryngitis: Secondary | ICD-10-CM | POA: Insufficient documentation

## 2015-09-07 DIAGNOSIS — J029 Acute pharyngitis, unspecified: Secondary | ICD-10-CM

## 2015-09-07 DIAGNOSIS — F17210 Nicotine dependence, cigarettes, uncomplicated: Secondary | ICD-10-CM | POA: Insufficient documentation

## 2015-09-07 DIAGNOSIS — Z79899 Other long term (current) drug therapy: Secondary | ICD-10-CM | POA: Insufficient documentation

## 2015-09-07 LAB — POCT RAPID STREP A: Streptococcus, Group A Screen (Direct): NEGATIVE

## 2015-09-07 MED ORDER — MAGIC MOUTHWASH
10.0000 mL | Freq: Once | ORAL | Status: AC
  Administered 2015-09-07: 10 mL via ORAL
  Filled 2015-09-07: qty 10

## 2015-09-07 MED ORDER — MAGIC MOUTHWASH
10.0000 mL | Freq: Three times a day (TID) | ORAL | Status: DC | PRN
Start: 2015-09-07 — End: 2017-08-29

## 2015-09-07 NOTE — ED Notes (Addendum)
Pt says "I'm not sick, I just lost my voice"; nonproductive cough; sinus drainage

## 2015-09-07 NOTE — Discharge Instructions (Signed)
1. Use Magic mouthwash 3 times daily as needed for throat pain. 2. Return to the ER for worsening symptoms, persistent vomiting, difficulty breathing or other concerns.  Laryngitis Laryngitis is inflammation of your vocal cords. This causes hoarseness, coughing, loss of voice, sore throat, or a dry throat. Your vocal cords are two bands of muscles that are found in your throat. When you speak, these cords come together and vibrate. These vibrations come out through your mouth as sound. When your vocal cords are inflamed, your voice sounds different. Laryngitis can be temporary (acute) or long-term (chronic). Most cases of acute laryngitis improve with time. Chronic laryngitis is laryngitis that lasts for more than three weeks. CAUSES Acute laryngitis may be caused by:  A viral infection.  Lots of talking, yelling, or singing. This is also called vocal strain.  Bacterial infections. Chronic laryngitis may be caused by:  Vocal strain.  Injury to your vocal cords.  Acid reflux (gastroesophageal reflux disease or GERD).  Allergies.  Sinus infection.  Smoking.  Alcohol abuse.  Breathing in chemicals or dust.  Growths on the vocal cords. RISK FACTORS Risk factors for laryngitis include:  Smoking.  Alcohol abuse.  Having allergies. SIGNS AND SYMPTOMS Symptoms of laryngitis may include:  Low, hoarse voice.  Loss of voice.  Dry cough.  Sore throat.  Stuffy nose. DIAGNOSIS Laryngitis may be diagnosed by:  Physical exam.  Throat culture.  Blood test.  Laryngoscopy. This procedure allows your health care provider to look at your vocal cords with a mirror or viewing tube. TREATMENT Treatment for laryngitis depends on what is causing it. Usually, treatment involves resting your voice and using medicines to soothe your throat. However, if your laryngitis is caused by a bacterial infection, you may need to take antibiotic medicine. If your laryngitis is caused by a  growth, you may need to have a procedure to remove it. HOME CARE INSTRUCTIONS  Drink enough fluid to keep your urine clear or pale yellow.  Breathe in moist air. Use a humidifier if you live in a dry climate.  Take medicines only as directed by your health care provider.  If you were prescribed an antibiotic medicine, finish it all even if you start to feel better.  Do not smoke cigarettes or electronic cigarettes. If you need help quitting, ask your health care provider.  Talk as little as possible. Also avoid whispering, which can cause vocal strain.  Write instead of talking. Do this until your voice is back to normal. SEEK MEDICAL CARE IF:  You have a fever.  You have increasing pain.  You have difficulty swallowing. SEEK IMMEDIATE MEDICAL CARE IF:  You cough up blood.  You have trouble breathing.   This information is not intended to replace advice given to you by your health care provider. Make sure you discuss any questions you have with your health care provider.   Document Released: 10/10/2005 Document Revised: 10/31/2014 Document Reviewed: 03/25/2014 Elsevier Interactive Patient Education 2016 Else[employer] Inc[employer]  Sore Throat A sore throat is pain, burning, irritation, or scratchiness of the throat. There is often pain or tenderness when swallowing or talking. A sore throat may be accompanied by other symptoms, such as coughing, sneezing, fever, and swollen neck glands. A sore throat is often the first sign of another sickness, such as a cold, flu, strep throat, or mononucleosis (commonly known as mono). Most sore throats go away without medical treatment. CAUSES  The most common causes of a sore throat include:  A viral infection, such as a cold, flu, or mono.  A bacterial infection, such as strep throat, tonsillitis, or whooping cough.  Seasonal allergies.  Dryness in the air.  Irritants, such as smoke or pollution.  Gastroesophageal reflux disease  (GERD). HOME CARE INSTRUCTIONS   Only take over-the-counter medicines as directed by your caregiver.  Drink enough fluids to keep your urine clear or pale yellow.  Rest as needed.  Try using throat sprays, lozenges, or sucking on hard candy to ease any pain (if older than 4 years or as directed).  Sip warm liquids, such as broth, herbal tea, or warm water with honey to relieve pain temporarily. You may also eat or drink cold or frozen liquids such as frozen ice pops.  Gargle with salt water (mix 1 tsp salt with 8 oz of water).  Do not smoke and avoid secondhand smoke.  Put a cool-mist humidifier in your bedroom at night to moisten the air. You can also turn on a hot shower and sit in the bathroom with the door closed for 5-10 minutes. SEEK IMMEDIATE MEDICAL CARE IF:  You have difficulty breathing.  You are unable to swallow fluids, soft foods, or your saliva.  You have increased swelling in the throat.  Your sore throat does not get better in 7 days.  You have nausea and vomiting.  You have a fever or persistent symptoms for more than 2-3 days.  You have a fever and your symptoms suddenly get worse. MAKE SURE YOU:   Understand these instructions.  Will watch your condition.  Will get help right away if you are not doing well or get worse.   This information is not intended to replace advice given to you by your health care provider. Make sure you discuss any questions you have with your health care provider.   Document Released: 11/17/2004 Document Revised: 10/31/2014 Document Reviewed: 06/17/2012 Elsevier Interactive Patient Education [employer]2016 Else[employer] Inc.

## 2015-09-07 NOTE — ED Provider Notes (Signed)
[facility] Emergency Department Provider Note  ____________________________________________  Time seen: Approximately 3:30 AM  I have reviewed the triage vital signs and the nursing notes.   HISTORY  Chief Complaint Hoarse and Cough    HPI Gabriel [name] is a 39 y.o. male who presents to the ED from home with a chief complaint of "I'm not sick, I just lost my voice". 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 Reports clear sinus drainage and nonproductive cough. Denies recent travel or trauma. Denies fever, chills, chest pain, shortness of breath, abdominal pain, nausea, vomiting, diarrhea, rash. Nothing makes his sore throat better. Talking makes his sore throat worse.Patient has a history of HIV, on anti-retroviral medicines. Reports last CD4 count 400, undetectable viral load. States he is not taking prophylactic antibiotics.   Past Medical History  Diagnosis Date  . HIV positive (HCC)   . MRSA infection   . Cellulitis     Patient Active Problem List   Diagnosis Date Noted  . Hematochezia 03/26/2015  . Dermatitis 06/10/2014  . Dyslipidemia 04/14/2011  . Blood in stool 03/20/2009  . 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 DENTAL CARIES 01/02/2009  . TOBACCO USER 05/27/2008  . INSOMNIA, CHRONIC 05/27/2008  . Human immunodeficiency virus (HIV) disease (HCC) 11/28/2006  . SYPHILIS NOS 11/28/2006    Past Surgical History  Procedure Laterality Date  . Hand / finger lesion excision      Current Outpatient Rx  Name  Route  Sig  Dispense  Refill  . darunavir-cobicistat (PREZCOBIX) 800-150 MG per tablet   Oral   Take 1 tablet by mouth daily. Swallow whole. Do NOT crush, break or chew tablets. Take with food.   30 tablet   11   . emtricitabine-tenofovir AF (DESCOVY) 200-25 MG per tablet   Oral   Take 1 tablet by mouth daily.   30 tablet   11   . hydrocortisone 1 %  ointment   Topical   Apply 1 application topically 2 (two) times daily. Patient not taking: Reported on 03/26/2015   30 g   0   . nicotine ([employer] NICOTINE TRANSDERMAL SYS) 14 mg/24hr patch   Transdermal   Place 1 patch (14 mg total) onto the skin daily. Patient not taking: Reported on 01/06/2015   28 patch   0     Allergies Abacavir  Family History  Problem Relation Age of Onset  . Cancer Maternal Grandmother   . Crohn's disease Brother   . Hypertension Mother     Social History Social History  Substance Use Topics  . Smoking status: Current Every Day Smoker -- 1.00 packs/day    Types: Cigarettes  . Smokeless tobacco: Never Used  . Alcohol Use: 1.8 oz/week    3 Standard drinks or equivalent per week    Review of Systems Constitutional: No fever/chills Eyes: No visual changes. ENT: Positive for sore throat. Cardiovascular: Denies chest pain. Respiratory: Denies shortness of breath. Gastrointestinal: No abdominal pain.  No nausea, no vomiting.  No diarrhea.  No constipation. Genitourinary: Negative for dysuria. Musculoskeletal: Negative for back pain. Skin: Negative for rash. Neurological: Negative for headaches, focal weakness or numbness.  10-point ROS otherwise negative.  ____________________________________________   PHYSICAL EXAM:  VITAL SIGNS: ED Triage Vitals  Enc Vitals Group     BP 09/07/15 0307 94/61 mmHg     Pulse Rate 09/07/15 0307 102     Resp 09/07/15 0307 18  Temp 09/07/15 0307 97.5 F (36.4 C)     Temp Source 09/07/15 0307 Oral     SpO2 09/07/15 0307 96 %     Weight 09/07/15 0307 170 lb (77.111 kg)     Height 09/07/15 0307 6\' 6"  (1.981 m)     Head Cir --      Peak Flow --      Pain Score 09/07/15 0310 0     Pain Loc --      Pain Edu? --      Excl. in GC? --     Constitutional: Alert and oriented. Well appearing and in no acute distress. Eyes: Conjunctivae are normal. PERRL. EOMI. Head: Atraumatic. Nose: No  congestion/rhinnorhea. Mouth/Throat: Mucous membranes are moist.  Oropharynx erythematous. There is no tonsillar swelling, exudates or peritonsillar abscess. Voice is hoarse and barely audible. There is no muffled voice. There is no drooling. There are no white plaques suggestive of oral thrush. Neck: No stridor.   Hematological/Lymphatic/Immunilogical: No cervical lymphadenopathy. Cardiovascular: Normal rate, regular rhythm. Grossly normal heart sounds.  Good peripheral circulation. Respiratory: Normal respiratory effort.  No retractions. Lungs CTAB. Gastrointestinal: Soft and nontender. No distention. No abdominal bruits. No CVA tenderness. Musculoskeletal: No lower extremity tenderness nor edema.  No joint effusions. Neurologic:  Normal speech and language. No gross focal neurologic deficits are appreciated. No gait instability. Skin:  Skin is warm, dry and intact. No rash noted. Psychiatric: Mood and affect are normal. Speech and behavior are normal.  ____________________________________________   LABS (all labs ordered are listed, but only abnormal results are displayed)  Labs Reviewed - No data to display ____________________________________________  EKG  None ____________________________________________  RADIOLOGY  None ____________________________________________   PROCEDURES  Procedure(s) performed: None  Critical Care performed: No  ____________________________________________   INITIAL IMPRESSION / ASSESSMENT AND PLAN / ED COURSE  Pertinent labs & imaging results that were available during my care of the patient were reviewed by me and considered in my medical decision making (see chart for details).  39 year old male, HIV+, presenting with sore throat and laryngitis. Will obtain rapid strep test, administer Magic mouthwash and follow-up with his PCP. Patient encouraged to rest his voice. Strict return precautions given. Patient verbalizes understanding and  agrees with plan of care. ____________________________________________   FINAL CLINICAL IMPRESSION(S) / ED DIAGNOSES  Final diagnoses:  Laryngitis  Sore throat      Irean HongJade J Patrice Moates, MD 09/07/15 [id number]

## 2015-09-13 LAB — CULTURE, GROUP A STREP (THRC)

## 2015-11-16 ENCOUNTER — Ambulatory Visit: Payer: Self-pay

## 2015-12-04 ENCOUNTER — Emergency Department
Admission: EM | Admit: 2015-12-04 | Discharge: 2015-12-04 | Disposition: A | Discharge status: Home / Self Care | Payer: No Typology Code available for payment source | Attending: Student | Admitting: Student

## 2015-12-04 ENCOUNTER — Emergency Department: Payer: No Typology Code available for payment source

## 2015-12-04 DIAGNOSIS — M545 Low back pain, unspecified: Secondary | ICD-10-CM

## 2015-12-04 DIAGNOSIS — S3992XA Unspecified injury of lower back, initial encounter: Secondary | ICD-10-CM | POA: Diagnosis not present

## 2015-12-04 DIAGNOSIS — Y92481 Parking lot as the place of occurrence of the external cause: Secondary | ICD-10-CM | POA: Diagnosis not present

## 2015-12-04 DIAGNOSIS — F17210 Nicotine dependence, cigarettes, uncomplicated: Secondary | ICD-10-CM | POA: Insufficient documentation

## 2015-12-04 DIAGNOSIS — Y9389 Activity, other specified: Secondary | ICD-10-CM | POA: Insufficient documentation

## 2015-12-04 DIAGNOSIS — M542 Cervicalgia: Secondary | ICD-10-CM

## 2015-12-04 DIAGNOSIS — Z79899 Other long term (current) drug therapy: Secondary | ICD-10-CM | POA: Insufficient documentation

## 2015-12-04 DIAGNOSIS — M62838 Other muscle spasm: Secondary | ICD-10-CM | POA: Insufficient documentation

## 2015-12-04 DIAGNOSIS — Y998 Other external cause status: Secondary | ICD-10-CM | POA: Diagnosis not present

## 2015-12-04 DIAGNOSIS — S199XXA Unspecified injury of neck, initial encounter: Secondary | ICD-10-CM | POA: Diagnosis present

## 2015-12-04 MED ORDER — IBUPROFEN 800 MG PO TABS: 800.0000 mg | ORAL_TABLET | Freq: Three times a day (TID) | ORAL | Status: DC | PRN

## 2015-12-04 MED ORDER — CYCLOBENZAPRINE HCL 10 MG PO TABS: 10.0000 mg | ORAL_TABLET | Freq: Three times a day (TID) | ORAL | Status: DC | PRN
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
## 2016-04-24 ENCOUNTER — Encounter: Payer: Self-pay | Admitting: Emergency Medicine

## 2016-04-24 ENCOUNTER — Emergency Department
Admission: EM | Admit: 2016-04-24 | Discharge: 2016-04-24 | Disposition: A | Discharge status: Home / Self Care | Payer: Self-pay | Attending: Emergency Medicine | Admitting: Emergency Medicine

## 2016-04-24 ENCOUNTER — Emergency Department: Payer: Self-pay

## 2016-04-24 DIAGNOSIS — A039 Shigellosis, unspecified: Secondary | ICD-10-CM | POA: Insufficient documentation

## 2016-04-24 DIAGNOSIS — F1490 Cocaine use, unspecified, uncomplicated: Secondary | ICD-10-CM | POA: Insufficient documentation

## 2016-04-24 DIAGNOSIS — Z79899 Other long term (current) drug therapy: Secondary | ICD-10-CM | POA: Insufficient documentation

## 2016-04-24 DIAGNOSIS — A09 Infectious gastroenteritis and colitis, unspecified: Secondary | ICD-10-CM | POA: Insufficient documentation

## 2016-04-24 DIAGNOSIS — B20 Human immunodeficiency virus [HIV] disease: Secondary | ICD-10-CM | POA: Insufficient documentation

## 2016-04-24 DIAGNOSIS — E785 Hyperlipidemia, unspecified: Secondary | ICD-10-CM | POA: Insufficient documentation

## 2016-04-24 DIAGNOSIS — F17210 Nicotine dependence, cigarettes, uncomplicated: Secondary | ICD-10-CM | POA: Insufficient documentation

## 2016-04-24 LAB — CBC WITH DIFFERENTIAL/PLATELET
Basophils Absolute: 0 10*3/uL (ref 0–0.1)
Basophils Relative: 0 %
EOS ABS: 0 10*3/uL (ref 0–0.7)
Eosinophils Relative: 0 %
HEMATOCRIT: 38.6 % — AB (ref 40.0–52.0)
HEMOGLOBIN: 13.5 g/dL (ref 13.0–18.0)
LYMPHS ABS: 1.1 10*3/uL (ref 1.0–3.6)
LYMPHS PCT: 14 %
MCH: 32.7 pg (ref 26.0–34.0)
MCHC: 34.8 g/dL (ref 32.0–36.0)
MCV: 93.8 fL (ref 80.0–100.0)
MONOS PCT: 9 %
Monocytes Absolute: 0.8 10*3/uL (ref 0.2–1.0)
NEUTROS PCT: 77 %
Neutro Abs: 6.5 10*3/uL (ref 1.4–6.5)
Platelets: 174 10*3/uL (ref 150–440)
RBC: 4.12 MIL/uL — AB (ref 4.40–5.90)
RDW: 13.6 % (ref 11.5–14.5)
WBC: 8.5 10*3/uL (ref 3.8–10.6)

## 2016-04-24 LAB — COMPREHENSIVE METABOLIC PANEL
ALBUMIN: 3.9 g/dL (ref 3.5–5.0)
ALK PHOS: 58 U/L (ref 38–126)
ALT: 17 U/L (ref 17–63)
ANION GAP: 9 (ref 5–15)
AST: 25 U/L (ref 15–41)
BUN: 10 mg/dL (ref 6–20)
CALCIUM: 8.4 mg/dL — AB (ref 8.9–10.3)
CHLORIDE: 99 mmol/L — AB (ref 101–111)
CO2: 26 mmol/L (ref 22–32)
Creatinine, Ser: 1.07 mg/dL (ref 0.61–1.24)
GFR calc non Af Amer: >60 mL/min (ref >60)
GLUCOSE: 130 mg/dL — AB (ref 65–99)
Potassium: 3.1 mmol/L — ABNORMAL LOW (ref 3.5–5.1)
SODIUM: 134 mmol/L — AB (ref 135–145)
Total Bilirubin: 1 mg/dL (ref 0.3–1.2)
Total Protein: 7.6 g/dL (ref 6.5–8.1)

## 2016-04-24 LAB — LIPASE, BLOOD: Lipase: 17 U/L (ref 11–51)

## 2016-04-24 LAB — GASTROINTESTINAL PANEL BY PCR, STOOL (REPLACES STOOL CULTURE)
Adenovirus F40/41: NOT DETECTED
Astrovirus: NOT DETECTED
CAMPYLOBACTER SPECIES: NOT DETECTED
CRYPTOSPORIDIUM: NOT DETECTED
Cyclospora cayetanensis: NOT DETECTED
E. coli O157: NOT DETECTED
ENTEROAGGREGATIVE E COLI (EAEC): NOT DETECTED
ENTEROPATHOGENIC E COLI (EPEC): NOT DETECTED
ENTEROTOXIGENIC E COLI (ETEC): NOT DETECTED
Entamoeba histolytica: NOT DETECTED
GIARDIA LAMBLIA: NOT DETECTED
NOROVIRUS GI/GII: NOT DETECTED
PLESIMONAS SHIGELLOIDES: NOT DETECTED
ROTAVIRUS A: NOT DETECTED
SALMONELLA SPECIES: NOT DETECTED
SHIGA LIKE TOXIN PRODUCING E COLI (STEC): NOT DETECTED
SHIGELLA/ENTEROINVASIVE E COLI (EIEC): DETECTED — AB
Sapovirus (I, II, IV, and V): NOT DETECTED
Vibrio cholerae: NOT DETECTED
Vibrio species: NOT DETECTED
Yersinia enterocolitica: NOT DETECTED

## 2016-04-24 LAB — URINALYSIS COMPLETE WITH MICROSCOPIC (ARMC ONLY)
BACTERIA UA: NONE SEEN
Bilirubin Urine: NEGATIVE
GLUCOSE, UA: NEGATIVE mg/dL
Hgb urine dipstick: NEGATIVE
Ketones, ur: NEGATIVE mg/dL
Leukocytes, UA: NEGATIVE
Nitrite: NEGATIVE
PROTEIN: NEGATIVE mg/dL
RBC / HPF: NONE SEEN RBC/hpf (ref 0–5)
SPECIFIC GRAVITY, URINE: 1.003 — AB (ref 1.005–1.030)
pH: 7 (ref 5.0–8.0)

## 2016-04-24 MED ORDER — ONDANSETRON HCL 4 MG/2ML IJ SOLN
4.0000 mg | Freq: Once | INTRAMUSCULAR | Status: AC
  Administered 2016-04-24: 4 mg via INTRAVENOUS
  Filled 2016-04-24: qty 2

## 2016-04-24 MED ORDER — IOPAMIDOL (ISOVUE-300) INJECTION 61%
100.0000 mL | Freq: Once | INTRAVENOUS | Status: AC | PRN
  Administered 2016-04-24: 100 mL via INTRAVENOUS

## 2016-04-24 MED ORDER — OXYCODONE-ACETAMINOPHEN 5-325 MG PO TABS: 1.0000 | ORAL_TABLET | Freq: Four times a day (QID) | ORAL | Status: DC | PRN

## 2016-04-24 MED ORDER — SODIUM CHLORIDE 0.9 % IV BOLUS (SEPSIS)
1000.0000 mL | Freq: Once | INTRAVENOUS | Status: AC
  Administered 2016-04-24: 1000 mL via INTRAVENOUS

## 2016-04-24 MED ORDER — HYDROMORPHONE HCL 1 MG/ML IJ SOLN
1.0000 mg | Freq: Once | INTRAMUSCULAR | Status: AC
  Administered 2016-04-24: 1 mg via INTRAVENOUS
  Filled 2016-04-24: qty 1

## 2016-04-24 MED ORDER — ONDANSETRON HCL 4 MG PO TABS: 4.0000 mg | ORAL_TABLET | Freq: Four times a day (QID) | ORAL | Status: DC | PRN

## 2016-04-24 MED ORDER — CIPROFLOXACIN HCL 500 MG PO TABS: 500.0000 mg | ORAL_TABLET | Freq: Two times a day (BID) | ORAL | Status: DC

## 2016-04-24 MED ORDER — DIATRIZOATE MEGLUMINE & SODIUM 66-10 % PO SOLN
15.0000 mL | Freq: Once | ORAL | Status: AC
  Administered 2016-04-24: 15 mL via ORAL

## 2016-04-24 NOTE — ED Notes (Signed)
Pt. Verbalizes understanding of d/c instructions, prescriptions, and follow-up. VS stable.  Pt. In NAD at time of d/c and denies concerns. Pt. Out of the unit by wheelchair with RN

## 2016-04-24 NOTE — Discharge Instructions (Signed)
Antibiotic Medicine Antibiotic medicines are used to treat infections caused by bacteria. They work by injuring or killing the bacteria that is making you sick. HOW IS AN ANTIBIOTIC CHOSEN? An antibiotic is chosen based on many factors. To help your health care provider choose one for you, tell your health care provider if:  You have any allergies.  You are pregnant or plan to get pregnant.  You are breastfeeding.  You are taking any medicines. These include over-the-counter medicines, prescription medicines, and herbal remedies.  You have a medical condition or problem you have not already discussed. Your health care provider will also consider:  How often the medicine has to be taken.  Common side effects of the medicine.  The cost of the medicine.  The taste of the medicine. If you have questions about why an antibiotic was chosen, make sure to ask. FOR HOW LONG SHOULD I TAKE MY ANTIBIOTIC? Continue to take your antibiotic for as long as told by your health care provider. Do not stop taking it when you feel better. If you stop taking it too soon:  You may start to feel sick again.  Your infection may become harder to treat.  Complications may develop. WHAT IF I MISS A DOSE? Try not to miss any doses of medicine. If you miss a dose, take it as soon as possible. However, if it is almost time for the next dose:  If you are taking 2 doses per day, take the missed dose and the next dose 5 to 6 hours apart.  If you are taking 3 or more doses per day, take the missed dose and the next dose 2 to 4 hours apart, then go back to the normal schedule. If you cannot make up a missed dose, take the next scheduled dose on time. Then take the missed dose after you have taken all the doses as recommended by your health care provider, as if you had one more dose left. DO ANTIBIOTICS AFFECT BIRTH CONTROL? Birth control pills may not work while you are on antibiotics. If you are taking birth  control pills, continue taking them as usual and use a second form of birth control, such as a condom, to avoid unwanted pregnancy. Continue using the second form of birth control until you are finished with your current 1 month cycle of birth control pills. OTHER INFORMATION  If there is any medicine left over, throw it away.  Never take someone else's antibiotics.  Never take leftover antibiotics. SEEK MEDICAL CARE IF:  You get worse.  You do not feel better within a few days of starting the antibiotic medicine.  You vomit.  White patches appear in your mouth.  You have new joint pain that begins after starting the antibiotic.  You have new muscle aches that begin after starting the antibiotic.  You had a fever before starting the antibiotic and it returns.  You have any symptoms of an allergic reaction, such as an itchy rash. If this happens, stop taking the antibiotic. SEEK IMMEDIATE MEDICAL CARE IF:  Your urine turns dark or becomes blood-colored.  Your skin turns yellow.  You bruise or bleed easily.  You have severe diarrhea and abdominal cramps.  You have a severe headache.  You have signs of a severe allergic reaction, such as:  Trouble breathing.  Wheezing.  Swelling of the lips, tongue, or face.  Fainting.  Blisters on the skin or in the mouth. If you have signs of a severe allergic  reaction, stop taking the antibiotic right away.   This information is not intended to replace advice given to you by your health care provider. Make sure you discuss any questions you have with your health care provider.   Document Released: 06/22/2004 Document Revised: 07/01/2015 Document Reviewed: 02/25/2015 Elsevier Interactive Patient Education 2016 [employer].  Diarrhea Diarrhea is frequent loose and watery bowel movements. It can cause you to feel weak and dehydrated. Dehydration can cause you to become tired and thirsty, have a dry mouth, and have decreased  urination that often is dark yellow. Diarrhea is a sign of another problem, most often an infection that will not last long. In most cases, diarrhea typically lasts 2-3 days. However, it can last longer if it is a sign of something more serious. It is important to treat your diarrhea as directed by your caregiver to lessen or prevent future episodes of diarrhea. CAUSES  Some common causes include:  Gastrointestinal infections caused by viruses, bacteria, or parasites.  Food poisoning or food allergies.  Certain medicines, such as antibiotics, chemotherapy, and laxatives.  Artificial sweeteners and fructose.  Digestive disorders. HOME CARE INSTRUCTIONS  Ensure adequate fluid intake (hydration): Have 1 cup (8 oz) of fluid for each diarrhea episode. Avoid fluids that contain simple sugars or sports drinks, fruit juices, whole milk products, and sodas. Your urine should be clear or pale yellow if you are drinking enough fluids. Hydrate with an oral rehydration solution that you can purchase at pharmacies, retail stores, and online. You can prepare an oral rehydration solution at home by mixing the following ingredients together:   - tsp table salt.   tsp baking soda.   tsp salt substitute containing potassium chloride.  1  tablespoons sugar.  1 L (34 oz) of water.  Certain foods and beverages may increase the speed at which food moves through the gastrointestinal (GI) tract. These foods and beverages should be avoided and include:  Caffeinated and alcoholic beverages.  High-fiber foods, such as raw fruits and vegetables, nuts, seeds, and whole grain breads and cereals.  Foods and beverages sweetened with sugar alcohols, such as xylitol, sorbitol, and mannitol.  Some foods may be well tolerated and may help thicken stool including:  Starchy foods, such as rice, toast, pasta, low-sugar cereal, oatmeal, grits, baked potatoes, crackers, and bagels.  Bananas.  Applesauce.  Add  probiotic-rich foods to help increase healthy bacteria in the GI tract, such as yogurt and fermented milk products.  Wash your hands well after each diarrhea episode.  Only take over-the-counter or prescription medicines as directed by your caregiver.  Take a warm bath to relieve any burning or pain from frequent diarrhea episodes. SEEK IMMEDIATE MEDICAL CARE IF:   You are unable to keep fluids down.  You have persistent vomiting.  You have blood in your stool, or your stools are black and tarry.  You do not urinate in 6-8 hours, or there is only a small amount of very dark urine.  You have abdominal pain that increases or localizes.  You have weakness, dizziness, confusion, or light-headedness.  You have a severe headache.  Your diarrhea gets worse or does not get better.  You have a fever or persistent symptoms for more than 2-3 days.  You have a fever and your symptoms suddenly get worse. MAKE SURE YOU:   Understand these instructions.  Will watch your condition.  Will get help right away if you are not doing well or get worse.   This  information is not intended to replace advice given to you by your health care provider. Make sure you discuss any questions you have with your health care provider.   Document Released: 09/30/2002 Document Revised: 10/31/2014 Document Reviewed: 06/17/2012 Elsevier Interactive Patient Education 2016 Elsevier Inc.  Colitis Colitis is inflammation of the colon. Colitis may last a short time (acute) or it may last a long time (chronic). CAUSES This condition may be caused by:  Viruses.  Bacteria.  Reactions to medicine.  Certain autoimmune diseases, such as Crohn disease or ulcerative colitis. SYMPTOMS Symptoms of this condition include:  Diarrhea.  Passing bloody or tarry stool.  Pain.  Fever.  Vomiting.  Tiredness (fatigue).  Weight loss.  Bloating.  Sudden increase in abdominal pain.  Having fewer bowel  movements than usual. DIAGNOSIS This condition is diagnosed with a stool test or a blood test. You may also have other tests, including X-rays, a CT scan, or a colonoscopy. TREATMENT Treatment may include:  Resting the bowel. This involves not eating or drinking for a period of time.  Fluids that are given through an IV tube.  Medicine for pain and diarrhea.  Antibiotic medicines.  Cortisone medicines.  Surgery. HOME CARE INSTRUCTIONS Eating and Drinking  Follow instructions from your health care provider about eating or drinking restrictions.  Drink enough fluid to keep your urine clear or pale yellow.  Work with a dietitian to determine which foods cause your condition to flare up.  Avoid foods that cause flare-ups.  Eat a well-balanced diet. Medicines  Take over-the-counter and prescription medicines only as told by your health care provider.  If you were prescribed an antibiotic medicine, take it as told by your health care provider. Do not stop taking the antibiotic even if you start to feel better. General Instructions  Keep all follow-up visits as told by your health care provider. This is important. SEEK MEDICAL CARE IF:  Your symptoms do not go away.  You develop new symptoms. SEEK IMMEDIATE MEDICAL CARE IF:  You have a fever that does not go away with treatment.  You develop chills.  You have extreme weakness, fainting, or dehydration.  You have repeated vomiting.  You develop severe pain in your abdomen.  You pass bloody or tarry stool.   This information is not intended to replace advice given to you by your health care provider. Make sure you discuss any questions you have with your health care provider.   Document Released: 11/17/2004 Document Revised: 07/01/2015 Document Reviewed: 02/02/2015 Elsevier Interactive Patient Education 2016 Elsevier Inc.  Shigella Infection, Adult Shigella infection occurs when certain bacteria infect the  intestines. Symptoms usually start between 2 days and 4 days after ingestion of the bacteria, but they may begin as late as 1 week after ingestion. The illness usually lasts from 5 days to 7 days. Shigella infection can spread from person to person (contagious). Most people recover completely. In rare cases, lasting problems may develop, such as arthritis, kidney problems, or abnormal blood counts. CAUSES  The bacteria that cause shigella infection are found in the stool of infected people. You can become infected by:  Eating food or drinking liquids that are contaminated with the bacteria.  Touching surfaces or objects contaminated with the bacteria and then placing your hand in your mouth.  Having direct contact with a person who is infected. This may occur while caring for someone with illness or while sharing foods or eating utensils with someone who is ill.  Swimming  in contaminated water. SYMPTOMS   Diarrhea, commonly with blood, mucus, or pus.  Abdominal pain or cramps.  Fever.  Nausea.  Vomiting.  Loss of appetite.  Rectal spasms (tenesmus).  Rectum protruding out of the body (rectal prolapse). DIAGNOSIS  Your caregiver will take your history and perform a physical exam. A stool sample may also be taken and tested for the presence of Shigella bacteria. TREATMENT  Often, no treatment is needed. However, you will need to drink plenty of fluids to prevent dehydration. Preventing and treating dehydration is important because severe dehydration can cause serious problems. In severe cases, antibiotic medicines may be given to help shorten the illness and to prevent others from being infected. Antidiarrheal medicines are not recommended. They can make your condition worse. HOME CARE INSTRUCTIONS  Wash your hands well to avoid spreading the bacteria.  Do not prepare food if you have diarrhea.  If you are given antibiotics, take them as directed. Finish them even if you start to  feel better.  Only take over-the-counter or prescription medicines for pain, discomfort, or fever as directed by your caregiver.  Drink enough fluids to keep your urine clear or pale yellow. Until your diarrhea, nausea, or vomiting is under control, you should only drink clear liquids. Clear liquids are anything you can see through, such as water, broth, or non-caffeinated tea. Avoid:  Milk.  Fruit juice.  Alcohol.  Extremely hot or cold fluids.  If you do not have an appetite, do not force yourself to eat. However, you must continue to drink fluids.  If you have an appetite, eat a normal diet unless your caregiver tells you differently.  Eat a variety of complex carbohydrates (rice, wheat, potatoes, bread), lean meats, yogurt, fruits, and vegetables.  Avoid high-fat foods because they are more difficult to digest.  If you are dehydrated, ask your caregiver for specific rehydration instructions. Signs of dehydration may include:  Severe thirst.  Dry lips and mouth.  Dizziness.  Dark urine.  Decreasing urine frequency and amount.  Confusion.  Rapid breathing or pulse.  Keep all follow-up appointments as directed by your caregiver. SEEK IMMEDIATE MEDICAL CARE IF:   You are unable to keep fluids down.  You have persistent vomiting or diarrhea.  You have abdominal pain that increases or is concentrated in one small area (localized).  Your diarrhea contains increased blood or mucus.  You feel very weak, dizzy, thirsty, or you faint.  You lose a significant amount of weight. Your caregiver can tell you how much weight loss should concern you.  You have a fever.  You feel confused. MAKE SURE YOU:   Understand these instructions.  Will watch your condition.  Will get help right away if you are not doing well or get worse.   This information is not intended to replace advice given to you by your health care provider. Make sure you discuss any questions you have  with your health care provider.   Document Released: 10/07/2000 Document Revised: 10/31/2014 Document Reviewed: 12/08/2011 Elsevier Interactive Patient Education [employer] Elsevier Inc.

## 2016-04-24 NOTE — ED Provider Notes (Signed)
[facility] Emergency Department Provider Note  ____________________________________________  Time seen: 10:15 AM  I have reviewed the triage vital signs and the nursing notes.   HISTORY  Chief Complaint Abdominal Pain and Diarrhea    HPI Gabriel [name] is a 40 y.o. male who complains of diffuse abdominal cramping with bloody diarrhea started yesterday. Denies any travel trauma, no recent antibiotics. No uncooked meat or drinking untreated water or camping or other outdoor activities. Then following his usual lifestyle. He does have HIV which is well-controlled on antiretroviral therapy. He reports that at previous checks his CD4 count and viral load have been at goal. Pain is colicky, severe in intensity, nonradiating. No aggravating or alleviating factors.    Past Medical History  Diagnosis Date  . HIV positive (HCC)   . MRSA infection   . Cellulitis      Patient Active Problem List   Diagnosis Date Noted  . Hematochezia 03/26/2015  . Dermatitis 06/10/2014  . Dyslipidemia 04/14/2011  . Blood in stool 03/20/2009  . 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 DENTAL CARIES 01/02/2009  . TOBACCO USER 05/27/2008  . INSOMNIA, CHRONIC 05/27/2008  . Human immunodeficiency virus (HIV) disease (HCC) 11/28/2006  . SYPHILIS NOS 11/28/2006     Past Surgical History  Procedure Laterality Date  . Hand / finger lesion excision       Current Outpatient Rx  Name  Route  Sig  Dispense  Refill  . ciprofloxacin (CIPRO) 500 MG tablet   Oral   Take 1 tablet (500 mg total) by mouth 2 (two) times daily.   6 tablet   0   . cyclobenzaprine (FLEXERIL) 10 MG tablet   Oral   Take 1 tablet (10 mg total) by mouth 3 (three) times daily as needed for muscle spasms.   21 tablet   0   . darunavir-cobicistat (PREZCOBIX) 800-150 MG per tablet   Oral   Take 1 tablet by mouth daily. Swallow whole. Do NOT crush, break or chew tablets. Take with  food.   30 tablet   11   . emtricitabine-tenofovir AF (DESCOVY) 200-25 MG per tablet   Oral   Take 1 tablet by mouth daily.   30 tablet   11   . hydrocortisone 1 % ointment   Topical   Apply 1 application topically 2 (two) times daily. Patient not taking: Reported on 03/26/2015   30 g   0   . ibuprofen (ADVIL,MOTRIN) 800 MG tablet   Oral   Take 1 tablet (800 mg total) by mouth every 8 (eight) hours as needed (pain).   60 tablet   0   . magic mouthwash SOLN   Oral   Take 10 mLs by mouth 3 (three) times daily as needed for mouth pain.   120 mL   0   . nicotine ([employer] NICOTINE TRANSDERMAL SYS) 14 mg/24hr patch   Transdermal   Place 1 patch (14 mg total) onto the skin daily. Patient not taking: Reported on 01/06/2015   28 patch   0   . ondansetron (ZOFRAN) 4 MG tablet   Oral   Take 1 tablet (4 mg total) by mouth every 6 (six) hours as needed for nausea or vomiting.   20 tablet   1   . oxyCODONE-acetaminophen (ROXICET) 5-325 MG tablet   Oral   Take 1 tablet by mouth every 6 (six) hours as needed for severe pain.   12 tablet   0  Allergies Abacavir   Family History  Problem Relation Age of Onset  . Cancer Maternal Grandmother   . Crohn's disease Brother   . Hypertension Mother     Social History Social History  Substance Use Topics  . Smoking status: Current Every Day Smoker -- 1.00 packs/day    Types: Cigarettes  . Smokeless tobacco: Never Used  . Alcohol Use: 1.8 oz/week    3 Standard drinks or equivalent per week    Review of Systems  Constitutional:   No fever or chills.  ENT:   No sore throat. No rhinorrhea. Cardiovascular:   No chest pain. Respiratory:   No dyspnea or cough. Gastrointestinal:   Hughes abdominal pain. No vomiting. Positive diarrhea.  Genitourinary:   Negative for dysuria or difficulty urinating. Musculoskeletal:   Negative for focal pain or swelling Neurological:   Negative for headaches 10-point ROS otherwise  negative.  ____________________________________________   PHYSICAL EXAM:  VITAL SIGNS: ED Triage Vitals  Enc Vitals Group     BP 04/24/16 0959 93/57 mmHg     Pulse Rate 04/24/16 0958 114     Resp 04/24/16 0958 20     Temp 04/24/16 0958 98.3 F (36.8 C)     Temp Source 04/24/16 0958 Oral     SpO2 04/24/16 0958 98 %     Weight 04/24/16 0958 180 lb (81.647 kg)     Height 04/24/16 0958 6\' 6"  (1.981 m)     Head Cir --      Peak Flow --      Pain Score 04/24/16 0959 0     Pain Loc --      Pain Edu? --      Excl. in GC? --     Vital signs reviewed, nursing assessments reviewed.   Constitutional:   Alert and oriented. Well appearing and in no distress. Eyes:   No scleral icterus. No conjunctival pallor. PERRL. EOMI.  No nystagmus. ENT   Head:   Normocephalic and atraumatic.   Nose:   No congestion/rhinnorhea. No septal hematoma   Mouth/Throat:   MMM, no pharyngeal erythema. No peritonsillar mass.    Neck:   No stridor. No SubQ emphysema. No meningismus. Hematological/Lymphatic/Immunilogical:   No cervical lymphadenopathy. Cardiovascular:   RRR. Symmetric bilateral radial and DP pulses.  No murmurs.  Respiratory:   Normal respiratory effort without tachypnea nor retractions. Breath sounds are clear and equal bilaterally. No wheezes/rales/rhonchi. Gastrointestinal:   Soft with generalized tenderness and guarding. Non distended. There is no CVA tenderness. Stool sample provided by patient is watery and grossly bloody. Genitourinary:   deferred Musculoskeletal:   Nontender with normal range of motion in all extremities. No joint effusions.  No lower extremity tenderness.  No edema. Neurologic:   Normal speech and language.  CN 2-10 normal. Motor grossly intact. No gross focal neurologic deficits are appreciated.  Skin:    Skin is warm, dry and intact. No rash noted.  No petechiae, purpura, or bullae.  ____________________________________________    LABS (pertinent  positives/negatives) (all labs ordered are listed, but only abnormal results are displayed) Labs Reviewed  GASTROINTESTINAL PANEL BY PCR, STOOL (REPLACES STOOL CULTURE) - Abnormal; Notable for the following:    Shigella/Enteroinvasive E coli (EIEC) DETECTED (*)    All other components within normal limits  COMPREHENSIVE METABOLIC PANEL - Abnormal; Notable for the following:    Sodium 134 (*)    Potassium 3.1 (*)    Chloride 99 (*)    Glucose, Bld 130 (*)  Calcium 8.4 (*)    All other components within normal limits  CBC WITH DIFFERENTIAL/PLATELET - Abnormal; Notable for the following:    RBC 4.12 (*)    HCT 38.6 (*)    All other components within normal limits  URINALYSIS COMPLETEWITH MICROSCOPIC ([facility] ONLY) - Abnormal; Notable for the following:    Color, Urine STRAW (*)    APPearance CLEAR (*)    Specific Gravity, Urine 1.003 (*)    Squamous Epithelial / LPF 0-5 (*)    All other components within normal limits  STOOL CULTURE  LIPASE, BLOOD   ____________________________________________   EKG    ____________________________________________    RADIOLOGY  CT abdomen and pelvis reveals inflammation consistent with mild colitis of the descending and rectosigmoid colon  ____________________________________________   PROCEDURES   ____________________________________________   INITIAL IMPRESSION / ASSESSMENT AND PLAN / ED COURSE  Pertinent labs & imaging results that were available during my care of the patient were reviewed by me and considered in my medical decision making (see chart for details).  Patient presents with generalized abdominal pain. Exam is concerning for peritonitis. CT consistent with descending colitis, and stool panel is consistent which shigella.  Discussed with pharmacy who advises that there is no increased resistance of fluoroquinolones to shigella in our community that we know of, so a stool culture was added, but I'll start the patient on  Cipro for 3 days. I'll give Zofran and Percocet for use as needed to manage symptoms in the meantime.  I have reviewed the prior 12 month history of controlled substance prescriptions obtained by this patient in the [place] Controlled Substance Reporting System prior to prescribing any controlled substances.  Patient is ambulatory without orthostatic symptoms. He is tolerating oral intake, vital signs are unremarkable.     ____________________________________________   FINAL CLINICAL IMPRESSION(S) / ED DIAGNOSES  Final diagnoses:  Infectious colitis  Infection, Shigella       Portions of this note were generated with dragon dictation software. Dictation errors may occur despite best attempts at proofreading.   Sharman Cheek, MD 04/24/16 [phone]

## 2016-04-24 NOTE — ED Notes (Signed)
Abdominal cramping, diarrhea and frequency in urination.  Onset of symptoms yesterday.  Denies N/V

## 2016-07-30 ENCOUNTER — Emergency Department (HOSPITAL_COMMUNITY): Admission: EM | Admit: 2016-07-30 | Discharge: 2016-07-30 | Discharge status: Against Medical Advice | Payer: Self-pay

## 2016-07-30 NOTE — ED Notes (Signed)
Unable to locate for triage x3 

## 2016-09-06 ENCOUNTER — Encounter: Payer: Self-pay | Admitting: Emergency Medicine

## 2016-09-06 ENCOUNTER — Emergency Department
Admission: EM | Admit: 2016-09-06 | Discharge: 2016-09-06 | Disposition: A | Discharge status: Home / Self Care | Payer: Self-pay | Attending: Emergency Medicine | Admitting: Emergency Medicine

## 2016-09-06 DIAGNOSIS — Z21 Asymptomatic human immunodeficiency virus [HIV] infection status: Secondary | ICD-10-CM | POA: Insufficient documentation

## 2016-09-06 DIAGNOSIS — K0889 Other specified disorders of teeth and supporting structures: Secondary | ICD-10-CM

## 2016-09-06 DIAGNOSIS — Z791 Long term (current) use of non-steroidal anti-inflammatories (NSAID): Secondary | ICD-10-CM | POA: Insufficient documentation

## 2016-09-06 DIAGNOSIS — K029 Dental caries, unspecified: Secondary | ICD-10-CM | POA: Insufficient documentation

## 2016-09-06 DIAGNOSIS — F17210 Nicotine dependence, cigarettes, uncomplicated: Secondary | ICD-10-CM | POA: Insufficient documentation

## 2016-09-06 DIAGNOSIS — Z79899 Other long term (current) drug therapy: Secondary | ICD-10-CM | POA: Insufficient documentation

## 2016-09-06 MED ORDER — LIDOCAINE-EPINEPHRINE 2 %-1:100000 IJ SOLN
1.7000 mL | Freq: Once | INTRAMUSCULAR | Status: DC
  Filled 2016-09-06: qty 1.7

## 2016-09-06 MED ORDER — KETOROLAC TROMETHAMINE 30 MG/ML IJ SOLN
30.0000 mg | Freq: Once | INTRAMUSCULAR | Status: AC
  Administered 2016-09-06: 30 mg via INTRAMUSCULAR
  Filled 2016-09-06: qty 1

## 2016-09-06 NOTE — ED Triage Notes (Signed)
Pt c/o pain to upper back right molar. [facility], would like something for pain for night.

## 2016-09-06 NOTE — ED Provider Notes (Signed)
[facility] Emergency Department Provider Note  ____________________________________________  Time seen: Approximately 5:07 PM  I have reviewed the triage vital signs and the nursing notes.   HISTORY  Chief Complaint Dental Pain    HPI Gabriel [name] is a 40 y.o. male, NAD, who presents to the emergency department with 2 day history of dental pain. Patient reports the pain has been intermittent for several weeks, but has progressively gotten worse over the past 2 days and is now a constant 10/10 pain. He reports he pain is worse with eating and drinking, but he has not tried anything to alleviate the pain. He denies any fevers, chills, swelling, redness, bloody or purulent drainage. He states that he is trying to have the [facility] tomorrow morning, but "needed something for the pain tonight". Denies injury or trauma to the face or neck.   Past Medical History:  Diagnosis Date  . Cellulitis   . HIV positive (HCC)   . MRSA infection     Patient Active Problem List   Diagnosis Date Noted  . Hematochezia 03/26/2015  . Dermatitis 06/10/2014  . Dyslipidemia 04/14/2011  . Blood in stool 03/20/2009  . 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 DENTAL CARIES 01/02/2009  . TOBACCO USER 05/27/2008  . INSOMNIA, CHRONIC 05/27/2008  . Human immunodeficiency virus (HIV) disease (HCC) 11/28/2006  . SYPHILIS NOS 11/28/2006    Past Surgical History:  Procedure Laterality Date  . HAND / FINGER LESION EXCISION      Prior to Admission medications   Medication Sig Start Date End Date Taking? Authorizing Provider  ciprofloxacin (CIPRO) 500 MG tablet Take 1 tablet (500 mg total) by mouth 2 (two) times daily. 04/24/16   Sharman CheekPhillip Stafford, MD  cyclobenzaprine (FLEXERIL) 10 MG tablet Take 1 tablet (10 mg total) by mouth 3 (three) times daily as needed for muscle spasms. 12/04/15   Jami L Hagler, PA-C  darunavir-cobicistat (PREZCOBIX) 800-150 MG per  tablet Take 1 tablet by mouth daily. Swallow whole. Do NOT crush, break or chew tablets. Take with food. 01/06/15   Cliffton AstersJohn Campbell, MD  emtricitabine-tenofovir AF (DESCOVY) 200-25 MG per tablet Take 1 tablet by mouth daily. 05/13/15   Cliffton AstersJohn Campbell, MD  hydrocortisone 1 % ointment Apply 1 application topically 2 (two) times daily. Patient not taking: Reported on 03/26/2015 06/10/14   Cliffton AstersJohn Campbell, MD  ibuprofen (ADVIL,MOTRIN) 800 MG tablet Take 1 tablet (800 mg total) by mouth every 8 (eight) hours as needed (pain). 12/04/15   Jami L Hagler, PA-C  magic mouthwash SOLN Take 10 mLs by mouth 3 (three) times daily as needed for mouth pain. 09/07/15   Irean HongJade J Sung, MD  nicotine ([employer] NICOTINE TRANSDERMAL SYS) 14 mg/24hr patch Place 1 patch (14 mg total) onto the skin daily. Patient not taking: Reported on 01/06/2015 11/19/13   Ginnie SmartJeffrey C Hatcher, MD  ondansetron (ZOFRAN) 4 MG tablet Take 1 tablet (4 mg total) by mouth every 6 (six) hours as needed for nausea or vomiting. 04/24/16   Sharman CheekPhillip Stafford, MD  oxyCODONE-acetaminophen (ROXICET) 5-325 MG tablet Take 1 tablet by mouth every 6 (six) hours as needed for severe pain. 04/24/16   Sharman CheekPhillip Stafford, MD    Allergies Abacavir  Family History  Problem Relation Age of Onset  . Hypertension Mother   . Cancer Maternal Grandmother   . Crohn's disease Brother     Social History Social History  Substance Use Topics  . Smoking status: Current Every Day Smoker  Packs/day: 1.00    Types: Cigarettes  . Smokeless tobacco: Never Used  . Alcohol use 1.8 oz/week    3 Standard drinks or equivalent per week     Review of Systems  Constitutional: No fever/chills ENT: Positive for right upper tooth pain. No bloody or purulent drainage.  Musculoskeletal: Negative for Jaw pain.  Skin: Negative for rash, redness, swelling, skin sores. Neurological:Positive for headache  ____________________________________________   PHYSICAL EXAM:  VITAL SIGNS: ED Triage Vitals   Enc Vitals Group     BP 09/06/16 1656 118/81     Pulse Rate 09/06/16 1656 72     Resp 09/06/16 1656 18     Temp 09/06/16 1656 98.5 F (36.9 C)     Temp Source 09/06/16 1656 Oral     SpO2 09/06/16 1656 100 %     Weight 09/06/16 1656 185 lb (83.9 kg)     Height 09/06/16 1656 6\' 6"  (1.981 m)     Head Circumference --      Peak Flow --      Pain Score 09/06/16 1706 10     Pain Loc --      Pain Edu? --      Excl. in GC? --      Constitutional: Alert and oriented. Well appearing and in no acute distress. Eyes: Conjunctivae are normal. Head: Atraumatic. ENT:      Mouth/Throat: Mucous membranes are moist. Patient with right upper dental pain. Poor dental hygiene with carries and multiple extractions and dental work noted Throughout. Back right molar with decay and cavity noted. No erythema, swelling, or drainage noted about the gumline. Hematological/Lymphatic/Immunilogical: No cervical lymphadenopathy. Musculoskeletal:  No tenderness to palpation about the jawline or TMJ. No popping or locking with opening or closing of the mouth. Neurologic:  Normal speech and language. No gross focal neurologic deficits are appreciated.  Skin:  Skin is warm, dry and intact. No rash noted. Psychiatric: Mood and affect are normal. Speech and behavior are normal. Patient exhibits appropriate insight and judgement.   ____________________________________________   LABS  None ____________________________________________  EKG  None ____________________________________________  RADIOLOGY  None ____________________________________________    PROCEDURES  Procedure(s) performed: None   .Nerve Block Date/Time: 09/06/2016 7:17 PM Performed by: Tye SavoyHAGLER, JAMI L Authorized by: Hope PigeonHAGLER, JAMI L   Consent:    Consent obtained:  Verbal   Consent given by:  Patient   Risks discussed:  Allergic reaction, infection and unsuccessful block   Alternatives discussed:  No treatment Indications:     Indications:  Pain relief Location:    Body area:  Head   Head nerve blocked: inferior alveolar nerve block.   Laterality:  Right Skin anesthesia (see MAR for exact dosages):    Skin anesthesia method:  None Procedure details (see MAR for exact dosages):    Block needle gauge:  25 G   Anesthetic injected:  Lidocaine 1% w/o epi   Steroid injected:  None   Additive injected:  None   Injection procedure:  Anatomic landmarks identified, anatomic landmarks palpated, introduced needle, negative aspiration for blood and incremental injection   Paresthesia:  None Post-procedure details:    Dressing:  None   Outcome:  Anesthesia achieved   Patient tolerance of procedure:  Tolerated well, no immediate complications     Medications  ketorolac (TORADOL) 30 MG/ML injection 30 mg (30 mg Intramuscular Given 09/06/16 1756)     ____________________________________________   INITIAL IMPRESSION / ASSESSMENT AND PLAN / ED COURSE  Pertinent  labs & imaging results that were available during my care of the patient were reviewed by me and considered in my medical decision making (see chart for details).  Clinical Course     Patient's diagnosis is consistent with dental pain. Patient was given a dental block and injection of Toradol in the ED for pain reliefIn which completely alleviated his pain. He is to follow up with [facility] dental tomorrow for further dental care. Patient is given ED precautions to return to the ED for any worsening or new symptoms.    ____________________________________________  FINAL CLINICAL IMPRESSION(S) / ED DIAGNOSES  Final diagnoses:  Pain, dental      NEW MEDICATIONS STARTED DURING THIS VISIT:  Discharge Medication List as of 09/06/2016  6:31 PM           Hope Pigeon, PA-C 09/06/16 2158    Arnaldo Natal, MD 09/06/16 2350

## 2016-10-04 ENCOUNTER — Emergency Department
Admission: EM | Admit: 2016-10-04 | Discharge: 2016-10-04 | Disposition: A | Discharge status: Home / Self Care | Payer: Self-pay | Attending: Emergency Medicine | Admitting: Emergency Medicine

## 2016-10-04 ENCOUNTER — Encounter: Payer: Self-pay | Admitting: Emergency Medicine

## 2016-10-04 DIAGNOSIS — F17210 Nicotine dependence, cigarettes, uncomplicated: Secondary | ICD-10-CM | POA: Insufficient documentation

## 2016-10-04 DIAGNOSIS — Z21 Asymptomatic human immunodeficiency virus [HIV] infection status: Secondary | ICD-10-CM | POA: Insufficient documentation

## 2016-10-04 DIAGNOSIS — K047 Periapical abscess without sinus: Secondary | ICD-10-CM | POA: Insufficient documentation

## 2016-10-04 MED ORDER — TRAMADOL HCL 50 MG PO TABS: 50.0000 mg | ORAL_TABLET | Freq: Four times a day (QID) | ORAL | 0 refills | Status: DC | PRN

## 2016-10-04 MED ORDER — AMOXICILLIN 500 MG PO CAPS: 500.0000 mg | ORAL_CAPSULE | Freq: Three times a day (TID) | ORAL | 0 refills | Status: AC

## 2016-10-04 MED ORDER — TRAMADOL HCL 50 MG PO TABS
50.0000 mg | ORAL_TABLET | Freq: Once | ORAL | Status: AC
  Administered 2016-10-04: 50 mg via ORAL
  Filled 2016-10-04: qty 1

## 2016-10-04 MED ORDER — AMOXICILLIN 500 MG PO CAPS
500.0000 mg | ORAL_CAPSULE | Freq: Three times a day (TID) | ORAL | Status: DC
  Administered 2016-10-04: 500 mg via ORAL
  Filled 2016-10-04: qty 1

## 2016-10-04 NOTE — ED Provider Notes (Signed)
[facility] Emergency Department Provider Note  ____________________________________________  Time seen: Approximately 11:36 PM  I have reviewed the triage vital signs and the nursing notes.   HISTORY  Chief Complaint Facial Swelling    HPI Gabriel [name] is a 40 y.o. male that presents to the emergency department with right cheek swelling and right lower tooth pain for 5 days. Patient had a tooth pulled 2 weeks ago. Patienthe has cavities and has other teeth that need to be pulled. Patient has not taken anything for pain. Patient is not having any difficulty eating, breathing, moving jaw.   Past Medical History:  Diagnosis Date  . Cellulitis   . HIV positive (HCC)   . MRSA infection     Patient Active Problem List   Diagnosis Date Noted  . Hematochezia 03/26/2015  . Dermatitis 06/10/2014  . Dyslipidemia 04/14/2011  . Blood in stool 03/20/2009  . 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 DENTAL CARIES 01/02/2009  . TOBACCO USER 05/27/2008  . INSOMNIA, CHRONIC 05/27/2008  . Human immunodeficiency virus (HIV) disease (HCC) 11/28/2006  . SYPHILIS NOS 11/28/2006    Past Surgical History:  Procedure Laterality Date  . HAND / FINGER LESION EXCISION      Prior to Admission medications   Medication Sig Start Date End Date Taking? Authorizing Provider  amoxicillin (AMOXIL) 500 MG capsule Take 1 capsule (500 mg total) by mouth 3 (three) times daily. 10/04/16 10/14/16  Enid DerryAshley Rukaya Kleinschmidt, PA-C  ciprofloxacin (CIPRO) 500 MG tablet Take 1 tablet (500 mg total) by mouth 2 (two) times daily. 04/24/16   Sharman CheekPhillip Stafford, MD  cyclobenzaprine (FLEXERIL) 10 MG tablet Take 1 tablet (10 mg total) by mouth 3 (three) times daily as needed for muscle spasms. 12/04/15   Jami L Hagler, PA-C  darunavir-cobicistat (PREZCOBIX) 800-150 MG per tablet Take 1 tablet by mouth daily. Swallow whole. Do NOT crush, break or chew tablets. Take with food. 01/06/15   Cliffton AstersJohn  Campbell, MD  emtricitabine-tenofovir AF (DESCOVY) 200-25 MG per tablet Take 1 tablet by mouth daily. 05/13/15   Cliffton AstersJohn Campbell, MD  hydrocortisone 1 % ointment Apply 1 application topically 2 (two) times daily. Patient not taking: Reported on 03/26/2015 06/10/14   Cliffton AstersJohn Campbell, MD  ibuprofen (ADVIL,MOTRIN) 800 MG tablet Take 1 tablet (800 mg total) by mouth every 8 (eight) hours as needed (pain). 12/04/15   Jami L Hagler, PA-C  magic mouthwash SOLN Take 10 mLs by mouth 3 (three) times daily as needed for mouth pain. 09/07/15   Irean HongJade J Sung, MD  nicotine ([employer] NICOTINE TRANSDERMAL SYS) 14 mg/24hr patch Place 1 patch (14 mg total) onto the skin daily. Patient not taking: Reported on 01/06/2015 11/19/13   Ginnie SmartJeffrey C Hatcher, MD  ondansetron (ZOFRAN) 4 MG tablet Take 1 tablet (4 mg total) by mouth every 6 (six) hours as needed for nausea or vomiting. 04/24/16   Sharman CheekPhillip Stafford, MD  oxyCODONE-acetaminophen (ROXICET) 5-325 MG tablet Take 1 tablet by mouth every 6 (six) hours as needed for severe pain. 04/24/16   Sharman CheekPhillip Stafford, MD  traMADol (ULTRAM) 50 MG tablet Take 1 tablet (50 mg total) by mouth every 6 (six) hours as needed. 10/04/16 10/04/17  Enid DerryAshley Ameila Weldon, PA-C    Allergies Abacavir  Family History  Problem Relation Age of Onset  . Hypertension Mother   . Cancer Maternal Grandmother   . Crohn's disease Brother     Social History Social History  Substance Use Topics  . Smoking status: Current Every  Day Smoker    Packs/day: 1.00    Types: Cigarettes  . Smokeless tobacco: Never Used  . Alcohol use 1.8 oz/week    3 Standard drinks or equivalent per week     Review of Systems  Constitutional: No fever/chills Eyes: No visual changes. No discharge ENT: No upper respiratory complaints. Cardiovascular: no chest pain. Respiratory: no cough. No SOB. Gastrointestinal: No abdominal pain.  No nausea, no vomiting.  Musculoskeletal: Negative for musculoskeletal  pain.   ____________________________________________   PHYSICAL EXAM:  VITAL SIGNS: ED Triage Vitals [10/04/16 2021]  Enc Vitals Group     BP 116/74     Pulse Rate 82     Resp 15     Temp 98.2 F (36.8 C)     Temp Source Oral     SpO2 100 %     Weight 185 lb (83.9 kg)     Height 6\' 6"  (1.981 m)     Head Circumference      Peak Flow      Pain Score 8     Pain Loc      Pain Edu?      Excl. in GC?      Constitutional: Alert and oriented. Well appearing and in no acute distress. Eyes: Conjunctivae are normal. PERRL. EOMI. Head: Atraumatic. ENT:      Ears: Tympanic membranes pearly gray with good landmarks.      Nose: No congestion/rhinnorhea.      Mouth/Throat: Mucous membranes are moist. Oropharynx non-erythematous. Tonsils not enlarged. Uvula midline. Several decaying and missing teeth. Tenderness to palpation over the lower gum. Tenderness over cheeks. No TMJ pain. Neck: No stridor. Hematological/Lymphatic/Immunilogical: No cervical lymphadenopathy. Cardiovascular: Normal rate, regular rhythm. Normal S1 and S2.  Good peripheral circulation. Respiratory: Normal respiratory effort without tachypnea or retractions. Lungs CTAB. Good air entry to the bases with no decreased or absent breath sounds. Musculoskeletal: Full range of motion to all extremities. No gross deformities appreciated. Neurologic:  Normal speech and language. No gross focal neurologic deficits are appreciated.  Skin:  Skin is warm, dry and intact. No rash noted. Psychiatric: Mood and affect are normal. Speech and behavior are normal.   ____________________________________________   LABS (all labs ordered are listed, but only abnormal results are displayed)  Labs Reviewed - No data to display ____________________________________________  EKG   ____________________________________________  RADIOLOGY   No results  found.  ____________________________________________    PROCEDURES  Procedure(s) performed:    Procedures    Medications  amoxicillin (AMOXIL) capsule 500 mg (500 mg Oral Given 10/04/16 2124)  traMADol (ULTRAM) tablet 50 mg (50 mg Oral Given 10/04/16 2124)     ____________________________________________   INITIAL IMPRESSION / ASSESSMENT AND PLAN / ED COURSE  Pertinent labs & imaging results that were available during my care of the patient were reviewed by me and considered in my medical decision making (see chart for details).  Review of the [place] CSRS was performed in accordance of the [facility] prior to dispensing any controlled drugs.  Clinical Course     Patient's diagnosis is consistent with tooth infection. Patient will be discharged home with prescriptions for amoxicillin and tramadol. Patient is to follow up with dentist in 7 days. Patient is given ED precautions to return to the ED for any worsening or new symptoms.     ____________________________________________  FINAL CLINICAL IMPRESSION(S) / ED DIAGNOSES  Final diagnoses:  Dental infection      NEW MEDICATIONS STARTED DURING THIS VISIT:  Discharge Medication List as of 10/04/2016  9:37 PM    START taking these medications   Details  amoxicillin (AMOXIL) 500 MG capsule Take 1 capsule (500 mg total) by mouth 3 (three) times daily., Starting Tue 10/04/2016, Until Fri 10/14/2016, Print    traMADol (ULTRAM) 50 MG tablet Take 1 tablet (50 mg total) by mouth every 6 (six) hours as needed., Starting Tue 10/04/2016, Until Wed 10/04/2017, Print            This chart was dictated using voice recognition software/Dragon. Despite best efforts to proofread, errors can occur which can change the meaning. Any change was purely unintentional.   Enid Derry, PA-C 10/04/16 2341    Sharman Cheek, MD 10/04/16 2352

## 2016-10-04 NOTE — ED Triage Notes (Signed)
Pt ambulatory to triage with steady gait, no distress noted. Pt c/o of right facial swelling, upper right tooth pain x5 days. Pt reports having tooth removed 2 weeks ago but has another tooth in the same location with a "hole" in it.

## 2016-10-22 ENCOUNTER — Emergency Department
Admission: EM | Admit: 2016-10-22 | Discharge: 2016-10-22 | Disposition: A | Discharge status: Home / Self Care | Payer: Self-pay | Attending: Emergency Medicine | Admitting: Emergency Medicine

## 2016-10-22 DIAGNOSIS — Z21 Asymptomatic human immunodeficiency virus [HIV] infection status: Secondary | ICD-10-CM | POA: Insufficient documentation

## 2016-10-22 DIAGNOSIS — Z791 Long term (current) use of non-steroidal anti-inflammatories (NSAID): Secondary | ICD-10-CM | POA: Insufficient documentation

## 2016-10-22 DIAGNOSIS — F17210 Nicotine dependence, cigarettes, uncomplicated: Secondary | ICD-10-CM | POA: Insufficient documentation

## 2016-10-22 DIAGNOSIS — A071 Giardiasis [lambliasis]: Secondary | ICD-10-CM | POA: Insufficient documentation

## 2016-10-22 DIAGNOSIS — Z79899 Other long term (current) drug therapy: Secondary | ICD-10-CM | POA: Insufficient documentation

## 2016-10-22 MED ORDER — METRONIDAZOLE 250 MG PO TABS: 250.0000 mg | ORAL_TABLET | Freq: Three times a day (TID) | ORAL | 0 refills | Status: AC

## 2016-10-22 NOTE — ED Notes (Signed)
Pt alert and oriented X4, active, cooperative, pt in NAD. RR even and unlabored, color WNL.    

## 2016-10-23 NOTE — ED Provider Notes (Signed)
[facility] Emergency Department Provider Note  ____________________________________________  Time seen: Approximately 4:33 PM  I have reviewed the triage vital signs and the nursing notes.   HISTORY  Chief Complaint Diarrhea    HPI Gabriel [name] is a 40 y.o. male with a history of HIV presenting to the emergency department with acute diarrhea. Patient states that diarrhea started2 days ago. Patient had approximately 5 episodes of diarrhea today and 4 episodes of diarrhea yesterday. Consistency of diarrhea is liquid like with no evidence of purulent exudate. 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 He is absolutely adamant that he receive antibiotic. Patient states "if I don't get antibiotic, [facility] Patient states that his symptoms are the same as when he had Giardia. Patient was diagnosed with Giardia approximately one year ago. Patient states that he is staying hydrated. He denies abdominal cramping, nausea, vomiting, hematochezia, dysuria or hematuria. He denies fever. No recent travel. He has not attempted alleviating measures.    Past Medical History:  Diagnosis Date  . Cellulitis   . HIV positive (HCC)   . MRSA infection     Patient Active Problem List   Diagnosis Date Noted  . Hematochezia 03/26/2015  . Dermatitis 06/10/2014  . Dyslipidemia 04/14/2011  . Blood in stool 03/20/2009  . 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 DENTAL CARIES 01/02/2009  . TOBACCO USER 05/27/2008  . INSOMNIA, CHRONIC 05/27/2008  . Human immunodeficiency virus (HIV) disease (HCC) 11/28/2006  . SYPHILIS NOS 11/28/2006    Past Surgical History:  Procedure Laterality Date  . HAND / FINGER LESION EXCISION      Prior to Admission medications   Medication Sig Start Date End Date Taking? Authorizing Provider  ciprofloxacin (CIPRO) 500 MG tablet Take 1 tablet (500 mg total) by mouth 2 (two) times daily. 04/24/16   Sharman CheekPhillip  Stafford, MD  cyclobenzaprine (FLEXERIL) 10 MG tablet Take 1 tablet (10 mg total) by mouth 3 (three) times daily as needed for muscle spasms. 12/04/15   Jami L Hagler, PA-C  darunavir-cobicistat (PREZCOBIX) 800-150 MG per tablet Take 1 tablet by mouth daily. Swallow whole. Do NOT crush, break or chew tablets. Take with food. 01/06/15   Cliffton AstersJohn Campbell, MD  emtricitabine-tenofovir AF (DESCOVY) 200-25 MG per tablet Take 1 tablet by mouth daily. 05/13/15   Cliffton AstersJohn Campbell, MD  hydrocortisone 1 % ointment Apply 1 application topically 2 (two) times daily. Patient not taking: Reported on 03/26/2015 06/10/14   Cliffton AstersJohn Campbell, MD  ibuprofen (ADVIL,MOTRIN) 800 MG tablet Take 1 tablet (800 mg total) by mouth every 8 (eight) hours as needed (pain). 12/04/15   Jami L Hagler, PA-C  magic mouthwash SOLN Take 10 mLs by mouth 3 (three) times daily as needed for mouth pain. 09/07/15   Irean HongJade J Sung, MD  metroNIDAZOLE (FLAGYL) 250 MG tablet Take 1 tablet (250 mg total) by mouth 3 (three) times daily. 10/22/16 10/29/16  Orvil FeilJaclyn M Woods, PA-C  nicotine ([employer] NICOTINE TRANSDERMAL SYS) 14 mg/24hr patch Place 1 patch (14 mg total) onto the skin daily. Patient not taking: Reported on 01/06/2015 11/19/13   Ginnie SmartJeffrey C Hatcher, MD  ondansetron (ZOFRAN) 4 MG tablet Take 1 tablet (4 mg total) by mouth every 6 (six) hours as needed for nausea or vomiting. 04/24/16   Sharman CheekPhillip Stafford, MD  oxyCODONE-acetaminophen (ROXICET) 5-325 MG tablet Take 1 tablet by mouth every 6 (six) hours as needed for severe pain. 04/24/16   Sharman CheekPhillip Stafford, MD  traMADol (ULTRAM) 50 MG tablet Take 1 tablet (50 mg total) by mouth every 6 (six) hours as needed. 10/04/16 10/04/17  Enid DerryAshley Wagner, PA-C    Allergies Abacavir  Family History  Problem Relation Age of Onset  . Hypertension Mother   . Cancer Maternal Grandmother   . Crohn's disease Brother     Social History Social History  Substance Use Topics  . Smoking status: Current Every Day Smoker    Packs/day: 1.00     Types: Cigarettes  . Smokeless tobacco: Never Used  . Alcohol use 1.8 oz/week    3 Standard drinks or equivalent per week     Review of Systems  Constitutional: No fever/chills ENT: No upper respiratory complaints. Cardiovascular: no chest pain. Respiratory: no cough. No SOB. Gastrointestinal: No abdominal pain.  No nausea, no vomiting. Has diarrhea.  Genitourinary: Negative for dysuria. No hematuria Musculoskeletal: Negative for musculoskeletal pain. Skin: Negative for rash, abrasions, lacerations, ecchymosis. Neurological: Negative for headaches, focal weakness or numbness. 10-point ROS otherwise negative.  ____________________________________________   PHYSICAL EXAM:  VITAL SIGNS: ED Triage Vitals  Enc Vitals Group     BP 10/22/16 1123 102/73     Pulse Rate 10/22/16 1123 78     Resp 10/22/16 1123 18     Temp 10/22/16 1123 98.2 F (36.8 C)     Temp Source 10/22/16 1123 Oral     SpO2 10/22/16 1123 100 %     Weight --      Height --      Head Circumference --      Peak Flow --      Pain Score 10/22/16 1337 0     Pain Loc --      Pain Edu? --      Excl. in GC? --      Constitutional: Alert and oriented. Well appearing and in no acute distress. Cardiovascular: Normal rate, regular rhythm. Normal S1 and S2.  Good peripheral circulation.No murmurs, gallops or rubs auscultated. Respiratory: Normal respiratory effort without tachypnea or retractions. Lungs CTAB. Good air entry to the bases with no decreased or absent breath sounds. Gastrointestinal: No scars or stria visualized. Bowel sounds 4 quadrants (hyperactive). Soft and nontender to palpation. No guarding or rigidity. No palpable masses. No distention. No CVA tenderness. Skin:  Skin is warm, dry and intact. No rash noted. Psychiatric: Mood and affect are normal. Speech and behavior are normal. Patient exhibits appropriate insight and judgement.   ____________________________________________   LABS (all  labs ordered are listed, but only abnormal results are displayed)  Labs Reviewed  STOOL CULTURE  GIARDIA, EIA; OVA/PARASITE   ____________________________________________  EKG   ____________________________________________  RADIOLOGY   No results found.  ____________________________________________    PROCEDURES  Procedure(s) performed:    Procedures    Medications - No data to display   ____________________________________________   INITIAL IMPRESSION / ASSESSMENT AND PLAN / ED COURSE  Pertinent labs & imaging results that were available during my care of the patient were reviewed by me and considered in my medical decision making (see chart for details).  Review of the [place] CSRS was performed in accordance of the [facility] prior to dispensing any controlled drugs.  Clinical Course    Assessment and plan: Diarrhea:   Patient is disruptive in the emergency department. Patient is demanding antibiotic for diarrhea. He states that his symptoms are consistent with the last time he had Giardia. Patient's stool was sent for culture and for Giardia EIA. Patient was  discharged with Flagyl. Patient education was provided regarding the timeline for receiving stool culture results. He was advised to discontinue Flagyl if symptoms acutely worsen. Vital signs are reassuring at this time. All patient questions were answered.     ____________________________________________  FINAL CLINICAL IMPRESSION(S) / ED DIAGNOSES  Final diagnoses:  Giardiasis      NEW MEDICATIONS STARTED DURING THIS VISIT:  Discharge Medication List as of 10/22/2016  1:25 PM    START taking these medications   Details  metroNIDAZOLE (FLAGYL) 250 MG tablet Take 1 tablet (250 mg total) by mouth 3 (three) times daily., Starting Sat 10/22/2016, Until Sat 10/29/2016, Print            This chart was dictated using voice recognition software/Dragon. Despite best efforts to proofread, errors can  occur which can change the meaning. Any change was purely unintentional.    Orvil Feil, PA-C 10/23/16 1653    Governor Rooks, MD 10/23/16 [phone]

## 2016-10-26 LAB — GIARDIA, EIA; OVA/PARASITE: Giardia Ag, Stl: NEGATIVE

## 2016-10-26 LAB — O&P RESULT

## 2016-10-27 LAB — STOOL CULTURE: E coli, Shiga toxin Assay: NEGATIVE

## 2016-10-27 LAB — STOOL CULTURE REFLEX - CMPCXR

## 2016-10-27 LAB — STOOL CULTURE REFLEX - RSASHR

## 2016-12-21 ENCOUNTER — Encounter: Payer: Self-pay | Admitting: Emergency Medicine

## 2016-12-21 ENCOUNTER — Emergency Department
Admission: EM | Admit: 2016-12-21 | Discharge: 2016-12-21 | Disposition: A | Discharge status: Home / Self Care | Payer: Self-pay | Attending: Emergency Medicine | Admitting: Emergency Medicine

## 2016-12-21 DIAGNOSIS — K047 Periapical abscess without sinus: Secondary | ICD-10-CM | POA: Insufficient documentation

## 2016-12-21 DIAGNOSIS — K029 Dental caries, unspecified: Secondary | ICD-10-CM | POA: Insufficient documentation

## 2016-12-21 DIAGNOSIS — Z791 Long term (current) use of non-steroidal anti-inflammatories (NSAID): Secondary | ICD-10-CM | POA: Insufficient documentation

## 2016-12-21 DIAGNOSIS — Z21 Asymptomatic human immunodeficiency virus [HIV] infection status: Secondary | ICD-10-CM | POA: Insufficient documentation

## 2016-12-21 DIAGNOSIS — F17210 Nicotine dependence, cigarettes, uncomplicated: Secondary | ICD-10-CM | POA: Insufficient documentation

## 2016-12-21 MED ORDER — LIDOCAINE-EPINEPHRINE 2 %-1:100000 IJ SOLN
1.7000 mL | Freq: Once | INTRAMUSCULAR | Status: AC
  Administered 2016-12-21: 1.7 mL
  Filled 2016-12-21: qty 1.7

## 2016-12-21 MED ORDER — TRAMADOL HCL 50 MG PO TABS: 50.0000 mg | ORAL_TABLET | Freq: Three times a day (TID) | ORAL | 0 refills | Status: DC | PRN

## 2016-12-21 MED ORDER — PENICILLIN V POTASSIUM 500 MG PO TABS: 500.0000 mg | ORAL_TABLET | Freq: Four times a day (QID) | ORAL | 0 refills | Status: DC

## 2016-12-21 MED ORDER — TRAMADOL HCL 50 MG PO TABS
50.0000 mg | ORAL_TABLET | Freq: Once | ORAL | Status: AC
  Administered 2016-12-21: 50 mg via ORAL
  Filled 2016-12-21: qty 1

## 2016-12-21 MED ORDER — PENICILLIN V POTASSIUM 500 MG PO TABS
500.0000 mg | ORAL_TABLET | Freq: Once | ORAL | Status: AC
  Administered 2016-12-21: 500 mg via ORAL
  Filled 2016-12-21: qty 1

## 2016-12-21 MED ORDER — LIDOCAINE VISCOUS 2 % MT SOLN
15.0000 mL | Freq: Once | OROMUCOSAL | Status: AC
  Administered 2016-12-21: 15 mL via OROMUCOSAL
  Filled 2016-12-21: qty 15

## 2016-12-21 NOTE — ED Provider Notes (Signed)
[facility] Emergency Department Provider Note ____________________________________________  Time seen: 2148  I have reviewed the triage vital signs and the nursing notes.  HISTORY  Chief Complaint  Dental Pain  HPI Gabriel [name] is a 41 y.o. male resistance to the ED evaluation of pain and swelling to the right side of the face and cheek. He describes a pointing dental abscess to the right upper jaw. He gives a history of previous dental abscesses, at least one had a needle aspiration performed a few months back. He denies any interim fevers, chills, sweats. He describes the onset of the swelling over the last 2 days.   Past Medical History:  Diagnosis Date  . Cellulitis   . HIV positive (HCC)   . MRSA infection     Patient Active Problem List   Diagnosis Date Noted  . Hematochezia 03/26/2015  . Dermatitis 06/10/2014  . Dyslipidemia 04/14/2011  . Blood in stool 03/20/2009  . 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 DENTAL CARIES 01/02/2009  . TOBACCO USER 05/27/2008  . INSOMNIA, CHRONIC 05/27/2008  . Human immunodeficiency virus (HIV) disease (HCC) 11/28/2006  . SYPHILIS NOS 11/28/2006    Past Surgical History:  Procedure Laterality Date  . HAND / FINGER LESION EXCISION      Prior to Admission medications   Medication Sig Start Date End Date Taking? Authorizing Provider  ciprofloxacin (CIPRO) 500 MG tablet Take 1 tablet (500 mg total) by mouth 2 (two) times daily. 04/24/16   Sharman Cheek, MD  cyclobenzaprine (FLEXERIL) 10 MG tablet Take 1 tablet (10 mg total) by mouth 3 (three) times daily as needed for muscle spasms. 12/04/15   Jami L Hagler, PA-C  darunavir-cobicistat (PREZCOBIX) 800-150 MG per tablet Take 1 tablet by mouth daily. Swallow whole. Do NOT crush, break or chew tablets. Take with food. 01/06/15   Cliffton Asters, MD  emtricitabine-tenofovir AF (DESCOVY) 200-25 MG per tablet Take 1 tablet by mouth daily. 05/13/15    Cliffton Asters, MD  hydrocortisone 1 % ointment Apply 1 application topically 2 (two) times daily. Patient not taking: Reported on 03/26/2015 06/10/14   Cliffton Asters, MD  ibuprofen (ADVIL,MOTRIN) 800 MG tablet Take 1 tablet (800 mg total) by mouth every 8 (eight) hours as needed (pain). 12/04/15   Jami L Hagler, PA-C  magic mouthwash SOLN Take 10 mLs by mouth 3 (three) times daily as needed for mouth pain. 09/07/15   Irean Hong, MD  nicotine ([employer] NICOTINE TRANSDERMAL SYS) 14 mg/24hr patch Place 1 patch (14 mg total) onto the skin daily. Patient not taking: Reported on 01/06/2015 11/19/13   Ginnie Smart, MD  ondansetron (ZOFRAN) 4 MG tablet Take 1 tablet (4 mg total) by mouth every 6 (six) hours as needed for nausea or vomiting. 04/24/16   Sharman Cheek, MD  oxyCODONE-acetaminophen (ROXICET) 5-325 MG tablet Take 1 tablet by mouth every 6 (six) hours as needed for severe pain. 04/24/16   Sharman Cheek, MD  penicillin v potassium (VEETID) 500 MG tablet Take 1 tablet (500 mg total) by mouth 4 (four) times daily. 12/21/16   Shaden Higley V Bacon Sweta Halseth, PA-C  traMADol (ULTRAM) 50 MG tablet Take 1 tablet (50 mg total) by mouth 3 (three) times daily as needed. 12/21/16   Zeola Brys V Bacon Tashona Calk, PA-C    Allergies Abacavir  Family History  Problem Relation Age of Onset  . Hypertension Mother   . Cancer Maternal Grandmother   . Crohn's disease Brother  Social History Social History  Substance Use Topics  . Smoking status: Current Every Day Smoker    Packs/day: 1.00    Types: Cigarettes  . Smokeless tobacco: Never Used  . Alcohol use 1.8 oz/week    3 Standard drinks or equivalent per week    Review of Systems  Constitutional: Negative for fever. Eyes: Negative for visual changes. ENT: Negative for sore throat. Dental pain and facial swelling as above. Cardiovascular: Negative for chest pain. Respiratory: Negative for shortness of breath. Gastrointestinal: Negative for abdominal pain,  vomiting and diarrhea. Skin: Negative for rash. Neurological: Negative for headaches, focal weakness or numbness. ____________________________________________  PHYSICAL EXAM:  VITAL SIGNS: ED Triage Vitals  Enc Vitals Group     BP 12/21/16 2040 121/84     Pulse Rate 12/21/16 2040 76     Resp 12/21/16 2040 20     Temp 12/21/16 2040 98.7 F (37.1 C)     Temp Source 12/21/16 2040 Oral     SpO2 12/21/16 2040 97 %     Weight 12/21/16 2041 187 lb (84.8 kg)     Height 12/21/16 2041 6\' 6"  (1.981 m)     Head Circumference --      Peak Flow --      Pain Score 12/21/16 2040 9     Pain Loc --      Pain Edu? --      Excl. in GC? --    Constitutional: Alert and oriented. Well appearing and in no distress. Head: Normocephalic and atraumatic. Eyes: Conjunctivae are normal. PERRL. Normal extraocular movements Ears: Canals clear. TMs intact bilaterally. Nose: No congestion/rhinorrhea/epistaxis. Mouth/Throat: Mucous membranes are moist. Uvula is midline and tonsils are flat. No oropharyngeal lesions are appreciated. Patient is noted to have a rounded prominent abscess to the upper right buccal mucosa at the first molar. There appears to be some fluctuance in the same area. No brawny, sublingual edema is noted. Neck: Supple. No thyromegaly. Hematological/Lymphatic/Immunological: No cervical lymphadenopathy. Cardiovascular: Normal rate, regular rhythm. Normal distal pulses. Respiratory: Normal respiratory effort. No wheezes/rales/rhonchi. Skin:  Skin is warm, dry and intact. No rash noted. Psychiatric: Mood and affect are normal. Patient exhibits appropriate insight and judgment. ____________________________________________  PROCEDURES  Pen VK 500 mg PO Ultram 50 mg PO Viscous lidocaine 2% gargle  INCISION AND DRAINAGE Performed by: Lissa HoardMenshew, Ethylene Reznick V Bacon Consent: Verbal consent obtained. Risks and benefits: risks, benefits and alternatives were discussed Type: abscess  Body area:  right upper buccal mucosa  Anesthesia: local infiltration  Incision was made with a scalpel.  Local anesthetic: lidocaine 2% w/ epinephrine  Anesthetic total: 1 ml  Complexity: complex Blunt dissection to break up loculations  Drainage: purulent & blood  Drainage amount: small  Patient tolerance: Patient tolerated the procedure well with no immediate complications. ____________________________________________  INITIAL IMPRESSION / ASSESSMENT AND PLAN / ED COURSE  Patient with an acute dental abscess secondary to caries to the right upper gumline. He is status post a 90 procedure with good expression of purulent material and resolution of some the patient's symptoms. He is discharged with a prescription for pen VK, Ultram, and is advised [facility]. Return precautions are reviewed. ____________________________________________  FINAL CLINICAL IMPRESSION(S) / ED DIAGNOSES  Final diagnoses:  Dental abscess  Dental caries      Lissa HoardJenise V Bacon Hughie Melroy, PA-C 12/21/16 2303    Rockne MenghiniAnne-Caroline Norman, MD 12/21/16 2341

## 2016-12-21 NOTE — ED Notes (Signed)
Pt states R sided dental pain that began yesterday, states had same in December and was given antibiotic. Pt talking in complete sentences. Asked pt if he has contacted a dentist and he stated no. Pt informed that ultimately he will need to see a dentist if it is an abscess, stated we can give antibiotics and pain medicine but he has to follow up with dentist. Pt unhappy about hearing this stating "I'm going to stop coming to this hospital."

## 2016-12-21 NOTE — ED Triage Notes (Signed)
Patient ambulatory to triage with steady gait, without difficulty or distress noted; pt reports right upper dental pain/swelling and is concerned that is an abscess

## 2016-12-21 NOTE — Discharge Instructions (Signed)
Take the antibiotic until all pills are gone. Take the pain medicine as needed. Rinse regularly with warm-salty water to promote healing. Brush at least twice daily. Follow[facility] for definitive treatment.

## 2017-04-24 ENCOUNTER — Other Ambulatory Visit: Payer: Self-pay

## 2017-05-16 ENCOUNTER — Telehealth: Payer: Self-pay | Admitting: *Deleted

## 2017-05-16 ENCOUNTER — Ambulatory Visit: Payer: Self-pay | Admitting: Internal Medicine

## 2017-05-16 NOTE — Telephone Encounter (Signed)
sent [facility] telephone number to the patient's phone to call [facility] back

## 2017-05-23 ENCOUNTER — Emergency Department
Admission: EM | Admit: 2017-05-23 | Discharge: 2017-05-23 | Disposition: A | Discharge status: Home / Self Care | Payer: BLUE CROSS/BLUE SHIELD | Attending: Emergency Medicine | Admitting: Emergency Medicine

## 2017-05-23 ENCOUNTER — Emergency Department: Payer: BLUE CROSS/BLUE SHIELD

## 2017-05-23 ENCOUNTER — Encounter: Payer: Self-pay | Admitting: Emergency Medicine

## 2017-05-23 DIAGNOSIS — Z79899 Other long term (current) drug therapy: Secondary | ICD-10-CM | POA: Insufficient documentation

## 2017-05-23 DIAGNOSIS — K6289 Other specified diseases of anus and rectum: Secondary | ICD-10-CM | POA: Diagnosis present

## 2017-05-23 DIAGNOSIS — A630 Anogenital (venereal) warts: Secondary | ICD-10-CM | POA: Diagnosis not present

## 2017-05-23 DIAGNOSIS — F17210 Nicotine dependence, cigarettes, uncomplicated: Secondary | ICD-10-CM | POA: Insufficient documentation

## 2017-05-23 DIAGNOSIS — M79606 Pain in leg, unspecified: Secondary | ICD-10-CM | POA: Insufficient documentation

## 2017-05-23 LAB — COMPREHENSIVE METABOLIC PANEL
ALT: 27 U/L (ref 17–63)
AST: 51 U/L — AB (ref 15–41)
Albumin: 3.7 g/dL (ref 3.5–5.0)
Alkaline Phosphatase: 73 U/L (ref 38–126)
Anion gap: 5 (ref 5–15)
BUN: 16 mg/dL (ref 6–20)
CHLORIDE: 106 mmol/L (ref 101–111)
CO2: 30 mmol/L (ref 22–32)
CREATININE: 1.02 mg/dL (ref 0.61–1.24)
Calcium: 8.7 mg/dL — ABNORMAL LOW (ref 8.9–10.3)
GFR calc non Af Amer: >60 mL/min (ref >60)
Glucose, Bld: 102 mg/dL — ABNORMAL HIGH (ref 65–99)
Potassium: 3.7 mmol/L (ref 3.5–5.1)
SODIUM: 141 mmol/L (ref 135–145)
Total Bilirubin: 0.8 mg/dL (ref 0.3–1.2)
Total Protein: 7.1 g/dL (ref 6.5–8.1)

## 2017-05-23 LAB — URINALYSIS, COMPLETE (UACMP) WITH MICROSCOPIC
BACTERIA UA: NONE SEEN
BILIRUBIN URINE: NEGATIVE
GLUCOSE, UA: NEGATIVE mg/dL
HGB URINE DIPSTICK: NEGATIVE
KETONES UR: NEGATIVE mg/dL
NITRITE: NEGATIVE
PROTEIN: NEGATIVE mg/dL
Specific Gravity, Urine: 1.029 (ref 1.005–1.030)
pH: 6 (ref 5.0–8.0)

## 2017-05-23 LAB — URINE DRUG SCREEN, QUALITATIVE (ARMC ONLY)
Amphetamines, Ur Screen: POSITIVE — AB
BARBITURATES, UR SCREEN: NOT DETECTED
BENZODIAZEPINE, UR SCRN: NOT DETECTED
CANNABINOID 50 NG, UR ~~LOC~~: NOT DETECTED
Cocaine Metabolite,Ur ~~LOC~~: NOT DETECTED
MDMA (ECSTASY) UR SCREEN: NOT DETECTED
Methadone Scn, Ur: NOT DETECTED
Opiate, Ur Screen: NOT DETECTED
PHENCYCLIDINE (PCP) UR S: NOT DETECTED
TRICYCLIC, UR SCREEN: NOT DETECTED

## 2017-05-23 LAB — CBC WITH DIFFERENTIAL/PLATELET
BASOS ABS: 0 10*3/uL (ref 0–0.1)
Basophils Relative: 1 %
EOS ABS: 0.1 10*3/uL (ref 0–0.7)
EOS PCT: 2 %
HCT: 44.7 % (ref 40.0–52.0)
Hemoglobin: 15.1 g/dL (ref 13.0–18.0)
Lymphocytes Relative: 36 %
Lymphs Abs: 1.3 10*3/uL (ref 1.0–3.6)
MCH: 31.9 pg (ref 26.0–34.0)
MCHC: 33.8 g/dL (ref 32.0–36.0)
MCV: 94.5 fL (ref 80.0–100.0)
MONO ABS: 0.5 10*3/uL (ref 0.2–1.0)
Monocytes Relative: 14 %
Neutro Abs: 1.7 10*3/uL (ref 1.4–6.5)
Neutrophils Relative %: 47 %
PLATELETS: 154 10*3/uL (ref 150–440)
RBC: 4.72 MIL/uL (ref 4.40–5.90)
RDW: 15 % — AB (ref 11.5–14.5)
WBC: 3.6 10*3/uL — AB (ref 3.8–10.6)

## 2017-05-23 MED ORDER — POLYETHYLENE GLYCOL 3350 17 G PO PACK: 17.0000 g | PACK | Freq: Every day | ORAL | 0 refills | Status: DC

## 2017-05-23 MED ORDER — IMIQUIMOD 5 % EX CREA: TOPICAL_CREAM | CUTANEOUS | 1 refills | Status: DC

## 2017-05-23 NOTE — Discharge Instructions (Signed)
Please seek medical attention for any high fevers, chest pain, shortness of breath, change in behavior, persistent vomiting, bloody stool or any other new or concerning symptoms.  

## 2017-05-23 NOTE — ED Notes (Signed)
EDP at bedside  

## 2017-05-23 NOTE — ED Triage Notes (Addendum)
C/O "warts around anus" x 1 month.  Patient is HIV +, and has not taken antivirals for over a year.  Also states he is using IV drugs, but also c/o bilateral leg pain.

## 2017-05-23 NOTE — ED Notes (Signed)
States he believes he has anal warts, states he was tried IV drugs and feels as if someone gave him a dirty needle, states general leg pain and left arm pain, awake and alert in no acute distress

## 2017-05-23 NOTE — ED Provider Notes (Signed)
[facility] Emergency Department Provider Note  ____________________________________________   I have reviewed the triage vital signs and the nursing notes.   HISTORY  Chief Complaint Rectal Pain and Leg Pain   History limited by: Not Limited   HPI Gabriel [name] is a 41 y.o. male who presents to the emergency department today because of concerns for gross around his anus and rectum and difficulty with defecation. Patient states that he has noticed these symptoms for roughly 1 month. The patient states that he does feel like sometimes there is a blockage. He feels like some of these are internal. He has noticed a small amount of GI bleeding. He states that he sits on the toilet for a long time trying to defecate. In addition the patient is complaining of some leg cramping. He denies any fevers. No nausea or vomiting. States that he has not been on his antiretrovirals for roughly one year. He also has been using iv drugs.    Past Medical History:  Diagnosis Date  . Cellulitis   . HIV positive (HCC)   . MRSA infection     Patient Active Problem List   Diagnosis Date Noted  . Hematochezia 03/26/2015  . Dermatitis 06/10/2014  . Dyslipidemia 04/14/2011  . Blood in stool 03/20/2009  . 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 DENTAL CARIES 01/02/2009  . TOBACCO USER 05/27/2008  . INSOMNIA, CHRONIC 05/27/2008  . Human immunodeficiency virus (HIV) disease (HCC) 11/28/2006  . SYPHILIS NOS 11/28/2006    Past Surgical History:  Procedure Laterality Date  . HAND / FINGER LESION EXCISION      Prior to Admission medications   Medication Sig Start Date End Date Taking? Authorizing Provider  ciprofloxacin (CIPRO) 500 MG tablet Take 1 tablet (500 mg total) by mouth 2 (two) times daily. 04/24/16   Sharman CheekStafford, Phillip, MD  cyclobenzaprine (FLEXERIL) 10 MG tablet Take 1 tablet (10 mg total) by mouth 3 (three) times daily as needed for muscle spasms.  12/04/15   Hagler, Jami L, PA-C  darunavir-cobicistat (PREZCOBIX) 800-150 MG per tablet Take 1 tablet by mouth daily. Swallow whole. Do NOT crush, break or chew tablets. Take with food. 01/06/15   Cliffton Astersampbell, John, MD  emtricitabine-tenofovir AF (DESCOVY) 200-25 MG per tablet Take 1 tablet by mouth daily. 05/13/15   Cliffton Astersampbell, John, MD  hydrocortisone 1 % ointment Apply 1 application topically 2 (two) times daily. Patient not taking: Reported on 03/26/2015 06/10/14   Cliffton Astersampbell, John, MD  ibuprofen (ADVIL,MOTRIN) 800 MG tablet Take 1 tablet (800 mg total) by mouth every 8 (eight) hours as needed (pain). 12/04/15   Hagler, Jami L, PA-C  magic mouthwash SOLN Take 10 mLs by mouth 3 (three) times daily as needed for mouth pain. 09/07/15   Irean HongSung, Jade J, MD  nicotine ([employer] NICOTINE TRANSDERMAL SYS) 14 mg/24hr patch Place 1 patch (14 mg total) onto the skin daily. Patient not taking: Reported on 01/06/2015 11/19/13   Ginnie SmartHatcher, Jeffrey C, MD  ondansetron (ZOFRAN) 4 MG tablet Take 1 tablet (4 mg total) by mouth every 6 (six) hours as needed for nausea or vomiting. 04/24/16   Sharman CheekStafford, Phillip, MD  oxyCODONE-acetaminophen (ROXICET) 5-325 MG tablet Take 1 tablet by mouth every 6 (six) hours as needed for severe pain. 04/24/16   Sharman CheekStafford, Phillip, MD  penicillin v potassium (VEETID) 500 MG tablet Take 1 tablet (500 mg total) by mouth 4 (four) times daily. 12/21/16   Menshew, Charlesetta IvoryJenise V Bacon, PA-C  traMADol Janean Sark(ULTRAM) 50  MG tablet Take 1 tablet (50 mg total) by mouth 3 (three) times daily as needed. 12/21/16   Menshew, Charlesetta IvoryJenise V Bacon, PA-C    Allergies Abacavir  Family History  Problem Relation Age of Onset  . Hypertension Mother   . Cancer Maternal Grandmother   . Crohn's disease Brother     Social History Social History  Substance Use Topics  . Smoking status: Current Every Day Smoker    Packs/day: 1.00    Types: Cigarettes  . Smokeless tobacco: Never Used  . Alcohol use 1.8 oz/week    3 Standard drinks or equivalent  per week    Review of Systems Constitutional: No fever/chills Eyes: No visual changes. ENT: No sore throat. Cardiovascular: Denies chest pain. Respiratory: Denies shortness of breath. Gastrointestinal: Positive for growths around the anus.  Genitourinary: Negative for dysuria. Musculoskeletal: Positive for leg pain. Skin: Negative for rash. Neurological: Negative for headaches, focal weakness or numbness.  ____________________________________________   PHYSICAL EXAM:  VITAL SIGNS: ED Triage Vitals  Enc Vitals Group     BP 05/23/17 1705 103/63     Pulse Rate 05/23/17 1705 77     Resp 05/23/17 1705 16     Temp 05/23/17 1705 98.5 F (36.9 C)     Temp Source 05/23/17 1705 Oral     SpO2 05/23/17 1705 99 %     Weight 05/23/17 1702 190 lb (86.2 kg)     Height 05/23/17 1702 6\' 6"  (1.981 m)     Head Circumference --      Peak Flow --      Pain Score 05/23/17 1702 5    Constitutional: Alert and oriented. Well appearing and in no distress. Eyes: Conjunctivae are normal.  ENT   Head: Normocephalic and atraumatic.   Nose: No congestion/rhinnorhea.   Mouth/Throat: Mucous membranes are moist.   Neck: No stridor. Hematological/Lymphatic/Immunilogical: No cervical lymphadenopathy. Cardiovascular: Normal rate, regular rhythm.  No murmurs, rubs, or gallops. Respiratory: Normal respiratory effort without tachypnea nor retractions. Breath sounds are clear and equal bilaterally. No wheezes/rales/rhonchi. Gastrointestinal: Soft and non tender. No rebound. No guarding.  Rectal: Multiple small perianal warts.  Musculoskeletal: Normal range of motion in all extremities. No lower extremity edema. Neurologic:  Normal speech and language. No gross focal neurologic deficits are appreciated.  Skin:  Skin is warm, dry and intact. No rash noted. Psychiatric: Mood and affect are normal. Speech and behavior are normal. Patient exhibits appropriate insight and  judgment.  ____________________________________________    LABS (pertinent positives/negatives)  Labs Reviewed  CBC WITH DIFFERENTIAL/PLATELET - Abnormal; Notable for the following:       Result Value   WBC 3.6 (*)    RDW 15.0 (*)    All other components within normal limits  COMPREHENSIVE METABOLIC PANEL - Abnormal; Notable for the following:    Glucose, Bld 102 (*)    Calcium 8.7 (*)    AST 51 (*)    All other components within normal limits  URINALYSIS, COMPLETE (UACMP) WITH MICROSCOPIC - Abnormal; Notable for the following:    Color, Urine YELLOW (*)    APPearance CLEAR (*)    Leukocytes, UA TRACE (*)    Squamous Epithelial / LPF 0-5 (*)    All other components within normal limits  URINE DRUG SCREEN, QUALITATIVE ([facility] ONLY) - Abnormal; Notable for the following:    Amphetamines, Ur Screen POSITIVE (*)    All other components within normal limits  URINE CULTURE     ____________________________________________  EKG  None  ____________________________________________    RADIOLOGY  Abd x-ray IMPRESSION:  Nonobstructed bowel-gas pattern with mild to moderate volume of  retained stool in the colon.     ___________________________________________   PROCEDURES  Procedures  ____________________________________________   INITIAL IMPRESSION / ASSESSMENT AND PLAN / ED COURSE  Pertinent labs & imaging results that were available during my care of the patient were reviewed by me and considered in my medical decision making (see chart for details).  Patient presented to the emergency department today with primary concern for anginal warts. 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 Additionally patient was concerned for difficulty with bowel movements and possible discharge. X-ray does not show any obstructive bowel gas pattern. Blood work without any leukocytosis. In addition patient was complaining of some bilateral leg plan. At this point  unclear etiology. Did have a discussion with patient about the importance of primary care follow-up and discuss it with his infectious disease doctor the importance of restarting antiretrovirals.  ____________________________________________   FINAL CLINICAL IMPRESSION(S) / ED DIAGNOSES  Final diagnoses:  Perianal venereal warts     Note: This dictation was prepared with Dragon dictation. Any transcriptional errors that result from this process are unintentional     Phineas Semen, MD 05/23/17 [phone]

## 2017-05-25 LAB — URINE CULTURE

## 2017-06-07 ENCOUNTER — Telehealth: Payer: Self-pay | Admitting: *Deleted

## 2017-06-07 NOTE — Telephone Encounter (Signed)
Patient called, asked to be seen first thing tomorrow morning. He states he cannot pass stool, has had this issue for a while, feels no one has taken him seriously in order to help him. He states this is an emergency and he needs to be seen.  He was at the Emergency Room on 7/31 where he was instructed to find primary care and to [facility]eng[facility]. He has been out of [facility] for 2 years, off medication more than 1 year. He states that he feels the warts are blocking him from passing stool, that when he wipes he has bright red blood on the toilet paper.  Patient was offered an appointment for 9/4 with Dr Orvan Falconerampbell, he agreed, stating "well, I don't have a choice, do I?" Patient states he now has insurance, is upset that he cannot get into our clinic sooner. RN again advised that we are a specialist office and do not have open appointments for sick visits, that he has been out of care and needs to reestablish with Dr Orvan Falconerampbell.  RN advised patient that he may need a referral to another specialist, such as GI or Surgery, asked him to search on his insurance's website to engage in primary care.  He knows when to seek emergent care. He accepted the 9/4 appointment. Andree CossHowell, Apphia Cropley M, RN

## 2017-06-27 ENCOUNTER — Ambulatory Visit (INDEPENDENT_AMBULATORY_CARE_PROVIDER_SITE_OTHER): Payer: BLUE CROSS/BLUE SHIELD | Admitting: Internal Medicine

## 2017-06-27 ENCOUNTER — Encounter: Payer: Self-pay | Admitting: Internal Medicine

## 2017-06-27 ENCOUNTER — Ambulatory Visit (INDEPENDENT_AMBULATORY_CARE_PROVIDER_SITE_OTHER): Payer: BLUE CROSS/BLUE SHIELD | Admitting: Licensed Clinical Social Worker

## 2017-06-27 ENCOUNTER — Other Ambulatory Visit (HOSPITAL_COMMUNITY)
Admission: RE | Admit: 2017-06-27 | Discharge: 2017-06-27 | Disposition: A | Discharge status: Home / Self Care | Payer: BLUE CROSS/BLUE SHIELD | Source: Ambulatory Visit | Attending: Internal Medicine | Admitting: Internal Medicine

## 2017-06-27 DIAGNOSIS — F32A Depression, unspecified: Secondary | ICD-10-CM | POA: Insufficient documentation

## 2017-06-27 DIAGNOSIS — A630 Anogenital (venereal) warts: Secondary | ICD-10-CM | POA: Diagnosis not present

## 2017-06-27 DIAGNOSIS — F325 Major depressive disorder, single episode, in full remission: Secondary | ICD-10-CM | POA: Diagnosis not present

## 2017-06-27 DIAGNOSIS — F1990 Other psychoactive substance use, unspecified, uncomplicated: Secondary | ICD-10-CM | POA: Diagnosis not present

## 2017-06-27 DIAGNOSIS — B20 Human immunodeficiency virus [HIV] disease: Secondary | ICD-10-CM | POA: Insufficient documentation

## 2017-06-27 DIAGNOSIS — F329 Major depressive disorder, single episode, unspecified: Secondary | ICD-10-CM | POA: Insufficient documentation

## 2017-06-27 DIAGNOSIS — F1510 Other stimulant abuse, uncomplicated: Secondary | ICD-10-CM

## 2017-06-27 LAB — CBC
HCT: 44.3 % (ref 38.5–50.0)
HEMOGLOBIN: 14.6 g/dL (ref 13.2–17.1)
MCH: 30.9 pg (ref 27.0–33.0)
MCHC: 33 g/dL (ref 32.0–36.0)
MCV: 93.9 fL (ref 80.0–100.0)
MPV: 11.2 fL (ref 7.5–12.5)
Platelets: 186 10*3/uL (ref 140–400)
RBC: 4.72 MIL/uL (ref 4.20–5.80)
RDW: 14.4 % (ref 11.0–15.0)
WBC: 4.2 10*3/uL (ref 3.8–10.8)

## 2017-06-27 MED ORDER — DARUNAVIR-COBICISTAT 800-150 MG PO TABS: 1.0000 | ORAL_TABLET | Freq: Every day | ORAL | 11 refills | Status: DC

## 2017-06-27 MED ORDER — EMTRICITABINE-TENOFOVIR AF 200-25 MG PO TABS: 1.0000 | ORAL_TABLET | Freq: Every day | ORAL | 11 refills | Status: DC

## 2017-06-27 MED FILL — DESCOVY 200-25 MG TABS: 200-25 | 30 days supply | Qty: 30 | Fill #0

## 2017-06-27 MED FILL — PREZCOBIX 800 MG-150 MG TAB: 800-150 | 30 days supply | Qty: 30 | Fill #0

## 2017-06-27 NOTE — Progress Notes (Signed)
Patient Active Problem List   Diagnosis Date Noted  . Human immunodeficiency virus (HIV) disease (Gabriel [name]) 11/28/2006    Priority: High  . Blood in stool 03/20/2009    Priority: Medium  . SYPHILIS NOS 11/28/2006    Priority: Medium  . Genital warts 06/27/2017  . IVDU (intravenous drug user) 06/27/2017  . Depression 06/27/2017  . Hematochezia 03/26/2015  . Dermatitis 06/10/2014  . Dyslipidemia 04/14/2011  . 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 DENTAL CARIES 01/02/2009  . TOBACCO USER 05/27/2008  . INSOMNIA, CHRONIC 05/27/2008    Patient's Medications  New Prescriptions   No medications on file  Previous Medications   CIPROFLOXACIN (CIPRO) 500 MG TABLET    Take 1 tablet (500 mg total) by mouth 2 (two) times daily.   CYCLOBENZAPRINE (FLEXERIL) 10 MG TABLET    Take 1 tablet (10 mg total) by mouth 3 (three) times daily as needed for muscle spasms.   DARUNAVIR-COBICISTAT (PREZCOBIX) 800-150 MG PER TABLET    Take 1 tablet by mouth daily. Swallow whole. Do NOT crush, break or chew tablets. Take with food.   EMTRICITABINE-TENOFOVIR AF (DESCOVY) 200-25 MG PER TABLET    Take 1 tablet by mouth daily.   HYDROCORTISONE 1 % OINTMENT    Apply 1 application topically 2 (two) times daily.   IBUPROFEN (ADVIL,MOTRIN) 800 MG TABLET    Take 1 tablet (800 mg total) by mouth every 8 (eight) hours as needed (pain).   IMIQUIMOD (ALDARA) 5 % CREAM    Apply to affected area three times weekly   MAGIC MOUTHWASH SOLN    Take 10 mLs by mouth 3 (three) times daily as needed for mouth pain.   NICOTINE ([employer] NICOTINE TRANSDERMAL SYS) 14 MG/24HR PATCH    Place 1 patch (14 mg total) onto the skin daily.   ONDANSETRON (ZOFRAN) 4 MG TABLET    Take 1 tablet (4 mg total) by mouth every 6 (six) hours as needed for nausea or vomiting.   OXYCODONE-ACETAMINOPHEN (ROXICET) 5-325 MG TABLET    Take 1 tablet by mouth every 6 (six) hours as needed for severe pain.   PENICILLIN V POTASSIUM  (VEETID) 500 MG TABLET    Take 1 tablet (500 mg total) by mouth 4 (four) times daily.   POLYETHYLENE GLYCOL (MIRALAX) PACKET    Take 17 g by mouth daily.   TRAMADOL (ULTRAM) 50 MG TABLET    Take 1 tablet (50 mg total) by mouth 3 (three) times daily as needed.  Modified Medications   No medications on file  Discontinued Medications   No medications on file    Subjective: Gabriel [name] is in for his first visit in 2 years. He was taking Descovy and Prezcobix but states that he stopped about one year ago. He stopped because he started hanging out with the wrong people and started using drugs again. His drug use escalated to IV methamphetamine earlier this year. He became very depressed and unable to sleep. He says that he became suicidal and tried to kill himself by taking 2 bottles Descovy. Obviously that did not work. He talked with his sister and says that he stopped using all drugs 2 months ago. He is feeling much better now.  He is not on any medication currently. 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 He works from noon to 9 PM Monday through Friday. He has had for male partners in the last year, all HIV  positive. They do not use condoms. He thinks some or on HIV medications and to partners were not.  He has been seen in the ED and was given Aldara cream for perianal warts. He says that he has a fullness in his rectum and frequently sees blood on his toilet paper.  Review of Systems: Review of Systems  Constitutional: Negative for chills, diaphoresis, fever, malaise/fatigue and weight loss.  HENT: Negative for sore throat.   Respiratory: Negative for cough, sputum production and shortness of breath.   Cardiovascular: Negative for chest pain.  Gastrointestinal: Positive for blood in stool. Negative for abdominal pain, constipation, diarrhea, heartburn, nausea and vomiting.  Genitourinary: Negative for dysuria and frequency.  Musculoskeletal: Negative for joint pain and myalgias.  Skin:  Negative for rash.  Neurological: Negative for dizziness and headaches.  Psychiatric/Behavioral: Positive for depression, substance abuse and suicidal ideas. The patient has insomnia. The patient is not nervous/anxious.     Past Medical History:  Diagnosis Date  . Cellulitis   . HIV positive ([name])   . MRSA infection     Social History  Substance Use Topics  . Smoking status: Current Every Day Smoker    Packs/day: 1.00    Types: Cigarettes  . Smokeless tobacco: Never Used  . Alcohol use 1.8 oz/week    3 Standard drinks or equivalent per week     Comment: socially     Family History  Problem Relation Age of Onset  . Hypertension Mother   . Cancer Maternal Grandmother   . Crohn's disease Brother     Allergies  Allergen Reactions  . Abacavir     HLA [id number] positive - should not receive abacavir due to risk of hypersensitivity    Objective:  Vitals:   06/27/17 0949  BP: 129/86  Pulse: 86  Temp: 98.3 F (36.8 C)  TempSrc: Oral  Weight: 186 lb (84.4 kg)  Height: 6' 6" (1.981 m)   Body mass index is 21.49 kg/m.  Physical Exam  Constitutional: He is oriented to person, place, and time.  He is smiling and in good spirits.  HENT:  Mouth/Throat: No oropharyngeal exudate.  Eyes: Conjunctivae are normal.  Cardiovascular: Normal rate and regular rhythm.   No murmur heard. Pulmonary/Chest: Effort normal and breath sounds normal.  Abdominal: Soft. He exhibits no mass. There is no tenderness.  Genitourinary: Penis normal.  Genitourinary Comments: Multiple, small perirectal warts.  Musculoskeletal: Normal range of motion.  Neurological: He is alert and oriented to person, place, and time.  Skin: No rash noted.  Psychiatric: Mood and affect normal.    Lab Results Lab Results  Component Value Date   WBC 3.6 (L) 05/23/2017   HGB 15.1 05/23/2017   HCT 44.7 05/23/2017   MCV 94.5 05/23/2017   PLT 154 05/23/2017    Lab Results  Component Value Date   CREATININE  1.02 05/23/2017   BUN 16 05/23/2017   NA 141 05/23/2017   K 3.7 05/23/2017   CL 106 05/23/2017   CO2 30 05/23/2017    Lab Results  Component Value Date   ALT 27 05/23/2017   AST 51 (H) 05/23/2017   ALKPHOS 73 05/23/2017   BILITOT 0.8 05/23/2017    Lab Results  Component Value Date   CHOL 115 04/24/2014   HDL 32 (L) 04/24/2014   LDLCALC 30 04/24/2014   TRIG 263 (H) 04/24/2014   CHOLHDL 3.6 04/24/2014   Lab Results  Component Value Date   LABRPR REACTIVE (A) 03/26/2015  RPRTITER 1:64 (A) 03/26/2015   HIV 1 RNA Quant (copies/mL)  Date Value  03/26/2015 44 (H)  12/17/2014 <20  06/10/2014 79 (H)   CD4 T Cell Abs (/uL)  Date Value  03/26/2015 480  12/17/2014 300 (L)  04/24/2014 200 (L)     Problem List Items Addressed This Visit      High   Human immunodeficiency virus (HIV) disease ([place])    I will get lab work today and have him restart Descovy and Prezcobix. He will take it after he gets off of work each evening. I asked him to set his cell phone alarm to remind him. Also had him meet with our ID pharmacist to talk about adherence and set up his prescription with his pharmacy. Also talked him about the importance of avoiding exposure to other strains of HIV which may be multidrug resistant. He will follow-up for repeat lab work in 6 weeks.      Relevant Orders   T-helper cell (CD4)- ([facility] clinic only)   HIV 1 RNA quant-no reflex-bld   CBC   Comprehensive metabolic panel   RPR   Urine cytology ancillary only   Cytology (oral, anal, urethral) ancillary only   Cytology (oral, anal, urethral) ancillary only     Unprioritized   Depression   Genital warts    I instructed him on how to use Aldara cream. I will screen him for other STDs and get him set up for the anal Pap clinic.      IVDU (intravenous drug user)    I have him meet with Sande Rives our mental health and substance abuse counselor today.      Relevant Orders   Hepatitis C Antibody         Michel Bickers, MD [facility] [place] [phone] pager   [phone] cell 06/27/2017, 10:22 AM

## 2017-06-27 NOTE — Assessment & Plan Note (Signed)
I will get lab work today and have him restart Descovy and Prezcobix. He will take it after he gets off of work each evening. I asked him to set his cell phone alarm to remind him. Also had him meet with our ID pharmacist to talk about adherence and set up his prescription with his pharmacy. Also talked him about the importance of avoiding exposure to other strains of HIV which may be multidrug resistant. He will follow-up for repeat lab work in 6 weeks.

## 2017-06-27 NOTE — Assessment & Plan Note (Signed)
I have him meet with Vergia AlbertsSherry Royster our mental health and substance abuse counselor today.

## 2017-06-27 NOTE — Assessment & Plan Note (Signed)
I instructed him on how to use Aldara cream. I will screen him for other STDs and get him set up for the anal Pap clinic.

## 2017-06-28 LAB — COMPREHENSIVE METABOLIC PANEL
ALBUMIN: 4 g/dL (ref 3.6–5.1)
ALK PHOS: 73 U/L (ref 40–115)
ALT: 15 U/L (ref 9–46)
AST: 18 U/L (ref 10–40)
BUN: 11 mg/dL (ref 7–25)
CHLORIDE: 106 mmol/L (ref 98–110)
CO2: 25 mmol/L (ref 20–32)
CREATININE: 0.77 mg/dL (ref 0.60–1.35)
Calcium: 8.8 mg/dL (ref 8.6–10.3)
Glucose, Bld: 73 mg/dL (ref 65–99)
POTASSIUM: 3.8 mmol/L (ref 3.5–5.3)
SODIUM: 142 mmol/L (ref 135–146)
TOTAL PROTEIN: 7.1 g/dL (ref 6.1–8.1)
Total Bilirubin: 0.5 mg/dL (ref 0.2–1.2)

## 2017-06-28 LAB — FLUORESCENT TREPONEMAL AB(FTA)-IGG-BLD: Fluorescent Treponemal ABS: REACTIVE — AB

## 2017-06-28 LAB — T-HELPER CELL (CD4) - (RCID CLINIC ONLY)
CD4 % Helper T Cell: 22 % — ABNORMAL LOW (ref 33–55)
CD4 T CELL ABS: 250 /uL — AB (ref 400–2700)

## 2017-06-28 LAB — RPR: RPR: REACTIVE — AB

## 2017-06-28 LAB — RPR TITER: RPR Titer: 1:2 {titer}

## 2017-06-28 LAB — HEPATITIS C ANTIBODY: HCV AB: NONREACTIVE

## 2017-06-29 LAB — CYTOLOGY, (ORAL, ANAL, URETHRAL) ANCILLARY ONLY
CHLAMYDIA, DNA PROBE: NEGATIVE
Chlamydia: NEGATIVE
NEISSERIA GONORRHEA: NEGATIVE
Neisseria Gonorrhea: NEGATIVE

## 2017-06-29 LAB — URINE CYTOLOGY ANCILLARY ONLY
Chlamydia: NEGATIVE
NEISSERIA GONORRHEA: NEGATIVE

## 2017-06-29 LAB — HIV-1 RNA QUANT-NO REFLEX-BLD
HIV 1 RNA Quant: 3850 copies/mL — ABNORMAL HIGH
HIV-1 RNA Quant, Log: 3.59 Log copies/mL — ABNORMAL HIGH

## 2017-06-30 NOTE — BH Specialist Note (Signed)
Integrated Behavioral Health Initial Visit  MRN: [id number] Name: Gabriel [name]   Session Start time: 10:10 am Session End time: 10:40 am Total time: 30 minutes  Type of Service: Integrated Behavioral Health- Individual/Family Interpretor:No. Interpretor Name and Language: N/A   Warm Hand Off Completed.       SUBJECTIVE: Gabriel [name] is a 41 y.o. male accompanied by patient. Patient was referred by Dr. Orvan Falconerampbell for substance use and related mood disorder.  Patient reports the following symptoms/concerns: Patient reported first use of Crystal Methamphetamine in 2004, initially smoking it but advancing to currently injecting it, with last use 2 months ago.  Patient reported that he binge uses when he is drinking Alcohol.  Patient reported that he is using clean needles but later contradicted himself.  Patient denied using other substances except Alcohol, which he uses about ten times a month.  Patient was administered the DAST-20 and answered yes to questions about feeling guilty about his use and getting into fights under the influence.  Patient shared the resulting effects of substance withdrawal and his need for 2-3 days of sleep.  Patient also reported engaging in unsafe sex practices under the influence.  Patient reported prior history of behavioral health services at age 41 due to his father killing his mother and himself.  Patient reported that he was informed that he may have Bipolar Disorder due to having a manic episode.  Due to patient's inability to attend treatment during the day and [place], [facility] with the number to [facility] to locate treatment facilities in [place].  [facility] also provided him with the contact information to [facility] for crisis center and [facility] for mobile crisis services.  [facility] also provided patient with multiple listings for syringe exchange programs throughout the state.  Duration of  problem: 14 years; Severity of problem: mild  OBJECTIVE: Mood: Elevated and Affect: Inappropriate Risk of harm to self or others: No plan to harm self or others   LIFE CONTEXT: Family and Social: Patient lives in [place] in [place]. School/Work: Patient works 12-9 pm and can only attend treatment in the mornings Self-Care: Patient is able to tend to his ADL's and is treatment compliant. Life Changes: Patient binge-uses Crystal Methamphetamine  ASSESSMENT: Patient is currently experiencing Methamphetamine use and possible mania symptoms and may benefit from assessment and treatment for substance use and behavioral health services.  GOALS ADDRESSED: Patient will reduce symptoms of: substance use and mania and increase knowledge and/or ability of: coping skills, healthy habits and self-management skills and also: Increase healthy adjustment to current life circumstances, Increase adequate support systems for patient/family and Decrease self-medicating behaviors  INTERVENTIONS: Motivational Interviewing and Link to [facility]  Standardized Assessments completed: DAST-20 scored 2, low problem severity evidencing need for brief counseling or Level 1 treatment.  PLAN: 1. Behavioral recommendations: Patient will make contact with [facility] to locate substance use treatment in [place] and contact [facility] for behavioral health services assessment and treatment. 2. Referral(s): [facility] ([facility]Outside [facility]) and Substance Abuse Program   [place]and[place]herry Tarsha [place], [place]

## 2017-07-13 ENCOUNTER — Emergency Department
Admission: EM | Admit: 2017-07-13 | Discharge: 2017-07-13 | Disposition: A | Discharge status: Home / Self Care | Payer: BLUE CROSS/BLUE SHIELD | Attending: Emergency Medicine | Admitting: Emergency Medicine

## 2017-07-13 ENCOUNTER — Encounter: Payer: Self-pay | Admitting: Medical Oncology

## 2017-07-13 DIAGNOSIS — B20 Human immunodeficiency virus [HIV] disease: Secondary | ICD-10-CM | POA: Insufficient documentation

## 2017-07-13 DIAGNOSIS — F17210 Nicotine dependence, cigarettes, uncomplicated: Secondary | ICD-10-CM | POA: Diagnosis not present

## 2017-07-13 DIAGNOSIS — H00015 Hordeolum externum left lower eyelid: Secondary | ICD-10-CM | POA: Diagnosis not present

## 2017-07-13 DIAGNOSIS — Z79899 Other long term (current) drug therapy: Secondary | ICD-10-CM | POA: Diagnosis not present

## 2017-07-13 DIAGNOSIS — H5712 Ocular pain, left eye: Secondary | ICD-10-CM | POA: Diagnosis present

## 2017-07-13 MED ORDER — GENTAMICIN SULFATE 0.3 % OP SOLN: 1.0000 [drp] | OPHTHALMIC | 0 refills | Status: DC

## 2017-07-13 MED ORDER — HYDROXYZINE HCL 50 MG PO TABS: 50.0000 mg | ORAL_TABLET | Freq: Three times a day (TID) | ORAL | 0 refills | Status: DC | PRN

## 2017-07-13 NOTE — Discharge Instructions (Signed)
Apply cooled compresses to the left inferior periorbital area 3 times a day for 2-3 days as needed for swelling.

## 2017-07-13 NOTE — ED Provider Notes (Signed)
[facility] Emergency Department Provider Note   ____________________________________________   First MD Initiated Contact with Patient 07/13/17 [phone]     (approximate)  I have reviewed the triage vital signs and the nursing notes.   HISTORY  Chief Complaint Eye Problem    HPI ALIN CHAVIRA is a 41 y.o. male patient complaining of left eye pain with swelling to the lower eyelid for 3 days. Patient denies vision disturbance.Patient rates the pain as 5/10. Patient describes pain as "achy". No palliative measures for complaint. Patient not wear contact lenses.   Past Medical History:  Diagnosis Date  . Cellulitis   . HIV positive ([facility])   . MRSA infection     Patient Active Problem List   Diagnosis Date Noted  . Genital warts 06/27/2017  . IVDU (intravenous drug user) 06/27/2017  . Depression 06/27/2017  . Hematochezia 03/26/2015  . Dermatitis 06/10/2014  . Dyslipidemia 04/14/2011  . Blood in stool 03/20/2009  . 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 DENTAL CARIES 01/02/2009  . TOBACCO USER 05/27/2008  . INSOMNIA, CHRONIC 05/27/2008  . Human immunodeficiency virus (HIV) disease ([facility]) 11/28/2006  . SYPHILIS NOS 11/28/2006    Past Surgical History:  Procedure Laterality Date  . HAND / FINGER LESION EXCISION      Prior to Admission medications   Medication Sig Start Date End Date Taking? Authorizing Provider  ciprofloxacin (CIPRO) 500 MG tablet Take 1 tablet (500 mg total) by mouth 2 (two) times daily. Patient not taking: Reported on 06/27/2017 04/24/16   Sharman Cheek, MD  cyclobenzaprine (FLEXERIL) 10 MG tablet Take 1 tablet (10 mg total) by mouth 3 (three) times daily as needed for muscle spasms. Patient not taking: Reported on 06/27/2017 12/04/15   Hagler, Jami L, PA-C  darunavir-cobicistat (PREZCOBIX) 800-150 MG per tablet Take 1 tablet by mouth daily. Swallow whole. Do NOT crush, break or chew tablets. Take with  food. Patient not taking: Reported on 06/27/2017 01/06/15   Cliffton Asters, MD  darunavir-cobicistat (PREZCOBIX) 800-150 MG tablet Take 1 tablet by mouth daily with supper. Swallow whole. Do NOT crush, break or chew tablets. Take with food. 06/27/17   Cliffton Asters, MD  emtricitabine-tenofovir AF (DESCOVY) 200-25 MG per tablet Take 1 tablet by mouth daily. Patient not taking: Reported on 06/27/2017 05/13/15   Cliffton Asters, MD  emtricitabine-tenofovir AF (DESCOVY) 200-25 MG tablet Take 1 tablet by mouth daily. 06/27/17   Cliffton Asters, MD  gentamicin (GARAMYCIN) 0.3 % ophthalmic solution Place 1 drop into the left eye every 4 (four) hours. 07/13/17   Joni Reining, PA-C  hydrocortisone 1 % ointment Apply 1 application topically 2 (two) times daily. Patient not taking: Reported on 03/26/2015 06/10/14   Cliffton Asters, MD  hydrOXYzine (ATARAX/VISTARIL) 50 MG tablet Take 1 tablet (50 mg total) by mouth 3 (three) times daily as needed. 07/13/17   Joni Reining, PA-C  ibuprofen (ADVIL,MOTRIN) 800 MG tablet Take 1 tablet (800 mg total) by mouth every 8 (eight) hours as needed (pain). Patient not taking: Reported on 06/27/2017 12/04/15   Hagler, Jami L, PA-C  imiquimod Math[facility] Dad) 5 % cream Apply to affected area three times weekly Patient not taking: Reported on 06/27/2017 05/23/17 05/23/18  Phineas Semen, MD  magic mouthwash SOLN Take 10 mLs by mouth 3 (three) times daily as needed for mouth pain. Patient not taking: Reported on 06/27/2017 09/07/15   Irean Hong, MD  nicotine ([employer] NICOTINE TRANSDERMAL SYS) 14 mg/24hr patch Place 1  patch (14 mg total) onto the skin daily. Patient not taking: Reported on 01/06/2015 11/19/13   Ginnie Smart, MD  ondansetron (ZOFRAN) 4 MG tablet Take 1 tablet (4 mg total) by mouth every 6 (six) hours as needed for nausea or vomiting. Patient not taking: Reported on 06/27/2017 04/24/16   Sharman Cheek, MD  oxyCODONE-acetaminophen (ROXICET) 5-325 MG tablet Take 1 tablet by mouth every 6  (six) hours as needed for severe pain. Patient not taking: Reported on 06/27/2017 04/24/16   Sharman Cheek, MD  penicillin v potassium (VEETID) 500 MG tablet Take 1 tablet (500 mg total) by mouth 4 (four) times daily. Patient not taking: Reported on 06/27/2017 12/21/16   Menshew, Charlesetta Ivory, PA-C  polyethylene glycol [facility]) packet Take 17 g by mouth daily. Patient not taking: Reported on 06/27/2017 05/23/17   Phineas Semen, MD  traMADol (ULTRAM) 50 MG tablet Take 1 tablet (50 mg total) by mouth 3 (three) times daily as needed. Patient not taking: Reported on 06/27/2017 12/21/16   Menshew, Charlesetta Ivory, PA-C    Allergies Abacavir  Family History  Problem Relation Age of Onset  . Hypertension Mother   . Cancer Maternal Grandmother   . Crohn's disease Brother     Social History Social History  Substance Use Topics  . Smoking status: Current Every Day Smoker    Packs/day: 1.00    Types: Cigarettes  . Smokeless tobacco: Never Used  . Alcohol use 1.8 oz/week    3 Standard drinks or equivalent per week     Comment: socially     Review of Systems  Constitutional: No fever/chills Eyes: No visual changes. ENT: No sore throat. Cardiovascular: Denies chest pain. Respiratory: Denies shortness of breath. Gastrointestinal: No abdominal pain.  No nausea, no vomiting.  No diarrhea.  No constipation. Genitourinary: Negative for dysuria. Musculoskeletal: Negative for back pain. Skin: Negative for rash. Neurological: Negative for headaches, focal weakness or numbness. Psychiatric:Hyperlipidemia Endocrine:Anxiety Allergic/Immunilogical: HIV  ____________________________________________   PHYSICAL EXAM:  VITAL SIGNS: ED Triage Vitals [07/13/17 0904]  Enc Vitals Group     BP 115/89     Pulse Rate 89     Resp 18     Temp 98.1 F (36.7 C)     Temp Source Oral     SpO2 97 %     Weight 186 lb (84.4 kg)     Height  (1.981 m)     Head Circumference      Peak Flow       Pain Score 5     Pain Loc      Pain Edu?      Excl. in GC?     Constitutional: Alert and oriented. Well appearing and in no acute distress. Eyes: Conjunctivae are normal. PERRL. EOMI. Erythematous papular lesion left lower lid. Mild left inferior periorbital edema. Cardiovascular: Normal rate, regular rhythm. Grossly normal heart sounds.  Good peripheral circulation. Respiratory: Normal respiratory effort.  No retractions. Lungs CTAB. Neurologic:  Normal speech and language. No gross focal neurologic deficits are appreciated. No gait instability. Skin:  Skin is warm, dry and intact. No rash noted. Psychiatric: Mood and affect are normal. Speech and behavior are normal.  ____________________________________________   LABS (all labs ordered are listed, but only abnormal results are displayed)  Labs Reviewed - No data to display ____________________________________________  EKG   ____________________________________________  RADIOLOGY  No results found.  ____________________________________________   PROCEDURES  Procedure(s) performed: None  Procedures  Critical Care  performed: No  ____________________________________________   INITIAL IMPRESSION / ASSESSMENT AND PLAN / ED COURSE  Pertinent labs & imaging results that were available during my care of the patient were reviewed by me and considered in my medical decision making (see chart for details).  Left eye pain and edema secondary to this type. Patient given discharge care instructions. Patient advised use eyedrops and oral medication as directed. Patient advised to follow-up with the [facility] condition persists.      ____________________________________________   FINAL CLINICAL IMPRESSION(S) / ED DIAGNOSES  Final diagnoses:  Hordeolum externum of left lower eyelid      NEW MEDICATIONS STARTED DURING THIS VISIT:  New Prescriptions   GENTAMICIN (GARAMYCIN) 0.3 % OPHTHALMIC SOLUTION     Place 1 drop into the left eye every 4 (four) hours.   HYDROXYZINE (ATARAX/VISTARIL) 50 MG TABLET    Take 1 tablet (50 mg total) by mouth 3 (three) times daily as needed.     Note:  This document was prepared using Dragon voice recognition software and may include unintentional dictation errors.    Joni Reining, PA-C 07/13/17 1610    Jene Every, MD 07/13/17 1048

## 2017-07-13 NOTE — ED Triage Notes (Signed)
Left eye pain with swelling to lower lid x 3 days.

## 2017-07-13 NOTE — ED Notes (Signed)
See triage note  States he developed some swelling to left lower eyelid about 3 days ago   Also noticed some swelling under eye

## 2017-08-01 ENCOUNTER — Telehealth: Payer: Self-pay

## 2017-08-01 MED FILL — DESCOVY 200-25 MG TABS: 200-25 | 30 days supply | Qty: 30 | Fill #1

## 2017-08-01 MED FILL — PREZCOBIX 800 MG-150 MG TAB: 800-150 | 30 days supply | Qty: 30 | Fill #1

## 2017-08-01 NOTE — Telephone Encounter (Signed)
Called Mr. Incorvaia in order to schedule an appointment with Dr. Ninetta Lights for a HRA. Left a voicemail instructing the pt to call back to schedule that appt.  Next HRA Clinic is 08/11/17 30 min appt. Lorenso Courier, [place]

## 2017-08-03 NOTE — Telephone Encounter (Addendum)
Patient called back and scheduled for 08/11/17. Wendall Mola CMA

## 2017-08-08 ENCOUNTER — Ambulatory Visit: Payer: BLUE CROSS/BLUE SHIELD | Admitting: Internal Medicine

## 2017-08-11 ENCOUNTER — Encounter: Payer: Self-pay | Admitting: *Deleted

## 2017-08-11 ENCOUNTER — Ambulatory Visit (INDEPENDENT_AMBULATORY_CARE_PROVIDER_SITE_OTHER): Payer: BLUE CROSS/BLUE SHIELD | Admitting: Infectious Diseases

## 2017-08-11 DIAGNOSIS — A630 Anogenital (venereal) warts: Secondary | ICD-10-CM

## 2017-08-11 NOTE — Assessment & Plan Note (Signed)
Will have him eval by Dr Glorious Peachhomas/Gross at [facility].  He was intolerant of anoscopy under local anesthesia.

## 2017-08-11 NOTE — Progress Notes (Signed)
  41 yo M with hx of hIV+, on prezcobix, descovy. Has been giving GI issues, urge/constipation.  Here for HRA. Has not had prev. Had colonoscopy before.  Has been off aldara, "wa so uncomfortable...burning". Used neosprin instead.   PRE-OPERATIVE DIAGNOSIS:  Anal condyloma, HRA with bx  POST-OPERATIVE DIAGNOSIS:  Anal condyloma  PROCEDURE:    SURGEON:  Gerica Koble  ASSISTANT: Poole/Maldonado  ANESTHESIA:   local  EBL: < 10 cc   SPECIMEN:  No Specimen  DISPOSITION OF SPECIMEN:  N/A  COUNTS:  YES  PLAN OF CARE: home  PATIENT DISPOSITION:  home  INDICATION: Condyloma  OR FINDINGS:  Multiple external condyloma. Pt did not tolerate internal exam. There was minimal bleeding, controlled with application of gauze.   DESCRIPTION: The patient was identified in the waiting area and taken to the exam room where they were laid on the table in the lateral decubitus position.  The patient was then prepped and draped in the usual fashion. A surgical timeout was performed indicating the correct patient, procedure, positioning. Exam terminated upon trying to insert speculum.    PLAN:  Will refer pt to surgery for exam under anesthesia. He was intolerant of local exam.

## 2017-08-18 ENCOUNTER — Telehealth: Payer: Self-pay | Admitting: *Deleted

## 2017-08-18 NOTE — Telephone Encounter (Signed)
Patient called for advice. He has had bright red blood per rectum and reports a high amount of pain while trying to pass stool. He states this has been happening since the HRA attempt last weekend. He denies blood at any other time, only when trying to pass stool. He states it "drips into the toilet bowl." He states he has been taking anxiety pills before using the bathroom because he is "so worried about the pain."  [facility] requesting first available.  Please advise.  Andree CossHowell, Jakyrah Holladay M, RN

## 2017-08-18 NOTE — Telephone Encounter (Signed)
Encourage him to f/u at [facility].  thanks

## 2017-08-18 NOTE — Telephone Encounter (Signed)
Patient will call [facility] to see the status of his referral.

## 2017-08-23 MED FILL — PREZCOBIX 800 MG-150 MG TAB: 800-150 | 30 days supply | Qty: 30 | Fill #2

## 2017-08-23 MED FILL — DESCOVY 200-25 MG TABS: 200-25 | 30 days supply | Qty: 30 | Fill #2

## 2017-08-29 ENCOUNTER — Emergency Department
Admission: EM | Admit: 2017-08-29 | Discharge: 2017-08-29 | Disposition: A | Discharge status: Home / Self Care | Payer: BLUE CROSS/BLUE SHIELD | Attending: Emergency Medicine | Admitting: Emergency Medicine

## 2017-08-29 ENCOUNTER — Encounter: Payer: Self-pay | Admitting: Emergency Medicine

## 2017-08-29 ENCOUNTER — Other Ambulatory Visit: Payer: Self-pay

## 2017-08-29 DIAGNOSIS — F17210 Nicotine dependence, cigarettes, uncomplicated: Secondary | ICD-10-CM | POA: Insufficient documentation

## 2017-08-29 DIAGNOSIS — Z79899 Other long term (current) drug therapy: Secondary | ICD-10-CM | POA: Insufficient documentation

## 2017-08-29 DIAGNOSIS — J040 Acute laryngitis: Secondary | ICD-10-CM | POA: Insufficient documentation

## 2017-08-29 DIAGNOSIS — R490 Dysphonia: Secondary | ICD-10-CM | POA: Diagnosis not present

## 2017-08-29 DIAGNOSIS — J029 Acute pharyngitis, unspecified: Secondary | ICD-10-CM | POA: Diagnosis not present

## 2017-08-29 DIAGNOSIS — B20 Human immunodeficiency virus [HIV] disease: Secondary | ICD-10-CM | POA: Insufficient documentation

## 2017-08-29 DIAGNOSIS — R05 Cough: Secondary | ICD-10-CM

## 2017-08-29 DIAGNOSIS — R059 Cough, unspecified: Secondary | ICD-10-CM

## 2017-08-29 MED ORDER — BENZONATATE 100 MG PO CAPS
100.0000 mg | ORAL_CAPSULE | Freq: Once | ORAL | Status: AC
  Administered 2017-08-29: 100 mg via ORAL
  Filled 2017-08-29 (×2): qty 1

## 2017-08-29 MED ORDER — MAGIC MOUTHWASH: 240.0000 mL | Freq: Three times a day (TID) | ORAL | 0 refills | Status: DC | PRN

## 2017-08-29 MED ORDER — PREDNISOLONE SODIUM PHOSPHATE 15 MG/5ML PO SOLN
15.0000 mg | Freq: Once | ORAL | Status: AC
  Administered 2017-08-29: 15 mg via ORAL
  Filled 2017-08-29: qty 1

## 2017-08-29 MED ORDER — LIDOCAINE VISCOUS 2 % MT SOLN
15.0000 mL | Freq: Once | OROMUCOSAL | Status: AC
  Administered 2017-08-29: 15 mL via OROMUCOSAL
  Filled 2017-08-29: qty 15

## 2017-08-29 MED ORDER — BENZONATATE 100 MG PO CAPS: 100.0000 mg | ORAL_CAPSULE | Freq: Two times a day (BID) | ORAL | 0 refills | Status: DC

## 2017-08-29 MED ORDER — AZITHROMYCIN 250 MG PO TABS: ORAL_TABLET | ORAL | 0 refills | Status: DC

## 2017-08-29 NOTE — ED Provider Notes (Signed)
[facility] Emergency Department Provider Note   ____________________________________________   I have reviewed the triage vital signs and the nursing notes.   HISTORY  Chief Complaint Laryngitis    HPI Gabriel [name] is a 41 y.o. male presents emergency department with cough, bronchial irritation, sore throat and hoarseness for 1 week. Patient reports similar symptoms about 1 year ago when he had laryngitis. Patient is HIV+ and reports compliance with all care at this time. Patient states being out of care for 1 year but has been consistent with medications during 2018. Patients last CD4 count 240 compared to 480 in 2016. His current viral RNA viral load 3,850. Patient denies fever, chills, nausea, vomiting, headache, malaise, shortness of breath or difficulty breathing since onset of symptoms.    Past Medical History:  Diagnosis Date  . Cellulitis   . HIV positive (HCC)   . MRSA infection     Patient Active Problem List   Diagnosis Date Noted  . Genital warts 06/27/2017  . IVDU (intravenous drug user) 06/27/2017  . Depression 06/27/2017  . Hematochezia 03/26/2015  . Dermatitis 06/10/2014  . Dyslipidemia 04/14/2011  . Blood in stool 03/20/2009  . 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 DENTAL CARIES 01/02/2009  . TOBACCO USER 05/27/2008  . INSOMNIA, CHRONIC 05/27/2008  . Human immunodeficiency virus (HIV) disease (HCC) 11/28/2006  . SYPHILIS NOS 11/28/2006    Past Surgical History:  Procedure Laterality Date  . HAND / FINGER LESION EXCISION      Prior to Admission medications   Medication Sig Start Date End Date Taking? Authorizing Provider  azithromycin (ZITHROMAX Z-PAK) 250 MG tablet Take 2 tablets (500 mg) on  Day 1,  followed by 1 tablet (250 mg) once daily on Days 2 through 5. 08/29/17 09/03/17  Mariaclara Spear M, PA-C  benzonatate (TESSALON PERLES) 100 MG capsule Take 1 capsule (100 mg total) 2 (two) times daily by  mouth. 08/29/17 08/29/18  Pharaoh Pio M, PA-C  ciprofloxacin (CIPRO) 500 MG tablet Take 1 tablet (500 mg total) by mouth 2 (two) times daily. Patient not taking: Reported on 06/27/2017 04/24/16   Sharman CheekStafford, Phillip, MD  cyclobenzaprine (FLEXERIL) 10 MG tablet Take 1 tablet (10 mg total) by mouth 3 (three) times daily as needed for muscle spasms. Patient not taking: Reported on 06/27/2017 12/04/15   Hagler, Jami L, PA-C  darunavir-cobicistat (PREZCOBIX) 800-150 MG per tablet Take 1 tablet by mouth daily. Swallow whole. Do NOT crush, break or chew tablets. Take with food. Patient not taking: Reported on 06/27/2017 01/06/15   Cliffton Astersampbell, John, MD  darunavir-cobicistat (PREZCOBIX) 800-150 MG tablet Take 1 tablet by mouth daily with supper. Swallow whole. Do NOT crush, break or chew tablets. Take with food. 06/27/17   Cliffton Astersampbell, John, MD  emtricitabine-tenofovir AF (DESCOVY) 200-25 MG per tablet Take 1 tablet by mouth daily. Patient not taking: Reported on 06/27/2017 05/13/15   Cliffton Astersampbell, John, MD  emtricitabine-tenofovir AF (DESCOVY) 200-25 MG tablet Take 1 tablet by mouth daily. 06/27/17   Cliffton Astersampbell, John, MD  gentamicin (GARAMYCIN) 0.3 % ophthalmic solution Place 1 drop into the left eye every 4 (four) hours. 07/13/17   Joni ReiningSmith, Ronald K, PA-C  hydrocortisone 1 % ointment Apply 1 application topically 2 (two) times daily. Patient not taking: Reported on 03/26/2015 06/10/14   Cliffton Astersampbell, John, MD  hydrOXYzine (ATARAX/VISTARIL) 50 MG tablet Take 1 tablet (50 mg total) by mouth 3 (three) times daily as needed. 07/13/17   Joni ReiningSmith, Ronald K, PA-C  ibuprofen (ADVIL,MOTRIN) 800 MG tablet Take 1 tablet (800 mg total) by mouth every 8 (eight) hours as needed (pain). Patient not taking: Reported on 06/27/2017 12/04/15   Hagler, Jami L, PA-C  imiquimod [facility]) 5 % cream Apply to affected area three times weekly Patient not taking: Reported on 06/27/2017 05/23/17 05/23/18  Phineas Semen, MD  magic mouthwash SOLN Take 240 mLs 3 (three) times daily  as needed by mouth for mouth pain. 08/29/17   Brookley Spitler M, PA-C  nicotine ([employer] NICOTINE TRANSDERMAL SYS) 14 mg/24hr patch Place 1 patch (14 mg total) onto the skin daily. Patient not taking: Reported on 01/06/2015 11/19/13   Ginnie Smart, MD  ondansetron (ZOFRAN) 4 MG tablet Take 1 tablet (4 mg total) by mouth every 6 (six) hours as needed for nausea or vomiting. Patient not taking: Reported on 06/27/2017 04/24/16   Sharman Cheek, MD  oxyCODONE-acetaminophen (ROXICET) 5-325 MG tablet Take 1 tablet by mouth every 6 (six) hours as needed for severe pain. Patient not taking: Reported on 06/27/2017 04/24/16   Sharman Cheek, MD  penicillin v potassium (VEETID) 500 MG tablet Take 1 tablet (500 mg total) by mouth 4 (four) times daily. Patient not taking: Reported on 06/27/2017 12/21/16   Menshew, Charlesetta Ivory, PA-C  polyethylene glycol [facility]) packet Take 17 g by mouth daily. Patient not taking: Reported on 06/27/2017 05/23/17   Phineas Semen, MD  traMADol (ULTRAM) 50 MG tablet Take 1 tablet (50 mg total) by mouth 3 (three) times daily as needed. Patient not taking: Reported on 06/27/2017 12/21/16   Menshew, Charlesetta Ivory, PA-C    Allergies Abacavir  Family History  Problem Relation Age of Onset  . Hypertension Mother   . Cancer Maternal Grandmother   . Crohn's disease Brother     Social History Social History   Tobacco Use  . Smoking status: Current Every Day Smoker    Packs/day: 1.00    Types: Cigarettes  . Smokeless tobacco: Never Used  Substance Use Topics  . Alcohol use: Yes    Alcohol/week: 1.8 oz    Types: 3 Standard drinks or equivalent per week    Comment: socially   . Drug use: No    Review of Systems Constitutional: No fever/chills Eyes: No visual changes. ENT: Positive for sore throat, hoarseness and throat irritation from persistent cough. Cardiovascular: Denies chest pain. Respiratory: Positive for nonproductive cough. Gastrointestinal: No abdominal pain.   No nausea, no vomiting.   Skin: Negative for rash. Neurological: Negative for headaches  ____________________________________________   PHYSICAL EXAM:  VITAL SIGNS: ED Triage Vitals [08/29/17 1714]  Enc Vitals Group     BP 100/87     Pulse Rate 95     Resp 16     Temp 98.2 F (36.8 C)     Temp Source Oral     SpO2 100 %     Weight 195 lb (88.5 kg)     Height 6\' 6"  (1.981 m)     Head Circumference      Peak Flow      Pain Score      Pain Loc      Pain Edu?      Excl. in GC?     Constitutional: Alert and oriented. Well appearing and in no acute distress. Eyes: Conjunctivae are normal. Head: Atraumatic. Nose: No congestion/rhinnorhea. Mouth/Throat: Mucous membranes are moist.  Oropharynx not erythematous or edematous.  No exudate noted.  Negative for white plaque/thrush.  Negative for tonsillar  swelling, muffled voice.  Uvula midline.  Voice with hoarseness. Cardiovascular: Normal rate, regular rhythm. Good peripheral circulation. Respiratory: Normal respiratory effort.  No retractions. Lungs CTAB. No wheezing, rhonchi or rubs. Gastrointestinal: Soft and nontender. No distention. No abdominal bruits. No CVA tenderness. Neurologic:  Normal speech and language. No gross focal neurologic deficits are appreciated.  Skin:  Skin is warm, dry and intact. No rash noted. Psychiatric: Mood and affect are normal. Speech and behavior are normal.  ____________________________________________   LABS (all labs ordered are listed, but only abnormal results are displayed)  Labs Reviewed - No data to display ____________________________________________  EKG none  ____________________________________________  RADIOLOGY none ____________________________________________   PROCEDURES  Procedure(s) performed: no    Critical Care performed: no ____________________________________________   INITIAL IMPRESSION / ASSESSMENT AND PLAN / ED COURSE  Pertinent labs & imaging  results that were available during my care of the patient were reviewed by me and considered in my medical decision making (see chart for details).   Patient presents to emergency department with cough, bronchial irritation, sore throat and hoarseness for 1 week. History and physical exam findings are consistent with laryngitis and likely upper respiratory infection. Patient decreased symptoms following viscous lidocaine/prednisolone gargle and tessalon perles given during the course of care in the emergency department. Patient will be prescribed magic mouth wash, azithromycin and tessalon perles. Patient advised to follow up with infectious disease provider if symptoms do not fully resolve or return to the emergency department if symptoms significantly worsen. Patient informed of clinical course, understand medical decision-making process, and agree with plan.   ____________________________________________   FINAL CLINICAL IMPRESSION(S) / ED DIAGNOSES  Final diagnoses:  Cough  Hoarseness  Laryngitis  Sore throat      NEW MEDICATIONS STARTED DURING THIS VISIT:  This SmartLink is deprecated. Use AVSMEDLIST instead to display the medication list for a patient.   Note:  This document was prepared using Dragon voice recognition software and may include unintentional dictation errors.   Clois ComberLittle, Dannisha Eckmann M, PA-C 08/29/17 1857    Emily FilbertWilliams, Jonathan E, MD 08/30/17 [phone]

## 2017-08-29 NOTE — ED Triage Notes (Signed)
Pt in via POV with complaints of laryngitis since Sunday and cough x approximately one week.  Pt denies any pain, states, "I feel fine otherwise."  NAD noted at this time.

## 2017-08-29 NOTE — Discharge Instructions (Signed)
Take medication as prescribed. Return to emergency department if symptoms worsen and follow-up with PCP as needed.   °

## 2017-08-30 ENCOUNTER — Other Ambulatory Visit: Payer: Self-pay | Admitting: Pharmacist Clinician (PhC)/ Clinical Pharmacy Specialist

## 2017-08-30 MED ORDER — AZITHROMYCIN 250 MG PO TABS: ORAL_TABLET | ORAL | 0 refills | Status: AC

## 2017-08-30 MED ORDER — BENZONATATE 100 MG PO CAPS: 100.0000 mg | ORAL_CAPSULE | Freq: Two times a day (BID) | ORAL | 0 refills | Status: DC

## 2017-08-30 MED ORDER — MAGIC MOUTHWASH: 10.0000 mL | Freq: Three times a day (TID) | ORAL | 0 refills | Status: DC | PRN

## 2017-08-30 MED ORDER — AZITHROMYCIN 250 MG PO TABS: ORAL_TABLET | ORAL | 0 refills | Status: DC

## 2017-08-30 MED FILL — BENZONATATE 100 MG CAPS: 100 | 15 days supply | Qty: 30 | Fill #0

## 2017-08-30 MED FILL — MAGIC MOUTHWASH BOP FORM: 8 days supply | Qty: 240 | Fill #0

## 2017-08-30 MED FILL — AZITHROMYCIN 250 MG TABLET: 250 | 5 days supply | Qty: 6 | Fill #0

## 2017-08-30 NOTE — Progress Notes (Signed)
[name] called us to let us know that he can't afford the abx that he was recently prescribed for cough/laryngitis/sore throat. SF will help him out with the copay therefore, he will pick up today.

## 2017-09-18 ENCOUNTER — Ambulatory Visit: Payer: Self-pay | Admitting: Surgery

## 2017-09-18 DIAGNOSIS — Z01818 Encounter for other preprocedural examination: Secondary | ICD-10-CM | POA: Diagnosis not present

## 2017-09-18 DIAGNOSIS — A630 Anogenital (venereal) warts: Secondary | ICD-10-CM | POA: Diagnosis not present

## 2017-09-18 DIAGNOSIS — Z720 Tobacco use: Secondary | ICD-10-CM | POA: Diagnosis not present

## 2017-09-21 ENCOUNTER — Other Ambulatory Visit: Payer: Self-pay | Admitting: Pharmacist Clinician (PhC)/ Clinical Pharmacy Specialist

## 2017-09-21 ENCOUNTER — Telehealth: Payer: Self-pay | Admitting: Pharmacist Clinician (PhC)/ Clinical Pharmacy Specialist

## 2017-09-21 NOTE — Telephone Encounter (Signed)
We got notified by the pharmacy to check up on Gabriel [name]'s compliance because he stated that he still have his ART supply but he should out at this point. Talked to him today about the issue. Due to has poor adherence hx, he still have plenty of the old supply on hand, therefore, he has been taking those. Bringing him back next week to check labs.

## 2017-09-25 ENCOUNTER — Ambulatory Visit: Payer: Self-pay | Admitting: Surgery

## 2017-09-28 ENCOUNTER — Ambulatory Visit: Payer: BLUE CROSS/BLUE SHIELD

## 2017-10-19 ENCOUNTER — Telehealth: Payer: Self-pay | Admitting: Pharmacist Clinician (PhC)/ Clinical Pharmacy Specialist

## 2017-10-19 NOTE — Telephone Encounter (Signed)
Need to bring back for an appt so we can do labs. VM is full so can't leave messages.

## 2017-11-06 ENCOUNTER — Other Ambulatory Visit: Payer: BLUE CROSS/BLUE SHIELD

## 2017-11-06 DIAGNOSIS — B20 Human immunodeficiency virus [HIV] disease: Secondary | ICD-10-CM

## 2017-11-08 LAB — T-HELPER CELL (CD4) - (RCID CLINIC ONLY)
CD4 T CELL HELPER: 20 % — AB (ref 33–55)
CD4 T Cell Abs: 460 /uL (ref 400–2700)

## 2017-11-08 LAB — HIV-1 RNA QUANT-NO REFLEX-BLD
HIV 1 RNA QUANT: 5370 {copies}/mL — AB
HIV-1 RNA Quant, Log: 3.73 Log copies/mL — ABNORMAL HIGH

## 2017-11-16 ENCOUNTER — Telehealth: Payer: Self-pay | Admitting: Pharmacist

## 2017-11-16 MED FILL — PREZCOBIX 800 MG-150 MG TAB: 800-150 | 30 days supply | Qty: 30 | Fill #3

## 2017-11-16 MED FILL — DESCOVY 200-25 MG TABS: 200-25 | 30 days supply | Qty: 30 | Fill #3

## 2017-11-16 NOTE — Telephone Encounter (Signed)
Gabriel [name] called wondering why his viral load has gone up.  He had labs drawn last week and his CD4 went from 250 to 460 and his HIV viral load went from 3850 to 5370.  He has had many problems with adherence in the past.  [facility] but the last time he filled was October 31.   I straight up told him that the only reason his viral load would have gone up is if he isn't taking his medications.  He swears he has taken them every day since restarting in October (only missed 1-2 days since then) and had some bottles "left over" so he did not refill them.  Only other explanation is if he has more resistance (already has K103N) but I don't think he is telling the truth.  He sees Dr. Orvan Falconerampbell next Thursday. I suggest getting a HIV viral load with reflex to genotype just to make sure, but I ultimately think he is not telling the truth.

## 2017-11-16 NOTE — Telephone Encounter (Signed)
I am in complete agreement.  I will repeat his viral load and check resistance assays at the time of his visit next week.

## 2017-11-23 ENCOUNTER — Ambulatory Visit (INDEPENDENT_AMBULATORY_CARE_PROVIDER_SITE_OTHER): Payer: BLUE CROSS/BLUE SHIELD | Admitting: Internal Medicine

## 2017-11-23 ENCOUNTER — Encounter: Payer: Self-pay | Admitting: Internal Medicine

## 2017-11-23 DIAGNOSIS — F3340 Major depressive disorder, recurrent, in remission, unspecified: Secondary | ICD-10-CM | POA: Diagnosis not present

## 2017-11-23 DIAGNOSIS — A630 Anogenital (venereal) warts: Secondary | ICD-10-CM

## 2017-11-23 DIAGNOSIS — B20 Human immunodeficiency virus [HIV] disease: Secondary | ICD-10-CM | POA: Diagnosis not present

## 2017-11-23 DIAGNOSIS — A539 Syphilis, unspecified: Secondary | ICD-10-CM | POA: Diagnosis not present

## 2017-11-23 NOTE — Assessment & Plan Note (Signed)
His depression is in remission, most likely because he is now sober and off of drugs.

## 2017-11-23 NOTE — Assessment & Plan Note (Signed)
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

## 2017-11-23 NOTE — Progress Notes (Signed)
        Patient Active Problem List   Diagnosis Date Noted  . Human immunodeficiency virus (HIV) disease (HCC) 11/28/2006    Priority: High  . Blood in stool 03/20/2009    Priority: Medium  . SYPHILIS NOS 11/28/2006    Priority: Medium  . Genital warts 06/27/2017  . IVDU (intravenous drug user) 06/27/2017  . Depression 06/27/2017  . Hematochezia 03/26/2015  . Dermatitis 06/10/2014  . Dyslipidemia 04/14/2011  . 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 DENTAL CARIES 01/02/2009  . TOBACCO USER 05/27/2008  . INSOMNIA, CHRONIC 05/27/2008    Patient's Medications  New Prescriptions   No medications on file  Previous Medications   BENZONATATE (TESSALON PERLES) 100 MG CAPSULE    Take 1 capsule (100 mg total) 2 (two) times daily by mouth.   DARUNAVIR-COBICISTAT (PREZCOBIX) 800-150 MG TABLET    Take 1 tablet by mouth daily with supper. Swallow whole. Do NOT crush, break or chew tablets. Take with food.   EMTRICITABINE-TENOFOVIR AF (DESCOVY) 200-25 MG TABLET    Take 1 tablet by mouth daily.   GENTAMICIN (GARAMYCIN) 0.3 % OPHTHALMIC SOLUTION    Place 1 drop into the left eye every 4 (four) hours.   IBUPROFEN (ADVIL,MOTRIN) 800 MG TABLET    Take 1 tablet (800 mg total) by mouth every 8 (eight) hours as needed (pain).   NICOTINE ([employer] NICOTINE TRANSDERMAL SYS) 14 MG/24HR PATCH    Place 1 patch (14 mg total) onto the skin daily.  Modified Medications   No medications on file  Discontinued Medications   CIPROFLOXACIN (CIPRO) 500 MG TABLET    Take 1 tablet (500 mg total) by mouth 2 (two) times daily.   CYCLOBENZAPRINE (FLEXERIL) 10 MG TABLET    Take 1 tablet (10 mg total) by mouth 3 (three) times daily as needed for muscle spasms.   HYDROCORTISONE 1 % OINTMENT    Apply 1 application topically 2 (two) times daily.   HYDROXYZINE (ATARAX/VISTARIL) 50 MG TABLET    Take 1 tablet (50 mg total) by mouth 3 (three) times daily as needed.   IMIQUIMOD (ALDARA) 5 % CREAM    Apply to  affected area three times weekly   MAGIC MOUTHWASH SOLN    Take 10 mLs 3 (three) times daily as needed by mouth for mouth pain.   ONDANSETRON (ZOFRAN) 4 MG TABLET    Take 1 tablet (4 mg total) by mouth every 6 (six) hours as needed for nausea or vomiting.   OXYCODONE-ACETAMINOPHEN (ROXICET) 5-325 MG TABLET    Take 1 tablet by mouth every 6 (six) hours as needed for severe pain.   PENICILLIN V POTASSIUM (VEETID) 500 MG TABLET    Take 1 tablet (500 mg total) by mouth 4 (four) times daily.   POLYETHYLENE GLYCOL (MIRALAX) PACKET    Take 17 g by mouth daily.   TRAMADOL (ULTRAM) 50 MG TABLET    Take 1 tablet (50 mg total) by mouth 3 (three) times daily as needed.    Subjective: Gabriel [name] is in for his routine HIV follow-up visit.  He has had no problems obtaining, taking or tolerating his Descovy or Prezcobix but estimates that he has missed about 3 doses in the past month.  He generally takes it about 11:30 at night after getting off of work and sometimes he falls asleep before taking it.  He says that he is gaining a lot of weight.  He has not used any drugs since his last   visit.  He has not been sexually active.  He has rectal pain and drainage has decreased.  He saw Dr. Steven Gross and had an exam and was told that he needed surgery.  He wants to put the surgery off until April given his work schedule and current limited income.  Review of Systems: Review of Systems  Constitutional: Negative for chills, diaphoresis, fever, malaise/fatigue and weight loss.  HENT: Negative for sore throat.   Respiratory: Negative for cough, sputum production and shortness of breath.   Cardiovascular: Negative for chest pain.  Gastrointestinal: Negative for abdominal pain, diarrhea, heartburn, nausea and vomiting.       Noted in HPI.  Genitourinary: Negative for dysuria and frequency.  Musculoskeletal: Negative for joint pain and myalgias.  Skin: Negative for rash.  Neurological: Negative for dizziness and headaches.    Psychiatric/Behavioral: Negative for depression and substance abuse. The patient is not nervous/anxious.     Past Medical History:  Diagnosis Date  . Cellulitis   . HIV positive (HCC)   . MRSA infection     Social History   Tobacco Use  . Smoking status: Current Every Day Smoker    Packs/day: 1.00    Types: Cigarettes  . Smokeless tobacco: Never Used  Substance Use Topics  . Alcohol use: Yes    Alcohol/week: 1.8 oz    Types: 3 Standard drinks or equivalent per week    Comment: socially   . Drug use: No    Comment: none since last year august 2018    Family History  Problem Relation Age of Onset  . Hypertension Mother   . Cancer Maternal Grandmother   . Crohn's disease Brother     Allergies  Allergen Reactions  . Abacavir     HLA [id number] positive - should not receive abacavir due to risk of hypersensitivity    Health Maintenance  Topic Date Due  . TETANUS/TDAP  03/29/1995  . INFLUENZA VACCINE  05/24/2017  . HIV Screening  Completed    Objective:  Vitals:   11/23/17 0900  BP: 124/87  Pulse: 69  Temp: 98.2 F (36.8 C)  TempSrc: Oral  Weight: 197 lb (89.4 kg)  Height: 6' 6" (1.981 m)   Body mass index is 22.77 kg/m.  Physical Exam  Constitutional: He is oriented to person, place, and time.  He is in good spirits.  He has gained 11 pounds since September.  HENT:  Mouth/Throat: No oropharyngeal exudate.  Eyes: Conjunctivae are normal.  Cardiovascular: Normal rate and regular rhythm.  No murmur heard. Pulmonary/Chest: Effort normal and breath sounds normal.  Abdominal: Soft. He exhibits no mass. There is no tenderness.  Musculoskeletal: Normal range of motion.  Neurological: He is alert and oriented to person, place, and time.  Skin: No rash noted.  Psychiatric: Mood and affect normal.    Lab Results Lab Results  Component Value Date   WBC 4.2 06/27/2017   HGB 14.6 06/27/2017   HCT 44.3 06/27/2017   MCV 93.9 06/27/2017   PLT 186 06/27/2017     Lab Results  Component Value Date   CREATININE 0.77 06/27/2017   BUN 11 06/27/2017   NA 142 06/27/2017   K 3.8 06/27/2017   CL 106 06/27/2017   CO2 25 06/27/2017    Lab Results  Component Value Date   ALT 15 06/27/2017   AST 18 06/27/2017   ALKPHOS 73 06/27/2017   BILITOT 0.5 06/27/2017    Lab Results  Component Value Date     CHOL 115 04/24/2014   HDL 32 (L) 04/24/2014   LDLCALC 30 04/24/2014   TRIG 263 (H) 04/24/2014   CHOLHDL 3.6 04/24/2014   Lab Results  Component Value Date   LABRPR REACTIVE (A) 06/27/2017   RPRTITER 1:2 06/27/2017   HIV 1 RNA Quant (copies/mL)  Date Value  11/06/2017 5,370 (H)  06/27/2017 3,850 (H)  03/26/2015 44 (H)   CD4 T Cell Abs (/uL)  Date Value  11/06/2017 460  06/27/2017 250 (L)  03/26/2015 480     Problem List Items Addressed This Visit      High   Human immunodeficiency virus (HIV) disease ([place])    His viral load is not suppressing.  He will get blood work today to check resistance assays and follow-up in 3 weeks.  I asked him to try not to miss a single dose of his medication between now and his next visit.  He denies being sexually active recently.      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        Unprioritized   Depression    His depression is in remission, most likely because he is now sober and off of drugs.      Genital warts    I encouraged him to go ahead and set up a date for surgery in April.           Michel Bickers, MD [facility] [phone] pager   [phone] cell 11/23/2017, 9:35 AM

## 2017-11-23 NOTE — Assessment & Plan Note (Signed)
His viral load is not suppressing.  He will get blood work today to check resistance assays and follow-up in 3 weeks.  I asked him to try not to miss a single dose of his medication between now and his next visit.  He denies being sexually active recently.

## 2017-11-23 NOTE — Assessment & Plan Note (Signed)
I encouraged him to go ahead and set up a date for surgery in April.

## 2017-11-26 ENCOUNTER — Emergency Department
Admission: EM | Admit: 2017-11-26 | Discharge: 2017-11-26 | Disposition: A | Discharge status: Home / Self Care | Payer: BLUE CROSS/BLUE SHIELD | Attending: Emergency Medicine | Admitting: Emergency Medicine

## 2017-11-26 ENCOUNTER — Other Ambulatory Visit: Payer: Self-pay

## 2017-11-26 DIAGNOSIS — Z79899 Other long term (current) drug therapy: Secondary | ICD-10-CM | POA: Diagnosis not present

## 2017-11-26 DIAGNOSIS — J101 Influenza due to other identified influenza virus with other respiratory manifestations: Secondary | ICD-10-CM | POA: Diagnosis not present

## 2017-11-26 DIAGNOSIS — F17210 Nicotine dependence, cigarettes, uncomplicated: Secondary | ICD-10-CM | POA: Diagnosis not present

## 2017-11-26 DIAGNOSIS — R1110 Vomiting, unspecified: Secondary | ICD-10-CM | POA: Diagnosis not present

## 2017-11-26 DIAGNOSIS — J1089 Influenza due to other identified influenza virus with other manifestations: Secondary | ICD-10-CM | POA: Diagnosis not present

## 2017-11-26 LAB — CBC
HEMATOCRIT: 45.9 % (ref 40.0–52.0)
HEMOGLOBIN: 15.7 g/dL (ref 13.0–18.0)
MCH: 32 pg (ref 26.0–34.0)
MCHC: 34.1 g/dL (ref 32.0–36.0)
MCV: 93.9 fL (ref 80.0–100.0)
Platelets: 135 10*3/uL — ABNORMAL LOW (ref 150–440)
RBC: 4.89 MIL/uL (ref 4.40–5.90)
RDW: 14.7 % — ABNORMAL HIGH (ref 11.5–14.5)
WBC: 3.1 10*3/uL — ABNORMAL LOW (ref 3.8–10.6)

## 2017-11-26 LAB — BASIC METABOLIC PANEL
Anion gap: 10 (ref 5–15)
BUN: 16 mg/dL (ref 6–20)
CHLORIDE: 105 mmol/L (ref 101–111)
CO2: 23 mmol/L (ref 22–32)
Calcium: 8.7 mg/dL — ABNORMAL LOW (ref 8.9–10.3)
Creatinine, Ser: 1.13 mg/dL (ref 0.61–1.24)
GFR calc Af Amer: >60 mL/min (ref >60)
GFR calc non Af Amer: >60 mL/min (ref >60)
GLUCOSE: 106 mg/dL — AB (ref 65–99)
POTASSIUM: 3.5 mmol/L (ref 3.5–5.1)
Sodium: 138 mmol/L (ref 135–145)

## 2017-11-26 LAB — INFLUENZA PANEL BY PCR (TYPE A & B)
Influenza A By PCR: POSITIVE — AB
Influenza B By PCR: NEGATIVE

## 2017-11-26 MED ORDER — ONDANSETRON 4 MG PO TBDP: 4.0000 mg | ORAL_TABLET | Freq: Three times a day (TID) | ORAL | 0 refills | Status: DC | PRN

## 2017-11-26 MED ORDER — ACETAMINOPHEN 500 MG PO TABS
ORAL_TABLET | ORAL | Status: AC
  Administered 2017-11-26: 1000 mg via ORAL
  Filled 2017-11-26: qty 2

## 2017-11-26 MED ORDER — SODIUM CHLORIDE 0.9 % IV BOLUS (SEPSIS)
1000.0000 mL | Freq: Once | INTRAVENOUS | Status: AC
  Administered 2017-11-26: 1000 mL via INTRAVENOUS

## 2017-11-26 MED ORDER — OSELTAMIVIR PHOSPHATE 75 MG PO CAPS
75.0000 mg | ORAL_CAPSULE | Freq: Once | ORAL | Status: AC
  Administered 2017-11-26: 75 mg via ORAL
  Filled 2017-11-26 (×2): qty 1

## 2017-11-26 MED ORDER — ACETAMINOPHEN 500 MG PO TABS
1000.0000 mg | ORAL_TABLET | Freq: Once | ORAL | Status: AC
  Administered 2017-11-26: 1000 mg via ORAL

## 2017-11-26 MED ORDER — OSELTAMIVIR PHOSPHATE 75 MG PO CAPS: 75.0000 mg | ORAL_CAPSULE | Freq: Two times a day (BID) | ORAL | 0 refills | Status: AC

## 2017-11-26 NOTE — ED Provider Notes (Signed)
[facility] Emergency Department Provider Note   ____________________________________________    I have reviewed the triage vital signs and the nursing notes.   HISTORY  Chief Complaint Emesis and Generalized Body Aches     HPI Gabriel [name] is a 42 y.o. male with a history of HIV presents with complaints of body aches, chills, nausea, loose stools for 2 days.  He did not get a flu shot this year.  No recent travel.  Has not taken anything for the symptoms.  No cough or shortness of breath.  Reports compliance with HIV medications   Past Medical History:  Diagnosis Date  . Cellulitis   . HIV positive (HCC)   . MRSA infection     Patient Active Problem List   Diagnosis Date Noted  . Genital warts 06/27/2017  . IVDU (intravenous drug user) 06/27/2017  . Depression 06/27/2017  . Hematochezia 03/26/2015  . Dermatitis 06/10/2014  . Dyslipidemia 04/14/2011  . Blood in stool 03/20/2009  . 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 DENTAL CARIES 01/02/2009  . TOBACCO USER 05/27/2008  . INSOMNIA, CHRONIC 05/27/2008  . Human immunodeficiency virus (HIV) disease (HCC) 11/28/2006  . SYPHILIS NOS 11/28/2006    Past Surgical History:  Procedure Laterality Date  . HAND / FINGER LESION EXCISION      Prior to Admission medications   Medication Sig Start Date End Date Taking? Authorizing Provider  benzonatate (TESSALON PERLES) 100 MG capsule Take 1 capsule (100 mg total) 2 (two) times daily by mouth. Patient not taking: Reported on 11/23/2017 08/30/17 08/30/18  Judyann MunsonSnider, Cynthia, MD  darunavir-cobicistat (PREZCOBIX) 800-150 MG tablet Take 1 tablet by mouth daily with supper. Swallow whole. Do NOT crush, break or chew tablets. Take with food. 06/27/17   Cliffton Astersampbell, John, MD  emtricitabine-tenofovir AF (DESCOVY) 200-25 MG tablet Take 1 tablet by mouth daily. 06/27/17   Cliffton Astersampbell, John, MD  gentamicin (GARAMYCIN) 0.3 % ophthalmic solution Place 1  drop into the left eye every 4 (four) hours. Patient not taking: Reported on 11/23/2017 07/13/17   Joni ReiningSmith, Ronald K, PA-C  ibuprofen (ADVIL,MOTRIN) 800 MG tablet Take 1 tablet (800 mg total) by mouth every 8 (eight) hours as needed (pain). 12/04/15   Hagler, Jami L, PA-C  nicotine ([employer] NICOTINE TRANSDERMAL SYS) 14 mg/24hr patch Place 1 patch (14 mg total) onto the skin daily. Patient not taking: Reported on 01/06/2015 11/19/13   Ginnie SmartHatcher, Jeffrey C, MD  ondansetron (ZOFRAN ODT) 4 MG disintegrating tablet Take 1 tablet (4 mg total) by mouth every 8 (eight) hours as needed for nausea or vomiting. 11/26/17   Jene EveryKinner, Malachy Coleman, MD  oseltamivir (TAMIFLU) 75 MG capsule Take 1 capsule (75 mg total) by mouth 2 (two) times daily for 5 days. 11/26/17 12/01/17  Jene EveryKinner, Monta Police, MD     Allergies Abacavir  Family History  Problem Relation Age of Onset  . Hypertension Mother   . Cancer Maternal Grandmother   . Crohn's disease Brother     Social History Social History   Tobacco Use  . Smoking status: Current Every Day Smoker    Packs/day: 1.00    Types: Cigarettes  . Smokeless tobacco: Never Used  Substance Use Topics  . Alcohol use: Yes    Alcohol/week: 1.8 oz    Types: 3 Standard drinks or equivalent per week    Comment: socially   . Drug use: No    Comment: none since last year august 2018    Review of Systems  Constitutional: No fever/chills Eyes: No visual changes.  ENT: No sore throat. Cardiovascular: Denies chest pain. Respiratory: Denies shortness of breath. Gastrointestinal: No abdominal pain.  No nausea, no vomiting.   Genitourinary: Negative for dysuria. Musculoskeletal: Negative for back pain. Skin: Negative for rash. Neurological: Negative for headaches or weakness   ____________________________________________   PHYSICAL EXAM:  VITAL SIGNS: ED Triage Vitals  Enc Vitals Group     BP 11/26/17 1624 94/61     Pulse Rate 11/26/17 1624 (!) 113     Resp 11/26/17 1624 20     Temp  11/26/17 1624 98.9 F (37.2 C)     Temp Source 11/26/17 1624 Oral     SpO2 11/26/17 1624 97 %     Weight 11/26/17 1625 89.4 kg (197 lb)     Height 11/26/17 1625 1.981 m (6\' 6" )     Head Circumference --      Peak Flow --      Pain Score 11/26/17 1629 7     Pain Loc --      Pain Edu? --      Excl. in GC? --     Constitutional: Alert and oriented. No acute distress. Pleasant and interactive Eyes: Conjunctivae are normal.   Nose: No congestion/rhinnorhea. Mouth/Throat: Mucous membranes are moist.    Cardiovascular: Normal rate, regular rhythm.   Good peripheral circulation. Respiratory: Normal respiratory effort.  No retractions.  Gastrointestinal: Soft and nontender. No distention.  No CVA tenderness. Genitourinary: deferred Musculoskeletal:   Warm and well perfused Neurologic:  Normal speech and language. No gross focal neurologic deficits are appreciated.  Skin:  Skin is warm, dry and intact. No rash noted. Psychiatric: Mood and affect are normal. Speech and behavior are normal.  ____________________________________________   LABS (all labs ordered are listed, but only abnormal results are displayed)  Labs Reviewed  INFLUENZA PANEL BY PCR (TYPE A & B) - Abnormal; Notable for the following components:      Result Value   Influenza A By PCR POSITIVE (*)    All other components within normal limits  CBC - Abnormal; Notable for the following components:   WBC 3.1 (*)    RDW 14.7 (*)    Platelets 135 (*)    All other components within normal limits  BASIC METABOLIC PANEL - Abnormal; Notable for the following components:   Glucose, Bld 106 (*)    Calcium 8.7 (*)    All other components within normal limits   ____________________________________________  EKG  None ____________________________________________  RADIOLOGY  None ____________________________________________   PROCEDURES  Procedure(s) performed: No  Procedures   Critical Care performed:  No ____________________________________________   INITIAL IMPRESSION / ASSESSMENT AND PLAN / ED COURSE  Pertinent labs & imaging results that were available during my care of the patient were reviewed by me and considered in my medical decision making (see chart for details).  Patient well-appearing in no acute distress.  Mild tachycardia on exam.  Symptoms are consistent with influenza and influenza swab is indeed positive.  Patient treated with IV fluids, lab work is overall reassuring.  Recommend outpatient follow-up, will start Tamiflu and Zofran.    ____________________________________________   FINAL CLINICAL IMPRESSION(S) / ED DIAGNOSES  Final diagnoses:  Influenza A        Note:  This document was prepared using Dragon voice recognition software and may include unintentional dictation errors.    Jene Every, MD 11/26/17 [phone]

## 2017-11-26 NOTE — ED Notes (Signed)
Patient ambulatory to lobby with steady gait, NAD noted. Verbalized understanding of discharge instructions and followup care.  

## 2017-11-26 NOTE — ED Triage Notes (Signed)
Pt states Friday he wasn't feel well. Yesterday vomiting, cough, bodyaches. States he has laid in bed. Believes he is dehydrated d/t chapped lips. States has been taking robitussin and Catering manageralka seltzer. Also c/o HA. States has tried to eat chicken noodle soup but vomiting it back up.   Alert, oriented, ambulatory. No distress noted. Given mask in triage.

## 2017-11-29 LAB — HIV RNA, RTPCR W/R GT (RTI, PI,INT)
HIV 1 RNA Quant: 82 copies/mL — ABNORMAL HIGH
HIV-1 RNA QUANT, LOG: 1.91 {Log_copies}/mL — AB

## 2017-12-07 ENCOUNTER — Emergency Department
Admission: EM | Admit: 2017-12-07 | Discharge: 2017-12-07 | Disposition: A | Discharge status: Home / Self Care | Payer: BLUE CROSS/BLUE SHIELD | Attending: Emergency Medicine | Admitting: Emergency Medicine

## 2017-12-07 ENCOUNTER — Emergency Department: Payer: BLUE CROSS/BLUE SHIELD

## 2017-12-07 ENCOUNTER — Encounter: Payer: Self-pay | Admitting: *Deleted

## 2017-12-07 ENCOUNTER — Other Ambulatory Visit: Payer: Self-pay

## 2017-12-07 DIAGNOSIS — S51831A Puncture wound without foreign body of right forearm, initial encounter: Secondary | ICD-10-CM | POA: Diagnosis not present

## 2017-12-07 DIAGNOSIS — R05 Cough: Secondary | ICD-10-CM | POA: Insufficient documentation

## 2017-12-07 DIAGNOSIS — Y999 Unspecified external cause status: Secondary | ICD-10-CM | POA: Diagnosis not present

## 2017-12-07 DIAGNOSIS — Z79899 Other long term (current) drug therapy: Secondary | ICD-10-CM | POA: Diagnosis not present

## 2017-12-07 DIAGNOSIS — Y9389 Activity, other specified: Secondary | ICD-10-CM | POA: Insufficient documentation

## 2017-12-07 DIAGNOSIS — S61551A Open bite of right wrist, initial encounter: Secondary | ICD-10-CM | POA: Diagnosis not present

## 2017-12-07 DIAGNOSIS — S61501A Unspecified open wound of right wrist, initial encounter: Secondary | ICD-10-CM | POA: Diagnosis not present

## 2017-12-07 DIAGNOSIS — F17210 Nicotine dependence, cigarettes, uncomplicated: Secondary | ICD-10-CM | POA: Diagnosis not present

## 2017-12-07 DIAGNOSIS — W540XXA Bitten by dog, initial encounter: Secondary | ICD-10-CM | POA: Insufficient documentation

## 2017-12-07 DIAGNOSIS — S61531A Puncture wound without foreign body of right wrist, initial encounter: Secondary | ICD-10-CM | POA: Insufficient documentation

## 2017-12-07 DIAGNOSIS — Z23 Encounter for immunization: Secondary | ICD-10-CM | POA: Diagnosis not present

## 2017-12-07 DIAGNOSIS — R059 Cough, unspecified: Secondary | ICD-10-CM

## 2017-12-07 DIAGNOSIS — Y929 Unspecified place or not applicable: Secondary | ICD-10-CM | POA: Insufficient documentation

## 2017-12-07 DIAGNOSIS — B20 Human immunodeficiency virus [HIV] disease: Secondary | ICD-10-CM | POA: Insufficient documentation

## 2017-12-07 DIAGNOSIS — S51851A Open bite of right forearm, initial encounter: Secondary | ICD-10-CM | POA: Diagnosis not present

## 2017-12-07 DIAGNOSIS — S6981XA Other specified injuries of right wrist, hand and finger(s), initial encounter: Secondary | ICD-10-CM | POA: Diagnosis not present

## 2017-12-07 HISTORY — DX: Cutaneous abscess, unspecified: L02.91

## 2017-12-07 MED ORDER — IBUPROFEN 800 MG PO TABS
800.0000 mg | ORAL_TABLET | Freq: Once | ORAL | Status: AC
  Administered 2017-12-07: 800 mg via ORAL
  Filled 2017-12-07: qty 1

## 2017-12-07 MED ORDER — TETANUS-DIPHTH-ACELL PERTUSSIS 5-2.5-18.5 LF-MCG/0.5 IM SUSP
0.5000 mL | Freq: Once | INTRAMUSCULAR | Status: AC
  Administered 2017-12-07: 0.5 mL via INTRAMUSCULAR
  Filled 2017-12-07: qty 0.5

## 2017-12-07 MED ORDER — AMOXICILLIN-POT CLAVULANATE 875-125 MG PO TABS: 1.0000 | ORAL_TABLET | Freq: Two times a day (BID) | ORAL | 0 refills | Status: DC

## 2017-12-07 MED ORDER — AMOXICILLIN-POT CLAVULANATE 875-125 MG PO TABS
1.0000 | ORAL_TABLET | Freq: Once | ORAL | Status: AC
Start: 2017-12-07 — End: 2017-12-07
  Administered 2017-12-07: 1 via ORAL
  Filled 2017-12-07: qty 1

## 2017-12-07 MED ORDER — TRAMADOL HCL 50 MG PO TABS: 50.0000 mg | ORAL_TABLET | Freq: Four times a day (QID) | ORAL | 0 refills | Status: DC | PRN

## 2017-12-07 NOTE — ED Notes (Signed)
Patient taken to imaging. 

## 2017-12-07 NOTE — Discharge Instructions (Addendum)
Follow-up with your regular doctor or Dr. Hyacinth MeekerMiller if you are not better in 3-5 days.  Use the antibiotic as prescribed.  You may need to wash this area daily with soap.  If the area becomes more swollen and more infected please return to the emergency department because you may need IV antibiotics.  Take the pain medication only if needed.  Return to the emergency department if you have any other concerns

## 2017-12-07 NOTE — ED Notes (Signed)
Emergency planning/management officerolice Officer not available at this time to report animal bite. Will try to contact again.

## 2017-12-07 NOTE — ED Notes (Signed)
First nurse note  Presents with dog nite to wrist

## 2017-12-07 NOTE — ED Triage Notes (Signed)
Patient states he was bitten on the right wrist today at 1130. Patient does not want to contact animal control. Patient states he knows who owns the dog and the owners states rabies are up to date. Patient c/o anterior right wrist pain. Bleeding is controlled at this time.

## 2017-12-07 NOTE — ED Notes (Signed)
Officer at bedside.

## 2017-12-07 NOTE — ED Notes (Signed)
Pt cleaned wound under sink in room with soap and water prior to splint placement.

## 2017-12-07 NOTE — ED Provider Notes (Signed)
[facility] Emergency Department Provider Note  ____________________________________________   None    (approximate)  I have reviewed the triage vital signs and the nursing notes.   HISTORY  Chief Complaint Animal Bite    HPI Gabriel [name] is a 42 y.o. male complains of right wrist and arm pain.  He states he was bit by a dog earlier today.  The 2 dogs are trying to fight and he reached in between him to keep him away from the other dog.  He states the dog is a Scientific laboratory technician.  He states he knows the dog and his shots are up-to-date.  He states that the wrist is very painful and it hurts to move his fingers.  He denies fever or chills.  Also he has had a cough.  He states he had the flu couple of weeks ago and he still has his cough.  Patient is immunocompromised.  Concerns of pneumonia would be valid.  He states his CD4 counts are good however his viral load is elevating.  Past Medical History:  Diagnosis Date  . Abscess    buttocks  . Cellulitis   . HIV positive (HCC)   . MRSA infection     Patient Active Problem List   Diagnosis Date Noted  . Genital warts 06/27/2017  . IVDU (intravenous drug user) 06/27/2017  . Depression 06/27/2017  . Hematochezia 03/26/2015  . Dermatitis 06/10/2014  . Dyslipidemia 04/14/2011  . Blood in stool 03/20/2009  . 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 DENTAL CARIES 01/02/2009  . TOBACCO USER 05/27/2008  . INSOMNIA, CHRONIC 05/27/2008  . Human immunodeficiency virus (HIV) disease (HCC) 11/28/2006  . SYPHILIS NOS 11/28/2006    Past Surgical History:  Procedure Laterality Date  . HAND / FINGER LESION EXCISION      Prior to Admission medications   Medication Sig Start Date End Date Taking? Authorizing Provider  amoxicillin-clavulanate (AUGMENTIN) 875-125 MG tablet Take 1 tablet by mouth 2 (two) times daily. 12/07/17   Johnthan Axtman, Roselyn Bering, PA-C  darunavir-cobicistat (PREZCOBIX) 800-150 MG  tablet Take 1 tablet by mouth daily with supper. Swallow whole. Do NOT crush, break or chew tablets. Take with food. 06/27/17   Cliffton Asters, MD  emtricitabine-tenofovir AF (DESCOVY) 200-25 MG tablet Take 1 tablet by mouth daily. 06/27/17   Cliffton Asters, MD  ibuprofen (ADVIL,MOTRIN) 800 MG tablet Take 1 tablet (800 mg total) by mouth every 8 (eight) hours as needed (pain). 12/04/15   Hagler, Jami L, PA-C  ondansetron (ZOFRAN ODT) 4 MG disintegrating tablet Take 1 tablet (4 mg total) by mouth every 8 (eight) hours as needed for nausea or vomiting. 11/26/17   Jene Every, MD  traMADol (ULTRAM) 50 MG tablet Take 1 tablet (50 mg total) by mouth every 6 (six) hours as needed. 12/07/17   Faythe Ghee, PA-C    Allergies Abacavir  Family History  Problem Relation Age of Onset  . Hypertension Mother   . Cancer Maternal Grandmother   . Crohn's disease Brother     Social History Social History   Tobacco Use  . Smoking status: Current Every Day Smoker    Packs/day: 1.00    Types: Cigarettes  . Smokeless tobacco: Never Used  Substance Use Topics  . Alcohol use: Yes    Alcohol/week: 1.8 oz    Types: 3 Standard drinks or equivalent per week    Comment: socially   . Drug use: No    Comment:  none since last year august 2018    Review of Systems  Constitutional: No fever/chills Eyes: No visual changes. ENT: No sore throat. Respiratory: Positive cough Genitourinary: Negative for dysuria. Musculoskeletal: Negative for back pain.  Positive for right wrist pain Skin: Negative for rash.  Positive for dog bite    ____________________________________________   PHYSICAL EXAM:  VITAL SIGNS: ED Triage Vitals  Enc Vitals Group     BP 12/07/17 1659 126/89     Pulse Rate 12/07/17 1659 84     Resp 12/07/17 1659 20     Temp 12/07/17 1659 98.1 F (36.7 C)     Temp Source 12/07/17 1659 Oral     SpO2 12/07/17 1659 100 %     Weight 12/07/17 1740 197 lb (89.4 kg)     Height 12/07/17 1740 6'  6" (1.981 m)     Head Circumference --      Peak Flow --      Pain Score 12/07/17 1737 10     Pain Loc --      Pain Edu? --      Excl. in GC? --     Constitutional: Alert and oriented. Well appearing and in no acute distress. Eyes: Conjunctivae are normal.  Head: Atraumatic. Nose: No congestion/rhinnorhea. Mouth/Throat: Mucous membranes are moist.   Neck: Neck is supple, no lymphadenopathy is noted Cardiovascular: Normal rate, regular rhythm.  Heart sounds are normal Respiratory: Normal respiratory effort.  No retractions, lungs are clear to auscultation, cough is dry GU: deferred Musculoskeletal: There are several puncture wounds on the right forearm and wrist that are on the volar and dorsal aspect.  Pain is reproduced with movement of the wrist and fingers.  However he does have full range of motion.  Neurovascular appears to be intact Neurologic:  Normal speech and language.  Skin:  Skin is warm, dry ; positive for puncture wounds to the right forearm and wrist, there is no drainage from these areas, there is no pus or redness, the areas are slightly swollen Psychiatric: Mood and affect are normal. Speech and behavior are normal.  ____________________________________________   LABS (all labs ordered are listed, but only abnormal results are displayed)  Labs Reviewed - No data to display ____________________________________________   ____________________________________________  RADIOLOGY  X-ray of the right wrist is negative for foreign body or fracture Chest x-ray is negative for pneumonia  ____________________________________________   PROCEDURES  Procedure(s) performed: Area was cleaned and dressed by the nursing staff.  A Velcro cockup splint was applied for comfort.  A Tdap was ordered.  The patient was given 1 dose of his Augmentin and and ibuprofen 800 mg  Procedures    ____________________________________________   INITIAL IMPRESSION / ASSESSMENT AND  PLAN / ED COURSE  Pertinent labs & imaging results that were available during my care of the patient were reviewed by me and considered in my medical decision making (see chart for details).  The patient is a 42 year old male reports to the emergency department due to a dog bite.  He states he knows the dog and states he is up-to-date on his rabies vaccines.  He states he tried to stop the dog from going after another dog and got bit.  He is complaining of wrist pain.  He is also complaining of a cough due to a recent influenza infection is concerned he might have pneumonia.  On physical exam he appears basically well, he is afebrile.  The right wrist is tender and swollen  and there are several puncture wounds.  His cough is dry and hacking.  But his lungs are clear to auscultation  X-ray of the right wrist is negative for foreign body or fracture.  X-ray of the chest is negative for pneumonia or any other pulmonary abnormalities.  The x-ray results were discussed with patient.  Explained to him that we would clean and dress the wounds.  He would be placed in a cockup splint for comfort.  He will be given an antibiotic as a puncture wound from a dog will cause infection.  He is also instructed to clean the area daily.  If he is worsening and the swelling and redness are becoming worse, he is to return to the emergency room in case he needs IV antibiotics.  Patient states he understands and will comply with our instructions.  The police were called by nursing staff to take report of the dog bite.  Patient was discharged in stable condition     As part of my medical decision making, I reviewed the following data within the electronic MEDICAL RECORD NUMBER Nursing notes reviewed and incorporated, Old chart reviewed, Radiograph reviewed x-ray of the wrist and chest are negative, Notes from prior ED visits and [place] Controlled Substance Database  ____________________________________________   FINAL CLINICAL  IMPRESSION(S) / ED DIAGNOSES  Final diagnoses:  Dog bite, initial encounter      NEW MEDICATIONS STARTED DURING THIS VISIT:  New Prescriptions   AMOXICILLIN-CLAVULANATE (AUGMENTIN) 875-125 MG TABLET    Take 1 tablet by mouth 2 (two) times daily.   TRAMADOL (ULTRAM) 50 MG TABLET    Take 1 tablet (50 mg total) by mouth every 6 (six) hours as needed.     Note:  This document was prepared using Dragon voice recognition software and may include unintentional dictation errors.    Faythe Ghee, PA-C 12/07/17 1814    Rockne Menghini, MD 12/07/17 [phone]

## 2017-12-14 ENCOUNTER — Ambulatory Visit: Payer: BLUE CROSS/BLUE SHIELD | Admitting: Internal Medicine

## 2017-12-18 ENCOUNTER — Emergency Department
Admission: EM | Admit: 2017-12-18 | Discharge: 2017-12-18 | Disposition: A | Discharge status: Home / Self Care | Payer: BLUE CROSS/BLUE SHIELD | Attending: Emergency Medicine | Admitting: Emergency Medicine

## 2017-12-18 ENCOUNTER — Other Ambulatory Visit: Payer: Self-pay

## 2017-12-18 ENCOUNTER — Emergency Department: Payer: BLUE CROSS/BLUE SHIELD

## 2017-12-18 DIAGNOSIS — S91351A Open bite, right foot, initial encounter: Secondary | ICD-10-CM | POA: Insufficient documentation

## 2017-12-18 DIAGNOSIS — W540XXA Bitten by dog, initial encounter: Secondary | ICD-10-CM | POA: Insufficient documentation

## 2017-12-18 DIAGNOSIS — S91311A Laceration without foreign body, right foot, initial encounter: Secondary | ICD-10-CM | POA: Diagnosis not present

## 2017-12-18 DIAGNOSIS — Z5321 Procedure and treatment not carried out due to patient leaving prior to being seen by health care provider: Secondary | ICD-10-CM | POA: Insufficient documentation

## 2017-12-18 DIAGNOSIS — Y939 Activity, unspecified: Secondary | ICD-10-CM | POA: Diagnosis not present

## 2017-12-18 DIAGNOSIS — Y998 Other external cause status: Secondary | ICD-10-CM | POA: Insufficient documentation

## 2017-12-18 DIAGNOSIS — Y92009 Unspecified place in unspecified non-institutional (private) residence as the place of occurrence of the external cause: Secondary | ICD-10-CM | POA: Diagnosis not present

## 2017-12-18 NOTE — ED Triage Notes (Signed)
Pt states he was letting his brothers dog out and it went crazy and but him on the right foot. States he is not going to give information to report it. Only information [place]

## 2017-12-18 NOTE — ED Notes (Addendum)
Called pt's phone  Stated that he needed to leave  When he went to get his charger his ride had to leave  states he would return in the morning

## 2017-12-18 NOTE — ED Notes (Signed)
Pt reports that his brother's dog was in an altercation with another dog.  Per pt he was bitten trying to break them apart.  Per pt this happened another time last week with his dog.  Pt is profusely refusing to report and doesn't want anything, but crutches.  [employer] officer notified of this for further investigation.

## 2017-12-18 NOTE — ED Notes (Signed)
Pt brought to room 54 by wheelchair

## 2017-12-18 NOTE — ED Notes (Signed)
Spoke with Ecologistfficer Snow at front desk with [employer].  Since pt has [place] address he states he will contact [employer] to come talk with pt.

## 2017-12-18 NOTE — ED Notes (Signed)
Went to car to get [employer] account plannerphone charger

## 2018-01-02 ENCOUNTER — Telehealth: Payer: Self-pay | Admitting: Pharmacy Technician

## 2018-01-02 NOTE — Telephone Encounter (Signed)
I and [facility] have called multiple times to refill [name]'s HIV medications.  No answer, no voicemail.

## 2018-07-21 ENCOUNTER — Emergency Department: Payer: BLUE CROSS/BLUE SHIELD

## 2018-07-21 ENCOUNTER — Emergency Department
Admission: EM | Admit: 2018-07-21 | Discharge: 2018-07-21 | Disposition: A | Discharge status: Home / Self Care | Payer: BLUE CROSS/BLUE SHIELD | Attending: Emergency Medicine | Admitting: Emergency Medicine

## 2018-07-21 ENCOUNTER — Encounter: Payer: Self-pay | Admitting: Emergency Medicine

## 2018-07-21 DIAGNOSIS — B20 Human immunodeficiency virus [HIV] disease: Secondary | ICD-10-CM | POA: Diagnosis not present

## 2018-07-21 DIAGNOSIS — R509 Fever, unspecified: Secondary | ICD-10-CM | POA: Diagnosis not present

## 2018-07-21 DIAGNOSIS — R05 Cough: Secondary | ICD-10-CM | POA: Diagnosis not present

## 2018-07-21 DIAGNOSIS — Z79899 Other long term (current) drug therapy: Secondary | ICD-10-CM | POA: Insufficient documentation

## 2018-07-21 DIAGNOSIS — R6889 Other general symptoms and signs: Secondary | ICD-10-CM

## 2018-07-21 DIAGNOSIS — F17210 Nicotine dependence, cigarettes, uncomplicated: Secondary | ICD-10-CM | POA: Insufficient documentation

## 2018-07-21 DIAGNOSIS — J111 Influenza due to unidentified influenza virus with other respiratory manifestations: Secondary | ICD-10-CM | POA: Insufficient documentation

## 2018-07-21 DIAGNOSIS — J069 Acute upper respiratory infection, unspecified: Secondary | ICD-10-CM

## 2018-07-21 LAB — CBC WITH DIFFERENTIAL/PLATELET
Basophils Absolute: 0.3 10*3/uL — ABNORMAL HIGH (ref 0–0.1)
Basophils Relative: 3 %
Eosinophils Absolute: 0 10*3/uL (ref 0–0.7)
Eosinophils Relative: 0 %
HEMATOCRIT: 42.3 % (ref 40.0–52.0)
HEMOGLOBIN: 14.9 g/dL (ref 13.0–18.0)
LYMPHS ABS: 0.7 10*3/uL — AB (ref 1.0–3.6)
Lymphocytes Relative: 7 %
MCH: 33 pg (ref 26.0–34.0)
MCHC: 35.2 g/dL (ref 32.0–36.0)
MCV: 93.7 fL (ref 80.0–100.0)
MONOS PCT: 7 %
Monocytes Absolute: 0.8 10*3/uL (ref 0.2–1.0)
NEUTROS ABS: 9.3 10*3/uL — AB (ref 1.4–6.5)
NEUTROS PCT: 83 %
Platelets: 183 10*3/uL (ref 150–440)
RBC: 4.52 MIL/uL (ref 4.40–5.90)
RDW: 14 % (ref 11.5–14.5)
WBC: 11.2 10*3/uL — AB (ref 3.8–10.6)

## 2018-07-21 LAB — COMPREHENSIVE METABOLIC PANEL
ALK PHOS: 75 U/L (ref 38–126)
ALT: 17 U/L (ref 0–44)
ANION GAP: 12 (ref 5–15)
AST: 22 U/L (ref 15–41)
Albumin: 4.1 g/dL (ref 3.5–5.0)
BUN: 10 mg/dL (ref 6–20)
CHLORIDE: 103 mmol/L (ref 98–111)
CO2: 23 mmol/L (ref 22–32)
Calcium: 8.7 mg/dL — ABNORMAL LOW (ref 8.9–10.3)
Creatinine, Ser: 0.95 mg/dL (ref 0.61–1.24)
GFR calc non Af Amer: >60 mL/min (ref >60)
GLUCOSE: 115 mg/dL — AB (ref 70–99)
POTASSIUM: 3.2 mmol/L — AB (ref 3.5–5.1)
Sodium: 138 mmol/L (ref 135–145)
Total Bilirubin: 1.2 mg/dL (ref 0.3–1.2)
Total Protein: 8.4 g/dL — ABNORMAL HIGH (ref 6.5–8.1)

## 2018-07-21 LAB — LACTIC ACID, PLASMA: Lactic Acid, Venous: 1.1 mmol/L (ref 0.5–1.9)

## 2018-07-21 LAB — INFLUENZA PANEL BY PCR (TYPE A & B)
Influenza A By PCR: NEGATIVE
Influenza B By PCR: NEGATIVE

## 2018-07-21 LAB — URINALYSIS, COMPLETE (UACMP) WITH MICROSCOPIC
BACTERIA UA: NONE SEEN
BILIRUBIN URINE: NEGATIVE
Glucose, UA: NEGATIVE mg/dL
HGB URINE DIPSTICK: NEGATIVE
KETONES UR: NEGATIVE mg/dL
NITRITE: NEGATIVE
PH: 7 (ref 5.0–8.0)
Protein, ur: 30 mg/dL — AB
SPECIFIC GRAVITY, URINE: 1.023 (ref 1.005–1.030)

## 2018-07-21 LAB — PROTIME-INR
INR: 1.1
PROTHROMBIN TIME: 14.1 s (ref 11.4–15.2)

## 2018-07-21 MED ORDER — POTASSIUM CHLORIDE CRYS ER 20 MEQ PO TBCR
40.0000 meq | EXTENDED_RELEASE_TABLET | Freq: Once | ORAL | Status: AC
  Administered 2018-07-21: 40 meq via ORAL
  Filled 2018-07-21: qty 2

## 2018-07-21 MED ORDER — SODIUM CHLORIDE 0.9 % IV SOLN
1.0000 g | Freq: Once | INTRAVENOUS | Status: AC
  Administered 2018-07-21: 1 g via INTRAVENOUS
  Filled 2018-07-21: qty 10

## 2018-07-21 MED ORDER — SODIUM CHLORIDE 0.9 % IV BOLUS
1000.0000 mL | Freq: Once | INTRAVENOUS | Status: AC
  Administered 2018-07-21: 1000 mL via INTRAVENOUS

## 2018-07-21 MED ORDER — KETOROLAC TROMETHAMINE 30 MG/ML IJ SOLN
30.0000 mg | Freq: Once | INTRAMUSCULAR | Status: AC
  Administered 2018-07-21: 30 mg via INTRAVENOUS
  Filled 2018-07-21: qty 1

## 2018-07-21 MED ORDER — DOXYCYCLINE HYCLATE 100 MG PO CAPS: 100.0000 mg | ORAL_CAPSULE | Freq: Two times a day (BID) | ORAL | 0 refills | Status: AC

## 2018-07-21 NOTE — ED Notes (Signed)
MD Shaune Pollack made aware of patients complaint, vital signs, and order for suspected sepsis. No new orders

## 2018-07-21 NOTE — ED Provider Notes (Signed)
[facility] Emergency Department Provider Note  ____________________________________________   I have reviewed the triage vital signs and the nursing notes.   HISTORY  Chief Complaint Generalized Body Aches   History limited by: Not Limited   HPI Gabriel [name] is a 42 y.o. male who presents to the emergency department today with concern for flulike symptoms.  Patient states that he started feeling ill roughly 5 days ago.  It initially started with a cough.  This has been intermittently productive.  Is been nonbloody.  He states that he also felt like he had fevers and chills over the past few days.  Has had generalized body aches.  Patient denies any known sick contacts.  He does have history of HIV and states he has not been on his medications for roughly 3 months.    Per medical record review patient has a history of HIV positive.  Past Medical History:  Diagnosis Date  . Abscess    buttocks  . Cellulitis   . HIV positive (HCC)   . MRSA infection     Patient Active Problem List   Diagnosis Date Noted  . Genital warts 06/27/2017  . IVDU (intravenous drug user) 06/27/2017  . Depression 06/27/2017  . Hematochezia 03/26/2015  . Dermatitis 06/10/2014  . Dyslipidemia 04/14/2011  . Blood in stool 03/20/2009  . 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 DENTAL CARIES 01/02/2009  . TOBACCO USER 05/27/2008  . INSOMNIA, CHRONIC 05/27/2008  . Human immunodeficiency virus (HIV) disease (HCC) 11/28/2006  . SYPHILIS NOS 11/28/2006    Past Surgical History:  Procedure Laterality Date  . HAND / FINGER LESION EXCISION      Prior to Admission medications   Medication Sig Start Date End Date Taking? Authorizing Provider  amoxicillin-clavulanate (AUGMENTIN) 875-125 MG tablet Take 1 tablet by mouth 2 (two) times daily. 12/07/17   Fisher, Roselyn Bering, PA-C  darunavir-cobicistat (PREZCOBIX) 800-150 MG tablet Take 1 tablet by mouth daily with  supper. Swallow whole. Do NOT crush, break or chew tablets. Take with food. 06/27/17   Cliffton Asters, MD  emtricitabine-tenofovir AF (DESCOVY) 200-25 MG tablet Take 1 tablet by mouth daily. 06/27/17   Cliffton Asters, MD  ibuprofen (ADVIL,MOTRIN) 800 MG tablet Take 1 tablet (800 mg total) by mouth every 8 (eight) hours as needed (pain). 12/04/15   Hagler, Jami L, PA-C  ondansetron (ZOFRAN ODT) 4 MG disintegrating tablet Take 1 tablet (4 mg total) by mouth every 8 (eight) hours as needed for nausea or vomiting. 11/26/17   Jene Every, MD  traMADol (ULTRAM) 50 MG tablet Take 1 tablet (50 mg total) by mouth every 6 (six) hours as needed. 12/07/17   Faythe Ghee, PA-C    Allergies Abacavir  Family History  Problem Relation Age of Onset  . Hypertension Mother   . Cancer Maternal Grandmother   . Crohn's disease Brother     Social History Social History   Tobacco Use  . Smoking status: Current Every Day Smoker    Packs/day: 1.00    Types: Cigarettes  . Smokeless tobacco: Never Used  Substance Use Topics  . Alcohol use: Yes    Alcohol/week: 3.0 standard drinks    Types: 3 Standard drinks or equivalent per week    Comment: socially   . Drug use: No    Types: Cocaine, IV, Methamphetamines    Comment: none since last year august 2018    Review of Systems Constitutional: Positive for fevers and chills.  Eyes: No visual changes. ENT: No sore throat. Cardiovascular: Denies chest pain. Respiratory: Denies shortness of breath. Positive for cough. Gastrointestinal: No abdominal pain.   Genitourinary: Negative for dysuria. Musculoskeletal: Positive for body aches. Skin: Negative for rash. Neurological: Negative for headaches, focal weakness or numbness.  ____________________________________________   PHYSICAL EXAM:  VITAL SIGNS: ED Triage Vitals  Enc Vitals Group     BP 07/21/18 1043 128/85     Pulse Rate 07/21/18 1043 (!) 112     Resp 07/21/18 1043 (!) 24     Temp 07/21/18 1043  99.2 F (37.3 C)     Temp Source 07/21/18 1043 Oral     SpO2 07/21/18 1043 95 %     Weight 07/21/18 1044 201 lb (91.2 kg)     Height 07/21/18 1044 6\' 6"  (1.981 m)     Head Circumference --      Peak Flow --     Constitutional: Alert and oriented.  Eyes: Conjunctivae are normal.  ENT      Head: Normocephalic and atraumatic.      Nose: No congestion/rhinnorhea.      Mouth/Throat: Mucous membranes are moist.      Neck: No stridor. Hematological/Lymphatic/Immunilogical: No cervical lymphadenopathy. Cardiovascular: Tachycardia, regular rhythm.  No murmurs, rubs, or gallops.  Respiratory: Normal respiratory effort without tachypnea nor retractions. Breath sounds are clear and equal bilaterally. No wheezes/rales/rhonchi. Gastrointestinal: Soft and non tender. No rebound. No guarding.  Genitourinary: Deferred Musculoskeletal: Normal range of motion in all extremities. No lower extremity edema. Neurologic:  Normal speech and language. No gross focal neurologic deficits are appreciated.  Skin:  Skin is warm, dry and intact. No rash noted. Psychiatric: Mood and affect are normal. Speech and behavior are normal. Patient exhibits appropriate insight and judgment.  ____________________________________________    LABS (pertinent positives/negatives)  Lactic 1.1 CBC wbc 11.2, hgb 14.9, plt 183 CMP na 138, k 3.2, cr 0.95 UA protein 30, trace leukocytes, 11-20 wbc, none seen bacteria ____________________________________________   EKG  I, Phineas Semen, attending physician, personally viewed and interpreted this EKG  EKG Time: 1050 Rate: 113 Rhythm: sinus tachycardia Axis: normal Intervals: qtc 455 QRS: incomplete RBBB ST changes: no st elevation Impression: abnormal ekg   ____________________________________________    RADIOLOGY  CXR Concern for early atypical  infection  ____________________________________________   PROCEDURES  Procedures  ____________________________________________   INITIAL IMPRESSION / ASSESSMENT AND PLAN / ED COURSE  Pertinent labs & imaging results that were available during my care of the patient were reviewed by me and considered in my medical decision making (see chart for details).   Patient presented to the emergency department today with flulike symptoms.  Differential would be broad.  Influenza negative.  Chest x-ray shows findings most consistent with bronchitis.  There is some concerns for possible early pneumonia.  I do think pneumonia less likely at this point however will cover with antibiotics.  At this point will discharge patient given low concern for sepsis.  Will encourage close primary care follow-up.   ____________________________________________   FINAL CLINICAL IMPRESSION(S) / ED DIAGNOSES  Final diagnoses:  Flu-like symptoms  Upper respiratory tract infection, unspecified type     Note: This dictation was prepared with Dragon dictation. Any transcriptional errors that result from this process are unintentional     Phineas Semen, MD 07/21/18 [phone]

## 2018-07-21 NOTE — ED Notes (Signed)
Pt given lunch tray.

## 2018-07-21 NOTE — ED Triage Notes (Signed)
PT arrived with complaints of generalized body aches and fever since Sunday. Pt has HIV and has not been taking medication for last 3 months. Pt reports no relief with OTC medication.

## 2018-07-21 NOTE — Discharge Instructions (Addendum)
Please seek medical attention for any high fevers, chest pain, shortness of breath, change in behavior, persistent vomiting, bloody stool or any other new or concerning symptoms.  

## 2018-07-21 NOTE — ED Notes (Signed)
Pt in room 17, labs along with lactic acid drawn in triage. Pt c/o generalized body aches with fever. Temp 99.2 here. Hx of hiv and has not been taking his medications per pt

## 2018-07-23 LAB — URINE CULTURE

## 2018-07-24 ENCOUNTER — Telehealth: Payer: Self-pay | Admitting: *Deleted

## 2018-07-24 NOTE — Telephone Encounter (Signed)
Patient called and advised he was just in the ED on Saturday 07/21/18. He advised he is still having a bad cough and feels really bad. He wants to be seen as the labs he had done while in the ED have him concerned he is very sick. Advised him not so but since he still feels bad he can come on Thursday 07/26/18 to see an NP. He accepted and will be here at 330.

## 2018-07-26 ENCOUNTER — Ambulatory Visit: Payer: BLUE CROSS/BLUE SHIELD | Admitting: Family

## 2018-07-26 LAB — CULTURE, BLOOD (ROUTINE X 2)
CULTURE: NO GROWTH
Culture: NO GROWTH
Special Requests: ADEQUATE
Special Requests: ADEQUATE

## 2018-09-09 IMAGING — CR DG CHEST 2V
1 series · 2 of 2 positions shown · non-contrast
Comparison: CT Abdomen and Pelvis 04/24/2016.

CLINICAL DATA: 41-year-old HIV positive male with cough.

EXAM:
CHEST  2 VIEW

[Series 1: dg chest 2 view · 0.14mm/px · 2 of 2 slices shown]
[im 1/2]
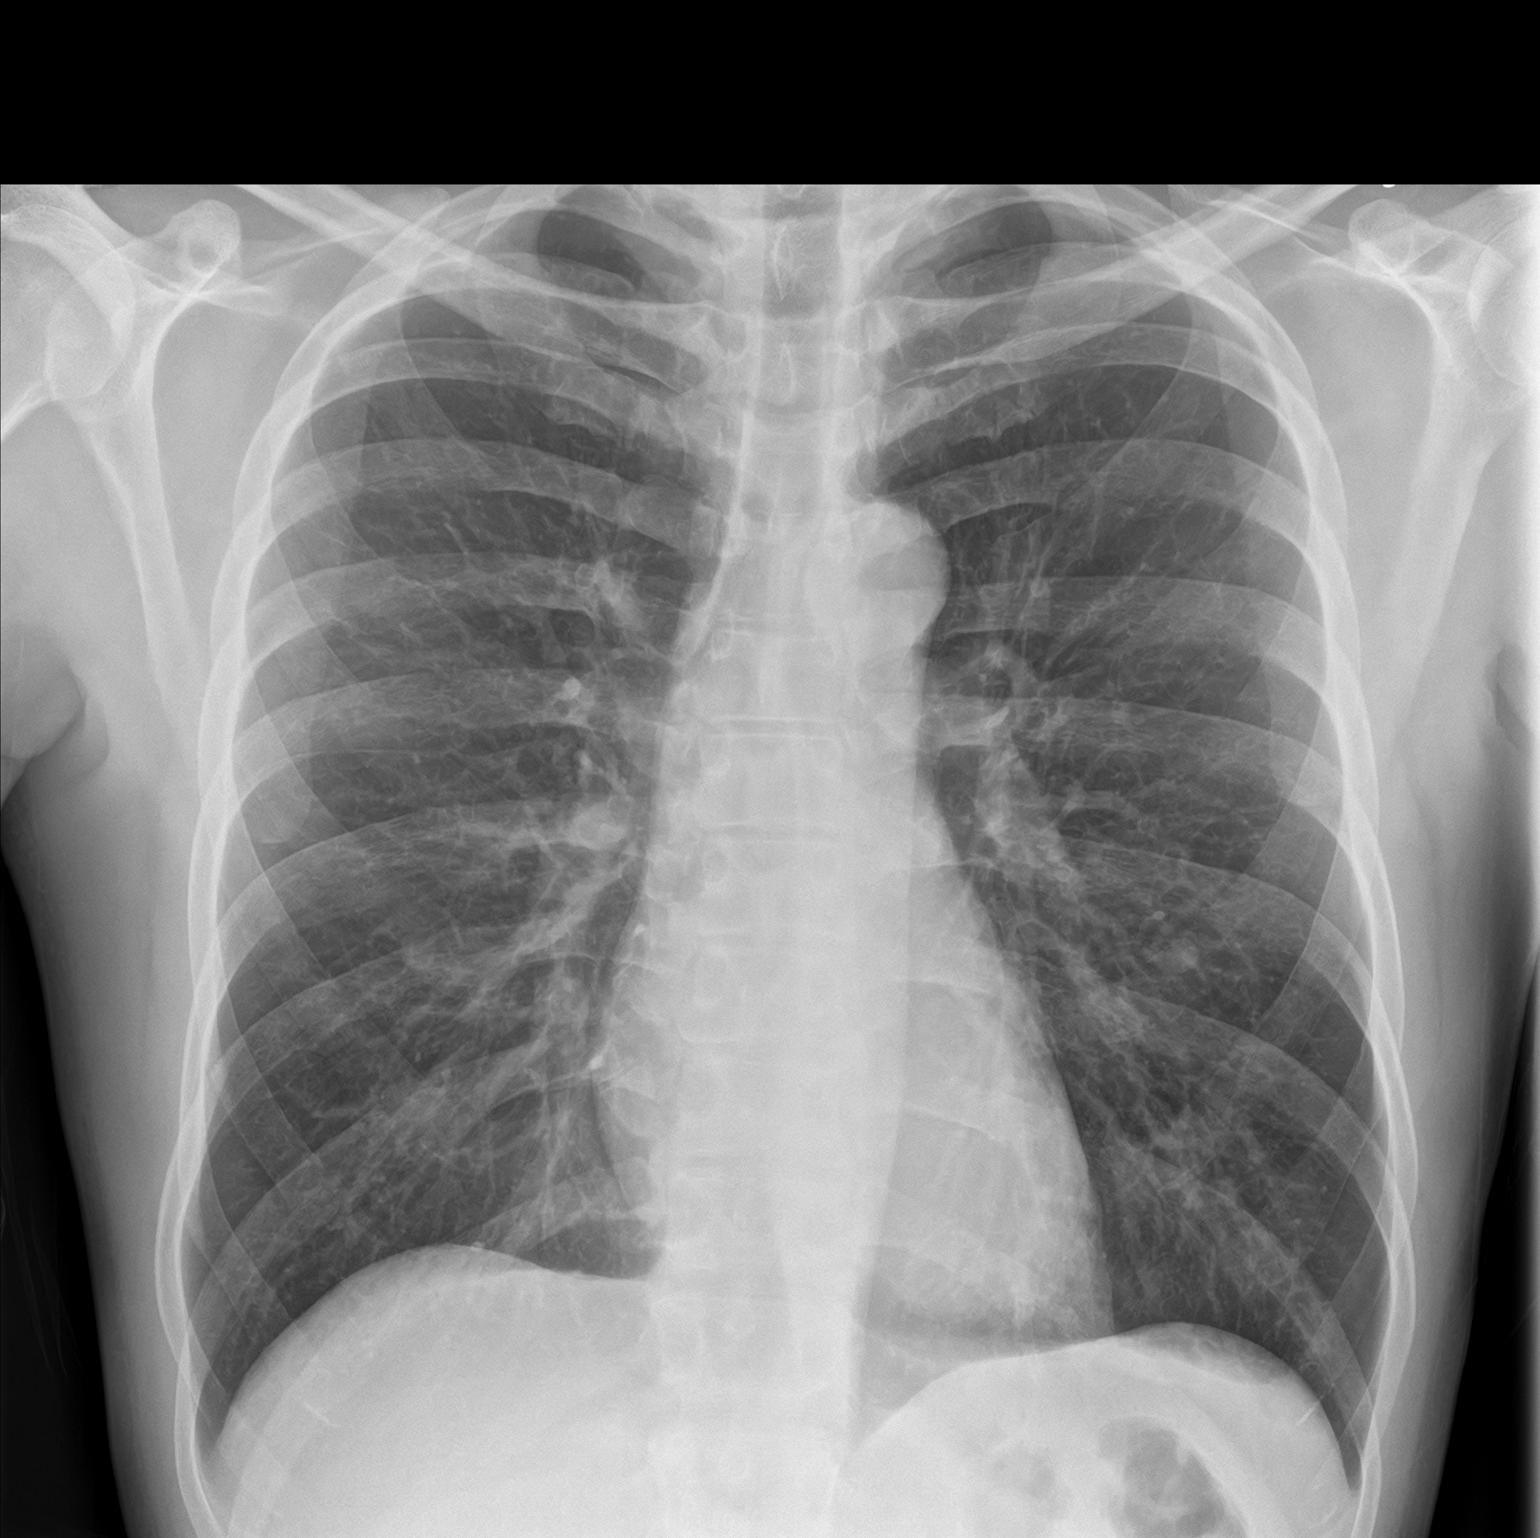
[im 2/2]
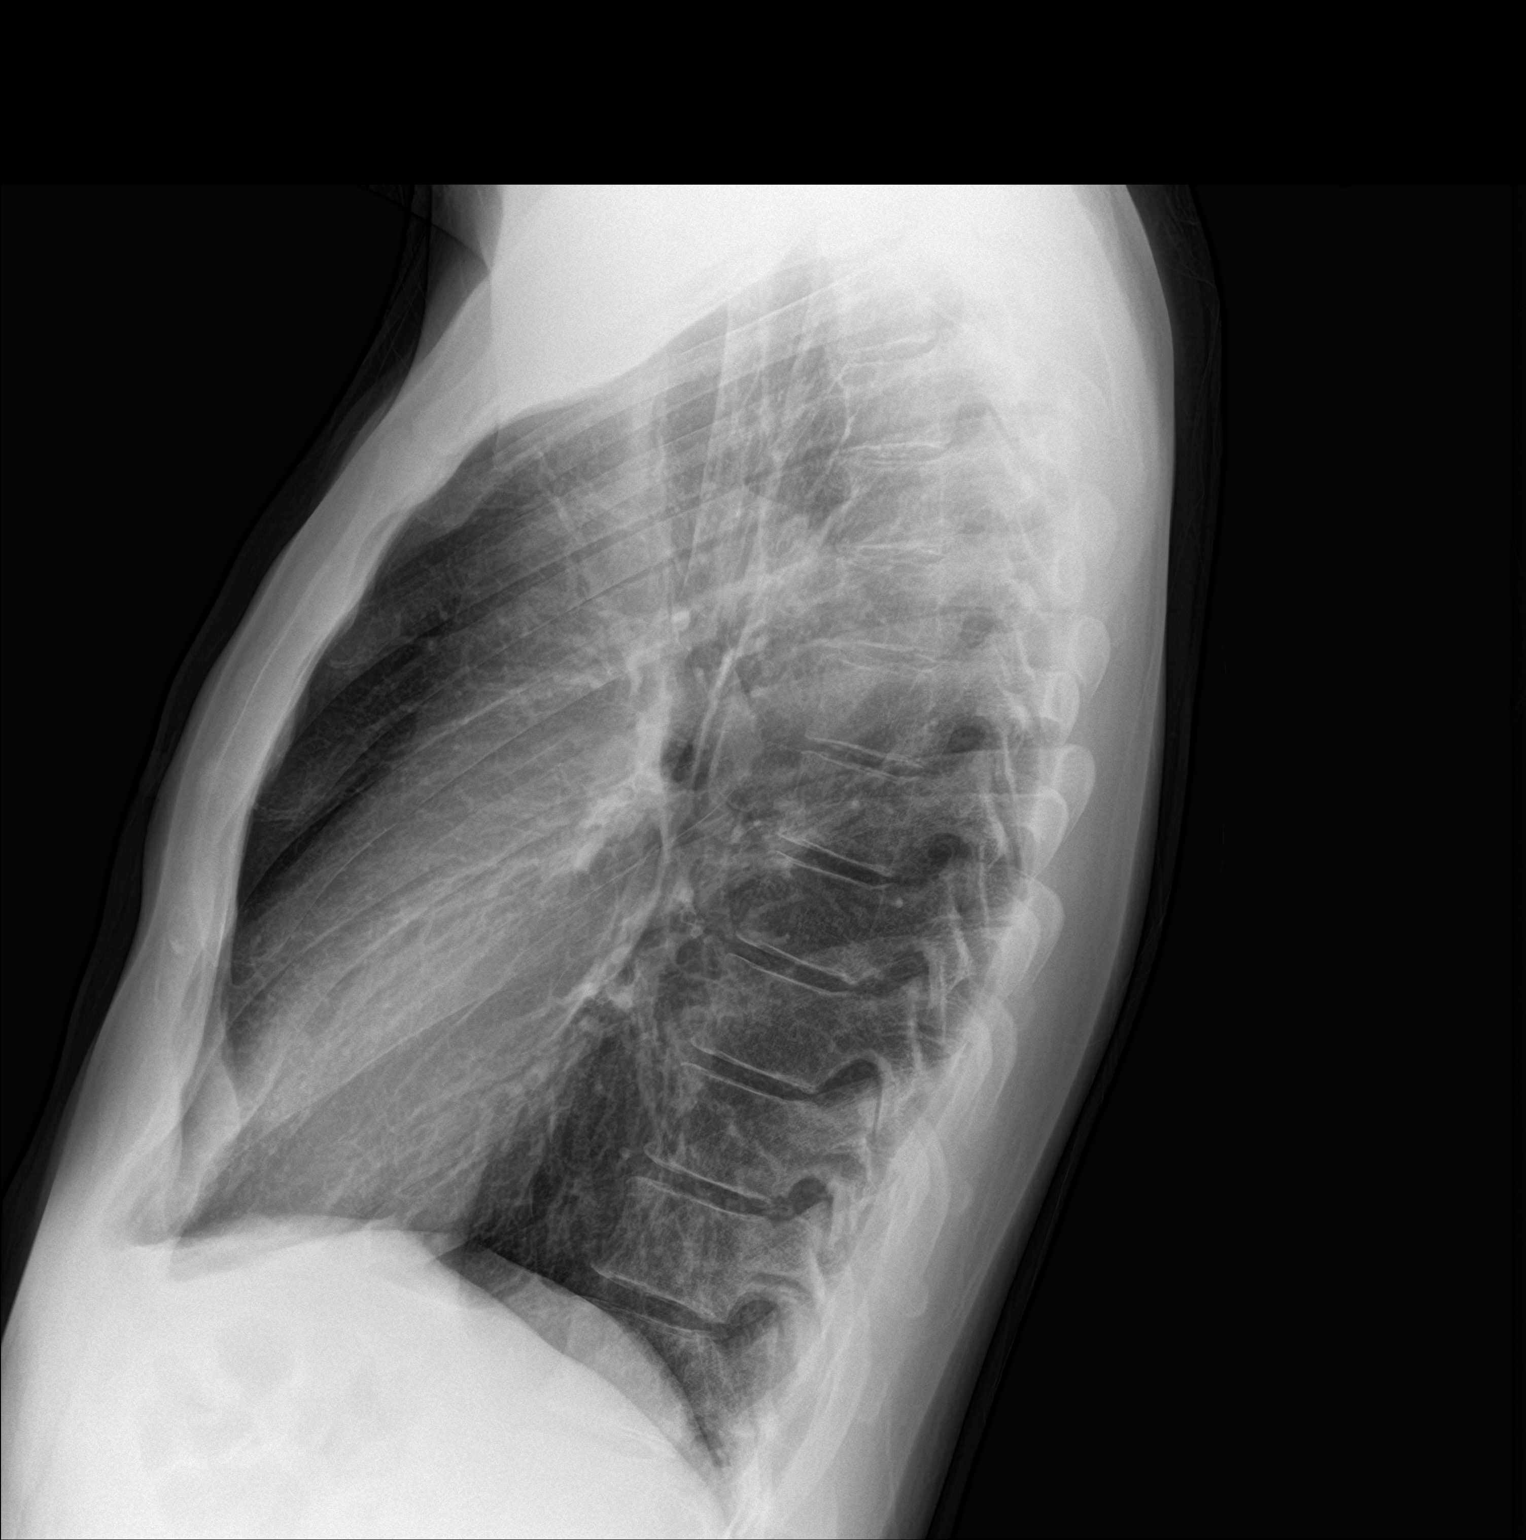

[2 of 2 positions shown; findings below may reference images not displayed]

FINDINGS: Normal lung volumes. Normal cardiac size and mediastinal contours.
Visualized tracheal air column is within normal limits. No
pneumothorax, pulmonary edema, pleural effusion or confluent
pulmonary opacity. No acute osseous abnormality identified. Negative
visible bowel gas pattern.
IMPRESSION: No acute cardiopulmonary abnormality.

## 2018-09-20 IMAGING — CR DG FOOT COMPLETE 3+V*R*
3 series · 3 of 3 positions shown · non-contrast
Comparison: None.

CLINICAL DATA: Right foot pain after dog bite

EXAM:
RIGHT FOOT COMPLETE - 3+ VIEW

[foot ap]
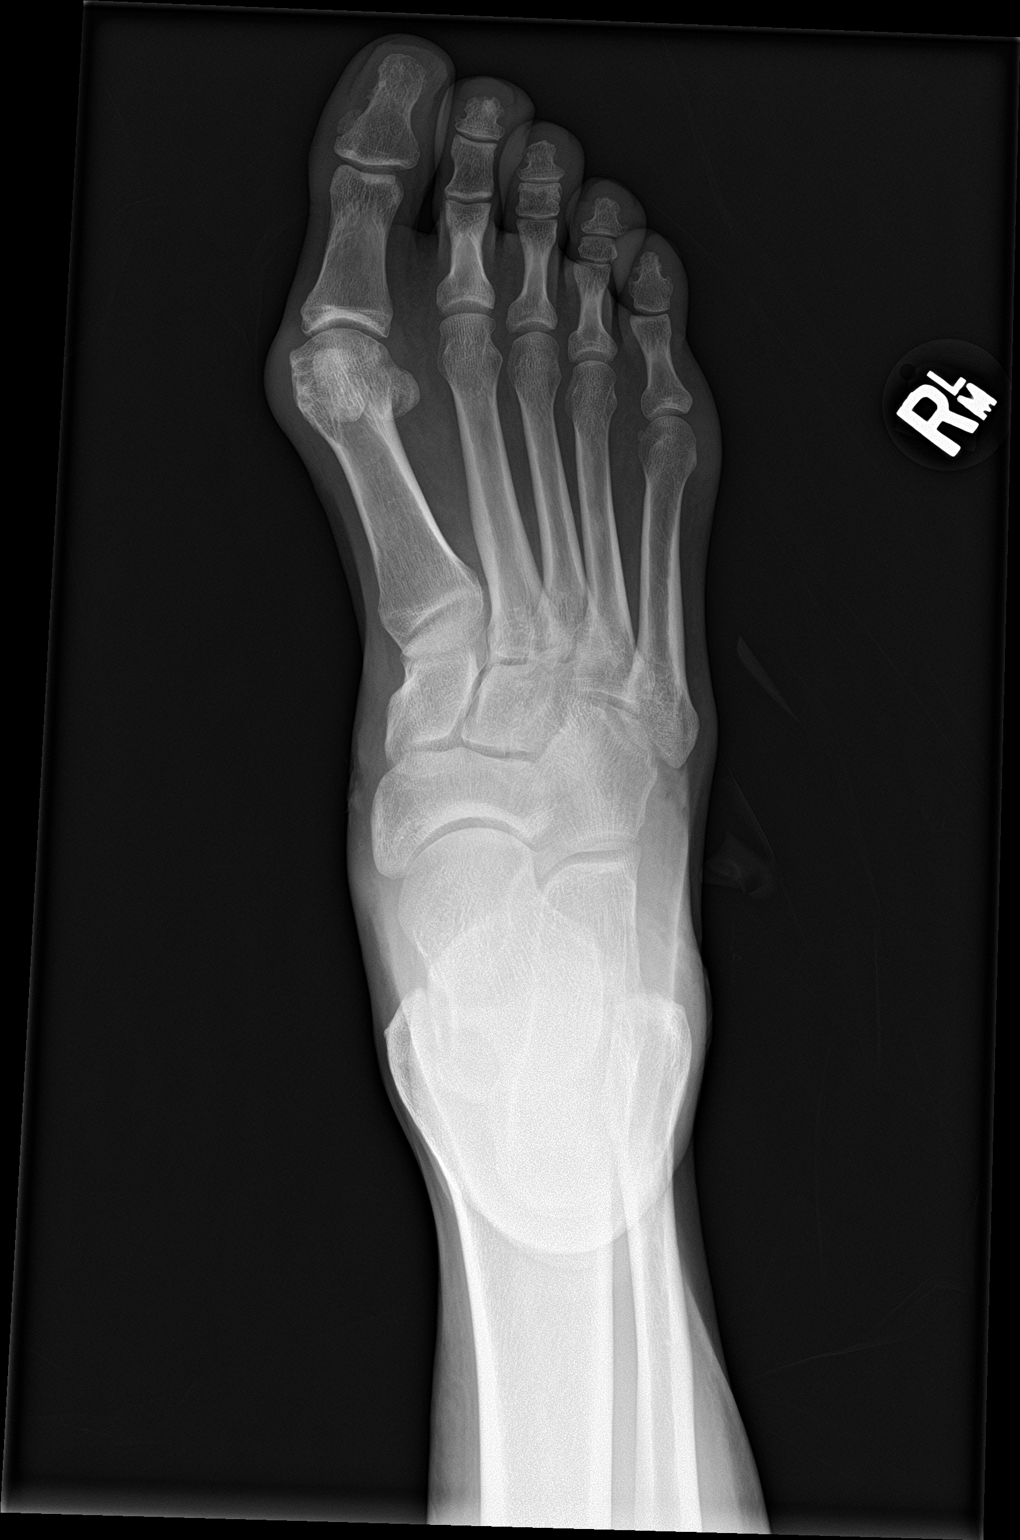

[foot obl]
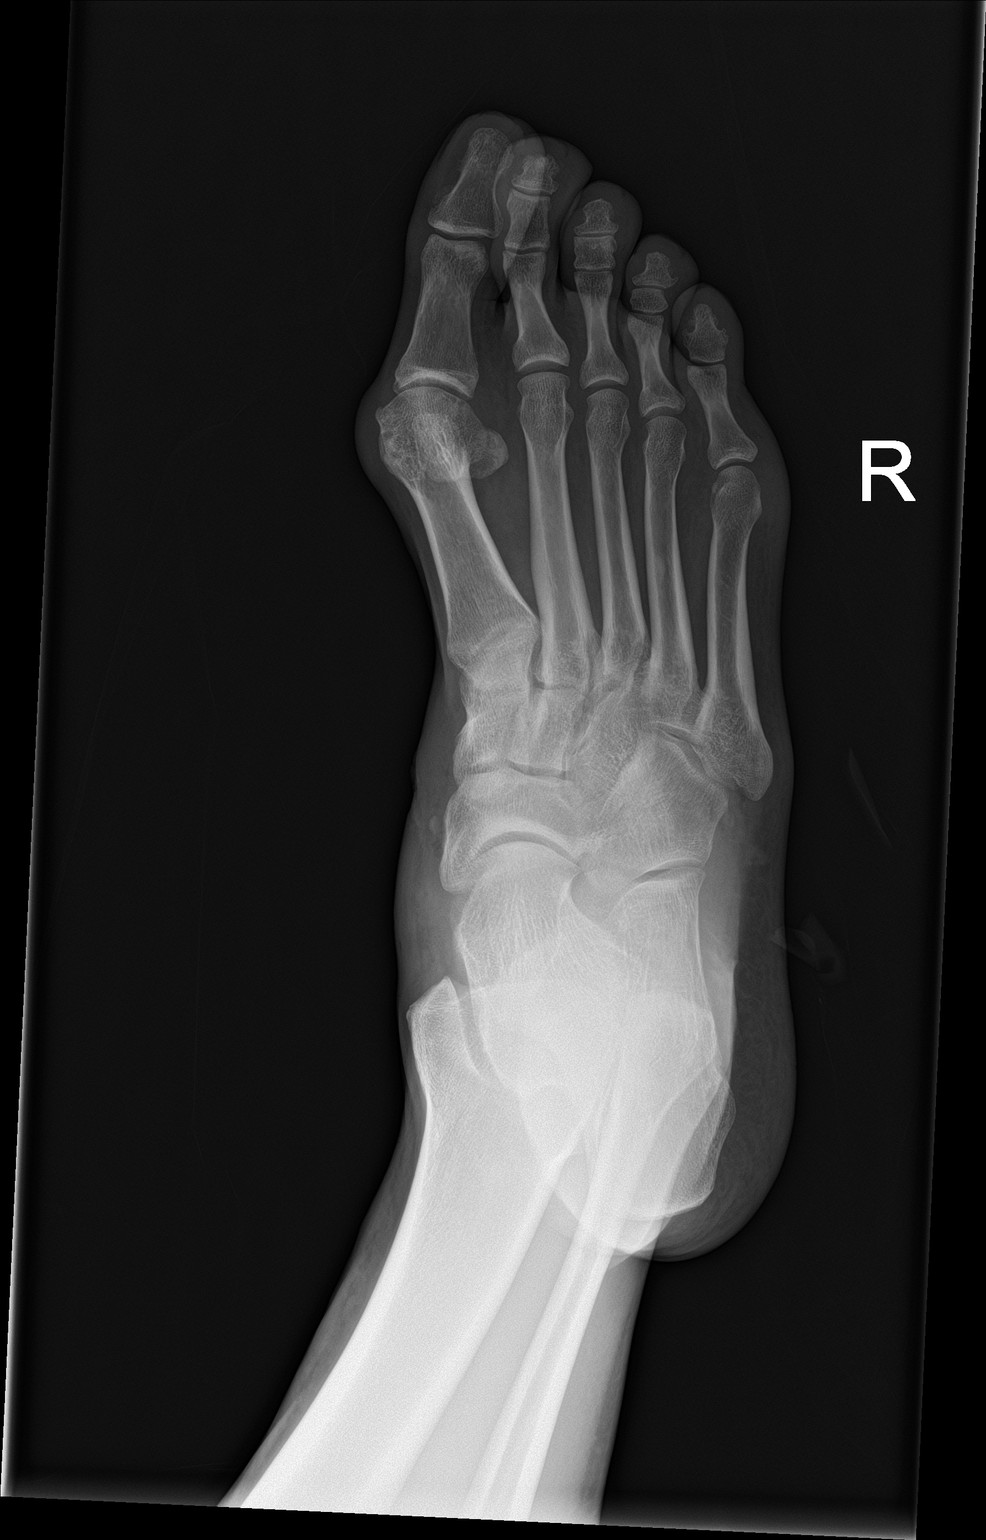

[foot lat]
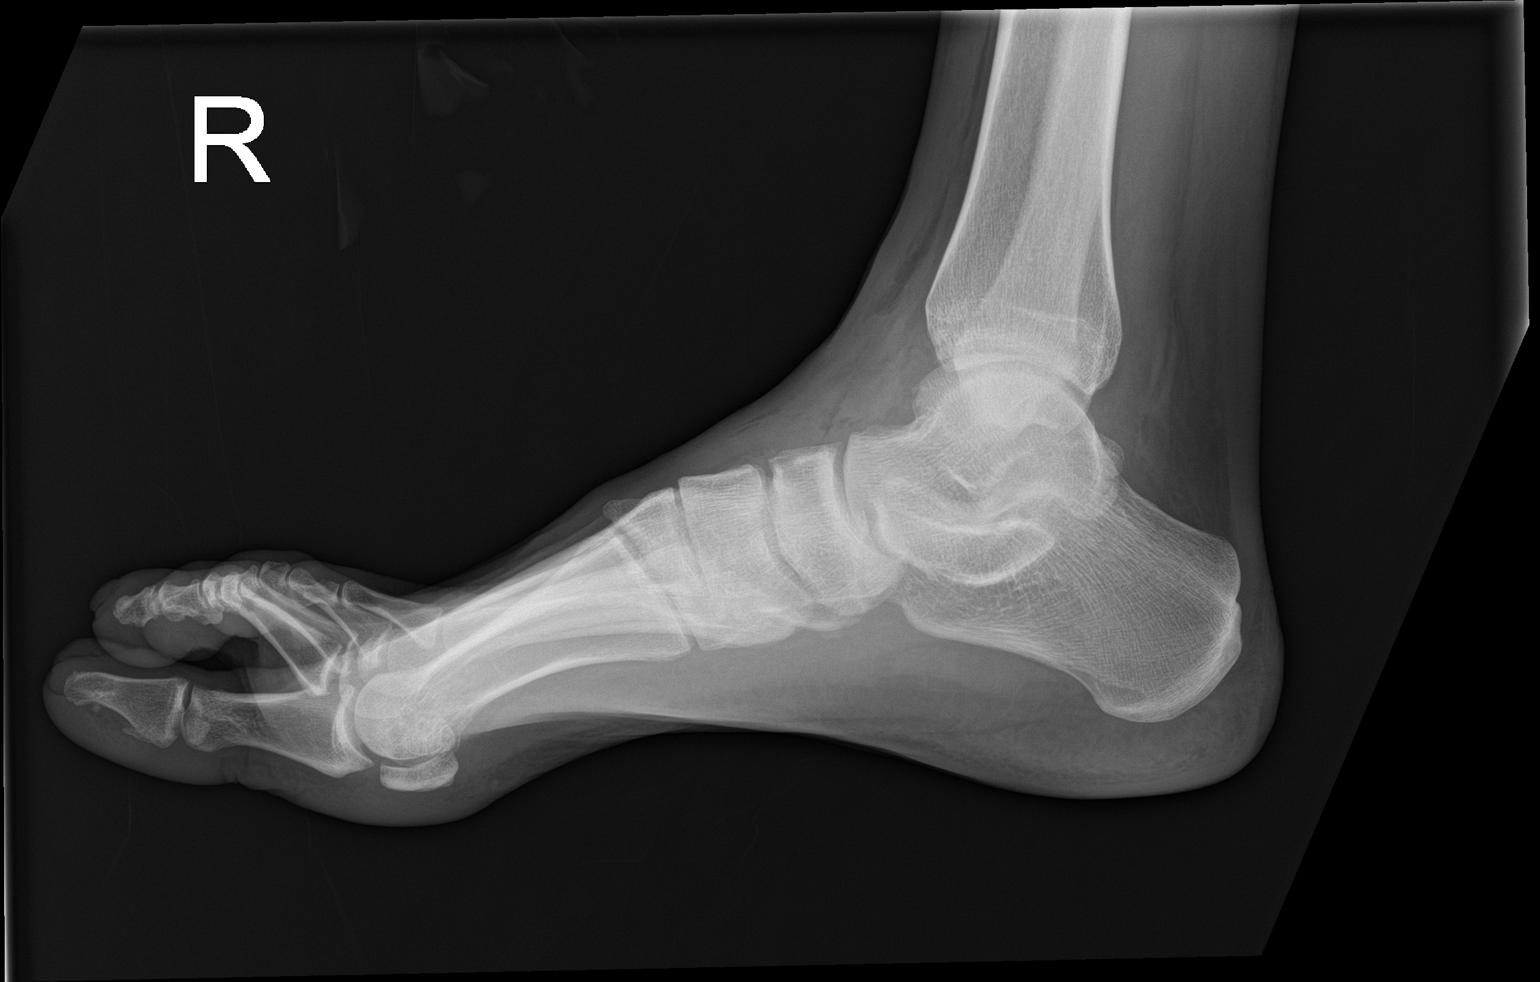

[3 of 3 positions shown; findings below may reference images not displayed]

FINDINGS: No fracture or dislocation is seen.

Hallux valgus deformity. Mild degenerative changes of the 1st MTP
joint.

Soft tissue lacerations along the medial and lateral aspects of the
midfoot. No radiopaque foreign body is seen.
IMPRESSION: Soft tissue lacerations along the medial and lateral aspect of the
midfoot.

No fracture, dislocation, or radiopaque foreign body is seen.

## 2018-10-07 ENCOUNTER — Emergency Department: Payer: BLUE CROSS/BLUE SHIELD

## 2018-10-07 ENCOUNTER — Other Ambulatory Visit: Payer: Self-pay

## 2018-10-07 ENCOUNTER — Emergency Department
Admission: EM | Admit: 2018-10-07 | Discharge: 2018-10-07 | Disposition: A | Discharge status: Other Acute Inpatient Hospital | Payer: BLUE CROSS/BLUE SHIELD | Attending: Emergency Medicine | Admitting: Emergency Medicine

## 2018-10-07 DIAGNOSIS — E872 Acidosis: Secondary | ICD-10-CM | POA: Diagnosis not present

## 2018-10-07 DIAGNOSIS — Y998 Other external cause status: Secondary | ICD-10-CM | POA: Diagnosis not present

## 2018-10-07 DIAGNOSIS — W260XXA Contact with knife, initial encounter: Secondary | ICD-10-CM | POA: Insufficient documentation

## 2018-10-07 DIAGNOSIS — R58 Hemorrhage, not elsewhere classified: Secondary | ICD-10-CM | POA: Diagnosis not present

## 2018-10-07 DIAGNOSIS — S50811A Abrasion of right forearm, initial encounter: Secondary | ICD-10-CM | POA: Diagnosis not present

## 2018-10-07 DIAGNOSIS — Y929 Unspecified place or not applicable: Secondary | ICD-10-CM | POA: Insufficient documentation

## 2018-10-07 DIAGNOSIS — F17210 Nicotine dependence, cigarettes, uncomplicated: Secondary | ICD-10-CM | POA: Diagnosis not present

## 2018-10-07 DIAGNOSIS — S1190XA Unspecified open wound of unspecified part of neck, initial encounter: Secondary | ICD-10-CM | POA: Diagnosis not present

## 2018-10-07 DIAGNOSIS — S41111A Laceration without foreign body of right upper arm, initial encounter: Secondary | ICD-10-CM | POA: Diagnosis not present

## 2018-10-07 DIAGNOSIS — R4182 Altered mental status, unspecified: Secondary | ICD-10-CM | POA: Diagnosis not present

## 2018-10-07 DIAGNOSIS — R404 Transient alteration of awareness: Secondary | ICD-10-CM | POA: Diagnosis not present

## 2018-10-07 DIAGNOSIS — T50901A Poisoning by unspecified drugs, medicaments and biological substances, accidental (unintentional), initial encounter: Secondary | ICD-10-CM | POA: Diagnosis not present

## 2018-10-07 DIAGNOSIS — S21119A Laceration without foreign body of unspecified front wall of thorax without penetration into thoracic cavity, initial encounter: Secondary | ICD-10-CM | POA: Diagnosis not present

## 2018-10-07 DIAGNOSIS — R0689 Other abnormalities of breathing: Secondary | ICD-10-CM | POA: Diagnosis not present

## 2018-10-07 DIAGNOSIS — S270XXA Traumatic pneumothorax, initial encounter: Secondary | ICD-10-CM | POA: Diagnosis not present

## 2018-10-07 DIAGNOSIS — S199XXA Unspecified injury of neck, initial encounter: Secondary | ICD-10-CM | POA: Diagnosis not present

## 2018-10-07 DIAGNOSIS — R918 Other nonspecific abnormal finding of lung field: Secondary | ICD-10-CM | POA: Diagnosis not present

## 2018-10-07 DIAGNOSIS — S299XXA Unspecified injury of thorax, initial encounter: Secondary | ICD-10-CM | POA: Diagnosis not present

## 2018-10-07 DIAGNOSIS — S31119A Laceration without foreign body of abdominal wall, unspecified quadrant without penetration into peritoneal cavity, initial encounter: Secondary | ICD-10-CM | POA: Diagnosis not present

## 2018-10-07 DIAGNOSIS — S0990XA Unspecified injury of head, initial encounter: Secondary | ICD-10-CM | POA: Diagnosis not present

## 2018-10-07 DIAGNOSIS — S1091XA Abrasion of unspecified part of neck, initial encounter: Secondary | ICD-10-CM | POA: Diagnosis not present

## 2018-10-07 DIAGNOSIS — Z79899 Other long term (current) drug therapy: Secondary | ICD-10-CM | POA: Diagnosis not present

## 2018-10-07 DIAGNOSIS — F1529 Other stimulant dependence with unspecified stimulant-induced disorder: Secondary | ICD-10-CM | POA: Diagnosis not present

## 2018-10-07 DIAGNOSIS — S4992XA Unspecified injury of left shoulder and upper arm, initial encounter: Secondary | ICD-10-CM | POA: Diagnosis not present

## 2018-10-07 DIAGNOSIS — S3993XA Unspecified injury of pelvis, initial encounter: Secondary | ICD-10-CM | POA: Diagnosis not present

## 2018-10-07 DIAGNOSIS — S3991XA Unspecified injury of abdomen, initial encounter: Secondary | ICD-10-CM | POA: Diagnosis not present

## 2018-10-07 DIAGNOSIS — L03311 Cellulitis of abdominal wall: Secondary | ICD-10-CM | POA: Diagnosis not present

## 2018-10-07 DIAGNOSIS — B20 Human immunodeficiency virus [HIV] disease: Secondary | ICD-10-CM | POA: Diagnosis not present

## 2018-10-07 DIAGNOSIS — S6991XA Unspecified injury of right wrist, hand and finger(s), initial encounter: Secondary | ICD-10-CM | POA: Diagnosis not present

## 2018-10-07 DIAGNOSIS — I4510 Unspecified right bundle-branch block: Secondary | ICD-10-CM | POA: Diagnosis not present

## 2018-10-07 DIAGNOSIS — S0191XA Laceration without foreign body of unspecified part of head, initial encounter: Secondary | ICD-10-CM | POA: Diagnosis not present

## 2018-10-07 DIAGNOSIS — S1191XA Laceration without foreign body of unspecified part of neck, initial encounter: Secondary | ICD-10-CM | POA: Diagnosis not present

## 2018-10-07 DIAGNOSIS — S41109A Unspecified open wound of unspecified upper arm, initial encounter: Secondary | ICD-10-CM | POA: Diagnosis not present

## 2018-10-07 DIAGNOSIS — G939 Disorder of brain, unspecified: Secondary | ICD-10-CM | POA: Diagnosis not present

## 2018-10-07 DIAGNOSIS — S50812A Abrasion of left forearm, initial encounter: Secondary | ICD-10-CM | POA: Insufficient documentation

## 2018-10-07 DIAGNOSIS — S3992XA Unspecified injury of lower back, initial encounter: Secondary | ICD-10-CM | POA: Diagnosis not present

## 2018-10-07 DIAGNOSIS — Y9389 Activity, other specified: Secondary | ICD-10-CM | POA: Insufficient documentation

## 2018-10-07 DIAGNOSIS — N390 Urinary tract infection, site not specified: Secondary | ICD-10-CM | POA: Diagnosis not present

## 2018-10-07 DIAGNOSIS — Z21 Asymptomatic human immunodeficiency virus [HIV] infection status: Secondary | ICD-10-CM | POA: Diagnosis not present

## 2018-10-07 DIAGNOSIS — S41112A Laceration without foreign body of left upper arm, initial encounter: Secondary | ICD-10-CM | POA: Diagnosis not present

## 2018-10-07 DIAGNOSIS — F329 Major depressive disorder, single episode, unspecified: Secondary | ICD-10-CM | POA: Insufficient documentation

## 2018-10-07 DIAGNOSIS — R0902 Hypoxemia: Secondary | ICD-10-CM | POA: Diagnosis not present

## 2018-10-07 DIAGNOSIS — S61511A Laceration without foreign body of right wrist, initial encounter: Secondary | ICD-10-CM | POA: Diagnosis not present

## 2018-10-07 DIAGNOSIS — S61512A Laceration without foreign body of left wrist, initial encounter: Secondary | ICD-10-CM | POA: Diagnosis not present

## 2018-10-07 DIAGNOSIS — F1490 Cocaine use, unspecified, uncomplicated: Secondary | ICD-10-CM | POA: Diagnosis not present

## 2018-10-07 DIAGNOSIS — B957 Other staphylococcus as the cause of diseases classified elsewhere: Secondary | ICD-10-CM | POA: Diagnosis not present

## 2018-10-07 DIAGNOSIS — X789XXA Intentional self-harm by unspecified sharp object, initial encounter: Secondary | ICD-10-CM | POA: Diagnosis not present

## 2018-10-07 DIAGNOSIS — Z8619 Personal history of other infectious and parasitic diseases: Secondary | ICD-10-CM | POA: Diagnosis not present

## 2018-10-07 DIAGNOSIS — T797XXA Traumatic subcutaneous emphysema, initial encounter: Secondary | ICD-10-CM | POA: Diagnosis not present

## 2018-10-07 DIAGNOSIS — R935 Abnormal findings on diagnostic imaging of other abdominal regions, including retroperitoneum: Secondary | ICD-10-CM | POA: Diagnosis not present

## 2018-10-07 DIAGNOSIS — S31109A Unspecified open wound of abdominal wall, unspecified quadrant without penetration into peritoneal cavity, initial encounter: Secondary | ICD-10-CM | POA: Insufficient documentation

## 2018-10-07 DIAGNOSIS — S27329A Contusion of lung, unspecified, initial encounter: Secondary | ICD-10-CM | POA: Diagnosis not present

## 2018-10-07 DIAGNOSIS — J982 Interstitial emphysema: Secondary | ICD-10-CM | POA: Diagnosis not present

## 2018-10-07 DIAGNOSIS — T07XXXA Unspecified multiple injuries, initial encounter: Secondary | ICD-10-CM | POA: Diagnosis not present

## 2018-10-07 LAB — CBC WITH DIFFERENTIAL/PLATELET
Abs Immature Granulocytes: 0.08 10*3/uL — ABNORMAL HIGH (ref 0.00–0.07)
BASOS ABS: 0 10*3/uL (ref 0.0–0.1)
Basophils Relative: 0 %
Eosinophils Absolute: 0 10*3/uL (ref 0.0–0.5)
Eosinophils Relative: 0 %
HEMATOCRIT: 43 % (ref 39.0–52.0)
HEMOGLOBIN: 14.7 g/dL (ref 13.0–17.0)
Immature Granulocytes: 1 %
LYMPHS PCT: 10 %
Lymphs Abs: 1.1 10*3/uL (ref 0.7–4.0)
MCH: 31.3 pg (ref 26.0–34.0)
MCHC: 34.2 g/dL (ref 30.0–36.0)
MCV: 91.7 fL (ref 80.0–100.0)
MONO ABS: 1.2 10*3/uL — AB (ref 0.1–1.0)
Monocytes Relative: 11 %
NEUTROS ABS: 8.8 10*3/uL — AB (ref 1.7–7.7)
Neutrophils Relative %: 78 %
Platelets: 212 10*3/uL (ref 150–400)
RBC: 4.69 MIL/uL (ref 4.22–5.81)
RDW: 13.9 % (ref 11.5–15.5)
WBC: 11.2 10*3/uL — AB (ref 4.0–10.5)
nRBC: 0 % (ref 0.0–0.2)

## 2018-10-07 LAB — COMPREHENSIVE METABOLIC PANEL
ALT: 56 U/L — AB (ref 0–44)
AST: 108 U/L — AB (ref 15–41)
Albumin: 4.1 g/dL (ref 3.5–5.0)
Alkaline Phosphatase: 72 U/L (ref 38–126)
Anion gap: 12 (ref 5–15)
BUN: 36 mg/dL — AB (ref 6–20)
CO2: 23 mmol/L (ref 22–32)
CREATININE: 2.38 mg/dL — AB (ref 0.61–1.24)
Calcium: 8.4 mg/dL — ABNORMAL LOW (ref 8.9–10.3)
Chloride: 98 mmol/L (ref 98–111)
GFR calc Af Amer: 38 mL/min — ABNORMAL LOW (ref >60)
GFR, EST NON AFRICAN AMERICAN: 32 mL/min — AB (ref >60)
GLUCOSE: 121 mg/dL — AB (ref 70–99)
POTASSIUM: 3.1 mmol/L — AB (ref 3.5–5.1)
Sodium: 133 mmol/L — ABNORMAL LOW (ref 135–145)
Total Bilirubin: 0.9 mg/dL (ref 0.3–1.2)
Total Protein: 8.5 g/dL — ABNORMAL HIGH (ref 6.5–8.1)

## 2018-10-07 MED ORDER — LORAZEPAM 2 MG/ML IJ SOLN
2.0000 mg | Freq: Once | INTRAMUSCULAR | Status: AC
  Administered 2018-10-07: 2 mg via INTRAVENOUS

## 2018-10-07 MED ORDER — LORAZEPAM 2 MG/ML IJ SOLN
INTRAMUSCULAR | Status: AC
  Administered 2018-10-07: 2 mg via INTRAVENOUS
  Filled 2018-10-07: qty 1

## 2018-10-07 MED ORDER — HALOPERIDOL LACTATE 5 MG/ML IJ SOLN
5.0000 mg | Freq: Once | INTRAMUSCULAR | Status: AC
  Administered 2018-10-07: 5 mg via INTRAVENOUS

## 2018-10-07 MED ORDER — HALOPERIDOL LACTATE 5 MG/ML IJ SOLN
INTRAMUSCULAR | Status: AC
  Administered 2018-10-07: 5 mg via INTRAVENOUS
  Filled 2018-10-07: qty 1

## 2018-10-07 NOTE — ED Notes (Signed)
Pt transported [facility] with [facility] following d/t pt under IVC. All belongings sent with patient (except for blood stained kitchen knife taken by [employer]), including 2 [employer] debit cards, one key on a carabiner clip, pants, underwear, socks, jacket.

## 2018-10-07 NOTE — ED Notes (Signed)
EMTALA reviewed by this RN.  No signature required d/t IVC status.

## 2018-10-07 NOTE — ED Triage Notes (Signed)
Pt to [facility] via [facility]. Pt is moving uncontrollably and talking non-stop. Penetrating wound to central  Torso approx 3 inches below nipple line medially. VSS and NAD at this time . [employer] reports pt is coming down from meth taken last nght

## 2018-10-07 NOTE — ED Notes (Signed)
Multiple lacerations around neck, already scabbing up  Wound in chest has small amount of blood pooled  Pt states all wounds are from today

## 2018-10-07 NOTE — ED Provider Notes (Addendum)
[facility] Emergency Department Provider Note   ____________________________________________   First MD Initiated Contact with Patient 10/07/18 2147     (approximate)  I have reviewed the triage vital signs and the nursing notes.   HISTORY  Chief Complaint Puncture Wound  Patient is fairly agitated and restless and cannot give me a good history because of the drug use he has had.  HPI Gabriel [name] is a 42 y.o. male patient coming down off of drugs possibly meth.  Has been scratching himself with a kitchen knife has multiple superficial lacerations on both arms and both sides of his neck.  He is also been scratching at a sewed up wound on the left side of his neck.  That is now open and fairly deep I cannot see the bottom of it.  Possibly zone 2 patient also has a small round hole just below the xiphoid in his belly that he is apparently been drilling into himself with a knife tip.  That is bleeding slightly.   Past Medical History:  Diagnosis Date  . Abscess    buttocks  . Cellulitis   . HIV positive (HCC)   . MRSA infection     Patient Active Problem List   Diagnosis Date Noted  . Genital warts 06/27/2017  . IVDU (intravenous drug user) 06/27/2017  . Depression 06/27/2017  . Hematochezia 03/26/2015  . Dermatitis 06/10/2014  . Dyslipidemia 04/14/2011  . Blood in stool 03/20/2009  . 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 DENTAL CARIES 01/02/2009  . TOBACCO USER 05/27/2008  . INSOMNIA, CHRONIC 05/27/2008  . Human immunodeficiency virus (HIV) disease (HCC) 11/28/2006  . SYPHILIS NOS 11/28/2006    Past Surgical History:  Procedure Laterality Date  . HAND / FINGER LESION EXCISION      Prior to Admission medications   Medication Sig Start Date End Date Taking? Authorizing Provider  amoxicillin-clavulanate (AUGMENTIN) 875-125 MG tablet Take 1 tablet by mouth 2 (two) times daily. 12/07/17   Fisher, Roselyn Bering, PA-C    darunavir-cobicistat (PREZCOBIX) 800-150 MG tablet Take 1 tablet by mouth daily with supper. Swallow whole. Do NOT crush, break or chew tablets. Take with food. 06/27/17   Cliffton Asters, MD  emtricitabine-tenofovir AF (DESCOVY) 200-25 MG tablet Take 1 tablet by mouth daily. 06/27/17   Cliffton Asters, MD  ibuprofen (ADVIL,MOTRIN) 800 MG tablet Take 1 tablet (800 mg total) by mouth every 8 (eight) hours as needed (pain). 12/04/15   Hagler, Jami L, PA-C  ondansetron (ZOFRAN ODT) 4 MG disintegrating tablet Take 1 tablet (4 mg total) by mouth every 8 (eight) hours as needed for nausea or vomiting. 11/26/17   Jene Every, MD  traMADol (ULTRAM) 50 MG tablet Take 1 tablet (50 mg total) by mouth every 6 (six) hours as needed. 12/07/17   Faythe Ghee, PA-C    Allergies Abacavir  Family History  Problem Relation Age of Onset  . Hypertension Mother   . Cancer Maternal Grandmother   . Crohn's disease Brother     Social History Social History   Tobacco Use  . Smoking status: Current Every Day Smoker    Packs/day: 1.00    Types: Cigarettes  . Smokeless tobacco: Never Used  Substance Use Topics  . Alcohol use: Yes    Alcohol/week: 3.0 standard drinks    Types: 3 Standard drinks or equivalent per week    Comment: socially   . Drug use: No    Types: Cocaine, IV, Methamphetamines  Comment: only meth at this point    Review of Systems  Unable to obtain ____________________________________________   PHYSICAL EXAM:  VITAL SIGNS: ED Triage Vitals  Enc Vitals Group     BP      Pulse      Resp      Temp      Temp src      SpO2      Weight      Height      Head Circumference      Peak Flow      Pain Score      Pain Loc      Pain Edu?      Excl. in GC?     Constitutional: Alert and oriented at least to person.  Is very restless and agitated will not hold still cannot give me a good history.. Eyes: Conjunctivae are normal. PERRL. EOMI. Head: Atraumatic. Nose: No  congestion/rhinnorhea. Mouth/Throat: Mucous membranes are moist.  Oropharynx non-erythematous. Neck: No stridor.  Patient has superficial abrasions to both sides of the neck he has a deeper 2-1/2 inch long vertical laceration in the area of the external jugular.  This is not bleeding but it is at least a half an inch deep.   Cardiovascular: Normal rate, regular rhythm. Grossly normal heart sounds.  Good peripheral circulation. Respiratory: Normal respiratory effort.  No retractions. Lungs CTAB. Gastrointestinal: Soft and not apparently nontender. No distention. No abdominal bruits.  He does have the wound in the middle of his upper abdomen as described in HPI Musculoskeletal: No lower extremity tenderness nor edema.  He has multiple superficial abrasions across both forearms there is a deeper one on the left wrist palmar surface this is clotted it is difficult to tell how deep it is Neurologic: Agitated speech and language patient is moving all extremities equally and well Skin:  Skin is warm, dry and intact except as noted above. No rash noted.   ____________________________________________   LABS (all labs ordered are listed, but only abnormal results are displayed)  Labs Reviewed  COMPREHENSIVE METABOLIC PANEL  CBC WITH DIFFERENTIAL/PLATELET  URINALYSIS, COMPLETE (UACMP) WITH MICROSCOPIC  URINE DRUG SCREEN, QUALITATIVE ([facility] ONLY)   ____________________________________________  EKG  KG read and interpreted by me shows sinus tachycardia rate of 114 normal axis nonspecific ST-T wave changes QTC is 522 ms per computer. ____________________________________________  RADIOLOGY  ED MD interpretation:  Official radiology report(s): No results found.  ____________________________________________   PROCEDURES  Procedure(s) performed:   Procedures  Critical care time 20 minutes.  This includes  evaluating the patient reevaluating the patient evaluating his lab work  then  arranging his transfer.  ____________________________________________   INITIAL IMPRESSION / ASSESSMENT AND PLAN / ED COURSE   Discussed briefly with Dr. Cyril Loosen who is in the room with me.  We feel this neck wound is fairly deep and there was not actively bleeding needs further evaluation.  Same with the belly wound.  We will call [facility] trauma service to try and arrange transport of this gentleman before we attempt CT.  He needs to be sedated.  Dr. Bosie Clos at [facility] ER accepts the patient as a trauma.  If we can get him to hold still long enough to evaluate him further we will try to do that.   cxr returns w/ pneumomediastinum, air in neck too. Pt's pulse remains at 103 BP 107 sys w/ him resping. Pt received 8 ativan (1-2 mg at a time) and 10 haldol ( 5  then 2.5 x 2) for sedation. Is still easily stirred up and will flail arms and legs. Dr Lavonia DraftsScholer informed.    ____________________________________________   FINAL CLINICAL IMPRESSION(S) / ED DIAGNOSES  Final diagnoses:  Multiple abrasions  Open neck wound, initial encounter  Open wound of abdominal wall, initial encounter  Wrist laceration, left, initial encounter     ED Discharge Orders    None       Note:  This document was prepared using Dragon voice recognition software and may include unintentional dictation errors.    Arnaldo NatalMalinda, Kalila Adkison F, MD 10/07/18 2250    Arnaldo NatalMalinda, Horrace Hanak F, MD 10/22/18 1646    Arnaldo NatalMalinda, Gregoire Bennis F, MD 10/22/18 [phone]

## 2018-10-08 DIAGNOSIS — S1191XA Laceration without foreign body of unspecified part of neck, initial encounter: Secondary | ICD-10-CM | POA: Diagnosis not present

## 2018-10-08 DIAGNOSIS — Z8619 Personal history of other infectious and parasitic diseases: Secondary | ICD-10-CM | POA: Diagnosis not present

## 2018-10-08 DIAGNOSIS — R4182 Altered mental status, unspecified: Secondary | ICD-10-CM | POA: Diagnosis not present

## 2018-10-08 DIAGNOSIS — G939 Disorder of brain, unspecified: Secondary | ICD-10-CM | POA: Diagnosis not present

## 2018-10-08 DIAGNOSIS — R0689 Other abnormalities of breathing: Secondary | ICD-10-CM | POA: Diagnosis not present

## 2018-10-08 DIAGNOSIS — S27329A Contusion of lung, unspecified, initial encounter: Secondary | ICD-10-CM | POA: Diagnosis not present

## 2018-10-08 DIAGNOSIS — X789XXA Intentional self-harm by unspecified sharp object, initial encounter: Secondary | ICD-10-CM | POA: Diagnosis not present

## 2018-10-08 DIAGNOSIS — E872 Acidosis: Secondary | ICD-10-CM | POA: Diagnosis not present

## 2018-10-08 DIAGNOSIS — S270XXA Traumatic pneumothorax, initial encounter: Secondary | ICD-10-CM | POA: Diagnosis not present

## 2018-10-08 DIAGNOSIS — B20 Human immunodeficiency virus [HIV] disease: Secondary | ICD-10-CM | POA: Diagnosis not present

## 2018-10-08 DIAGNOSIS — T797XXA Traumatic subcutaneous emphysema, initial encounter: Secondary | ICD-10-CM | POA: Diagnosis not present

## 2018-10-09 ENCOUNTER — Telehealth: Payer: Self-pay | Admitting: Behavioral Health

## 2018-10-09 DIAGNOSIS — I4510 Unspecified right bundle-branch block: Secondary | ICD-10-CM | POA: Diagnosis not present

## 2018-10-09 DIAGNOSIS — E872 Acidosis: Secondary | ICD-10-CM | POA: Diagnosis not present

## 2018-10-09 DIAGNOSIS — R4182 Altered mental status, unspecified: Secondary | ICD-10-CM | POA: Diagnosis not present

## 2018-10-09 DIAGNOSIS — T07XXXA Unspecified multiple injuries, initial encounter: Secondary | ICD-10-CM | POA: Diagnosis not present

## 2018-10-09 DIAGNOSIS — S27329A Contusion of lung, unspecified, initial encounter: Secondary | ICD-10-CM | POA: Diagnosis not present

## 2018-10-09 DIAGNOSIS — T797XXA Traumatic subcutaneous emphysema, initial encounter: Secondary | ICD-10-CM | POA: Diagnosis not present

## 2018-10-09 DIAGNOSIS — S1191XA Laceration without foreign body of unspecified part of neck, initial encounter: Secondary | ICD-10-CM | POA: Diagnosis not present

## 2018-10-09 DIAGNOSIS — S270XXA Traumatic pneumothorax, initial encounter: Secondary | ICD-10-CM | POA: Diagnosis not present

## 2018-10-09 NOTE — Telephone Encounter (Signed)
Psychiatry resident from [facility] called stating patient was inpatient and would like to receive outpatient drug and alcohol abuse treatment outpatient.  Spoke to [place] and she suggested the [facility] here in [place] for the patient. Angeline SlimAshley Hill RN

## 2018-10-10 DIAGNOSIS — S270XXA Traumatic pneumothorax, initial encounter: Secondary | ICD-10-CM | POA: Diagnosis not present

## 2018-10-10 DIAGNOSIS — T07XXXA Unspecified multiple injuries, initial encounter: Secondary | ICD-10-CM | POA: Diagnosis not present

## 2018-10-10 DIAGNOSIS — S41112A Laceration without foreign body of left upper arm, initial encounter: Secondary | ICD-10-CM | POA: Diagnosis not present

## 2018-10-10 DIAGNOSIS — S41111A Laceration without foreign body of right upper arm, initial encounter: Secondary | ICD-10-CM | POA: Diagnosis not present

## 2018-10-10 DIAGNOSIS — S1191XA Laceration without foreign body of unspecified part of neck, initial encounter: Secondary | ICD-10-CM | POA: Diagnosis not present

## 2018-10-10 DIAGNOSIS — X789XXA Intentional self-harm by unspecified sharp object, initial encounter: Secondary | ICD-10-CM | POA: Diagnosis not present

## 2018-10-10 DIAGNOSIS — B20 Human immunodeficiency virus [HIV] disease: Secondary | ICD-10-CM | POA: Diagnosis not present

## 2018-10-10 DIAGNOSIS — Z8619 Personal history of other infectious and parasitic diseases: Secondary | ICD-10-CM | POA: Diagnosis not present

## 2018-10-10 DIAGNOSIS — G939 Disorder of brain, unspecified: Secondary | ICD-10-CM | POA: Diagnosis not present

## 2018-10-10 DIAGNOSIS — T797XXA Traumatic subcutaneous emphysema, initial encounter: Secondary | ICD-10-CM | POA: Diagnosis not present

## 2018-10-11 DIAGNOSIS — F17210 Nicotine dependence, cigarettes, uncomplicated: Secondary | ICD-10-CM | POA: Diagnosis not present

## 2018-10-11 DIAGNOSIS — T07XXXA Unspecified multiple injuries, initial encounter: Secondary | ICD-10-CM | POA: Diagnosis not present

## 2018-10-11 DIAGNOSIS — S21119A Laceration without foreign body of unspecified front wall of thorax without penetration into thoracic cavity, initial encounter: Secondary | ICD-10-CM | POA: Diagnosis not present

## 2018-10-11 DIAGNOSIS — T797XXA Traumatic subcutaneous emphysema, initial encounter: Secondary | ICD-10-CM | POA: Diagnosis not present

## 2018-10-11 DIAGNOSIS — S41111A Laceration without foreign body of right upper arm, initial encounter: Secondary | ICD-10-CM | POA: Diagnosis not present

## 2018-10-11 DIAGNOSIS — S41112A Laceration without foreign body of left upper arm, initial encounter: Secondary | ICD-10-CM | POA: Diagnosis not present

## 2018-10-11 DIAGNOSIS — S1191XA Laceration without foreign body of unspecified part of neck, initial encounter: Secondary | ICD-10-CM | POA: Diagnosis not present

## 2018-10-11 MED ORDER — BACITRACIN 500 UNIT/GM EX OINT
TOPICAL_OINTMENT | CUTANEOUS | Status: DC
Start: 2018-10-12 — End: 2018-10-11

## 2018-10-11 MED ORDER — EMTRICITABINE-TENOFOVIR AF 200-25 MG PO TABS
1.00 | ORAL_TABLET | ORAL | Status: DC
Start: 2018-10-12 — End: 2018-10-11

## 2018-10-11 MED ORDER — GENERIC EXTERNAL MEDICATION
Status: DC
Start: 2018-10-12 — End: 2018-10-11

## 2018-10-11 MED ORDER — ENOXAPARIN SODIUM 30 MG/0.3ML ~~LOC~~ SOLN
30.00 | SUBCUTANEOUS | Status: DC
Start: 2018-10-11 — End: 2018-10-11

## 2018-10-11 MED ORDER — CEPHALEXIN 500 MG PO CAPS
500.00 | ORAL_CAPSULE | ORAL | Status: DC
Start: 2018-10-11 — End: 2018-10-11

## 2018-10-11 MED ORDER — CARBOXYMETHYLCELLULOSE SOD PF 1 % OP GEL
2.00 | OPHTHALMIC | Status: DC
Start: 2018-10-11 — End: 2018-10-11

## 2018-10-11 MED ORDER — GENERIC EXTERNAL MEDICATION
5.00 | Status: DC
Start: ? — End: 2018-10-11

## 2018-10-11 MED ORDER — THIAMINE HCL 100 MG PO TABS
100.00 | ORAL_TABLET | ORAL | Status: DC
Start: 2018-10-12 — End: 2018-10-11

## 2018-10-11 MED ORDER — NICOTINE 14 MG/24HR TD PT24
1.00 | MEDICATED_PATCH | TRANSDERMAL | Status: DC
Start: 2018-10-12 — End: 2018-10-11

## 2018-10-11 MED ORDER — POLYETHYLENE GLYCOL 3350 17 G PO PACK
17.00 | PACK | ORAL | Status: DC
Start: ? — End: 2018-10-11

## 2018-10-11 MED ORDER — DOCUSATE SODIUM 100 MG PO CAPS
100.00 | ORAL_CAPSULE | ORAL | Status: DC
Start: 2018-10-11 — End: 2018-10-11

## 2018-10-11 MED ORDER — ACETAMINOPHEN 325 MG PO TABS
650.00 | ORAL_TABLET | ORAL | Status: DC
Start: ? — End: 2018-10-11

## 2018-10-11 MED ORDER — PNV PRENATAL PLUS MULTIVITAMIN 27-1 MG PO TABS
1.00 | ORAL_TABLET | ORAL | Status: DC
Start: 2018-10-12 — End: 2018-10-11

## 2018-10-15 ENCOUNTER — Ambulatory Visit (INDEPENDENT_AMBULATORY_CARE_PROVIDER_SITE_OTHER): Payer: BLUE CROSS/BLUE SHIELD | Admitting: Internal Medicine

## 2018-10-15 ENCOUNTER — Encounter: Payer: Self-pay | Admitting: Internal Medicine

## 2018-10-15 VITALS — BP 122/78 | HR 89 | Temp 98.0°F | Ht 76.0 in | Wt 191.0 lb

## 2018-10-15 DIAGNOSIS — F17210 Nicotine dependence, cigarettes, uncomplicated: Secondary | ICD-10-CM

## 2018-10-15 DIAGNOSIS — Z888 Allergy status to other drugs, medicaments and biological substances status: Secondary | ICD-10-CM

## 2018-10-15 DIAGNOSIS — Z915 Personal history of self-harm: Secondary | ICD-10-CM

## 2018-10-15 DIAGNOSIS — A630 Anogenital (venereal) warts: Secondary | ICD-10-CM

## 2018-10-15 DIAGNOSIS — Z23 Encounter for immunization: Secondary | ICD-10-CM

## 2018-10-15 DIAGNOSIS — F1510 Other stimulant abuse, uncomplicated: Secondary | ICD-10-CM | POA: Diagnosis not present

## 2018-10-15 DIAGNOSIS — Z6822 Body mass index (BMI) 22.0-22.9, adult: Secondary | ICD-10-CM | POA: Diagnosis not present

## 2018-10-15 DIAGNOSIS — B20 Human immunodeficiency virus [HIV] disease: Secondary | ICD-10-CM | POA: Diagnosis not present

## 2018-10-15 DIAGNOSIS — Z5189 Encounter for other specified aftercare: Secondary | ICD-10-CM | POA: Diagnosis not present

## 2018-10-15 DIAGNOSIS — F329 Major depressive disorder, single episode, unspecified: Secondary | ICD-10-CM | POA: Diagnosis not present

## 2018-10-15 DIAGNOSIS — F3340 Major depressive disorder, recurrent, in remission, unspecified: Secondary | ICD-10-CM

## 2018-10-15 MED ORDER — DARUN-COBIC-EMTRICIT-TENOFAF 800-150-200-10 MG PO TABS: 1.0000 | ORAL_TABLET | Freq: Every day | ORAL | 11 refills | Status: DC

## 2018-10-15 NOTE — Assessment & Plan Note (Signed)
I encouraged him to schedule a follow-up visit with his general surgeon as soon as possible.

## 2018-10-15 NOTE — Assessment & Plan Note (Signed)
He says that he was put back on antiretroviral therapy while hospitalized from 10/07/2018 until 10/11/2018.  Viral load obtained on 10/11/2018 was relatively low at 644 but his CD4 count had fallen to 223.  He is now willing to restart therapy.  I will put him on Symtuza.  I encouraged him to not miss a single dose.  He will follow-up after lab work in 6 weeks.
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
## 2018-10-15 NOTE — Assessment & Plan Note (Signed)
He plans to use his employee assistance program to get into a counseling program for his depression and drug use.

## 2018-10-19 ENCOUNTER — Ambulatory Visit: Payer: BLUE CROSS/BLUE SHIELD | Admitting: Family

## 2018-10-19 ENCOUNTER — Telehealth: Payer: Self-pay | Admitting: *Deleted

## 2018-10-19 NOTE — Telephone Encounter (Signed)
Patient left message asking for copay help, as he has a $30 copay for symtuza. RN attempted to set up copay card, but Linwood DibblesJanssen wanted to speak directly with patient to set up the account.  Patient will call today. Patient mentioned pill fatigue, being off medication for 1 year because he was "tired of taking Pills."  He is happy it is now 1 pill a day, but was ok with a delay in starting medication caused by the copay issue. He stated he was just "ready for a cure" so he didn't have to take a pill daily. WIll send info to Deirdre EvenerKim Epperson regarding injectable study. Andree CossHowell, Toddy Boyd M, RN

## 2018-10-22 NOTE — Telephone Encounter (Signed)
Thanks for helping him. 

## 2018-12-17 ENCOUNTER — Telehealth: Payer: Self-pay | Admitting: *Deleted

## 2018-12-17 ENCOUNTER — Ambulatory Visit: Payer: BLUE CROSS/BLUE SHIELD | Admitting: Internal Medicine

## 2018-12-17 NOTE — Telephone Encounter (Signed)
Left message asking patient to call his doctor's office to reschedule today's missed appointment.  Will also send mychart message. Andree Coss, RN

## 2019-01-01 ENCOUNTER — Telehealth: Payer: Self-pay | Admitting: *Deleted

## 2019-01-01 DIAGNOSIS — B20 Human immunodeficiency virus [HIV] disease: Secondary | ICD-10-CM

## 2019-01-01 NOTE — Telephone Encounter (Signed)
Unable to reconnect to patient regarding follow up.  He was hospitalized in December, needs close follow up per Dr Orvan Falconer.  Patient no-showed in January, did not reply to phone messages or mychart outreach attempts this time.  RN referred patient to [place] for [employer]. Marthann Schiller will connect with Raynelle Fanning at [facility] for her input.  Will also 'cc Lind Covert for her advice. Andree Coss, RN

## 2019-01-11 ENCOUNTER — Telehealth: Payer: Self-pay | Admitting: *Deleted

## 2019-01-11 NOTE — Telephone Encounter (Signed)
Referral received to engage with the patient for concerns related to missed and canceled visits. Contacted the patient today and we spoke about what precautions he should take during the COVID19 pandemic to reduce his risk. 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 Patient asked if he could be tested for COVID19. Explained to the patient that his medical provider has to provide a order after labeling him a high risk (traveled to high risk locations, been in close contact with a person diagnosed with COVID19 within the last 2 weeks or have a fever, cough and SOB). Patient stated he has a cough and objectively he sounds congested. Explain to the patient that treatment for COVID19 is to treat the symptoms unless severe and he should continue to follow the social isolation precautions and treat his symptoms(cough).    Rescheduled his missed appt with [name] for next Friday and arranged transportion for this visit. Educated the patient on the process for using Lyft services through [facility]. Explained to the patient my role at [facility] and asked him to please consider getting in contact with me if he is feeling sick, unable to take his medications, needs assistance with social resources or unable to reach the clinic for an appt/triage/rescheduling. Patient is saving my number at this time.

## 2019-01-11 NOTE — Telephone Encounter (Signed)
Thanks for helping Gabriel [name].

## 2019-01-18 ENCOUNTER — Ambulatory Visit (INDEPENDENT_AMBULATORY_CARE_PROVIDER_SITE_OTHER): Payer: BLUE CROSS/BLUE SHIELD | Admitting: Family

## 2019-01-18 ENCOUNTER — Other Ambulatory Visit: Payer: Self-pay

## 2019-01-18 ENCOUNTER — Encounter: Payer: Self-pay | Admitting: Family

## 2019-01-18 VITALS — BP 110/73 | HR 83 | Temp 98.5°F | Wt 191.0 lb

## 2019-01-18 DIAGNOSIS — B20 Human immunodeficiency virus [HIV] disease: Secondary | ICD-10-CM | POA: Diagnosis not present

## 2019-01-18 DIAGNOSIS — Z0000 Encounter for general adult medical examination without abnormal findings: Secondary | ICD-10-CM | POA: Diagnosis not present

## 2019-01-18 LAB — T-HELPER CELL (CD4) - (RCID CLINIC ONLY)
CD4 % Helper T Cell: 22 % — ABNORMAL LOW (ref 33–55)
CD4 T Cell Abs: 360 /uL — ABNORMAL LOW (ref 400–2700)

## 2019-01-18 NOTE — Patient Instructions (Addendum)
Nice to see you.   We will check your blood work today.  Please continue to take your Symtuza as prescribed.   Plan for follow up in 3 months or sooner if needed with lab work 1-2 weeks prior to appointment.  Please contact Dr. Gordy Savers office for further planning for your warts at:   [phone]

## 2019-01-18 NOTE — Progress Notes (Signed)
Subjective:    Patient ID: RICHARDSON DUBREE, male    DOB: 1976/04/05, 43 y.o.   MRN: [id number]  Chief Complaint  Patient presents with  . HIV Positive/AIDS     HPI:  RANFERI CLINGAN is a 43 y.o. male who presents today for routine follow up of HIV disease.   Mr. Strother was last seen in the office on 10/15/18 for post-hospitalization follow up and having been off his Descovy and Prezcobix for about 7 months. Blood work completed during hospitalization at [facility] with Viral load of 644 and CD4 count of 223. Recently missed his 2 month follow up appointment. During this 2 month time period medication was altered to [employer]. No new blood work is available for review. Healthcare maintenance due includes Menveo.  Mr. Innes has been taking his Symtuza as prescribed with is no adverse side effects and 1-2 missed doses since he was last seen. 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 He is worndering about FMLA or short term disability. Denies fevers, chills, night sweats, headaches, changes in vision, neck pain/stiffness, nausea, diarrhea, vomiting, lesions or rashes.  Mr. Frazer is covered through [employer] and has no problems obtaining his medications. 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 Denies recreational or illicit drug use recently. No feelings of being down, depressed or hopeless. Not currently sexually active and declines condoms.   Allergies  Allergen Reactions  . Abacavir     HLA B5701 positive - should not receive abacavir due to risk of hypersensitivity      Outpatient Medications Prior to Visit  Medication Sig Dispense Refill  . Darunavir-Cobicisctat-Emtricitabine-Tenofovir Alafenamide (SYMTUZA) 800-150-200-10 MG TABS Take 1 tablet by mouth daily with breakfast. 30 tablet 11  . ibuprofen (ADVIL,MOTRIN) 800 MG tablet Take 1 tablet (800 mg total) by mouth every 8 (eight) hours as needed  (pain). (Patient not taking: Reported on 10/15/2018) 60 tablet 0  . ondansetron (ZOFRAN ODT) 4 MG disintegrating tablet Take 1 tablet (4 mg total) by mouth every 8 (eight) hours as needed for nausea or vomiting. (Patient not taking: Reported on 10/15/2018) 20 tablet 0  . traMADol (ULTRAM) 50 MG tablet Take 1 tablet (50 mg total) by mouth every 6 (six) hours as needed. (Patient not taking: Reported on 10/15/2018) 15 tablet 0   No facility-administered medications prior to visit.      Past Medical History:  Diagnosis Date  . Abscess    buttocks  . Cellulitis   . HIV positive ([place])   . MRSA infection      Past Surgical History:  Procedure Laterality Date  . HAND / FINGER LESION EXCISION         Review of Systems  Constitutional: Negative for appetite change, chills, fatigue, fever and unexpected weight change.  Eyes: Negative for visual disturbance.  Respiratory: Negative for cough, chest tightness, shortness of breath and wheezing.   Cardiovascular: Negative for chest pain and leg swelling.  Gastrointestinal: Negative for abdominal pain, constipation, diarrhea, nausea and vomiting.  Genitourinary: Negative for dysuria, flank pain, frequency, genital sores, hematuria and urgency.  Skin: Negative for rash.  Allergic/Immunologic: Negative for immunocompromised state.  Neurological: Negative for dizziness and headaches.      Objective:    BP 110/73   Pulse 83   Temp 98.5 F (36.9 C) (Oral)   Wt 191 lb (86.6 kg)   BMI 23.25 kg/m  Nursing note  and vital signs reviewed.  Physical Exam Constitutional:      General: He is not in acute distress.    Appearance: He is well-developed.  Eyes:     Conjunctiva/sclera: Conjunctivae normal.  Neck:     Musculoskeletal: Neck supple.  Cardiovascular:     Rate and Rhythm: Normal rate and regular rhythm.     Heart sounds: Normal heart sounds. No murmur. No friction rub. No gallop.   Pulmonary:     Effort: Pulmonary effort is  normal. No respiratory distress.     Breath sounds: Normal breath sounds. No wheezing or rales.  Chest:     Chest wall: No tenderness.  Abdominal:     General: Bowel sounds are normal.     Palpations: Abdomen is soft.     Tenderness: There is no abdominal tenderness.  Lymphadenopathy:     Cervical: No cervical adenopathy.  Skin:    General: Skin is warm and dry.     Findings: No rash.  Neurological:     Mental Status: He is alert and oriented to person, place, and time.  Psychiatric:        Behavior: Behavior normal.        Thought Content: Thought content normal.        Judgment: Judgment normal.        Assessment & Plan:   Problem List Items Addressed This Visit      Other   Human immunodeficiency virus (HIV) disease ([place]) - Primary    Mr. Goffe appears to have improved adherence to his ART regimen of Symtuza. He has no signs/symptoms of opportunistic infection at present. We discussed that his immune system should remain strong if he continues to take his medications as prescribed. Will check blood work today. Continue current dose of Symtuza. Given the current situation will have him follow up in 3 months or sooner if needed with lab work 1-2 weeks prior to appointment.       Relevant Orders   T-helper cell (CD4)- ([facility] clinic only)   HIV-1 RNA quant-no reflex-bld   CBC   Comprehensive metabolic panel   CBC (Completed)   COMPLETE METABOLIC PANEL WITH GFR   T-helper cell (CD4)- ([facility] clinic only) (Completed)   HIV-1 RNA quant-no reflex-bld   Healthcare maintenance     Declines Menveo today.  Discussed importance of safe sexual practice to reduce risk of acquisition and transmission of STI. Declines condoms.           I have discontinued Lennette Bihari L. Recinos's ibuprofen, ondansetron, and traMADol. I am also having him maintain his Darunavir-Cobicisctat-Emtricitabine-Tenofovir Alafenamide.   Follow-up: Return in about 3 months (around 04/20/2019), or if symptoms  worsen or fail to improve.   Terri Piedra, MSN, FNP-C Nurse Practitioner [facility] for Infectious Disease [place] number: [phone]

## 2019-01-18 NOTE — Assessment & Plan Note (Signed)
   Declines Menveo today.  Discussed importance of safe sexual practice to reduce risk of acquisition and transmission of STI. Declines condoms.

## 2019-01-18 NOTE — Assessment & Plan Note (Signed)
Gabriel [name] appears to have improved adherence to his ART regimen of Symtuza. He has no signs/symptoms of opportunistic infection at present. We discussed that his immune system should remain strong if he continues to take his medications as prescribed. Will check blood work today. Continue current dose of Symtuza. Given the current situation will have him follow up in 3 months or sooner if needed with lab work 1-2 weeks prior to appointment.

## 2019-01-31 LAB — CBC
HEMATOCRIT: 44.6 % (ref 38.5–50.0)
Hemoglobin: 14.9 g/dL (ref 13.2–17.1)
MCH: 31.3 pg (ref 27.0–33.0)
MCHC: 33.4 g/dL (ref 32.0–36.0)
MCV: 93.7 fL (ref 80.0–100.0)
MPV: 11.4 fL (ref 7.5–12.5)
Platelets: 248 10*3/uL (ref 140–400)
RBC: 4.76 10*6/uL (ref 4.20–5.80)
RDW: 13.9 % (ref 11.0–15.0)
WBC: 3.9 10*3/uL (ref 3.8–10.8)

## 2019-01-31 LAB — COMPLETE METABOLIC PANEL WITH GFR
AG Ratio: 1.3 (calc) (ref 1.0–2.5)
ALT: 11 U/L (ref 9–46)
AST: 17 U/L (ref 10–40)
Albumin: 4.2 g/dL (ref 3.6–5.1)
Alkaline phosphatase (APISO): 76 U/L (ref 36–130)
BUN: 12 mg/dL (ref 7–25)
CO2: 27 mmol/L (ref 20–32)
Calcium: 9 mg/dL (ref 8.6–10.3)
Chloride: 110 mmol/L (ref 98–110)
Creat: 0.94 mg/dL (ref 0.60–1.35)
GFR, Est African American: 115 mL/min/{1.73_m2} (ref >=60)
GFR, Est Non African American: 100 mL/min/{1.73_m2} (ref >=60)
Globulin: 3.3 g/dL (calc) (ref 1.9–3.7)
Glucose, Bld: 83 mg/dL (ref 65–99)
Potassium: 3.9 mmol/L (ref 3.5–5.3)
Sodium: 146 mmol/L (ref 135–146)
Total Bilirubin: 0.5 mg/dL (ref 0.2–1.2)
Total Protein: 7.5 g/dL (ref 6.1–8.1)

## 2019-01-31 LAB — HIV-1 RNA QUANT-NO REFLEX-BLD
HIV 1 RNA Quant: 23 copies/mL — ABNORMAL HIGH
HIV-1 RNA QUANT, LOG: 1.36 {Log_copies}/mL — AB

## 2019-04-12 ENCOUNTER — Ambulatory Visit: Payer: BLUE CROSS/BLUE SHIELD | Admitting: Family

## 2019-04-29 ENCOUNTER — Ambulatory Visit: Payer: BC Managed Care – PPO | Admitting: Family

## 2019-04-30 ENCOUNTER — Other Ambulatory Visit: Payer: Self-pay

## 2019-04-30 ENCOUNTER — Ambulatory Visit (INDEPENDENT_AMBULATORY_CARE_PROVIDER_SITE_OTHER): Payer: BC Managed Care – PPO | Admitting: Family

## 2019-04-30 ENCOUNTER — Encounter: Payer: Self-pay | Admitting: Family

## 2019-04-30 VITALS — BP 108/77 | HR 91 | Temp 97.9°F

## 2019-04-30 DIAGNOSIS — R05 Cough: Secondary | ICD-10-CM

## 2019-04-30 DIAGNOSIS — B20 Human immunodeficiency virus [HIV] disease: Secondary | ICD-10-CM | POA: Diagnosis not present

## 2019-04-30 DIAGNOSIS — Z113 Encounter for screening for infections with a predominantly sexual mode of transmission: Secondary | ICD-10-CM | POA: Diagnosis not present

## 2019-04-30 DIAGNOSIS — R059 Cough, unspecified: Secondary | ICD-10-CM | POA: Insufficient documentation

## 2019-04-30 MED ORDER — DARUN-COBIC-EMTRICIT-TENOFAF 800-150-200-10 MG PO TABS: 1.0000 | ORAL_TABLET | Freq: Every day | ORAL | 5 refills | Status: DC

## 2019-04-30 NOTE — Assessment & Plan Note (Signed)
Gabriel [name] appears to have well controlled HIV disease with good adherence and tolerance to his ART regimen of Symtuza. He has no signs/symptoms of opportunistic infection or progressive HIV disease. No problems obtaining his medications. Will check blood work when feeling better. Continue current dose of Symtuza. Plan for follow up in 3 months or sooner if needed.

## 2019-04-30 NOTE — Progress Notes (Signed)
Subjective:    Patient ID: Gabriel [name], male    DOB: [birth year], 42 y.o.   MRN: [id number]  Chief Complaint  Patient presents with  . HIV Positive/AIDS  . Cough     HPI:  Gabriel [name] is a 43 y.o. male with HIV disease who was last seen in the office on 01/18/2019 with improved adherence to his ART regimen of Symtuza.  Blood work at the time showed a viral load that was undetectable and CD4 count of 360.  No recent blood work is been completed.  Healthcare maintenance due includes Menveo.  Gabriel [name] continues to take his Symtuza as prescribed no adverse side effects and occasional missed doses. Been having fatigue, cough and lack of appetitie have been going on for about 1 week. Does not think that he has had any fever and denies rigors/chills. He does not have any specific exposures but has not been wearing a mask recently. He has a co-worker whose family member died from COVID-27, but he is unsure if she has been tested or results if she has been. She was quarantined for 14 days and has not been in contact recently. Symptoms appear to be improving since onset but the cough and fatigue are remaining the same.    Gabriel [name] remains covered through [facility] and has no problems obtaining his medication from the pharmacy.  Denies recent feelings of being down, depressed, or hopeless. No recreational/illict drug use, tobacco use or alcohol consumption recently. Continues to work full time. Not currently sexually active.    Allergies  Allergen Reactions  . Abacavir     HLA B5701 positive - should not receive abacavir due to risk of hypersensitivity      Outpatient Medications Prior to Visit  Medication Sig Dispense Refill  . Darunavir-Cobicisctat-Emtricitabine-Tenofovir Alafenamide (SYMTUZA) 800-150-200-10 MG TABS Take 1 tablet by mouth daily with breakfast. 30 tablet 11   No facility-administered medications prior to visit.      Past Medical History:  Diagnosis  Date  . Abscess    buttocks  . Cellulitis   . HIV positive ([place])   . MRSA infection      Past Surgical History:  Procedure Laterality Date  . HAND / FINGER LESION EXCISION       Review of Systems  Constitutional: Positive for fatigue. Negative for appetite change, chills, fever and unexpected weight change.  Eyes: Negative for visual disturbance.  Respiratory: Positive for cough. Negative for chest tightness, shortness of breath and wheezing.   Cardiovascular: Negative for chest pain and leg swelling.  Gastrointestinal: Negative for abdominal pain, constipation, diarrhea, nausea and vomiting.  Genitourinary: Negative for dysuria, flank pain, frequency, genital sores, hematuria and urgency.  Skin: Negative for rash.  Allergic/Immunologic: Negative for immunocompromised state.  Neurological: Negative for dizziness and headaches.      Objective:    BP 108/77   Pulse 91   Temp 97.9 F (36.6 C)  Nursing note and vital signs reviewed.  Physical Exam Constitutional:      General: He is not in acute distress.    Appearance: He is well-developed.     Comments: Pleasant, seated in the chair, wearing a mask. Did not cough during interview.   Eyes:     Conjunctiva/sclera: Conjunctivae normal.  Neck:     Musculoskeletal: Neck supple.  Cardiovascular:     Rate and Rhythm: Normal rate and regular rhythm.     Heart sounds: Normal heart sounds. No murmur. No  friction rub. No gallop.   Pulmonary:     Effort: Pulmonary effort is normal. No respiratory distress.     Breath sounds: Normal breath sounds. No wheezing or rales.  Chest:     Chest wall: No tenderness.  Abdominal:     General: Bowel sounds are normal.     Palpations: Abdomen is soft.     Tenderness: There is no abdominal tenderness.  Lymphadenopathy:     Cervical: No cervical adenopathy.  Skin:    General: Skin is warm and dry.     Findings: No rash.  Neurological:     Mental Status: He is alert and oriented to  person, place, and time.  Psychiatric:        Behavior: Behavior normal.        Thought Content: Thought content normal.        Judgment: Judgment normal.        Assessment & Plan:   Problem List Items Addressed This Visit      Other   Human immunodeficiency virus (HIV) disease ([place]) - Primary    Gabriel [name] appears to have well controlled HIV disease with good adherence and tolerance to his ART regimen of Symtuza. He has no signs/symptoms of opportunistic infection or progressive HIV disease. No problems obtaining his medications. Will check blood work when feeling better. Continue current dose of Symtuza. Plan for follow up in 3 months or sooner if needed.      Relevant Medications   Darunavir-Cobicisctat-Emtricitabine-Tenofovir Alafenamide (SYMTUZA) 800-150-200-10 MG TABS   Other Relevant Orders   CBC   COMPLETE METABOLIC PANEL WITH GFR   HIV-1 RNA quant-no reflex-bld   T-helper cell (CD4)- ([facility] clinic only)   Hepatitis C antibody   Cough    Mr. Brame has symptoms of fatigue and cough that are consistent with a viral illness. COVID-19 remains a possibility, although after discussing his history he appears to be at low risk. Recommend continued symptom management with OTC medications as needed. He will quarantine for 14 days ending on 7/13. Advised to seek additional care if symptoms worsen or do not improve.        Other Visit Diagnoses    Screening for STDs (sexually transmitted diseases)       Relevant Orders   RPR       I am having Gabriel [name] maintain his Darunavir-Cobicisctat-Emtricitabine-Tenofovir Alafenamide.   Meds ordered this encounter  Medications  . Darunavir-Cobicisctat-Emtricitabine-Tenofovir Alafenamide (SYMTUZA) 800-150-200-10 MG TABS    Sig: Take 1 tablet by mouth daily with breakfast.    Dispense:  30 tablet    Refill:  5    Order Specific Question:   Supervising Provider    Answer:   Carlyle Basques [[id number]]     Follow-up: Return in about  3 months (around 07/31/2019), or if symptoms worsen or fail to improve.   Terri Piedra, MSN, FNP-C Nurse Practitioner [facility] for Infectious Disease [place] number: [phone]

## 2019-04-30 NOTE — Patient Instructions (Addendum)
Nice to see you.  We will check your blood work when you are feeling better.  If your symptoms worsen or shortness of breath develop, please seek emergency care.   You can be with others after:  3 days with no fever and Respiratory symptoms have improved (e.g. cough, shortness of breath) and 10 days since symptoms first appeared  Please continue to take your Symtuza as prescribed.  Plan for follow up in 3 months or sooner if needed.

## 2019-04-30 NOTE — Assessment & Plan Note (Signed)
Gabriel [name] has symptoms of fatigue and cough that are consistent with a viral illness. COVID-19 remains a possibility, although after discussing his history he appears to be at low risk. Recommend continued symptom management with OTC medications as needed. He will quarantine for 14 days ending on 7/13. Advised to seek additional care if symptoms worsen or do not improve.

## 2019-05-07 ENCOUNTER — Other Ambulatory Visit: Payer: Self-pay | Admitting: Family

## 2019-05-07 DIAGNOSIS — R05 Cough: Secondary | ICD-10-CM

## 2019-05-07 DIAGNOSIS — R059 Cough, unspecified: Secondary | ICD-10-CM

## 2019-05-09 ENCOUNTER — Other Ambulatory Visit: Payer: Self-pay | Admitting: Internal Medicine

## 2019-05-09 DIAGNOSIS — R6889 Other general symptoms and signs: Secondary | ICD-10-CM | POA: Diagnosis not present

## 2019-05-09 DIAGNOSIS — Z20822 Contact with and (suspected) exposure to covid-19: Secondary | ICD-10-CM

## 2019-05-14 LAB — NOVEL CORONAVIRUS, NAA: SARS-CoV-2, NAA: NOT DETECTED

## 2019-05-15 ENCOUNTER — Telehealth: Payer: Self-pay | Admitting: *Deleted

## 2019-05-15 NOTE — Telephone Encounter (Signed)
FMLA was faxed on 05/13/19.  Copy made for patient pickup (placed at front desk), copy placed to be scanned into chart.  Today, received release asking for office notes from 04/19/2019 to current, lab results, treatment plan, estimated return to work date to be faxed to [phone]. Patient needs new letter with return-to-work date of 05/20/2019. Will fax all together once letter is complete. Landis Gandy, RN

## 2019-05-16 ENCOUNTER — Encounter: Payer: Self-pay | Admitting: Family

## 2019-05-16 NOTE — Telephone Encounter (Signed)
Notified patient the letter is ready for pick up.

## 2019-05-16 NOTE — Telephone Encounter (Signed)
Letter written

## 2019-05-17 NOTE — Telephone Encounter (Signed)
Letter at front for pick up, refaxed information requested by short term disability claim per Rayhaan's request.

## 2019-05-22 ENCOUNTER — Encounter: Payer: Self-pay | Admitting: *Deleted

## 2019-08-01 ENCOUNTER — Ambulatory Visit: Payer: BC Managed Care – PPO | Admitting: Family

## 2019-08-12 ENCOUNTER — Telehealth: Payer: Self-pay | Admitting: *Deleted

## 2019-08-12 NOTE — Telephone Encounter (Signed)
Patient left message in triage asking for a call back. RN returned the call, left message asking him to call back nurse Sharyn Lull at his doctor's office. Landis Gandy, RN

## 2019-08-14 ENCOUNTER — Telehealth: Payer: Self-pay | Admitting: *Deleted

## 2019-08-14 NOTE — Telephone Encounter (Signed)
Patient called to make an office visit to have FMLA paperwork completed and he missed appt earlier in month. He also reports Cough, fever, and shortness of breath x 3 days. Advised him he needs to have a covid test prior to coming to a visit. Directed him to go to [facility] testing site and if he does not want to do that he could go to an urgent care or ED if he gest really short of breath that if tested positive he will not be able to come in office until he has a negative test. He advised he will go for testing today and understands not to come to office while sick.

## 2019-08-15 ENCOUNTER — Other Ambulatory Visit: Payer: Self-pay

## 2019-08-15 DIAGNOSIS — Z20822 Contact with and (suspected) exposure to covid-19: Secondary | ICD-10-CM

## 2019-08-17 LAB — NOVEL CORONAVIRUS, NAA: SARS-CoV-2, NAA: NOT DETECTED

## 2019-08-20 ENCOUNTER — Encounter: Payer: Self-pay | Admitting: Family

## 2019-08-20 ENCOUNTER — Ambulatory Visit (INDEPENDENT_AMBULATORY_CARE_PROVIDER_SITE_OTHER): Payer: BC Managed Care – PPO | Admitting: Family

## 2019-08-20 ENCOUNTER — Ambulatory Visit
Admission: RE | Admit: 2019-08-20 | Discharge: 2019-08-20 | Disposition: A | Discharge status: Home / Self Care | Payer: BC Managed Care – PPO | Source: Ambulatory Visit | Attending: Family | Admitting: Family

## 2019-08-20 ENCOUNTER — Other Ambulatory Visit: Payer: Self-pay

## 2019-08-20 VITALS — BP 121/83 | HR 87 | Temp 98.5°F | Wt 208.0 lb

## 2019-08-20 DIAGNOSIS — Z23 Encounter for immunization: Secondary | ICD-10-CM

## 2019-08-20 DIAGNOSIS — R05 Cough: Secondary | ICD-10-CM | POA: Diagnosis not present

## 2019-08-20 DIAGNOSIS — Z113 Encounter for screening for infections with a predominantly sexual mode of transmission: Secondary | ICD-10-CM | POA: Diagnosis not present

## 2019-08-20 DIAGNOSIS — Z79899 Other long term (current) drug therapy: Secondary | ICD-10-CM | POA: Diagnosis not present

## 2019-08-20 DIAGNOSIS — B20 Human immunodeficiency virus [HIV] disease: Secondary | ICD-10-CM

## 2019-08-20 DIAGNOSIS — A539 Syphilis, unspecified: Secondary | ICD-10-CM | POA: Diagnosis not present

## 2019-08-20 DIAGNOSIS — Z0000 Encounter for general adult medical examination without abnormal findings: Secondary | ICD-10-CM | POA: Diagnosis not present

## 2019-08-20 DIAGNOSIS — R059 Cough, unspecified: Secondary | ICD-10-CM

## 2019-08-20 DIAGNOSIS — F17200 Nicotine dependence, unspecified, uncomplicated: Secondary | ICD-10-CM

## 2019-08-20 NOTE — Assessment & Plan Note (Signed)
Continues to smoke approximately 1 pack/day on average.  Discussed importance of tobacco cessation to reduce risk of cardiovascular, malignant, and respiratory disease in the future.  He is in the precontemplation stage of quitting and not ready to quit at this time.

## 2019-08-20 NOTE — Assessment & Plan Note (Signed)
Gabriel [name] appears to be doing well with his current ART regimen of Symtuza.  He has no signs/symptoms of opportunistic infection or progressive HIV disease at present.  He has no problems obtaining his medication from the pharmacy.  Check blood work today.  Continue current dose of Symtuza.  Plan for follow-up in 3 to 4 months or sooner if needed with lab work on the same day.

## 2019-08-20 NOTE — Progress Notes (Signed)
Subjective:    Patient ID: Gabriel [name], male    DOB: 1976/09/06, 43 y.o.   MRN: [id number]  Chief Complaint  Patient presents with  . HIV Positive/AIDS  . Cough     HPI:  Gabriel [name] is a 43 y.o. male with HIV disease who was last seen in the office on 04/30/2019 with good adherence and tolerance to his ART regimen of Symtuza.  His most recent blood work prior to that time was a viral load being undetectable and CD4 count of 360 on 01/18/2019.  He is here today for routine follow-up.  Healthcare maintenance includes influenza vaccination and Menveo.  Gabriel [name] continues to take his Symtuza as prescribed no adverse side effects or missed doses since his last office visit.  He has been experiencing shortness of breath, cough, and having pain in his right temporomandibular joint described as pressure when eating and chewing.  He previously had fever and nausea which has improved over the last several days.  He has been taking over-the-counter NyQuil which has helped him sleep.  Cough is described as productive at times with green phlegm.  He does continue to smoke.  He was tested for coronavirus which was negative.  Gabriel [name] has no problems obtaining his medication from the pharmacy and remains covered through [facility].  He is not currently sexually active.  Continues to smoke approximately 1 pack of cigarettes per day.  Alcohol usage is on a rare occasion.  Denies any recreational or illicit drug use currently.  He has been out of work due to this illness recently.   Allergies  Allergen Reactions  . Abacavir     HLA [id number] positive - should not receive abacavir due to risk of hypersensitivity      Outpatient Medications Prior to Visit  Medication Sig Dispense Refill  . Darunavir-Cobicisctat-Emtricitabine-Tenofovir Alafenamide (SYMTUZA) 800-150-200-10 MG TABS Take 1 tablet by mouth daily with breakfast. 30 tablet 5   No facility-administered medications prior to  visit.      Past Medical History:  Diagnosis Date  . Abscess    buttocks  . Cellulitis   . HIV positive ([place])   . MRSA infection      Past Surgical History:  Procedure Laterality Date  . HAND / FINGER LESION EXCISION       Review of Systems  Constitutional: Negative for appetite change, chills, fatigue, fever and unexpected weight change.  HENT: Negative for congestion and ear pain.        Positive for jaw pain  Eyes: Negative for visual disturbance.  Respiratory: Positive for cough and shortness of breath. Negative for chest tightness and wheezing.   Cardiovascular: Negative for chest pain and leg swelling.  Gastrointestinal: Negative for abdominal pain, constipation, diarrhea, nausea and vomiting.  Genitourinary: Negative for dysuria, flank pain, frequency, genital sores, hematuria and urgency.  Skin: Negative for rash.  Allergic/Immunologic: Negative for immunocompromised state.  Neurological: Negative for dizziness and headaches.      Objective:    BP 121/83   Pulse 87   Temp 98.5 F (36.9 C)   Wt 208 lb (94.3 kg)   BMI 25.32 kg/m  Nursing note and vital signs reviewed.  Physical Exam Constitutional:      General: He is not in acute distress.    Appearance: He is well-developed.  Eyes:     Conjunctiva/sclera: Conjunctivae normal.  Neck:     Musculoskeletal: Neck supple.  Cardiovascular:     Rate  and Rhythm: Normal rate and regular rhythm.     Heart sounds: Normal heart sounds. No murmur. No friction rub. No gallop.   Pulmonary:     Effort: Pulmonary effort is normal. No respiratory distress.     Breath sounds: Normal breath sounds. No wheezing or rales.  Chest:     Chest wall: No tenderness.  Abdominal:     General: Bowel sounds are normal.     Palpations: Abdomen is soft.     Tenderness: There is no abdominal tenderness.  Lymphadenopathy:     Cervical: No cervical adenopathy.  Skin:    General: Skin is warm and dry.     Findings: No rash.   Neurological:     Mental Status: He is alert and oriented to person, place, and time.      Depression screen [facility] 2/9 08/20/2019 10/15/2018 11/23/2017 06/27/2017 04/08/2015  Decreased Interest 0 0 0 0 0  Down, Depressed, Hopeless 0 0 0 0 0  PHQ - 2 Score 0 0 0 0 0       Assessment & Plan:    Patient Active Problem List   Diagnosis Date Noted  . Cough 04/30/2019  . Healthcare maintenance 01/18/2019  . Genital warts 06/27/2017  . IVDU (intravenous drug user) 06/27/2017  . Depression 06/27/2017  . Hematochezia 03/26/2015  . Dermatitis 06/10/2014  . Dyslipidemia 04/14/2011  . Blood in stool 03/20/2009  . 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 DENTAL CARIES 01/02/2009  . TOBACCO USER 05/27/2008  . INSOMNIA, CHRONIC 05/27/2008  . Human immunodeficiency virus (HIV) disease ([place]) 11/28/2006  . SYPHILIS NOS 11/28/2006     Problem List Items Addressed This Visit      Other   Human immunodeficiency virus (HIV) disease ([place]) - Primary    Gabriel [name] appears to be doing well with his current ART regimen of Symtuza.  He has no signs/symptoms of opportunistic infection or progressive HIV disease at present.  He has no problems obtaining his medication from the pharmacy.  Check blood work today.  Continue current dose of Symtuza.  Plan for follow-up in 3 to 4 months or sooner if needed with lab work on the same day.      Relevant Orders   CBC (Completed)   COMPLETE METABOLIC PANEL WITH GFR   HIV-1 RNA quant-no reflex-bld   T-helper cell (CD4)- ([facility] clinic only)   TOBACCO USER    Continues to smoke approximately 1 pack/day on average.  Discussed importance of tobacco cessation to reduce risk of cardiovascular, malignant, and respiratory disease in the future.  He is in the precontemplation stage of quitting and not ready to quit at this time.      Healthcare maintenance     Influenza updated today.  Discussed importance of safe sexual practice to reduce risk of  acquisition/transmission of STI.  Declines condoms.      Cough    Gabriel [name] has a continued cough likely from a viral infection with SARS-CoV-2 negative.  Discussed likelihood of continuation of cough for the next couple weeks and gradually improving although given continued smoking is a concern.  Will check chest x-ray to rule out pneumonia although unlikely.  Continue symptom management and follow-up if symptoms worsen or do not improve.      Relevant Orders   DG Chest 2 View (Completed)    Other Visit Diagnoses    Screening for STDs (sexually transmitted diseases)       Relevant Orders  RPR   Need for immunization against influenza       Relevant Orders   Flu Vaccine QUAD 36+ mos IM (Completed)      I am having Gabriel [name] maintain his Darunavir-Cobicisctat-Emtricitabine-Tenofovir Alafenamide.   Follow-up: Return in about 3 months (around 11/20/2019), or if symptoms worsen or fail to improve.   Terri Piedra, MSN, FNP-C Nurse Practitioner [facility] [place] number: [phone]

## 2019-08-20 NOTE — Assessment & Plan Note (Signed)
   Influenza updated today.  Discussed importance of safe sexual practice to reduce risk of acquisition/transmission of STI.  Declines condoms. 

## 2019-08-20 NOTE — Patient Instructions (Addendum)
Nice to see you.  We will check your blood work today and obtain a chest x-ray.  Continue to treat your cough symptomatically at this time - likely to progressively improve over time.   Continue to take your Symtuza daily.   Plan for follow up in 3 months or sooner if needed.   Have a great day and stay safe!

## 2019-08-20 NOTE — Assessment & Plan Note (Signed)
Gabriel [name] has a continued cough likely from a viral infection with SARS-CoV-2 negative.  Discussed likelihood of continuation of cough for the next couple weeks and gradually improving although given continued smoking is a concern.  Will check chest x-ray to rule out pneumonia although unlikely.  Continue symptom management and follow-up if symptoms worsen or do not improve.

## 2019-08-21 LAB — T-HELPER CELL (CD4) - (RCID CLINIC ONLY)
CD4 % Helper T Cell: 21 % — ABNORMAL LOW (ref 33–65)
CD4 T Cell Abs: 353 /uL — ABNORMAL LOW (ref 400–1790)

## 2019-08-25 LAB — RPR TITER: RPR Titer: 1:2 {titer} — ABNORMAL HIGH

## 2019-08-25 LAB — CBC
HCT: 44 % (ref 38.5–50.0)
Hemoglobin: 14.7 g/dL (ref 13.2–17.1)
MCH: 32.5 pg (ref 27.0–33.0)
MCHC: 33.4 g/dL (ref 32.0–36.0)
MCV: 97.1 fL (ref 80.0–100.0)
MPV: 11.4 fL (ref 7.5–12.5)
Platelets: 211 10*3/uL (ref 140–400)
RBC: 4.53 10*6/uL (ref 4.20–5.80)
RDW: 12.6 % (ref 11.0–15.0)
WBC: 4.8 10*3/uL (ref 3.8–10.8)

## 2019-08-25 LAB — COMPLETE METABOLIC PANEL WITH GFR
AG Ratio: 1.4 (calc) (ref 1.0–2.5)
ALT: 18 U/L (ref 9–46)
AST: 15 U/L (ref 10–40)
Albumin: 4.5 g/dL (ref 3.6–5.1)
Alkaline phosphatase (APISO): 74 U/L (ref 36–130)
BUN: 21 mg/dL (ref 7–25)
CO2: 26 mmol/L (ref 20–32)
Calcium: 9.8 mg/dL (ref 8.6–10.3)
Chloride: 102 mmol/L (ref 98–110)
Creat: 0.93 mg/dL (ref 0.60–1.35)
GFR, Est African American: 116 mL/min/{1.73_m2} (ref >=60)
GFR, Est Non African American: 100 mL/min/{1.73_m2} (ref >=60)
Globulin: 3.2 g/dL (calc) (ref 1.9–3.7)
Glucose, Bld: 92 mg/dL (ref 65–99)
Potassium: 4.4 mmol/L (ref 3.5–5.3)
Sodium: 137 mmol/L (ref 135–146)
Total Bilirubin: 0.3 mg/dL (ref 0.2–1.2)
Total Protein: 7.7 g/dL (ref 6.1–8.1)

## 2019-08-25 LAB — FLUORESCENT TREPONEMAL AB(FTA)-IGG-BLD: Fluorescent Treponemal ABS: REACTIVE — AB

## 2019-08-25 LAB — RPR: RPR Ser Ql: REACTIVE — AB

## 2019-08-25 LAB — HIV-1 RNA QUANT-NO REFLEX-BLD
HIV 1 RNA Quant: 156 copies/mL — ABNORMAL HIGH
HIV-1 RNA Quant, Log: 2.19 Log copies/mL — ABNORMAL HIGH

## 2019-08-29 ENCOUNTER — Encounter: Payer: Self-pay | Admitting: Family

## 2019-09-09 ENCOUNTER — Emergency Department
Admission: EM | Admit: 2019-09-09 | Discharge: 2019-09-09 | Disposition: A | Discharge status: Home / Self Care | Payer: BC Managed Care – PPO | Attending: Student in an Organized Health Care Education/Training Program | Admitting: Student in an Organized Health Care Education/Training Program

## 2019-09-09 ENCOUNTER — Other Ambulatory Visit: Payer: Self-pay

## 2019-09-09 ENCOUNTER — Encounter: Payer: Self-pay | Admitting: Emergency Medicine

## 2019-09-09 DIAGNOSIS — F1910 Other psychoactive substance abuse, uncomplicated: Secondary | ICD-10-CM | POA: Diagnosis not present

## 2019-09-09 DIAGNOSIS — Z79899 Other long term (current) drug therapy: Secondary | ICD-10-CM | POA: Diagnosis not present

## 2019-09-09 DIAGNOSIS — F17210 Nicotine dependence, cigarettes, uncomplicated: Secondary | ICD-10-CM | POA: Insufficient documentation

## 2019-09-09 DIAGNOSIS — R109 Unspecified abdominal pain: Secondary | ICD-10-CM | POA: Diagnosis not present

## 2019-09-09 DIAGNOSIS — F1410 Cocaine abuse, uncomplicated: Secondary | ICD-10-CM | POA: Insufficient documentation

## 2019-09-09 DIAGNOSIS — B20 Human immunodeficiency virus [HIV] disease: Secondary | ICD-10-CM | POA: Insufficient documentation

## 2019-09-09 DIAGNOSIS — R197 Diarrhea, unspecified: Secondary | ICD-10-CM | POA: Diagnosis not present

## 2019-09-09 LAB — CBC
HCT: 43.8 % (ref 39.0–52.0)
Hemoglobin: 14.9 g/dL (ref 13.0–17.0)
MCH: 32.5 pg (ref 26.0–34.0)
MCHC: 34 g/dL (ref 30.0–36.0)
MCV: 95.6 fL (ref 80.0–100.0)
Platelets: 183 10*3/uL (ref 150–400)
RBC: 4.58 MIL/uL (ref 4.22–5.81)
RDW: 13 % (ref 11.5–15.5)
WBC: 5.7 10*3/uL (ref 4.0–10.5)
nRBC: 0 % (ref 0.0–0.2)

## 2019-09-09 LAB — COMPREHENSIVE METABOLIC PANEL
ALT: 15 U/L (ref 0–44)
AST: 18 U/L (ref 15–41)
Albumin: 4.3 g/dL (ref 3.5–5.0)
Alkaline Phosphatase: 70 U/L (ref 38–126)
Anion gap: 13 (ref 5–15)
BUN: 14 mg/dL (ref 6–20)
CO2: 23 mmol/L (ref 22–32)
Calcium: 9.2 mg/dL (ref 8.9–10.3)
Chloride: 102 mmol/L (ref 98–111)
Creatinine, Ser: 1.05 mg/dL (ref 0.61–1.24)
GFR calc Af Amer: >60 mL/min (ref >60)
GFR calc non Af Amer: >60 mL/min (ref >60)
Glucose, Bld: 102 mg/dL — ABNORMAL HIGH (ref 70–99)
Potassium: 3.8 mmol/L (ref 3.5–5.1)
Sodium: 138 mmol/L (ref 135–145)
Total Bilirubin: 0.7 mg/dL (ref 0.3–1.2)
Total Protein: 7.9 g/dL (ref 6.5–8.1)

## 2019-09-09 LAB — GASTROINTESTINAL PANEL BY PCR, STOOL (REPLACES STOOL CULTURE)

## 2019-09-09 LAB — URINALYSIS, COMPLETE (UACMP) WITH MICROSCOPIC
Bacteria, UA: NONE SEEN
Bilirubin Urine: NEGATIVE
Glucose, UA: NEGATIVE mg/dL
Hgb urine dipstick: NEGATIVE
Ketones, ur: NEGATIVE mg/dL
Nitrite: NEGATIVE
Protein, ur: NEGATIVE mg/dL
Specific Gravity, Urine: 1.013 (ref 1.005–1.030)
pH: 6 (ref 5.0–8.0)

## 2019-09-09 LAB — C DIFFICILE QUICK SCREEN W PCR REFLEX
C Diff antigen: NEGATIVE
C Diff interpretation: NOT DETECTED
C Diff toxin: NEGATIVE

## 2019-09-09 LAB — LIPASE, BLOOD: Lipase: 40 U/L (ref 11–51)

## 2019-09-09 NOTE — ED Triage Notes (Signed)
Pt reports abd pain and diarrhea for the last 8 days.

## 2019-09-09 NOTE — Discharge Instructions (Signed)
We will call you  your stool studies test positive for a bacterial or viral cause. Please follow-up with PCP

## 2019-09-09 NOTE — ED Provider Notes (Signed)
[facility] Emergency Department Provider Note    First MD Initiated Contact with Patient 09/09/19 1558     (approximate)  I have reviewed the triage vital signs and the nursing notes.   HISTORY  Chief Complaint Abdominal Cramping and Diarrhea    HPI JABARIE POP is a 43 y.o. male below listed past medical history compliant with his HIV medication presents the ER for 8 days of watery diarrhea.  States he is having 4-5 episodes a day typically anytime he eats.  Does not feel dehydrated.  Has tested positive for Shigella in the past.  States he feels like it is a little bit less severe than that.  He is denying any abdominal pain at this time.  Denies any bloody diarrhea.  States he came to the ER simply to be evaluated because have been going on so long.  Has not taken any sort of Imodium or conservative management.  Denies any recent travel.  No camping.  No other sick contacts.  Was recently tested and was negative for Covid.    Past Medical History:  Diagnosis Date  . Abscess    buttocks  . Cellulitis   . HIV positive ([place])   . MRSA infection    Family History  Problem Relation Age of Onset  . Hypertension Mother   . Cancer Maternal Grandmother   . Crohn's disease Brother    Past Surgical History:  Procedure Laterality Date  . HAND / FINGER LESION EXCISION     Patient Active Problem List   Diagnosis Date Noted  . Cough 04/30/2019  . Healthcare maintenance 01/18/2019  . Genital warts 06/27/2017  . IVDU (intravenous drug user) 06/27/2017  . Depression 06/27/2017  . Hematochezia 03/26/2015  . Dermatitis 06/10/2014  . Dyslipidemia 04/14/2011  . Blood in stool 03/20/2009  . 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 DENTAL CARIES 01/02/2009  . TOBACCO USER 05/27/2008  . INSOMNIA, CHRONIC 05/27/2008  . Human immunodeficiency virus (HIV) disease ([place]) 11/28/2006  . SYPHILIS NOS 11/28/2006      Prior to Admission  medications   Medication Sig Start Date End Date Taking? Authorizing Provider  Darunavir-Cobicisctat-Emtricitabine-Tenofovir Alafenamide [facility]) 800-150-200-10 MG TABS Take 1 tablet by mouth daily with breakfast. 04/30/19   Golden Circle, FNP    Allergies Abacavir    Social History Social History   Tobacco Use  . Smoking status: Current Every Day Smoker    Packs/day: 1.00    Types: Cigarettes  . Smokeless tobacco: Never Used  Substance Use Topics  . Alcohol use: Yes    Alcohol/week: 3.0 standard drinks    Types: 3 Standard drinks or equivalent per week    Comment: socially   . Drug use: No    Types: Cocaine, IV, Methamphetamines    Comment: only meth at this point    Review of Systems Patient denies headaches, rhinorrhea, blurry vision, numbness, shortness of breath, chest pain, edema, cough, abdominal pain, nausea, vomiting, diarrhea, dysuria, fevers, rashes or hallucinations unless otherwise stated above in HPI. ____________________________________________   PHYSICAL EXAM:  VITAL SIGNS: Vitals:   09/09/19 1441  BP: 131/82  Pulse: 85  Resp: 20  Temp: 98.7 F (37.1 C)  SpO2: 98%    Constitutional: Alert and oriented.  Eyes: Conjunctivae are normal.  Head: Atraumatic. Nose: No congestion/rhinnorhea. Mouth/Throat: Mucous membranes are moist.   Neck: No stridor. Painless ROM.  Cardiovascular: Normal rate, regular rhythm. Grossly normal heart sounds.  Good peripheral circulation.  Respiratory: Normal respiratory effort.  No retractions. Lungs CTAB. Gastrointestinal: Soft and nontender. No distention. No abdominal bruits. No CVA tenderness. Genitourinary:  Musculoskeletal: No lower extremity tenderness nor edema.  No joint effusions. Neurologic:  Normal speech and language. No gross focal neurologic deficits are appreciated. No facial droop Skin:  Skin is warm, dry and intact. No rash noted. Psychiatric: Mood and affect are normal. Speech and behavior are  normal.  ____________________________________________   LABS (all labs ordered are listed, but only abnormal results are displayed)  Results for orders placed or performed during the hospital encounter of 09/09/19 (from the past 24 hour(s))  Lipase, blood     Status: None   Collection Time: 09/09/19  2:42 PM  Result Value Ref Range   Lipase 40 11 - 51 U/L  Comprehensive metabolic panel     Status: Abnormal   Collection Time: 09/09/19  2:42 PM  Result Value Ref Range   Sodium 138 135 - 145 mmol/L   Potassium 3.8 3.5 - 5.1 mmol/L   Chloride 102 98 - 111 mmol/L   CO2 23 22 - 32 mmol/L   Glucose, Bld 102 (H) 70 - 99 mg/dL   BUN 14 6 - 20 mg/dL   Creatinine, Ser 8.18 0.61 - 1.24 mg/dL   Calcium 9.2 8.9 - 29.9 mg/dL   Total Protein 7.9 6.5 - 8.1 g/dL   Albumin 4.3 3.5 - 5.0 g/dL   AST 18 15 - 41 U/L   ALT 15 0 - 44 U/L   Alkaline Phosphatase 70 38 - 126 U/L   Total Bilirubin 0.7 0.3 - 1.2 mg/dL   GFR calc non Af Amer >60 >60 mL/min   GFR calc Af Amer >60 >60 mL/min   Anion gap 13 5 - 15  CBC     Status: None   Collection Time: 09/09/19  2:42 PM  Result Value Ref Range   WBC 5.7 4.0 - 10.5 K/uL   RBC 4.58 4.22 - 5.81 MIL/uL   Hemoglobin 14.9 13.0 - 17.0 g/dL   HCT 37.1 69.6 - 78.9 %   MCV 95.6 80.0 - 100.0 fL   MCH 32.5 26.0 - 34.0 pg   MCHC 34.0 30.0 - 36.0 g/dL   RDW 38.1 01.7 - 51.0 %   Platelets 183 150 - 400 K/uL   nRBC 0.0 0.0 - 0.2 %  Urinalysis, Complete w Microscopic     Status: Abnormal   Collection Time: 09/09/19  2:42 PM  Result Value Ref Range   Color, Urine YELLOW (A) YELLOW   APPearance CLEAR (A) CLEAR   Specific Gravity, Urine 1.013 1.005 - 1.030   pH 6.0 5.0 - 8.0   Glucose, UA NEGATIVE NEGATIVE mg/dL   Hgb urine dipstick NEGATIVE NEGATIVE   Bilirubin Urine NEGATIVE NEGATIVE   Ketones, ur NEGATIVE NEGATIVE mg/dL   Protein, ur NEGATIVE NEGATIVE mg/dL   Nitrite NEGATIVE NEGATIVE   Leukocytes,Ua TRACE (A) NEGATIVE   RBC / HPF 0-5 0 - 5 RBC/hpf   WBC,  UA 0-5 0 - 5 WBC/hpf   Bacteria, UA NONE SEEN NONE SEEN   Squamous Epithelial / LPF 0-5 0 - 5   Mucus PRESENT    ____________________________________________ _______________________________  RADIOLOGY   ____________________________________________   PROCEDURES  Procedure(s) performed:  Procedures    Critical Care performed: no ____________________________________________   INITIAL IMPRESSION / ASSESSMENT AND PLAN / ED COURSE  Pertinent labs & imaging results that were available during my care of the patient were reviewed  by me and considered in my medical decision making (see chart for details).   DDX: Enteritis, colitis, diverticulitis, viral illness, food allergy, IBD  Sullivan LoneKevin L Neidert is a 43 y.o. who presents to the ED with symptoms as described above.  Patient well-appearing afebrile hemodynamically stable.  Blood work sent for the above differential was reassuring.  Does not meet any criteria for sepsis.  His abdominal exam is soft and benign.  Do not feel that CT imaging clinically indicated and the patient is compliant with his HIV medication.  Patient agrees to stool studies but does not want to wait for the results.  States he is tolerating oral hydration and is stating he needs to be discharged by 5:00.   Clinical Course as of Sep 08 1638  Mon Sep 09, 2019  1638 Patient was able to provide a stool study.  Requesting discharge home.  Remains hemodynamically stable.  I believe that trial of outpatient management is appropriate.   [PR]    Clinical Course User Index [PR] Willy Eddyobinson, Kennisha Qin, MD    The patient was evaluated in Emergency Department today for the symptoms described in the history of present illness. He/she was evaluated in the context of the global COVID-19 pandemic, which necessitated consideration that the patient might be at risk for infection with the SARS-CoV-2 virus that causes COVID-19. Institutional protocols and algorithms that pertain to the  evaluation of patients at risk for COVID-19 are in a state of rapid change based on information released by regulatory bodies including the CDC and federal and state organizations. These policies and algorithms were followed during the patient's care in the ED.  As part of my medical decision making, I reviewed the following data within the electronic MEDICAL RECORD NUMBER Nursing notes reviewed and incorporated, Labs reviewed, notes from prior ED visits and [place] Controlled Substance Database   ____________________________________________   FINAL CLINICAL IMPRESSION(S) / ED DIAGNOSES  Final diagnoses:  Diarrhea, unspecified type      NEW MEDICATIONS STARTED DURING THIS VISIT:  New Prescriptions   No medications on file     Note:  This document was prepared using Dragon voice recognition software and may include unintentional dictation errors.    Willy Eddyobinson, Renae Mottley, MD 09/09/19 [phone]

## 2019-09-11 ENCOUNTER — Telehealth: Payer: Self-pay | Admitting: Emergency Medicine

## 2019-09-11 MED ORDER — TINIDAZOLE 500 MG PO TABS: 2.0000 g | ORAL_TABLET | Freq: Once | ORAL | 0 refills | Status: AC

## 2019-09-11 MED ORDER — TINIDAZOLE 500 MG PO TABS: 2.0000 g | ORAL_TABLET | Freq: Once | ORAL | 0 refills | Status: DC

## 2019-09-11 NOTE — Telephone Encounter (Signed)
Pt requesting rx be called in to [facility] on [address]. Medication reviewed wit pt over the phone. Pt verbalized understanding.

## 2019-10-01 MED ORDER — NICOTINE 21 MG/24HR TD PT24: 21.0000 mg | MEDICATED_PATCH | Freq: Every day | TRANSDERMAL | 1 refills | Status: DC

## 2019-11-12 ENCOUNTER — Ambulatory Visit: Payer: BC Managed Care – PPO | Attending: Internal Medicine

## 2019-11-12 DIAGNOSIS — Z20822 Contact with and (suspected) exposure to covid-19: Secondary | ICD-10-CM | POA: Diagnosis not present

## 2019-11-14 LAB — NOVEL CORONAVIRUS, NAA: SARS-CoV-2, NAA: NOT DETECTED

## 2019-11-19 ENCOUNTER — Ambulatory Visit: Payer: BC Managed Care – PPO | Attending: Internal Medicine

## 2019-11-19 DIAGNOSIS — Z20822 Contact with and (suspected) exposure to covid-19: Secondary | ICD-10-CM

## 2019-11-20 LAB — NOVEL CORONAVIRUS, NAA: SARS-CoV-2, NAA: NOT DETECTED

## 2019-11-22 ENCOUNTER — Ambulatory Visit: Payer: BC Managed Care – PPO | Attending: Internal Medicine

## 2019-11-22 DIAGNOSIS — Z20822 Contact with and (suspected) exposure to covid-19: Secondary | ICD-10-CM | POA: Diagnosis not present

## 2019-11-23 LAB — NOVEL CORONAVIRUS, NAA: SARS-CoV-2, NAA: NOT DETECTED

## 2019-11-27 ENCOUNTER — Emergency Department: Payer: BC Managed Care – PPO

## 2019-11-27 ENCOUNTER — Encounter: Payer: Self-pay | Admitting: Emergency Medicine

## 2019-11-27 ENCOUNTER — Other Ambulatory Visit: Payer: Self-pay

## 2019-11-27 ENCOUNTER — Emergency Department
Admission: EM | Admit: 2019-11-27 | Discharge: 2019-11-27 | Disposition: A | Discharge status: Home / Self Care | Payer: BC Managed Care – PPO | Attending: Emergency Medicine | Admitting: Emergency Medicine

## 2019-11-27 DIAGNOSIS — Z79899 Other long term (current) drug therapy: Secondary | ICD-10-CM | POA: Diagnosis not present

## 2019-11-27 DIAGNOSIS — R05 Cough: Secondary | ICD-10-CM | POA: Diagnosis not present

## 2019-11-27 DIAGNOSIS — H6992 Unspecified Eustachian tube disorder, left ear: Secondary | ICD-10-CM | POA: Diagnosis not present

## 2019-11-27 DIAGNOSIS — F17210 Nicotine dependence, cigarettes, uncomplicated: Secondary | ICD-10-CM | POA: Diagnosis not present

## 2019-11-27 DIAGNOSIS — Z21 Asymptomatic human immunodeficiency virus [HIV] infection status: Secondary | ICD-10-CM | POA: Diagnosis not present

## 2019-11-27 DIAGNOSIS — H6982 Other specified disorders of Eustachian tube, left ear: Secondary | ICD-10-CM | POA: Insufficient documentation

## 2019-11-27 DIAGNOSIS — J0100 Acute maxillary sinusitis, unspecified: Secondary | ICD-10-CM | POA: Insufficient documentation

## 2019-11-27 DIAGNOSIS — J320 Chronic maxillary sinusitis: Secondary | ICD-10-CM | POA: Diagnosis not present

## 2019-11-27 DIAGNOSIS — H9202 Otalgia, left ear: Secondary | ICD-10-CM | POA: Diagnosis not present

## 2019-11-27 MED ORDER — FEXOFENADINE-PSEUDOEPHED ER 60-120 MG PO TB12: 1.0000 | ORAL_TABLET | Freq: Two times a day (BID) | ORAL | 0 refills | Status: AC

## 2019-11-27 MED ORDER — AMOXICILLIN 875 MG PO TABS: 875.0000 mg | ORAL_TABLET | Freq: Two times a day (BID) | ORAL | 0 refills | Status: DC

## 2019-11-27 NOTE — ED Notes (Signed)
Provider at bedside to assess patient. Will continue to monitor

## 2019-11-27 NOTE — ED Triage Notes (Addendum)
Patient ambulatory to triage with steady gait, without difficulty or distress noted, mask in place; pt reports returned from [place] on Wednesday and noted a cough which has now turned productive with green sputum accomp by left ear pain, muffled hearing; pt st recent COVID test neg

## 2019-11-27 NOTE — ED Provider Notes (Signed)
[facility] Emergency Department Provider Note   ____________________________________________   First MD Initiated Contact with Patient 11/27/19 [phone]     (approximate)  I have reviewed the triage vital signs and the nursing notes.   HISTORY  Chief Complaint Cough    HPI Gabriel [name] is a 44 y.o. male patient complain of left ear pain, decreased hearing left ear, nasal congestion, and nonproductive cough.  Patient is a week and a half ago he took a trip to [facility].  Patient had a Covid test before and after his trip.  Patient state he was diagnosed with follow-up respiratory infection and placed on Tussionex.  Patient states cough is getting better but the nasal congestion and ear pain has increased.  Patient denies nausea, vomiting, diarrhea.  Patient unsure of fever.  Patient reports is concerned due to his decreased immune system secondary to HIV.  Patient rates his pain as a 9/10.  Patient rates his pain as "achy/pressure".         Past Medical History:  Diagnosis Date  . Abscess    buttocks  . Cellulitis   . HIV positive (HCC)   . MRSA infection     Patient Active Problem List   Diagnosis Date Noted  . Cough 04/30/2019  . Healthcare maintenance 01/18/2019  . Genital warts 06/27/2017  . IVDU (intravenous drug user) 06/27/2017  . Depression 06/27/2017  . Hematochezia 03/26/2015  . Dermatitis 06/10/2014  . Dyslipidemia 04/14/2011  . Blood in stool 03/20/2009  . 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 DENTAL CARIES 01/02/2009  . TOBACCO USER 05/27/2008  . INSOMNIA, CHRONIC 05/27/2008  . Human immunodeficiency virus (HIV) disease (HCC) 11/28/2006  . SYPHILIS NOS 11/28/2006    Past Surgical History:  Procedure Laterality Date  . HAND / FINGER LESION EXCISION      Prior to Admission medications   Medication Sig Start Date End Date Taking? Authorizing Provider  amoxicillin (AMOXIL) 875 MG tablet  Take 1 tablet (875 mg total) by mouth 2 (two) times daily. 11/27/19   Joni Reining, PA-C  Darunavir-Cobicisctat-Emtricitabine-Tenofovir Alafenamide [facility]) 800-150-200-10 MG TABS Take 1 tablet by mouth daily with breakfast. 04/30/19   Veryl Speak, FNP  fexofenadine-pseudoephedrine (ALLEGRA-D) 60-120 MG 12 hr tablet Take 1 tablet by mouth 2 (two) times daily. 11/27/19   Joni Reining, PA-C  nicotine (NICODERM CQ) 21 mg/24hr patch Place 1 patch (21 mg total) onto the skin daily. 10/01/19   Veryl Speak, FNP    Allergies Abacavir  Family History  Problem Relation Age of Onset  . Hypertension Mother   . Cancer Maternal Grandmother   . Crohn's disease Brother     Social History Social History   Tobacco Use  . Smoking status: Current Every Day Smoker    Packs/day: 1.00    Types: Cigarettes  . Smokeless tobacco: Never Used  Substance Use Topics  . Alcohol use: Yes    Alcohol/week: 3.0 standard drinks    Types: 3 Standard drinks or equivalent per week    Comment: socially   . Drug use: No    Types: Cocaine, IV, Methamphetamines    Comment: only meth at this point    Review of Systems Constitutional: No fever/chills Eyes: No visual changes. ENT: No sore throat.  Left ear pain and decreased hearing.  Nasal congestion and facial pain. Cardiovascular: Denies chest pain. Respiratory: Denies shortness of breath. Gastrointestinal: No abdominal pain.  No nausea, no vomiting.  No diarrhea.  No constipation. Genitourinary: Negative for dysuria. Musculoskeletal: Negative for back pain. Skin: Negative for rash. Neurological: Negative for headaches, focal weakness or numbness. Endocrine:  Hyperlipidemia Hematological/Lymphatic:  HIV Allergic/Immunilogical: Abacavir  ____________________________________________   PHYSICAL EXAM:  VITAL SIGNS: ED Triage Vitals  Enc Vitals Group     BP 11/27/19 0549 112/80     Pulse Rate 11/27/19 0549 90     Resp 11/27/19 0549 18      Temp 11/27/19 0549 98.2 F (36.8 C)     Temp Source 11/27/19 0549 Oral     SpO2 11/27/19 0549 98 %     Weight 11/27/19 0547 217 lb (98.4 kg)     Height 11/27/19 0547 6\' 6"  (1.981 m)     Head Circumference --      Peak Flow --      Pain Score 11/27/19 0547 9     Pain Loc --      Pain Edu? --      Excl. in [place]? --    Constitutional: Alert and oriented. Well appearing and in no acute distress. Eyes: Conjunctivae are normal. PERRL. EOMI. Head: Atraumatic. Nose: Edematous nasal turbinates bilateral maxillary guarding. EARS: Bilateral bulging TMs. Mouth/Throat: Mucous membranes are moist.  Oropharynx non-erythematous.  Postnasal drainage. Neck: No stridor.   Hematological/Lymphatic/Immunilogical: No cervical lymphadenopathy. Cardiovascular: Normal rate, regular rhythm. Grossly normal heart sounds.  Good peripheral circulation. Respiratory: Normal respiratory effort.  No retractions. Lungs CTAB. Neurologic:  Normal speech and language. No gross focal neurologic deficits are appreciated. No gait instability. Skin:  Skin is warm, dry and intact. No rash noted. Psychiatric: Mood and affect are normal. Speech and behavior are normal.  ____________________________________________   LABS (all labs ordered are listed, but only abnormal results are displayed)  Labs Reviewed - No data to display ____________________________________________  EKG   ____________________________________________  RADIOLOGY  ED MD interpretation:    Official radiology report(s): DG Chest 2 View  Result Date: 11/27/2019 CLINICAL DATA:  Productive cough. EXAM: CHEST - 2 VIEW COMPARISON:  08/20/2019 FINDINGS: The heart size and mediastinal contours are within normal limits. Both lungs are clear. The visualized skeletal structures are unremarkable. IMPRESSION: No acute cardiopulmonary findings. Electronically Signed   By: Marijo Sanes M.D.   On: 11/27/2019 06:13     ____________________________________________   PROCEDURES  Procedure(s) performed (including Critical Care):  Procedures   ____________________________________________   INITIAL IMPRESSION / ASSESSMENT AND PLAN / ED COURSE  As part of my medical decision making, I reviewed the following data within the [place]     Patient presents with ear pain and decreased hearing.  Patient also has nasal congestion and facial pain.  Patient physical exam consistent with eustachian tube dysfunction and subacute maxillary sinusitis.  Discussed negative chest x-ray findings with patient.  Patient given discharge care instruction advised take medication as directed.  Patient advised follow-up PCP if no improvement in 3 to 5 days.    Gabriel [name] was evaluated in Emergency Department on 11/27/2019 for the symptoms described in the history of present illness. He was evaluated in the context of the global COVID-19 pandemic, which necessitated consideration that the patient might be at risk for infection with the SARS-CoV-2 virus that causes COVID-19. Institutional protocols and algorithms that pertain to the evaluation of patients at risk for COVID-19 are in a state of rapid change based on information released by regulatory bodies including the CDC and federal and state organizations. These policies and  algorithms were followed during the patient's care in the ED.       ____________________________________________   FINAL CLINICAL IMPRESSION(S) / ED DIAGNOSES  Final diagnoses:  Eustachian tube dysfunction, left  Subacute maxillary sinusitis     ED Discharge Orders         Ordered    amoxicillin (AMOXIL) 875 MG tablet  2 times daily     11/27/19 0719    fexofenadine-pseudoephedrine (ALLEGRA-D) 60-120 MG 12 hr tablet  2 times daily     11/27/19 0719           Note:  This document was prepared using Dragon voice recognition software and may include unintentional  dictation errors.    Joni Reining, PA-C 11/27/19 4166    Chesley Noon, MD 11/28/19 1154

## 2019-11-27 NOTE — Discharge Instructions (Signed)
Follow discharge care instruction take medication as directed. °

## 2019-12-13 DIAGNOSIS — H6122 Impacted cerumen, left ear: Secondary | ICD-10-CM | POA: Diagnosis not present

## 2019-12-13 DIAGNOSIS — H6502 Acute serous otitis media, left ear: Secondary | ICD-10-CM | POA: Diagnosis not present

## 2019-12-19 ENCOUNTER — Telehealth: Payer: Self-pay

## 2019-12-19 NOTE — Telephone Encounter (Signed)
COVID-19 Pre-Screening Questions:12/19/19  Do you currently have a fever (>100 F), chills or unexplained body aches? *NO  Are you currently experiencing new cough, shortness of breath, sore throat, runny nose? NO .  Have you recently travelled outside the state of [place] in the last 14 days?NO .  Have you been in contact with someone that is currently pending confirmation of Covid19 testing or has been confirmed to have the Covid19 virus? NO  **If the patient answers NO to ALL questions -  advise the patient to please call the clinic before coming to the office should any symptoms develop.

## 2019-12-20 ENCOUNTER — Encounter: Payer: Self-pay | Admitting: Family

## 2019-12-20 ENCOUNTER — Other Ambulatory Visit: Payer: Self-pay

## 2019-12-20 ENCOUNTER — Ambulatory Visit (INDEPENDENT_AMBULATORY_CARE_PROVIDER_SITE_OTHER): Payer: BC Managed Care – PPO | Admitting: Family

## 2019-12-20 VITALS — BP 125/86 | HR 96 | Temp 98.4°F | Wt 220.0 lb

## 2019-12-20 DIAGNOSIS — M79605 Pain in left leg: Secondary | ICD-10-CM

## 2019-12-20 DIAGNOSIS — Z113 Encounter for screening for infections with a predominantly sexual mode of transmission: Secondary | ICD-10-CM | POA: Diagnosis not present

## 2019-12-20 DIAGNOSIS — B20 Human immunodeficiency virus [HIV] disease: Secondary | ICD-10-CM

## 2019-12-20 DIAGNOSIS — H6980 Other specified disorders of Eustachian tube, unspecified ear: Secondary | ICD-10-CM | POA: Insufficient documentation

## 2019-12-20 DIAGNOSIS — H6982 Other specified disorders of Eustachian tube, left ear: Secondary | ICD-10-CM

## 2019-12-20 DIAGNOSIS — F17200 Nicotine dependence, unspecified, uncomplicated: Secondary | ICD-10-CM | POA: Diagnosis not present

## 2019-12-20 DIAGNOSIS — M79604 Pain in right leg: Secondary | ICD-10-CM

## 2019-12-20 LAB — T-HELPER CELL (CD4) - (RCID CLINIC ONLY)
CD4 % Helper T Cell: 26 % — ABNORMAL LOW (ref 33–65)
CD4 T Cell Abs: 716 /uL (ref 400–1790)

## 2019-12-20 MED ORDER — DARUN-COBIC-EMTRICIT-TENOFAF 800-150-200-10 MG PO TABS: 1.0000 | ORAL_TABLET | Freq: Every day | ORAL | 5 refills | Status: DC

## 2019-12-20 MED ORDER — NICOTINE 21 MG/24HR TD PT24: 21.0000 mg | MEDICATED_PATCH | Freq: Every day | TRANSDERMAL | 1 refills | Status: DC

## 2019-12-20 NOTE — Patient Instructions (Signed)
Nice to see you.  We will check your blood work today.  Continue take your Symtuza as prescribed.  Finish the prednisone as prescribed.  Use Tylenol as needed for pain while on prednisone.  Plan for follow-up in 4 months or sooner if needed.  Have a great day and stay safe!

## 2019-12-20 NOTE — Progress Notes (Signed)
Subjective:    Patient ID: Gabriel [name], male    DOB: 1976/04/03, 44 y.o.   MRN: [id number]  Chief Complaint  Patient presents with  . Follow-up    ear pain over a month. leg pain     HPI:  Gabriel [name] is a 44 y.o. male with HIV disease who was last seen in the office on 08/20/2019 with good adherence and tolerance to his ART regimen of Symtuza.  Viral load at the time was 156 with CD4 count of 353.  No recent blood work completed. In the interim he has been seen in the ED and by ENT for ear pain.   Gabriel [name] continues to take his Symtuza as prescribed with no adverse side effects or missed doses since his last office visit.  Overall feeling okay today but has concerns regarding congestion/eustachian tube dysfunction of the left ear as well as bilateral lower extremity pains that he has been having over the last 2 days.  Describes having been in [place] towards the end of January and since that time has had congestion.  No fevers, chills, or sweats.  Symptoms have improved slightly although continues to have mildly decreased hearing out of the left ear.  Has been treated by ENT and placed on prednisone and amoxicillin.  He has also been experiencing bilateral lower extremity leg and joint pain going on for approximately 2 days described as throbbing at night.  No trauma or injury that he can recall.  Severity is enough that he has to push up out of the chair at times from a seated position.  Gabriel [name] has no problems obtaining his medication from the pharmacy and remains covered through [facility].  Denies feelings of being down, depressed, or hopeless recently.  He does continue to consume alcohol socially with no recreational or illicit drug use currently.  Smoking approximately 1 pack of cigarettes per day on average and is working on quitting with nicotine patch.  Allergies  Allergen Reactions  . Abacavir     HLA [id number] positive - should not receive abacavir due to  risk of hypersensitivity      Outpatient Medications Prior to Visit  Medication Sig Dispense Refill  . fluticasone (FLONASE) 50 MCG/ACT nasal spray Place 2 sprays into both nostrils daily.    . predniSONE (STERAPRED UNI-PAK 48 TAB) 5 MG (48) TBPK tablet TAKE AS DIRECTED FOR 12 DAYS    . Darunavir-Cobicisctat-Emtricitabine-Tenofovir Alafenamide (SYMTUZA) 800-150-200-10 MG TABS Take 1 tablet by mouth daily with breakfast. 30 tablet 5  . nicotine (NICODERM CQ) 21 mg/24hr patch Place 1 patch (21 mg total) onto the skin daily. 28 patch 1  . fexofenadine-pseudoephedrine (ALLEGRA-D) 60-120 MG 12 hr tablet Take 1 tablet by mouth 2 (two) times daily. (Patient not taking: Reported on 12/20/2019) 20 tablet 0  . amoxicillin (AMOXIL) 875 MG tablet Take 1 tablet (875 mg total) by mouth 2 (two) times daily. (Patient not taking: Reported on 12/20/2019) 220 tablet 0   No facility-administered medications prior to visit.     Past Medical History:  Diagnosis Date  . Abscess    buttocks  . Cellulitis   . HIV positive ([place])   . MRSA infection      Past Surgical History:  Procedure Laterality Date  . HAND / FINGER LESION EXCISION         Review of Systems  Constitutional: Negative for appetite change, chills, fatigue, fever and unexpected weight change.  HENT: Positive  for congestion.        Positive for ear fullness  Eyes: Negative for visual disturbance.  Respiratory: Negative for cough, chest tightness, shortness of breath and wheezing.   Cardiovascular: Negative for chest pain and leg swelling.  Gastrointestinal: Negative for abdominal pain, constipation, diarrhea, nausea and vomiting.  Genitourinary: Negative for dysuria, flank pain, frequency, genital sores, hematuria and urgency.  Musculoskeletal:       Positive for bilateral lower extremity pain.  Skin: Negative for rash.  Allergic/Immunologic: Negative for immunocompromised state.  Neurological: Negative for dizziness and headaches.       Objective:    BP 125/86   Pulse 96   Temp 98.4 F (36.9 C) (Oral)   Wt 220 lb (99.8 kg)   SpO2 99%   BMI 25.42 kg/m  Nursing note and vital signs reviewed.  Physical Exam Constitutional:      General: He is not in acute distress.    Appearance: He is well-developed.  HENT:     Right Ear: Tympanic membrane and ear canal normal. There is no impacted cerumen.     Left Ear: Tympanic membrane and ear canal normal. There is no impacted cerumen.  Eyes:     Conjunctiva/sclera: Conjunctivae normal.  Cardiovascular:     Rate and Rhythm: Normal rate and regular rhythm.     Heart sounds: Normal heart sounds. No murmur. No friction rub. No gallop.   Pulmonary:     Effort: Pulmonary effort is normal. No respiratory distress.     Breath sounds: Normal breath sounds. No wheezing or rales.  Chest:     Chest wall: No tenderness.  Abdominal:     General: Bowel sounds are normal.     Palpations: Abdomen is soft.     Tenderness: There is no abdominal tenderness.  Musculoskeletal:     Cervical back: Neck supple.     Comments: Bilateral lower extremities with no obvious deformity, discoloration, or edema no calf tenderness.  Strength is 4+ out of 5 bilaterally.  Lymphadenopathy:     Cervical: No cervical adenopathy.  Skin:    General: Skin is warm and dry.     Findings: No rash.  Neurological:     Mental Status: He is alert and oriented to person, place, and time.  Psychiatric:        Behavior: Behavior normal.        Thought Content: Thought content normal.        Judgment: Judgment normal.      Depression screen [facility] 2/9 08/20/2019 10/15/2018 11/23/2017 06/27/2017 04/08/2015  Decreased Interest 0 0 0 0 0  Down, Depressed, Hopeless 0 0 0 0 0  PHQ - 2 Score 0 0 0 0 0       Assessment & Plan:    Patient Active Problem List   Diagnosis Date Noted  . Eustachian tube dysfunction 12/20/2019  . Bilateral lower extremity pain 12/20/2019  . Cough 04/30/2019  . Healthcare  maintenance 01/18/2019  . Genital warts 06/27/2017  . IVDU (intravenous drug user) 06/27/2017  . Depression 06/27/2017  . Hematochezia 03/26/2015  . Dermatitis 06/10/2014  . Dyslipidemia 04/14/2011  . Blood in stool 03/20/2009  . 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 DENTAL CARIES 01/02/2009  . TOBACCO USER 05/27/2008  . INSOMNIA, CHRONIC 05/27/2008  . Human immunodeficiency virus (HIV) disease ([place]) 11/28/2006  . SYPHILIS NOS 11/28/2006     Problem List Items Addressed This Visit      Nervous and Auditory  Eustachian tube dysfunction    Gabriel [name] continues to have signs/symptoms that appear consistent with eustachian tube dysfunction and remains on prednisone.  Discussed this is likely related to congestion and it will take several weeks for it to clear up.  He has had no acute losses in hearing or drainage.  Complete prednisone as previously prescribed.        Other   Human immunodeficiency virus (HIV) disease (HCC) - Primary    Gabriel [name] has well-controlled HIV disease with good adherence and tolerance to his ART regimen of Symtuza.  No signs/symptoms of opportunistic infection or progressive HIV disease.  Check blood work today.  We reviewed the plan of care with time for questions.  Continue current dose of Symtuza.  Plan for follow-up in 4 months or sooner if needed.      Relevant Medications   Darunavir-Cobicisctat-Emtricitabine-Tenofovir Alafenamide (SYMTUZA) 800-150-200-10 MG TABS   Other Relevant Orders   COMPLETE METABOLIC PANEL WITH GFR   T-helper cell (CD4)- ([facility] clinic only)   HIV-1 RNA quant-no reflex-bld   TOBACCO USER   Relevant Medications   nicotine (NICODERM CQ) 21 mg/24hr patch   Bilateral lower extremity pain    New acute onset bilateral lower extremity pain with unclear origin going on for approximately 2 days.  He did travel recently however currently has no calf pain that would be worrisome for DVT.  May be related to  medications given prednisone with his current Symtuza.  Encouraged use Tylenol as needed for pain and complete his prednisone as prescribed.  If symptoms worsen or do not improve consider referral to sports medicine for further evaluation.       Other Visit Diagnoses    Screening for STDs (sexually transmitted diseases)       Relevant Orders   RPR       I have discontinued Marina L. Engelstad's amoxicillin. I am also having him maintain his fexofenadine-pseudoephedrine, fluticasone, predniSONE, Darunavir-Cobicisctat-Emtricitabine-Tenofovir Alafenamide, and nicotine.   Meds ordered this encounter  Medications  . Darunavir-Cobicisctat-Emtricitabine-Tenofovir Alafenamide (SYMTUZA) 800-150-200-10 MG TABS    Sig: Take 1 tablet by mouth daily with breakfast.    Dispense:  30 tablet    Refill:  5    Order Specific Question:   Supervising Provider    Answer:   SNIDER, CYNTHIA [[id number]]  . nicotine (NICODERM CQ) 21 mg/24hr patch    Sig: Place 1 patch (21 mg total) onto the skin daily.    Dispense:  28 patch    Refill:  1    Order Specific Question:   Supervising Provider    Answer:   SNIDER, CYNTHIA [[id number]]     Follow-up: Return in about 4 months (around 04/18/2020), or if symptoms worsen or fail to improve.   Greg Calone, MSN, FNP-C Nurse Practitioner [facility] Main number: [phone]   

## 2019-12-20 NOTE — Assessment & Plan Note (Signed)
Mr. Gabriel [name] has well-controlled HIV disease with good adherence and tolerance to his ART regimen of Symtuza.  No signs/symptoms of opportunistic infection or progressive HIV disease.  Check blood work today.  We reviewed the plan of care with time for questions.  Continue current dose of Symtuza.  Plan for follow-up in 4 months or sooner if needed.

## 2019-12-20 NOTE — Assessment & Plan Note (Signed)
New acute onset bilateral lower extremity pain with unclear origin going on for approximately 2 days.  He did travel recently however currently has no calf pain that would be worrisome for DVT.  May be related to medications given prednisone with his current Symtuza.  Encouraged use Tylenol as needed for pain and complete his prednisone as prescribed.  If symptoms worsen or do not improve consider referral to sports medicine for further evaluation.

## 2019-12-20 NOTE — Assessment & Plan Note (Signed)
Mr. Gabriel [name] continues to have signs/symptoms that appear consistent with eustachian tube dysfunction and remains on prednisone.  Discussed this is likely related to congestion and it will take several weeks for it to clear up.  He has had no acute losses in hearing or drainage.  Complete prednisone as previously prescribed.

## 2019-12-25 LAB — RPR TITER: RPR Titer: 1:2 {titer} — ABNORMAL HIGH

## 2019-12-25 LAB — COMPLETE METABOLIC PANEL WITH GFR
AG Ratio: 1.6 (calc) (ref 1.0–2.5)
ALT: 10 U/L (ref 9–46)
AST: 11 U/L (ref 10–40)
Albumin: 4.1 g/dL (ref 3.6–5.1)
Alkaline phosphatase (APISO): 80 U/L (ref 36–130)
BUN: 15 mg/dL (ref 7–25)
CO2: 30 mmol/L (ref 20–32)
Calcium: 9.2 mg/dL (ref 8.6–10.3)
Chloride: 104 mmol/L (ref 98–110)
Creat: 0.96 mg/dL (ref 0.60–1.35)
GFR, Est African American: 112 mL/min/{1.73_m2} (ref >=60)
GFR, Est Non African American: 96 mL/min/{1.73_m2} (ref >=60)
Globulin: 2.6 g/dL (calc) (ref 1.9–3.7)
Glucose, Bld: 78 mg/dL (ref 65–99)
Potassium: 3.6 mmol/L (ref 3.5–5.3)
Sodium: 141 mmol/L (ref 135–146)
Total Bilirubin: 0.3 mg/dL (ref 0.2–1.2)
Total Protein: 6.7 g/dL (ref 6.1–8.1)

## 2019-12-25 LAB — FLUORESCENT TREPONEMAL AB(FTA)-IGG-BLD: Fluorescent Treponemal ABS: REACTIVE — AB

## 2019-12-25 LAB — RPR: RPR Ser Ql: REACTIVE — AB

## 2019-12-25 LAB — HIV-1 RNA QUANT-NO REFLEX-BLD
HIV 1 RNA Quant: <20 copies/mL — AB
HIV-1 RNA Quant, Log: <1.3 Log copies/mL — AB

## 2020-01-05 ENCOUNTER — Emergency Department (HOSPITAL_COMMUNITY)
Admission: EM | Admit: 2020-01-05 | Discharge: 2020-01-06 | Disposition: A | Discharge status: Home / Self Care | Payer: BC Managed Care – PPO | Attending: Emergency Medicine | Admitting: Emergency Medicine

## 2020-01-05 ENCOUNTER — Encounter (HOSPITAL_COMMUNITY): Payer: Self-pay | Admitting: Pediatrics

## 2020-01-05 ENCOUNTER — Other Ambulatory Visit: Payer: Self-pay

## 2020-01-05 DIAGNOSIS — F15259 Other stimulant dependence with stimulant-induced psychotic disorder, unspecified: Secondary | ICD-10-CM | POA: Diagnosis not present

## 2020-01-05 DIAGNOSIS — R0689 Other abnormalities of breathing: Secondary | ICD-10-CM | POA: Diagnosis not present

## 2020-01-05 DIAGNOSIS — F17210 Nicotine dependence, cigarettes, uncomplicated: Secondary | ICD-10-CM | POA: Diagnosis not present

## 2020-01-05 DIAGNOSIS — S1181XA Laceration without foreign body of other specified part of neck, initial encounter: Secondary | ICD-10-CM | POA: Diagnosis not present

## 2020-01-05 DIAGNOSIS — F14259 Cocaine dependence with cocaine-induced psychotic disorder, unspecified: Secondary | ICD-10-CM | POA: Insufficient documentation

## 2020-01-05 DIAGNOSIS — Z79899 Other long term (current) drug therapy: Secondary | ICD-10-CM | POA: Diagnosis not present

## 2020-01-05 DIAGNOSIS — Z21 Asymptomatic human immunodeficiency virus [HIV] infection status: Secondary | ICD-10-CM | POA: Diagnosis not present

## 2020-01-05 DIAGNOSIS — Y999 Unspecified external cause status: Secondary | ICD-10-CM | POA: Insufficient documentation

## 2020-01-05 DIAGNOSIS — R45851 Suicidal ideations: Secondary | ICD-10-CM | POA: Insufficient documentation

## 2020-01-05 DIAGNOSIS — Z046 Encounter for general psychiatric examination, requested by authority: Secondary | ICD-10-CM

## 2020-01-05 DIAGNOSIS — F329 Major depressive disorder, single episode, unspecified: Secondary | ICD-10-CM | POA: Insufficient documentation

## 2020-01-05 DIAGNOSIS — F29 Unspecified psychosis not due to a substance or known physiological condition: Secondary | ICD-10-CM | POA: Diagnosis not present

## 2020-01-05 DIAGNOSIS — R441 Visual hallucinations: Secondary | ICD-10-CM | POA: Diagnosis not present

## 2020-01-05 DIAGNOSIS — S1191XA Laceration without foreign body of unspecified part of neck, initial encounter: Secondary | ICD-10-CM | POA: Diagnosis not present

## 2020-01-05 DIAGNOSIS — Y939 Activity, unspecified: Secondary | ICD-10-CM | POA: Diagnosis not present

## 2020-01-05 DIAGNOSIS — Y929 Unspecified place or not applicable: Secondary | ICD-10-CM | POA: Diagnosis not present

## 2020-01-05 DIAGNOSIS — Z20822 Contact with and (suspected) exposure to covid-19: Secondary | ICD-10-CM | POA: Insufficient documentation

## 2020-01-05 DIAGNOSIS — S61512A Laceration without foreign body of left wrist, initial encounter: Secondary | ICD-10-CM | POA: Diagnosis not present

## 2020-01-05 DIAGNOSIS — T1491XA Suicide attempt, initial encounter: Secondary | ICD-10-CM | POA: Diagnosis not present

## 2020-01-05 DIAGNOSIS — X789XXA Intentional self-harm by unspecified sharp object, initial encounter: Secondary | ICD-10-CM | POA: Diagnosis not present

## 2020-01-05 DIAGNOSIS — R58 Hemorrhage, not elsewhere classified: Secondary | ICD-10-CM | POA: Diagnosis not present

## 2020-01-05 DIAGNOSIS — F1020 Alcohol dependence, uncomplicated: Secondary | ICD-10-CM | POA: Diagnosis not present

## 2020-01-05 DIAGNOSIS — R451 Restlessness and agitation: Secondary | ICD-10-CM | POA: Diagnosis present

## 2020-01-05 DIAGNOSIS — R000 Tachycardia, unspecified: Secondary | ICD-10-CM | POA: Diagnosis not present

## 2020-01-05 LAB — COMPREHENSIVE METABOLIC PANEL
ALT: 38 U/L (ref 0–44)
ALT: 34 U/L (ref 0–44)
AST: 62 U/L — ABNORMAL HIGH (ref 15–41)
AST: 58 U/L — ABNORMAL HIGH (ref 15–41)
Albumin: 4.5 g/dL (ref 3.5–5.0)
Albumin: 4 g/dL (ref 3.5–5.0)
Alkaline Phosphatase: 78 U/L (ref 38–126)
Alkaline Phosphatase: 67 U/L (ref 38–126)
Anion gap: 14 (ref 5–15)
Anion gap: 11 (ref 5–15)
BUN: 21 mg/dL — ABNORMAL HIGH (ref 6–20)
BUN: 18 mg/dL (ref 6–20)
CO2: 24 mmol/L (ref 22–32)
CO2: 22 mmol/L (ref 22–32)
Calcium: 9.8 mg/dL (ref 8.9–10.3)
Calcium: 8.8 mg/dL — ABNORMAL LOW (ref 8.9–10.3)
Chloride: 99 mmol/L (ref 98–111)
Chloride: 102 mmol/L (ref 98–111)
Creatinine, Ser: 1.79 mg/dL — ABNORMAL HIGH (ref 0.61–1.24)
Creatinine, Ser: 1.34 mg/dL — ABNORMAL HIGH (ref 0.61–1.24)
GFR calc Af Amer: 53 mL/min — ABNORMAL LOW (ref >60)
GFR calc Af Amer: >60 mL/min (ref >60)
GFR calc non Af Amer: 45 mL/min — ABNORMAL LOW (ref >60)
GFR calc non Af Amer: >60 mL/min (ref >60)
Glucose, Bld: 89 mg/dL (ref 70–99)
Glucose, Bld: 109 mg/dL — ABNORMAL HIGH (ref 70–99)
Potassium: 3.2 mmol/L — ABNORMAL LOW (ref 3.5–5.1)
Potassium: 3.8 mmol/L (ref 3.5–5.1)
Sodium: 137 mmol/L (ref 135–145)
Sodium: 135 mmol/L (ref 135–145)
Total Bilirubin: 1.3 mg/dL — ABNORMAL HIGH (ref 0.3–1.2)
Total Bilirubin: 1.2 mg/dL (ref 0.3–1.2)
Total Protein: 8.2 g/dL — ABNORMAL HIGH (ref 6.5–8.1)
Total Protein: 7.2 g/dL (ref 6.5–8.1)

## 2020-01-05 LAB — RAPID URINE DRUG SCREEN, HOSP PERFORMED
Amphetamines: POSITIVE — AB
Barbiturates: NOT DETECTED
Benzodiazepines: NOT DETECTED
Cocaine: POSITIVE — AB
Opiates: NOT DETECTED
Tetrahydrocannabinol: NOT DETECTED

## 2020-01-05 LAB — CBC
HCT: 45.3 % (ref 39.0–52.0)
Hemoglobin: 15 g/dL (ref 13.0–17.0)
MCH: 32.2 pg (ref 26.0–34.0)
MCHC: 33.1 g/dL (ref 30.0–36.0)
MCV: 97.2 fL (ref 80.0–100.0)
Platelets: 164 10*3/uL (ref 150–400)
RBC: 4.66 MIL/uL (ref 4.22–5.81)
RDW: 13.9 % (ref 11.5–15.5)
WBC: 9.6 10*3/uL (ref 4.0–10.5)
nRBC: 0 % (ref 0.0–0.2)

## 2020-01-05 LAB — ETHANOL: Alcohol, Ethyl (B): <10 mg/dL (ref <10)

## 2020-01-05 LAB — RESPIRATORY PANEL BY RT PCR (FLU A&B, COVID)
Influenza A by PCR: NEGATIVE
Influenza B by PCR: NEGATIVE
SARS Coronavirus 2 by RT PCR: NEGATIVE

## 2020-01-05 LAB — SALICYLATE LEVEL: Salicylate Lvl: <7 mg/dL — ABNORMAL LOW (ref 7.0–30.0)

## 2020-01-05 LAB — ACETAMINOPHEN LEVEL: Acetaminophen (Tylenol), Serum: <10 ug/mL — ABNORMAL LOW (ref 10–30)

## 2020-01-05 MED ORDER — LORAZEPAM 2 MG/ML IJ SOLN
2.0000 mg | Freq: Once | INTRAMUSCULAR | Status: AC
  Administered 2020-01-05: 2 mg via INTRAMUSCULAR
  Filled 2020-01-05: qty 1

## 2020-01-05 MED ORDER — SODIUM CHLORIDE 0.9 % IV BOLUS
1000.0000 mL | Freq: Once | INTRAVENOUS | Status: AC
  Administered 2020-01-05: 1000 mL via INTRAVENOUS

## 2020-01-05 MED ORDER — LORAZEPAM 2 MG/ML IJ SOLN: 2.0000 mg | Freq: Once | INTRAMUSCULAR | Status: DC

## 2020-01-05 MED ORDER — LIDOCAINE-EPINEPHRINE-TETRACAINE (LET) TOPICAL GEL
3.0000 mL | Freq: Once | TOPICAL | Status: AC
  Administered 2020-01-05: 3 mL via TOPICAL
  Filled 2020-01-05: qty 3

## 2020-01-05 MED ORDER — LORAZEPAM 2 MG/ML IJ SOLN
INTRAMUSCULAR | Status: AC
  Filled 2020-01-05: qty 1

## 2020-01-05 MED ORDER — HALOPERIDOL 5 MG PO TABS
5.0000 mg | ORAL_TABLET | Freq: Two times a day (BID) | ORAL | Status: DC
  Administered 2020-01-05 (×2): 5 mg via ORAL
  Filled 2020-01-05 (×2): qty 1

## 2020-01-05 MED ORDER — FLUTICASONE PROPIONATE 50 MCG/ACT NA SUSP
2.0000 | Freq: Two times a day (BID) | NASAL | Status: DC | PRN
  Filled 2020-01-05: qty 16

## 2020-01-05 MED ORDER — LORAZEPAM 2 MG/ML IJ SOLN
2.0000 mg | Freq: Once | INTRAMUSCULAR | Status: AC
  Administered 2020-01-05: 2 mg via INTRAVENOUS

## 2020-01-05 MED ORDER — DARUN-COBIC-EMTRICIT-TENOFAF 800-150-200-10 MG PO TABS
1.0000 | ORAL_TABLET | Freq: Every day | ORAL | Status: DC
  Administered 2020-01-05: 1 via ORAL
  Filled 2020-01-05 (×2): qty 1

## 2020-01-05 MED ORDER — LORAZEPAM 2 MG/ML IJ SOLN
2.0000 mg | Freq: Once | INTRAMUSCULAR | Status: AC
  Administered 2020-01-05: 2 mg via INTRAVENOUS
  Filled 2020-01-05: qty 1

## 2020-01-05 MED ORDER — NICOTINE 21 MG/24HR TD PT24: 21.0000 mg | MEDICATED_PATCH | Freq: Every day | TRANSDERMAL | Status: DC

## 2020-01-05 MED ORDER — ACETAMINOPHEN 500 MG PO TABS
500.0000 mg | ORAL_TABLET | Freq: Once | ORAL | Status: AC
  Administered 2020-01-05: 500 mg via ORAL
  Filled 2020-01-05: qty 1

## 2020-01-05 NOTE — ED Notes (Signed)
Left wrist lacerations repaired by EDP; non adherent dressing applied + kerlix. Wrist splint placed per EDP request. Patient tolerated well.

## 2020-01-05 NOTE — ED Notes (Signed)
Pt states he is hungry - sandwich and Coke given.

## 2020-01-05 NOTE — ED Notes (Addendum)
Pt arrived to [facility]m [facility] via stretcher - Pt ambulated to bathroom and then to room - w/staff assistance x 2. Pt had to be reminded to open his eyes. Pt noted to be talking and mumbling to self. Pt is oriented x 4. Pt noted w/splint to left wrist/forearm and IV to right forearm - intact - no drainage or swelling noted. Pt also noted w/multiple abrasions to right neck - no drainage noted. Pt able to move hands/wrists/fingers w/o difficulty. Pt noted to be wearing burgundy scrub top and blue paper scrub pants over his own pants. Pt noted to be moving about on bed continuously - rocking and talking to himself.

## 2020-01-05 NOTE — ED Notes (Addendum)
Pt noted to be restless and manic. Pt noted to be moaning, talking, and crying loudly. Pt unable to remain still. Pt ate snack given. Pt is asking for cell phone - reminded pt his cell phone is locked up w/Security. Message sent to [facility] NP, Melvyn Neth, requesting med recommendations.

## 2020-01-05 NOTE — ED Provider Notes (Signed)
..Laceration Repair  Date/Time: 01/05/2020 9:39 AM Performed by: Tacy Learn, PA-C Authorized by: Tacy Learn, PA-C   Consent:    Consent obtained:  Verbal   Consent given by:  Patient   Risks discussed:  Infection, need for additional repair, pain, poor cosmetic result and poor wound healing   Alternatives discussed:  No treatment and delayed treatment Universal protocol:    Procedure explained and questions answered to patient or proxy's satisfaction: yes     Relevant documents present and verified: yes     Test results available and properly labeled: yes     Imaging studies available: yes     Required blood products, implants, devices, and special equipment available: yes     Site/side marked: yes     Immediately prior to procedure, a time out was called: yes     Patient identity confirmed:  Verbally with patient and arm band Anesthesia (see MAR for exact dosages):    Anesthesia method:  None Laceration details:    Location:  Neck   Neck location:  R anterior   Length (cm):  5   Depth (mm):  2 Repair type:    Repair type:  Simple Pre-procedure details:    Preparation:  Patient was prepped and draped in usual sterile fashion Exploration:    Wound exploration: wound explored through full range of motion and entire depth of wound probed and visualized     Wound extent: no muscle damage noted     Contaminated: no   Treatment:    Area cleansed with:  Saline   Amount of cleaning:  Standard   Irrigation solution:  Sterile saline Skin repair:    Repair method:  Tissue adhesive Approximation:    Approximation:  Close Post-procedure details:    Dressing:  Open (no dressing)   Patient tolerance of procedure:  Tolerated well, no immediate complications .Marland KitchenLaceration Repair  Date/Time: 01/05/2020 9:40 AM Performed by: Tacy Learn, PA-C Authorized by: Tacy Learn, PA-C   Consent:    Consent obtained:  Verbal   Consent given by:  Patient   Risks discussed:   Infection, need for additional repair, pain, poor cosmetic result and poor wound healing   Alternatives discussed:  No treatment and delayed treatment Universal protocol:    Procedure explained and questions answered to patient or proxy's satisfaction: yes     Relevant documents present and verified: yes     Test results available and properly labeled: yes     Imaging studies available: yes     Required blood products, implants, devices, and special equipment available: yes     Site/side marked: yes     Immediately prior to procedure, a time out was called: yes     Patient identity confirmed:  Verbally with patient       Anesthesia (see MAR for exact dosages):    Anesthesia method:  Local infiltration   Local anesthetic:  Lidocaine 1% w/o epi Laceration details:    Location:  Shoulder/arm   Shoulder/arm location:  L lower arm Repair type:    Repair type:  Complex Pre-procedure details:    Preparation:  Patient was prepped and draped in usual sterile fashion Exploration:    Hemostasis achieved with:  Direct pressure   Wound exploration: wound explored through full range of motion and entire depth of wound probed and visualized     Wound extent: no foreign bodies/material noted and no muscle damage noted     Contaminated: no  Treatment:    Area cleansed with:  Saline   Amount of cleaning:  Extensive   Irrigation solution:  Sterile saline Skin repair:    Repair method:  Sutures   Suture size:  4-0   Suture material:  Nylon Post-procedure details:    Dressing:  Bulky dressing and splint for protection   Patient tolerance of procedure:  Tolerated well, no immediate complications Comments:     4 mattress 2 staples  Cumulative length approx  20cm    Alden Hipp 01/05/20 1017    Tegeler, Canary Brim, MD 01/05/20 1711

## 2020-01-05 NOTE — ED Notes (Signed)
IVC papers - Copy faxed to [facility] - Copy sent to Medical Records - Original placed in folder for Magistrate - ALL 3 sets on clipboard.  

## 2020-01-05 NOTE — ED Notes (Signed)
Pt noted to be more alert at this time. Pt given remote to tv as requested. Pt asking for his car keys and cell phone - advised pt his belongings have been inventoried and are locked up for safety. Pt states he wants to know the full story of why he is here as he states he does not recall all of it and asking if his family/friends are here. Advised pt no one is here and he may use the phone at nurses' desk - 2 phone calls/5-minutes each. Pt declined to call at this time.

## 2020-01-05 NOTE — ED Triage Notes (Signed)
Arrived accompanied by [employer] and EMS. Reported suicidal after meth use. Patient arrived w/ superficial laceration on right neck area. Reported laceration on left wrist as well w/ dressing in place and bleeding is controlled.
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
## 2020-01-05 NOTE — ED Notes (Signed)
Dinner tray delivered - pt stated he did not like the food and requested to special order a meal tray - advised pt House Trays only - Pt talking loudly to himself. Pt asking again for cell phone - stated "I need to check the internet to see if my story is on there". Advised pt unable to provide. Pt given Tylenol for c/o pain to left wrist lacerations and Pt tolerated Ativan injection well. Pt noted to be lying on bed w/eyes closed. Respirations even, unlabored.

## 2020-01-05 NOTE — Progress Notes (Signed)
    01/05/20 1152  General Assessment Data  Reason for not completing assessment [facility] is unable to complete assessment at this time because "pt was administered Ativan and is currently asleep" per pt's nurse Magnus Ivan, RN.  TTS will assess pt at a later time.   Maylee Bare L. Iniya Matzek, MS, [facility] Therapeutic Triage Specialist  [phone]

## 2020-01-05 NOTE — ED Provider Notes (Signed)
[facility] EMERGENCY DEPARTMENT Provider Note   CSN: [id number] Arrival date & time: 01/05/20  0737     History Chief Complaint  Patient presents with  . Suicidal    Gabriel [name] is a 44 y.o. male.  The history is provided by the patient and medical records. No language interpreter was used.  Mental Health Problem Presenting symptoms: agitation, hallucinations, self-mutilation, suicidal thoughts and suicide attempt   Presenting symptoms: no homicidal ideas   Patient accompanied by:  Law enforcement Degree of incapacity (severity):  Severe Onset quality:  Gradual Timing:  Constant Progression:  Worsening Chronicity:  Recurrent Context: drug abuse   Treatment compliance:  Untreated Relieved by:  Nothing Worsened by:  Drugs Ineffective treatments:  None tried Associated symptoms: no abdominal pain, no chest pain and no headaches   Risk factors: hx of suicide attempts        Past Medical History:  Diagnosis Date  . Abscess    buttocks  . Cellulitis   . HIV positive (HCC)   . MRSA infection     Patient Active Problem List   Diagnosis Date Noted  . Eustachian tube dysfunction 12/20/2019  . Bilateral lower extremity pain 12/20/2019  . Cough 04/30/2019  . Healthcare maintenance 01/18/2019  . Genital warts 06/27/2017  . IVDU (intravenous drug user) 06/27/2017  . Depression 06/27/2017  . Hematochezia 03/26/2015  . Dermatitis 06/10/2014  . Dyslipidemia 04/14/2011  . Blood in stool 03/20/2009  . 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 DENTAL CARIES 01/02/2009  . TOBACCO USER 05/27/2008  . INSOMNIA, CHRONIC 05/27/2008  . Human immunodeficiency virus (HIV) disease (HCC) 11/28/2006  . SYPHILIS NOS 11/28/2006    Past Surgical History:  Procedure Laterality Date  . HAND / FINGER LESION EXCISION         Family History  Problem Relation Age of Onset  . Hypertension Mother   . Cancer Maternal Grandmother   . Crohn's  disease Brother     Social History   Tobacco Use  . Smoking status: Current Every Day Smoker    Packs/day: 1.00    Types: Cigarettes  . Smokeless tobacco: Never Used  Substance Use Topics  . Alcohol use: Yes    Alcohol/week: 3.0 standard drinks    Types: 3 Standard drinks or equivalent per week    Comment: socially   . Drug use: No    Types: Cocaine, IV, Methamphetamines    Comment: only meth at this point    Home Medications Prior to Admission medications   Medication Sig Start Date End Date Taking? Authorizing Provider  Darunavir-Cobicisctat-Emtricitabine-Tenofovir Alafenamide (SYMTUZA) 800-150-200-10 MG TABS Take 1 tablet by mouth daily with breakfast. 12/20/19   Veryl Speak, FNP  fexofenadine-pseudoephedrine (ALLEGRA-D) 60-120 MG 12 hr tablet Take 1 tablet by mouth 2 (two) times daily. Patient not taking: Reported on 12/20/2019 11/27/19   Joni Reining, PA-C  fluticasone [facility]) 50 MCG/ACT nasal spray Place 2 sprays into both nostrils daily. 12/13/19   [provider]  nicotine (NICODERM CQ) 21 mg/24hr patch Place 1 patch (21 mg total) onto the skin daily. 12/20/19   Veryl Speak, FNP  predniSONE (STERAPRED UNI-PAK 48 TAB) 5 MG (48) TBPK tablet TAKE AS DIRECTED FOR 12 DAYS 12/13/19   [provider]    Allergies    Abacavir  Review of Systems   Review of Systems  Constitutional: Negative for chills and fever.  HENT: Negative for congestion.   Respiratory: Negative for  cough, chest tightness, shortness of breath and wheezing.   Cardiovascular: Negative for chest pain and palpitations.  Gastrointestinal: Negative for abdominal pain, constipation, diarrhea, nausea and vomiting.  Genitourinary: Negative for flank pain.  Musculoskeletal: Negative for back pain.  Skin: Negative for wound.  Neurological: Negative for headaches.  Psychiatric/Behavioral: Positive for agitation, hallucinations, self-injury and suicidal ideas. Negative for confusion and  homicidal ideas.  All other systems reviewed and are negative.   Physical Exam Updated Vital Signs BP 110/63   Pulse (!) 103   Temp 99.5 F (37.5 C) (Oral)   Resp 20   Ht 6\' 6"  (1.981 m)   Wt 100.7 kg   SpO2 98%   BMI 25.65 kg/m   Physical Exam Vitals and nursing note reviewed.  Constitutional:      General: He is not in acute distress.    Appearance: He is well-developed. He is not ill-appearing, toxic-appearing or diaphoretic.  HENT:     Head: Normocephalic and atraumatic.     Nose: No congestion or rhinorrhea.     Mouth/Throat:     Mouth: Mucous membranes are moist.     Pharynx: No oropharyngeal exudate or posterior oropharyngeal erythema.  Eyes:     Extraocular Movements: Extraocular movements intact.     Conjunctiva/sclera: Conjunctivae normal.     Pupils: Pupils are equal, round, and reactive to light.  Cardiovascular:     Rate and Rhythm: Normal rate and regular rhythm.     Heart sounds: No murmur.  Pulmonary:     Effort: Pulmonary effort is normal. No respiratory distress.     Breath sounds: Normal breath sounds.  Abdominal:     Palpations: Abdomen is soft.     Tenderness: There is no abdominal tenderness.  Musculoskeletal:        General: Tenderness present.     Cervical back: Neck supple.  Skin:    General: Skin is warm and dry.     Capillary Refill: Capillary refill takes less than 2 seconds.  Neurological:     General: No focal deficit present.     Mental Status: He is alert.     Sensory: No sensory deficit.     Motor: No weakness.  Psychiatric:        Attention and Perception: He perceives auditory and visual hallucinations.        Mood and Affect: Mood is anxious.        Behavior: Behavior is agitated.        Thought Content: Thought content includes suicidal ideation. Thought content does not include homicidal ideation. Thought content includes suicidal plan. Thought content does not include homicidal plan.     Comments: Patient is agitated and  responding to external stimuli talking to himself and seeing other people that are not present. He reports current SI with a plan to slit his wrists which he acted on last night. He reports he has been using drugs.             ED Results / Procedures / Treatments   Labs (all labs ordered are listed, but only abnormal results are displayed) Labs Reviewed  COMPREHENSIVE METABOLIC PANEL - Abnormal; Notable for the following components:      Result Value   Potassium 3.2 (*)    BUN 21 (*)    Creatinine, Ser 1.79 (*)    Total Protein 8.2 (*)    AST 62 (*)    Total Bilirubin 1.3 (*)    GFR calc non Af  Amer 45 (*)    GFR calc Af Amer 53 (*)    All other components within normal limits  SALICYLATE LEVEL - Abnormal; Notable for the following components:   Salicylate Lvl <3.5 (*)    All other components within normal limits  ACETAMINOPHEN LEVEL - Abnormal; Notable for the following components:   Acetaminophen (Tylenol), Serum <10 (*)    All other components within normal limits  RAPID URINE DRUG SCREEN, HOSP PERFORMED - Abnormal; Notable for the following components:   Cocaine POSITIVE (*)    Amphetamines POSITIVE (*)    All other components within normal limits  COMPREHENSIVE METABOLIC PANEL - Abnormal; Notable for the following components:   Glucose, Bld 109 (*)    Creatinine, Ser 1.34 (*)    Calcium 8.8 (*)    AST 58 (*)    All other components within normal limits  RESPIRATORY PANEL BY RT PCR (FLU A&B, COVID)  ETHANOL  CBC    EKG EKG Interpretation  Date/Time:  Sunday January 05 2020 10:56:15 EDT Ventricular Rate:  102 PR Interval:    QRS Duration: 118 QT Interval:  379 QTC Calculation: 494 R Axis:   74 Text Interpretation: Sinus tachycardia Incomplete right bundle branch block Improved QTC from ptior. No STEMI Confirmed by Antony Blackbird [phone]) on 01/05/2020 11:35:09 AM   Radiology No results found.  Procedures Procedures (including critical care  time)  Medications Ordered in ED Medications  LORazepam (ATIVAN) 2 MG/ML injection (has no administration in time range)  LORazepam (ATIVAN) injection 2 mg (2 mg Intravenous Not Given 01/05/20 0841)  lidocaine-EPINEPHrine-tetracaine (LET) topical gel (3 mLs Topical Given 01/05/20 0830)  LORazepam (ATIVAN) injection 2 mg (2 mg Intravenous Given 01/05/20 0841)  sodium chloride 0.9 % bolus 1,000 mL (0 mLs Intravenous Stopped 01/05/20 1014)  LORazepam (ATIVAN) injection 2 mg (2 mg Intravenous Given 01/05/20 1107)    ED Course  I have reviewed the triage vital signs and the nursing notes.  Pertinent labs & imaging results that were available during my care of the patient were reviewed by me and considered in my medical decision making (see chart for details).    MDM Rules/Calculators/A&P                      Gabriel [name] is a 44 y.o. male with a past medical history significant for well-controlled HIV, history of IV drug use, and prior abscesses who presents with suicide attempt, suicidal ideation, and drug use.  According to patient, he took meth yesterday and had no complications.  He reports he took a drug that he thought was meth overnight and that "pushed him over the edge".  He reports that he has been having suicidal ideation and finally decided to kill himself.  He used a razor blade and slit his left wrist numerous times as well as try to cut his neck.  He says that he is hearing voices and seeing hallucinations and he wants to kill himself.  He denies homicidal ideation.  He is talking to himself in the exam room.  He denies any other physical complaints leading up to his suicide attempt and says he has tried to kill himself in the past.  He would not elaborate at this time.  He denies any fevers, chills, chest, cough, shortness of breath, chest pain, urinary symptoms or GI symptoms.    On exam, patient has symmetric ulnar radial pulses.  Normal grip strength and sensation in hands.  Patient  has numerous lacerations to his left wrist some being very superficial and some being slightly deeper.  Normal cap refill in the fingers.  Patient has several horizontal superficial laceration/abrasions to the neck that do not appear deep or appear to need sutures at this time.  Lungs were clear and chest was nontender.  Abdomen was nontender.  Patient is seeming to respond to external stimuli and is still reporting suicidal ideation.  Given his suicidal ideation, and his suicide attempt, we will place him under IVC.  His symptoms may be provoked by this unknown drug use however, as he is still suicidal we will make sure he sees psychiatry.  Let will be placed on his wounds and they will be repaired as needed after being washed out.  If foreign bodies are seen, will consider x-ray.   Anticipate consulting TTS after labs and his wounds have been managed.  11:34 AM Patient's creatinine came back elevated, he will receive 1 L of fluids and will be rechecked for medical clearance purposes. His UDS does show cocaine and amphetamines. Covid test is negative and flu test is negative. Salicylate and Tylenol test negative. CBC reassuring. EKG shows improved QTC from prior and is now not prolonged.  Dosing creatinine is downtrending, he will be medically cleared for psychiatric management. TTS consult will be placed. He is now under IVC for suicide attempt.  His wounds were managed and repaired by Army Melia, PA-C. She placed staples, sutures, and glue on the wrist and glue on the neck wounds. They will be bandaged. He reports his tetanus is up-to-date. There is no foreign body seen and given lack of other bony tenderness and lack of deep wound seen, do not feel imaging will be needed at this time.  11:46 AM Creatinine has now downtrending to 1.3. We will continue with 1 more liter of fluid still to metabolize and he is now medically clear.  TTS consult will be placed.  3:14 PM Just read psychiatry's note  that they want to evaluate him in the morning and hold him overnight.  Holding orders placed.  Final Clinical Impression(s) / ED Diagnoses Final diagnoses:  Suicidal ideation  Suicide attempt (HCC)  Wrist laceration, left, initial encounter  Laceration of neck, initial encounter  Involuntary commitment     Clinical Impression: 1. Suicidal ideation   2. Suicide attempt (HCC)   3. Wrist laceration, left, initial encounter   4. Laceration of neck, initial encounter   5. Involuntary commitment     Disposition: Awaiting TTS recommendations as patient is under IVC and tried to commit suicide last night.  This note was prepared with assistance of Conservation officer, historic buildings. Occasional wrong-word or sound-a-like substitutions may have occurred due to the inherent limitations of voice recognition software.      Jenicka Coxe, Canary Brim, MD 01/05/20 [phone]

## 2020-01-05 NOTE — ED Notes (Signed)
TTS completed - Per Hillery Jacks, BH NP, order received for Haldol 5mg  BID po.

## 2020-01-05 NOTE — ED Notes (Signed)
Pt on phone at nurses' desk - voiced understanding of Medical Clearance Pt Policy - copy given.

## 2020-01-06 DIAGNOSIS — R45851 Suicidal ideations: Secondary | ICD-10-CM | POA: Diagnosis not present

## 2020-01-06 MED ORDER — ZOLPIDEM TARTRATE 5 MG PO TABS
10.0000 mg | ORAL_TABLET | Freq: Once | ORAL | Status: AC
  Administered 2020-01-06: 10 mg via ORAL
  Filled 2020-01-06: qty 2

## 2020-01-06 NOTE — Progress Notes (Signed)
Pt meets inpatient criteria per Hillery Jacks, NP. Referral information has been sent to the following hospitals for review:  [facility] Details [facility] Details  [facility] Details [facility] Details  [facility] Details [facility]  Disposition will continue to assist with inpatient placement needs.   Wells Guiles, LCSW, LCAS Disposition CSW [facility]/TTS [phone]

## 2020-01-06 NOTE — Consult Note (Signed)
Telepsych Consultation   Reason for Consult:  Suicidal Ideation after meth use Referring Physician:  EPD Location of Patient: 65[facility] Location of Provider: [facility]  Patient Identification: Gabriel [name] MRN:  [id number] Principal Diagnosis: <principal problem not specified> Diagnosis:  Active Problems:   * No active hospital problems. *   Total Time spent with patient: 15 minutes  Subjective:   Gabriel [name] is a 44 y.o. male was evaluated via teleassessment is awake, alert and oriented x3. He is currently denying suicidal or homicidal ideations. Denies auditory or visual hallucinations. Patient was evaluated on 01/05/2020 appears to be less restless during this assessment. Patient reports using cocaine and methamphetamines roughly about 3-4 times a year. States "this time just got out of hand" denies depression or depressive symptoms. " I feel ready to discharge I am concerned about my dogs who are home alone."  patient provided Mr. Whitley Poteet information for collateral information however no answer. [phone]. Patient is requesting for FMLA paperwork to be completed for this assessment. Patient was offered outpatient resources for substance abuse. Patient declined at this time. Patient was staffed during treatment team. Support,encouragement and reassurance was provided.  HPI:  Per admission assessment note: Gabriel [name] is a 44 y.o. male brought to [facility] due to a suicide attempt.  Pt states, "I tried to kill myself a lot.  I was trying to commit suicide but I was unsuccessful."  Pt's speech became disorganized and his behavior was erratic with constant movement.  Pt admits to polysubstance use. Pt states, "I drink a lot every day, it depends on what I have in the house.  I use cocaine but I like crystal meth.  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  Pt denies HI.  Past Psychiatric History:   Risk to Self:  Suicidal Ideation: Yes-Currently Present(ongoing for 2 y r) Suicidal Intent: Yes-Currently Present Is patient at risk for suicide?: Yes Suicidal Plan?: Yes-Currently Present Specify Current Suicidal Plan: Cut my wrist and slit my throat Access to Means: Yes Specify Access to Suicidal Means: sharp knives What has been your use of drugs/alcohol within the last 12 months?: meth, alcohol, cocaine Triggers for Past Attempts: Hallucinations Intentional Self Injurious Behavior: Cutting Risk to Others: Homicidal Ideation: No Thoughts of Harm to Others: No Current Homicidal Intent: No Current Homicidal Plan: No Access to Homicidal Means: No History of harm to others?: No Assessment of Violence: In distant past Does patient have access to weapons?: No Criminal Charges Pending?: No Does patient have a court date: No Prior Inpatient Therapy: Prior Inpatient Therapy: No Prior Outpatient Therapy: Prior Outpatient Therapy: No Does patient have an ACCT team?: No Does patient have Intensive In-House Services?  : No Does patient have [facility] services? : No Does patient have P4CC services?: No  Past Medical History:  Past Medical History:  Diagnosis Date  . Abscess    buttocks  . Cellulitis   . HIV positive ([place])   . MRSA infection     Past Surgical History:  Procedure Laterality Date  . HAND / FINGER LESION EXCISION     Family History:  Family History  Problem Relation Age of Onset  . Hypertension Mother   . Cancer Maternal Grandmother   . Crohn's disease Brother    Family Psychiatric  History:  Social History:  Social History   Substance and Sexual Activity  Alcohol Use Yes  . Alcohol/week: 3.0  standard drinks  . Types: 3 Standard drinks or equivalent per week   Comment: socially      Social History   Substance and Sexual Activity  Drug Use No  . Types: Cocaine, IV, Methamphetamines   Comment: only meth at this point    Social History   Socioeconomic History  .  Marital status: Single    Spouse name: Not on file  . Number of children: Not on file  . Years of education: Not on file  . Highest education level: Not on file  Occupational History  . Not on file  Tobacco Use  . Smoking status: Current Every Day Smoker    Packs/day: 1.00    Types: Cigarettes  . Smokeless tobacco: Never Used  Substance and Sexual Activity  . Alcohol use: Yes    Alcohol/week: 3.0 standard drinks    Types: 3 Standard drinks or equivalent per week    Comment: socially   . Drug use: No    Types: Cocaine, IV, Methamphetamines    Comment: only meth at this point  . Sexual activity: Not Currently    Partners: Male    Comment: declined condoms  Other Topics Concern  . Not on file  Social History Narrative   internet processor   Social Determinants of Health   Financial Resource Strain:   . Difficulty of Paying Living Expenses:   Food Insecurity:   . Worried About Charity fundraiser in the Last Year:   . Arboriculturist in the Last Year:   Transportation Needs:   . Film/video editor (Medical):   Marland Kitchen Lack of Transportation (Non-Medical):   Physical Activity:   . Days of Exercise per Week:   . Minutes of Exercise per Session:   Stress:   . Feeling of Stress :   Social Connections:   . Frequency of Communication with Friends and Family:   . Frequency of Social Gatherings with Friends and Family:   . Attends Religious Services:   . Active Member of Clubs or Organizations:   . Attends Archivist Meetings:   Marland Kitchen Marital Status:    Additional Social History:    Allergies:   Allergies  Allergen Reactions  . Abacavir Other (See Comments)    HLA [id number] positive - should not receive abacavir due to risk of hypersensitivity    Labs:  Results for orders placed or performed during the hospital encounter of 01/05/20 (from the past 48 hour(s))  Comprehensive metabolic panel     Status: Abnormal   Collection Time: 01/05/20  7:53 AM  Result Value Ref  Range   Sodium 137 135 - 145 mmol/L   Potassium 3.2 (L) 3.5 - 5.1 mmol/L   Chloride 99 98 - 111 mmol/L   CO2 24 22 - 32 mmol/L   Glucose, Bld 89 70 - 99 mg/dL    Comment: Glucose reference range applies only to samples taken after fasting for at least 8 hours.   BUN 21 (H) 6 - 20 mg/dL   Creatinine, Ser 1.79 (H) 0.61 - 1.24 mg/dL   Calcium 9.8 8.9 - 10.3 mg/dL   Total Protein 8.2 (H) 6.5 - 8.1 g/dL   Albumin 4.5 3.5 - 5.0 g/dL   AST 62 (H) 15 - 41 U/L   ALT 38 0 - 44 U/L   Alkaline Phosphatase 78 38 - 126 U/L   Total Bilirubin 1.3 (H) 0.3 - 1.2 mg/dL   GFR calc non Af Amer 45 (  L) >60 mL/min   GFR calc Af Amer 53 (L) >60 mL/min   Anion gap 14 5 - 15    Comment: Performed at [facility] [address] [place], [place] [address]  Ethanol     Status: None   Collection Time: 01/05/20  7:53 AM  Result Value Ref Range   Alcohol, Ethyl (B) <10 <10 mg/dL    Comment: (NOTE) Lowest detectable limit for serum alcohol is 10 mg/dL. For medical purposes only. Performed at [facility], [place] [address], [place], [place] [address]   Salicylate level     Status: Abnormal   Collection Time: 01/05/20  7:53 AM  Result Value Ref Range   Salicylate Lvl <2.6 (L) 7.0 - 30.0 mg/dL    Comment: Performed at [facility] [address] [place], [place] [address]  Acetaminophen level     Status: Abnormal   Collection Time: 01/05/20  7:53 AM  Result Value Ref Range   Acetaminophen (Tylenol), Serum <10 (L) 10 - 30 ug/mL    Comment: (NOTE) Therapeutic concentrations vary significantly. A range of 10-30 ug/mL  may be an effective concentration for many patients. However, some  are best treated at concentrations outside of this range. Acetaminophen concentrations >150 ug/mL at 4 hours after ingestion  and >50 ug/mL at 12 hours after ingestion are often associated with  toxic reactions. Performed at [facility], [place] [address] [place], [place] [address]   cbc     Status:  None   Collection Time: 01/05/20  7:53 AM  Result Value Ref Range   WBC 9.6 4.0 - 10.5 K/uL   RBC 4.66 4.22 - 5.81 MIL/uL   Hemoglobin 15.0 13.0 - 17.0 g/dL   HCT 45.3 39.0 - 52.0 %   MCV 97.2 80.0 - 100.0 fL   MCH 32.2 26.0 - 34.0 pg   MCHC 33.1 30.0 - 36.0 g/dL   RDW 13.9 11.5 - 15.5 %   Platelets 164 150 - 400 K/uL   nRBC 0.0 0.0 - 0.2 %    Comment: Performed at [facility], [place] [address] [place], [place] [address]  Rapid urine drug screen (hospital performed)     Status: Abnormal   Collection Time: 01/05/20  8:16 AM  Result Value Ref Range   Opiates NONE DETECTED NONE DETECTED   Cocaine POSITIVE (A) NONE DETECTED   Benzodiazepines NONE DETECTED NONE DETECTED   Amphetamines POSITIVE (A) NONE DETECTED   Tetrahydrocannabinol NONE DETECTED NONE DETECTED   Barbiturates NONE DETECTED NONE DETECTED    Comment: (NOTE) DRUG SCREEN FOR MEDICAL PURPOSES ONLY.  IF CONFIRMATION IS NEEDED FOR ANY PURPOSE, NOTIFY LAB WITHIN 5 DAYS. LOWEST DETECTABLE LIMITS FOR URINE DRUG SCREEN Drug Class                     Cutoff (ng/mL) Amphetamine and metabolites    1000 Barbiturate and metabolites    200 Benzodiazepine                 119 Tricyclics and metabolites     300 Opiates and metabolites        300 Cocaine and metabolites        300 THC                            50 Performed at [facility], [place] [address], [place], [place]  [address]   Respiratory Panel by RT PCR (Flu A&B, Covid) - Nasopharyngeal Swab     Status: None   Collection Time: 01/05/20  9:56 AM   Specimen: Nasopharyngeal Swab  Result Value Ref Range   SARS Coronavirus 2 by RT PCR NEGATIVE NEGATIVE    Comment: (NOTE) SARS-CoV-2 target nucleic acids are NOT DETECTED. The SARS-CoV-2 RNA is generally detectable in upper respiratoy specimens during the acute phase of infection. The lowest concentration of SARS-CoV-2 viral copies this assay can detect is 131 copies/mL. A negative result does not preclude  SARS-Cov-2 infection and should not be used as the sole basis for treatment or other patient management decisions. A negative result may occur with  improper specimen collection/handling, submission of specimen other than nasopharyngeal swab, presence of viral mutation(s) within the areas targeted by this assay, and inadequate number of viral copies (<131 copies/mL). A negative result must be combined with clinical observations, patient history, and epidemiological information. The expected result is Negative. Fact Sheet for Patients:  PinkCheek.be Fact Sheet for Healthcare Providers:  GravelBags.it This test is not yet ap proved or cleared by the [place] FDA and  has been authorized for detection and/or diagnosis of SARS-CoV-2 by FDA under an Emergency Use Authorization (EUA). This EUA will remain  in effect (meaning this test can be used) for the duration of the COVID-19 declaration under Section 564(b)(1) of the Act, 21 U.S.C. section 360bbb-3(b)(1), unless the authorization is terminated or revoked sooner.    Influenza A by PCR NEGATIVE NEGATIVE   Influenza B by PCR NEGATIVE NEGATIVE    Comment: (NOTE) The Xpert Xpress SARS-CoV-2/FLU/RSV assay is intended as an aid in  the diagnosis of influenza from Nasopharyngeal swab specimens and  should not be used as a sole basis for treatment. Nasal washings and  aspirates are unacceptable for Xpert Xpress SARS-CoV-2/FLU/RSV  testing. Fact Sheet for Patients: PinkCheek.be Fact Sheet for Healthcare Providers: GravelBags.it This test is not yet approved or cleared by the [place] FDA and  has been authorized for detection and/or diagnosis of SARS-CoV-2 by  FDA under an Emergency Use Authorization (EUA). This EUA will remain  in effect (meaning this test can be used) for the duration of the  Covid-19 declaration  under Section 564(b)(1) of the Act, 21  U.S.C. section 360bbb-3(b)(1), unless the authorization is  terminated or revoked. Performed at [facility], [place] [address], [place], [place] [address]   Comprehensive metabolic panel     Status: Abnormal   Collection Time: 01/05/20 10:58 AM  Result Value Ref Range   Sodium 135 135 - 145 mmol/L   Potassium 3.8 3.5 - 5.1 mmol/L   Chloride 102 98 - 111 mmol/L   CO2 22 22 - 32 mmol/L   Glucose, Bld 109 (H) 70 - 99 mg/dL    Comment: Glucose reference range applies only to samples taken after fasting for at least 8 hours.   BUN 18 6 - 20 mg/dL   Creatinine, Ser 1.34 (H) 0.61 - 1.24 mg/dL   Calcium 8.8 (L) 8.9 - 10.3 mg/dL   Total Protein 7.2 6.5 - 8.1 g/dL   Albumin 4.0 3.5 - 5.0 g/dL   AST 58 (H) 15 - 41 U/L   ALT 34 0 - 44 U/L   Alkaline Phosphatase 67 38 - 126 U/L   Total Bilirubin 1.2 0.3 - 1.2 mg/dL   GFR calc non Af Amer >60 >60 mL/min   GFR calc Af Amer >60 >60  mL/min   Anion gap 11 5 - 15    Comment: Performed at [facility] [address], [place], [place] [address]    Medications:  Current Facility-Administered Medications  Medication Dose Route Frequency Provider Last Rate Last Admin  . Darunavir-Cobicisctat-Emtricitabine-Tenofovir Alafenamide (SYMTUZA) 800-150-200-10 MG TABS 1 tablet  1 tablet Oral QHS Blanchie Dessert, MD   1 tablet at 01/05/20 2202  . fluticasone (FLONASE) 50 MCG/ACT nasal spray 2 spray  2 spray Each Nare BID PRN Blanchie Dessert, MD      . haloperidol (HALDOL) tablet 5 mg  5 mg Oral BID Tegeler, Gwenyth Allegra, MD   5 mg at 01/05/20 2202  . LORazepam (ATIVAN) injection 2 mg  2 mg Intravenous Once Tegeler, Gwenyth Allegra, MD      . nicotine (NICODERM CQ - dosed in mg/24 hours) patch 21 mg  21 mg Transdermal Daily Blanchie Dessert, MD       Current Outpatient Medications  Medication Sig Dispense Refill  . Darunavir-Cobicisctat-Emtricitabine-Tenofovir Alafenamide (SYMTUZA) 800-150-200-10 MG TABS  Take 1 tablet by mouth daily with breakfast. (Patient taking differently: Take 1 tablet by mouth at bedtime. ) 30 tablet 5  . fluticasone (FLONASE) 50 MCG/ACT nasal spray Place 2 sprays into both nostrils in the morning and at bedtime.     . nicotine (NICODERM CQ) 21 mg/24hr patch Place 1 patch (21 mg total) onto the skin daily. (Patient taking differently: Place 21 mg onto the skin See admin instructions. Apply one patch (21 mg) in the mornings on work days, remove at night) 28 patch 1  . fexofenadine-pseudoephedrine (ALLEGRA-D) 60-120 MG 12 hr tablet Take 1 tablet by mouth 2 (two) times daily. (Patient not taking: Reported on 12/20/2019) 20 tablet 0    Musculoskeletal:   Psychiatric Specialty Exam: Physical Exam  Vitals reviewed. Constitutional: He appears well-developed.  Neurological: He is alert.  Psychiatric: He has a normal mood and affect. His behavior is normal.    Review of Systems  Psychiatric/Behavioral: Negative for suicidal ideas.  All other systems reviewed and are negative.   Blood pressure 92/73, pulse 91, temperature 97.9 F (36.6 C), temperature source Oral, resp. rate 14, height '6\' 6"'  (1.981 m), weight 100.7 kg, SpO2 99 %.Body mass index is 25.65 kg/m.  General Appearance: Casual  Eye Contact:  Good  Speech:  Clear and Coherent  Volume:  Normal  Mood:  Anxious  Affect:  Appropriate  Thought Process:  Coherent  Orientation:  Full (Time, Place, and Person)  Thought Content:  Logical  Suicidal Thoughts:  No  Homicidal Thoughts:  No  Memory:  Immediate;   Fair Recent;   Fair  Judgement:  Fair  Insight:  Fair  Psychomotor Activity:  Normal  Concentration:  Concentration: Fair  Recall:  [employer] of Knowledge:  Fair  Language:  Fair  Akathisia:  No  Handed:  Right  AIMS (if indicated):     Assets:  Communication Skills Desire for Improvement Resilience Social Support  ADL's:  Intact  Cognition:  WNL  Sleep:      Np spoke to [employer] regarding  discharge disposition recommendation TTS to provide additional outpatient resources Peers support to follow-up with patient  Disposition: No evidence of imminent risk to self or others at present.   Patient does not meet criteria for psychiatric inpatient admission. Supportive therapy provided about ongoing stressors. Refer to IOP. Discussed crisis plan, support from social network, calling 911, coming to the Emergency Department, and calling Suicide  Hotline.  This service was provided via telemedicine using a 2-way, interactive audio and video technology.  Names of all persons participating in this telemedicine service and their role in this encounter. Name: Jamerius Boeckman Role: patient   Name: T.Malissie Musgrave Role: NP           Derrill Center, NP 01/06/2020 11:36 AM

## 2020-01-06 NOTE — ED Notes (Signed)
Pt requested his cell phone because he needed to call work. This Clinical research associate offered to look up the company number and he could call from desk phone. Pt reported the Company was not listed.

## 2020-01-06 NOTE — ED Provider Notes (Signed)
D/c home. No longer suicidal.  This was probably drug related.  IVC reversed.    Pricilla Loveless, MD 01/06/20 1128

## 2020-01-06 NOTE — ED Notes (Signed)
Patient called out stating he could not sleep. Dr. Elesa Massed was consulted. Ambien was prescribed and administered, patient is now comfortable.

## 2020-01-06 NOTE — ED Provider Notes (Signed)
9:43 AM reviewed patient information.  Patient presented yesterday with intentional self cutting and suicidal ideations.  Polysubstance abuse history.  Seen by psychiatry, plan to reevaluate in the morning.  Reviewed labs and vital signs.  History of HIV, home meds have been reordered previously.  BP 92/73   Pulse 91   Temp 97.9 F (36.6 C) (Oral)   Resp 14   Ht 6\' 6"  (1.981 m)   Wt 100.7 kg   SpO2 99%   BMI 25.65 kg/m   Plan: Currently awaiting consult by psychiatry.   , PA-C 01/06/20 0945    01/08/20, MD 01/08/20 1110

## 2020-01-06 NOTE — ED Notes (Signed)
Pt reported he wanted another shot . Pt  Informed the shot was ordered once at bed time.

## 2020-01-06 NOTE — ED Notes (Signed)
IVC-SI Attempt  bfast ordered

## 2020-01-06 NOTE — ED Notes (Signed)
TTS done 

## 2020-01-06 NOTE — Progress Notes (Signed)
Pt has been psych cleared. Outpatient resources faxed to [facility] ED for pt.   Wells Guiles, LCSW, LCAS Disposition CSW [facility]/TTS [phone]

## 2020-01-15 ENCOUNTER — Telehealth (HOSPITAL_COMMUNITY): Payer: Self-pay

## 2020-01-17 ENCOUNTER — Other Ambulatory Visit: Payer: Self-pay

## 2020-01-17 ENCOUNTER — Emergency Department (HOSPITAL_COMMUNITY)
Admission: EM | Admit: 2020-01-17 | Discharge: 2020-01-17 | Disposition: A | Discharge status: Home / Self Care | Payer: BC Managed Care – PPO | Attending: Emergency Medicine | Admitting: Emergency Medicine

## 2020-01-17 ENCOUNTER — Encounter (HOSPITAL_COMMUNITY): Payer: Self-pay

## 2020-01-17 DIAGNOSIS — S61512D Laceration without foreign body of left wrist, subsequent encounter: Secondary | ICD-10-CM | POA: Diagnosis not present

## 2020-01-17 DIAGNOSIS — Z4802 Encounter for removal of sutures: Secondary | ICD-10-CM | POA: Insufficient documentation

## 2020-01-17 DIAGNOSIS — Z79899 Other long term (current) drug therapy: Secondary | ICD-10-CM | POA: Insufficient documentation

## 2020-01-17 DIAGNOSIS — F17210 Nicotine dependence, cigarettes, uncomplicated: Secondary | ICD-10-CM | POA: Diagnosis not present

## 2020-01-17 MED ORDER — IBUPROFEN 600 MG PO TABS: 600.0000 mg | ORAL_TABLET | Freq: Four times a day (QID) | ORAL | 0 refills | Status: DC | PRN

## 2020-01-17 NOTE — ED Triage Notes (Signed)
Pt requesting suture removal from left wrist, stitches placed two weeks ago, denies signs of infection.

## 2020-01-17 NOTE — ED Provider Notes (Signed)
[name] Provider Note   CSN: [id number] Arrival date & time: 01/17/20  1019     History Chief Complaint  Patient presents with  . Suture / Staple Removal    Gabriel [name] is a 44 y.o. male w/ hx of self-inflicted laceration to his left wrist which was repaired by Suella Broad PA on 3/14, presenting back to the ED for suture and staple removal.  He has been keeping his wound clean and dressed at home.  No fevers at home.  He had 4 sutures and 2 staples placed on 3/14 in our ED.  HPI     Past Medical History:  Diagnosis Date  . Abscess    buttocks  . Cellulitis   . HIV positive ([place])   . MRSA infection     Patient Active Problem List   Diagnosis Date Noted  . Eustachian tube dysfunction 12/20/2019  . Bilateral lower extremity pain 12/20/2019  . Cough 04/30/2019  . Healthcare maintenance 01/18/2019  . Genital warts 06/27/2017  . IVDU (intravenous drug user) 06/27/2017  . Depression 06/27/2017  . Hematochezia 03/26/2015  . Dermatitis 06/10/2014  . Dyslipidemia 04/14/2011  . Blood in stool 03/20/2009  . 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 DENTAL CARIES 01/02/2009  . TOBACCO USER 05/27/2008  . INSOMNIA, CHRONIC 05/27/2008  . Human immunodeficiency virus (HIV) disease ([place]) 11/28/2006  . SYPHILIS NOS 11/28/2006    Past Surgical History:  Procedure Laterality Date  . HAND / FINGER LESION EXCISION         Family History  Problem Relation Age of Onset  . Hypertension Mother   . Cancer Maternal Grandmother   . Crohn's disease Brother     Social History   Tobacco Use  . Smoking status: Current Every Day Smoker    Packs/day: 1.00    Types: Cigarettes  . Smokeless tobacco: Never Used  Substance Use Topics  . Alcohol use: Yes    Alcohol/week: 3.0 standard drinks    Types: 3 Standard drinks or equivalent per week    Comment: socially   . Drug use: No    Types: Cocaine, IV, Methamphetamines     Comment: only meth at this point    Home Medications Prior to Admission medications   Medication Sig Start Date End Date Taking? Authorizing Provider  Darunavir-Cobicisctat-Emtricitabine-Tenofovir Alafenamide (SYMTUZA) 800-150-200-10 MG TABS Take 1 tablet by mouth daily with breakfast. Patient taking differently: Take 1 tablet by mouth at bedtime.  12/20/19  Yes Golden Circle, FNP  fluticasone (FLONASE) 50 MCG/ACT nasal spray Place 2 sprays into both nostrils in the morning and at bedtime.  12/13/19  Yes [provider]  nicotine (NICODERM CQ) 21 mg/24hr patch Place 1 patch (21 mg total) onto the skin daily. Patient taking differently: Place 21 mg onto the skin See admin instructions. Apply one patch (21 mg) in the mornings on work days, remove at night 12/20/19  Yes Calone, Ples Specter, FNP  fexofenadine-pseudoephedrine (ALLEGRA-D) 60-120 MG 12 hr tablet Take 1 tablet by mouth 2 (two) times daily. Patient not taking: Reported on 12/20/2019 11/27/19   Sable Feil, PA-C  ibuprofen (ADVIL) 600 MG tablet Take 1 tablet (600 mg total) by mouth every 6 (six) hours as needed for up to 40 doses for fever or mild pain. 01/17/20   Wyvonnia Dusky, MD    Allergies    Abacavir  Review of Systems   Review of Systems  Constitutional: Negative for  chills and fever.  Skin: Positive for rash and wound.    Physical Exam Updated Vital Signs BP 113/86 (BP Location: Right Arm)   Pulse 88   Temp 98.6 F (37 C) (Oral)   Resp 18   Ht 6\' 6"  (1.981 m)   Wt 99.3 kg   SpO2 100%   BMI 25.31 kg/m   Physical Exam Vitals and nursing note reviewed.  Constitutional:      Appearance: He is well-developed.  HENT:     Head: Normocephalic.     Comments: Superficial laceration to right lateral neck appear to be healing appropriately Eyes:     Conjunctiva/sclera: Conjunctivae normal.  Cardiovascular:     Rate and Rhythm: Normal rate and regular rhythm.     Pulses: Normal pulses.  Pulmonary:       Effort: Pulmonary effort is normal. No respiratory distress.     Breath sounds: Normal breath sounds.  Musculoskeletal:     Cervical back: Neck supple.  Skin:    General: Skin is warm and dry.     Comments: Significant improvement and healing of left wrist near multiple laceration sites, no purulent drainage, pink granulation tissue has formed over wounds 4 sutures identified, 2 staples identified  Neurological:     Mental Status: He is alert.  Psychiatric:        Mood and Affect: Mood normal.        Behavior: Behavior normal.     ED Results / Procedures / Treatments   Labs (all labs ordered are listed, but only abnormal results are displayed) Labs Reviewed - No data to display  EKG None  Radiology No results found.  Procedures .Suture Removal  Date/Time: 01/17/2020 11:47 AM Performed by: 01/19/2020, MD Authorized by: Terald Sleeper, MD   Consent:    Consent obtained:  Verbal   Consent given by:  Patient   Risks discussed:  Bleeding and pain Location:    Location:  Upper extremity   Upper extremity location:  Wrist   Wrist location:  L wrist Procedure details:    Wound appearance:  No signs of infection, nonpurulent, good wound healing, clean and pink   Number of sutures removed:  4   Number of staples removed:  2 Post-procedure details:    Post-removal:  No dressing applied   Patient tolerance of procedure:  Tolerated well, no immediate complications   (including critical care time)  Medications Ordered in ED Medications - No data to display  ED Course  I have reviewed the triage vital signs and the nursing notes.  Pertinent labs & imaging results that were available during my care of the patient were reviewed by me and considered in my medical decision making (see chart for details).  44 yo male here for suture/staple removal.  Successfully removed.  Wound appears to be healing well.  Advised he can leave it open at this point, does not need  to keep dressing it.  No evidence of infection.  Will discharge.   Final Clinical Impression(s) / ED Diagnoses Final diagnoses:  Visit for suture removal    Rx / DC Orders ED Discharge Orders         Ordered    ibuprofen (ADVIL) 600 MG tablet  Every 6 hours PRN     01/17/20 1116           01/19/20, MD 01/17/20 1149

## 2020-01-17 NOTE — Discharge Instructions (Signed)
You can keep your wound open to air at this point.  You can shower and bathe normally.  You can keep it lightly wrapped if you want to when you go outside, but do not need to.    I removed all 4 of the sutures and 2 staples that were placed by our staff.  The ER doctor who saw you during your admission was Dr Shari Prows, MD.  We do not generally fill out insurance or work-absentee paperwork from the ER.  You would need to talk to your primary care provider about this.

## 2020-01-20 ENCOUNTER — Encounter: Payer: Self-pay | Admitting: Family

## 2020-01-20 ENCOUNTER — Other Ambulatory Visit: Payer: Self-pay

## 2020-01-20 ENCOUNTER — Ambulatory Visit (INDEPENDENT_AMBULATORY_CARE_PROVIDER_SITE_OTHER): Payer: BC Managed Care – PPO | Admitting: Family

## 2020-01-20 VITALS — BP 109/74 | HR 60 | Temp 98.1°F | Ht 78.0 in | Wt 216.0 lb

## 2020-01-20 DIAGNOSIS — F17200 Nicotine dependence, unspecified, uncomplicated: Secondary | ICD-10-CM

## 2020-01-20 DIAGNOSIS — M79604 Pain in right leg: Secondary | ICD-10-CM | POA: Diagnosis not present

## 2020-01-20 DIAGNOSIS — M79605 Pain in left leg: Secondary | ICD-10-CM

## 2020-01-20 DIAGNOSIS — Z0000 Encounter for general adult medical examination without abnormal findings: Secondary | ICD-10-CM | POA: Diagnosis not present

## 2020-01-20 DIAGNOSIS — Z113 Encounter for screening for infections with a predominantly sexual mode of transmission: Secondary | ICD-10-CM

## 2020-01-20 DIAGNOSIS — B20 Human immunodeficiency virus [HIV] disease: Secondary | ICD-10-CM | POA: Diagnosis not present

## 2020-01-20 MED ORDER — DARUN-COBIC-EMTRICIT-TENOFAF 800-150-200-10 MG PO TABS: 1.0000 | ORAL_TABLET | Freq: Every day | ORAL | 5 refills | Status: DC

## 2020-01-20 NOTE — Patient Instructions (Signed)
Nice to see you.  Continue to take your Symtuza as prescribed daily.  Refills have bene sent to the pharmacy.  Continue follow up with Gabriel [name] on Thursday.  We will get you scheduled for a circulation test of your legs.  Plan for follow up in 3 months or sooner if needed.  Have a great day and stay safe!

## 2020-01-20 NOTE — Assessment & Plan Note (Signed)
Mr. Kia continues to have well-controlled HIV disease with good adherence and tolerance to his ART regimen of Symtuza.  No signs/symptoms of opportunistic infection or progressive HIV disease.  We reviewed his lab work and discussed the plan of care.  Continue current dose of Symtuza.  Plan for follow-up in 3 months or sooner if needed with lab work 1 to 2 weeks prior to appointment.

## 2020-01-20 NOTE — Assessment & Plan Note (Signed)
   Has received first dose of Covid vaccination and awaiting second.  Discussed importance of safe sexual practice to reduce risk of STI.  Condoms declined.

## 2020-01-20 NOTE — Assessment & Plan Note (Signed)
Gabriel [name] continues to have intermittent and waxing and waning bilateral lower extremity pain.  He does smoke which places him at risk for vascular disease.  We will check ABI for circulation.  Cannot rule out autoimmune or other process.  Symptoms are inconsistent with DVT. No treatment currently indicated will continue to monitor.

## 2020-01-20 NOTE — Progress Notes (Signed)
Subjective:    Patient ID: Gabriel [name], male    DOB: 03/04/1976, 44 y.o.   MRN: [id number]  Chief Complaint  Patient presents with  . HIV Positive/AIDS     HPI:  Gabriel [name] is a 44 y.o. male with HIV disease who was last seen in the office on 12/20/19 with good adherence and tolerance to his ART regimen of Symtuza.  Viral load at the time was undetectable with CD4 count of 353.  Most recent blood work completed on 12/20/2019 with a viral load that remains undetectable and CD4 count of 716.  Since his last office visit he was seen in the emergency department with thoughts of suicide having cut his wrist and neck.  He has an appointment scheduled for counseling coming up on Thursday.  Gabriel [name] continues to take his Symtuza as prescribed with no adverse side effects or missed doses since his last office visit.  Overall feeling well today with no new concerns/complaints. Denies fevers, chills, night sweats, headaches, changes in vision, neck pain/stiffness, nausea, diarrhea, vomiting, lesions or rashes.  Gabriel [name] has no problems obtaining his medication from the pharmacy and remains covered by [facility].  No feelings of being down, depressed, or hopeless and without suicidal ideation since leaving the hospital.  Wounds are healing nicely with no evidence of infection.  Not currently sexually active.  He was positive for cocaine and methamphetamines in the hospital.  Remains an every day smoker at a rate of about a pack a day.  Gabriel [name] does continue to have intermittent leg pain at various times throughout the day that generally wax and wane.   Allergies  Allergen Reactions  . Abacavir Other (See Comments)    HLA B5701 positive - should not receive abacavir due to risk of hypersensitivity      Outpatient Medications Prior to Visit  Medication Sig Dispense Refill  . ibuprofen (ADVIL) 600 MG tablet Take 1 tablet (600 mg total) by mouth every 6 (six) hours as needed for  up to 40 doses for fever or mild pain. 40 tablet 0  . Darunavir-Cobicisctat-Emtricitabine-Tenofovir Alafenamide (SYMTUZA) 800-150-200-10 MG TABS Take 1 tablet by mouth daily with breakfast. (Patient taking differently: Take 1 tablet by mouth at bedtime. ) 30 tablet 5  . fexofenadine-pseudoephedrine (ALLEGRA-D) 60-120 MG 12 hr tablet Take 1 tablet by mouth 2 (two) times daily. (Patient not taking: Reported on 12/20/2019) 20 tablet 0  . nicotine (NICODERM CQ) 21 mg/24hr patch Place 1 patch (21 mg total) onto the skin daily. (Patient not taking: Reported on 01/20/2020) 28 patch 1  . fluticasone (FLONASE) 50 MCG/ACT nasal spray Place 2 sprays into both nostrils in the morning and at bedtime.      No facility-administered medications prior to visit.     Past Medical History:  Diagnosis Date  . Abscess    buttocks  . Cellulitis   . HIV positive ([place])   . MRSA infection      Past Surgical History:  Procedure Laterality Date  . HAND / FINGER LESION EXCISION         Review of Systems  Constitutional: Negative for appetite change, chills, fatigue, fever and unexpected weight change.  Eyes: Negative for visual disturbance.  Respiratory: Negative for cough, chest tightness, shortness of breath and wheezing.   Cardiovascular: Negative for chest pain and leg swelling.  Gastrointestinal: Negative for abdominal pain, constipation, diarrhea, nausea and vomiting.  Genitourinary: Negative for dysuria, flank pain, frequency, genital  sores, hematuria and urgency.  Skin: Negative for rash.  Allergic/Immunologic: Negative for immunocompromised state.  Neurological: Negative for dizziness and headaches.      Objective:    BP 109/74   Pulse 60   Temp 98.1 F (36.7 C)   Ht _0  (1.981 m)   Wt 216 lb (98 kg)   SpO2 99%   BMI 24.96 kg/m  Nursing note and vital signs reviewed.  Physical Exam Constitutional:      General: He is not in acute distress.    Appearance: He is well-developed.    Eyes:     Conjunctiva/sclera: Conjunctivae normal.  Cardiovascular:     Rate and Rhythm: Normal rate and regular rhythm.     Heart sounds: Normal heart sounds. No murmur. No friction rub. No gallop.   Pulmonary:     Effort: Pulmonary effort is normal. No respiratory distress.     Breath sounds: Normal breath sounds. No wheezing or rales.  Chest:     Chest wall: No tenderness.  Abdominal:     General: Bowel sounds are normal.     Palpations: Abdomen is soft.     Tenderness: There is no abdominal tenderness.  Musculoskeletal:     Cervical back: Neck supple.  Lymphadenopathy:     Cervical: No cervical adenopathy.  Skin:    General: Skin is warm and dry.     Findings: No rash.     Comments: Left wrist wound appears to be healing with no evidence of infection.  Neurological:     Mental Status: He is alert and oriented to person, place, and time.  Psychiatric:        Behavior: Behavior normal.        Thought Content: Thought content normal.        Judgment: Judgment normal.      Depression screen [facility] 2/9 08/20/2019 10/15/2018 11/23/2017 06/27/2017 04/08/2015  Decreased Interest 0 0 0 0 0  Down, Depressed, Hopeless 0 0 0 0 0  PHQ - 2 Score 0 0 0 0 0       Assessment & Plan:    Patient Active Problem List   Diagnosis Date Noted  . Eustachian tube dysfunction 12/20/2019  . Bilateral lower extremity pain 12/20/2019  . Cough 04/30/2019  . Healthcare maintenance 01/18/2019  . Genital warts 06/27/2017  . IVDU (intravenous drug user) 06/27/2017  . Depression 06/27/2017  . Hematochezia 03/26/2015  . Dermatitis 06/10/2014  . Dyslipidemia 04/14/2011  . Blood in stool 03/20/2009  . 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 DENTAL CARIES 01/02/2009  . TOBACCO USER 05/27/2008  . INSOMNIA, CHRONIC 05/27/2008  . Human immunodeficiency virus (HIV) disease ([place]) 11/28/2006  . SYPHILIS NOS 11/28/2006     Problem List Items Addressed This Visit      Other    Human immunodeficiency virus (HIV) disease ([place])    Gabriel [name] continues to have well-controlled HIV disease with good adherence and tolerance to his ART regimen of Symtuza.  No signs/symptoms of opportunistic infection or progressive HIV disease.  We reviewed his lab work and discussed the plan of care.  Continue current dose of Symtuza.  Plan for follow-up in 3 months or sooner if needed with lab work 1 to 2 weeks prior to appointment.      Relevant Medications   Darunavir-Cobicisctat-Emtricitabine-Tenofovir Alafenamide (SYMTUZA) 800-150-200-10 MG TABS   Other Relevant Orders   HIV-1 RNA quant-no reflex-bld   T-helper cell (CD4)- ([facility] only)   Comprehensive  metabolic panel   TOBACCO USER   Relevant Orders   ABI   Healthcare maintenance     Has received first dose of Covid vaccination and awaiting second.  Discussed importance of safe sexual practice to reduce risk of STI.  Condoms declined.      Bilateral lower extremity pain - Primary    Gabriel [name] continues to have intermittent and waxing and waning bilateral lower extremity pain.  He does smoke which places him at risk for vascular disease.  We will check ABI for circulation.  Cannot rule out autoimmune or other process.  Symptoms are inconsistent with DVT. No treatment currently indicated will continue to monitor.       Relevant Orders   ABI    Other Visit Diagnoses    Screening for STDs (sexually transmitted diseases)       Relevant Orders   RPR       I have discontinued Lennette Bihari L. Ehlert's fluticasone. I am also having him maintain his fexofenadine-pseudoephedrine, nicotine, ibuprofen, and Darunavir-Cobicisctat-Emtricitabine-Tenofovir Alafenamide.   Meds ordered this encounter  Medications  . Darunavir-Cobicisctat-Emtricitabine-Tenofovir Alafenamide (SYMTUZA) 800-150-200-10 MG TABS    Sig: Take 1 tablet by mouth daily with breakfast.    Dispense:  30 tablet    Refill:  5    Order Specific Question:    Supervising Provider    Answer:   Carlyle Basques [[id number]]     Follow-up: Return in about 3 months (around 04/21/2020).   Terri Piedra, MSN, FNP-C Nurse Practitioner [facility] for [facility] [place] number: [phone]

## 2020-01-22 ENCOUNTER — Telehealth: Payer: Self-pay

## 2020-01-22 NOTE — Telephone Encounter (Signed)
New message  Patient is calling back to speak with [facility] about getting ABI scheduled. Transferred call to [facility]

## 2020-01-23 ENCOUNTER — Encounter: Payer: Self-pay | Admitting: Family

## 2020-01-23 ENCOUNTER — Telehealth: Payer: Self-pay | Admitting: *Deleted

## 2020-01-23 ENCOUNTER — Other Ambulatory Visit: Payer: Self-pay

## 2020-01-23 ENCOUNTER — Ambulatory Visit (INDEPENDENT_AMBULATORY_CARE_PROVIDER_SITE_OTHER): Payer: BC Managed Care – PPO | Admitting: Family

## 2020-01-23 VITALS — BP 118/74 | HR 86 | Ht 78.0 in | Wt 217.0 lb

## 2020-01-23 DIAGNOSIS — F3340 Major depressive disorder, recurrent, in remission, unspecified: Secondary | ICD-10-CM | POA: Diagnosis not present

## 2020-01-23 DIAGNOSIS — Z0289 Encounter for other administrative examinations: Secondary | ICD-10-CM | POA: Diagnosis not present

## 2020-01-23 NOTE — Patient Instructions (Addendum)
Nice to see you.  We will fax your paper work for you.  Continue to take your medication as prescribed.  Follow up with Marylu Lund as scheduled at 2:00  Let us know if you have any questions.   Have a great day and stay safe!

## 2020-01-23 NOTE — Assessment & Plan Note (Signed)
Recent visit to the ED for self-inflicted left wrist and right neck wounds and suicidal ideations likely substance related.  Currently without suicidal ideations or signs of psychosis.  Requesting return to work paperwork as well as FMLA for the time he was out.  Paperwork was completed and a copy faxed to his employer as well will be scanned in his chart.  Recommend continued treatment through counseling as scheduled.  No medication indicated at the present time.  Continue to monitor

## 2020-01-23 NOTE — Progress Notes (Signed)
Subjective:    Patient ID: Gabriel [name], male    DOB: Jul 21, 1976, 44 y.o.   MRN: [id number]  Chief Complaint  Patient presents with  . HIV Positive/AIDS    Pt came-requesting if can go back to work.     HPI:   Gabriel [name] is a 44 y.o. male with HIV disease who was recently seen in the office on 01/20/2020 returning today for an acute office visit.  Gabriel [name] was recently admitted to the ED on 01/05/2020 with suicidal ideations and was kept overnight until 01/06/2020 having sustained self-inflicted lacerations to his left wrist and right neck.  He has been out of work since that time.  Currently without suicidal ideations and no signs of depression.  Believes this episode was related to substance use resulting in exacerbation of symptoms.  Requesting FMLA paperwork to return back to work.  Scheduled for counseling in 1 week.   Allergies  Allergen Reactions  . Abacavir Other (See Comments)    HLA B5701 positive - should not receive abacavir due to risk of hypersensitivity      Outpatient Medications Prior to Visit  Medication Sig Dispense Refill  . Darunavir-Cobicisctat-Emtricitabine-Tenofovir Alafenamide (SYMTUZA) 800-150-200-10 MG TABS Take 1 tablet by mouth daily with breakfast. 30 tablet 5  . fexofenadine-pseudoephedrine (ALLEGRA-D) 60-120 MG 12 hr tablet Take 1 tablet by mouth 2 (two) times daily. 20 tablet 0  . ibuprofen (ADVIL) 600 MG tablet Take 1 tablet (600 mg total) by mouth every 6 (six) hours as needed for up to 40 doses for fever or mild pain. 40 tablet 0  . nicotine (NICODERM CQ) 21 mg/24hr patch Place 1 patch (21 mg total) onto the skin daily. 28 patch 1   No facility-administered medications prior to visit.     Past Medical History:  Diagnosis Date  . Abscess    buttocks  . Cellulitis   . HIV positive ([place])   . MRSA infection      Past Surgical History:  Procedure Laterality Date  . HAND / FINGER LESION EXCISION         Review of Systems   Constitutional: Negative for chills, diaphoresis, fatigue and fever.  Respiratory: Negative for cough, chest tightness, shortness of breath and wheezing.   Cardiovascular: Negative for chest pain.  Gastrointestinal: Negative for abdominal pain, diarrhea, nausea and vomiting.  Psychiatric/Behavioral: Negative for dysphoric mood, hallucinations, self-injury and sleep disturbance. The patient is not nervous/anxious and is not hyperactive.       Objective:    BP 118/74   Pulse 86   Ht _0  (1.981 m)   Wt 217 lb (98.4 kg)   SpO2 98%   BMI 25.08 kg/m  Nursing note and vital signs reviewed.  Physical Exam Constitutional:      General: He is not in acute distress.    Appearance: He is well-developed.  Cardiovascular:     Rate and Rhythm: Normal rate and regular rhythm.     Heart sounds: Normal heart sounds.  Pulmonary:     Effort: Pulmonary effort is normal.     Breath sounds: Normal breath sounds.  Skin:    General: Skin is warm and dry.  Neurological:     Mental Status: He is alert and oriented to person, place, and time.  Psychiatric:        Behavior: Behavior normal.        Thought Content: Thought content normal.        Judgment: Judgment normal.  Depression screen [facility] 2/9 08/20/2019 10/15/2018 11/23/2017 06/27/2017 04/08/2015  Decreased Interest 0 0 0 0 0  Down, Depressed, Hopeless 0 0 0 0 0  PHQ - 2 Score 0 0 0 0 0       Assessment & Plan:    Patient Active Problem List   Diagnosis Date Noted  . Eustachian tube dysfunction 12/20/2019  . Bilateral lower extremity pain 12/20/2019  . Cough 04/30/2019  . Healthcare maintenance 01/18/2019  . Genital warts 06/27/2017  . IVDU (intravenous drug user) 06/27/2017  . Depression 06/27/2017  . Hematochezia 03/26/2015  . Dermatitis 06/10/2014  . Dyslipidemia 04/14/2011  . Blood in stool 03/20/2009  . 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 DENTAL CARIES 01/02/2009  . TOBACCO USER 05/27/2008  .  INSOMNIA, CHRONIC 05/27/2008  . Human immunodeficiency virus (HIV) disease ([place]) 11/28/2006  . SYPHILIS NOS 11/28/2006     Problem List Items Addressed This Visit      Other   Depression - Primary    Recent visit to the ED for self-inflicted left wrist and right neck wounds and suicidal ideations likely substance related.  Currently without suicidal ideations or signs of psychosis.  Requesting return to work paperwork as well as FMLA for the time he was out.  Paperwork was completed and a copy faxed to his employer as well will be scanned in his chart.  Recommend continued treatment through counseling as scheduled.  No medication indicated at the present time.  Continue to monitor       Other Visit Diagnoses    Encounter for completion of form with patient           I am having Gabriel [name] maintain his fexofenadine-pseudoephedrine, nicotine, ibuprofen, and Darunavir-Cobicisctat-Emtricitabine-Tenofovir Alafenamide.   Follow-up: 1 week for counseling as planned and normal HIV follow-ups  Terri Piedra, MSN, FNP-C Nurse Practitioner [facility] for [place] number: [phone]

## 2020-01-23 NOTE — Telephone Encounter (Signed)
FMLA ([employer])--filled out by Marcos Eke and gave original copy to patient. Faxed to [facility] -[phone].  11:48am

## 2020-01-30 ENCOUNTER — Ambulatory Visit: Payer: BC Managed Care – PPO

## 2020-01-30 ENCOUNTER — Other Ambulatory Visit: Payer: Self-pay

## 2020-02-07 ENCOUNTER — Other Ambulatory Visit: Payer: Self-pay | Admitting: Family

## 2020-02-07 ENCOUNTER — Ambulatory Visit (HOSPITAL_COMMUNITY)
Admission: RE | Admit: 2020-02-07 | Discharge: 2020-02-07 | Disposition: A | Discharge status: Home / Self Care | Payer: BC Managed Care – PPO | Source: Ambulatory Visit | Attending: Cardiovascular Disease | Admitting: Cardiovascular Disease

## 2020-02-07 DIAGNOSIS — F17200 Nicotine dependence, unspecified, uncomplicated: Secondary | ICD-10-CM | POA: Insufficient documentation

## 2020-02-07 DIAGNOSIS — I739 Peripheral vascular disease, unspecified: Secondary | ICD-10-CM | POA: Diagnosis not present

## 2020-02-20 ENCOUNTER — Other Ambulatory Visit: Payer: Self-pay

## 2020-02-20 ENCOUNTER — Ambulatory Visit: Payer: BC Managed Care – PPO

## 2020-03-13 ENCOUNTER — Other Ambulatory Visit: Payer: Self-pay

## 2020-03-13 DIAGNOSIS — F17200 Nicotine dependence, unspecified, uncomplicated: Secondary | ICD-10-CM

## 2020-03-13 MED ORDER — NICOTINE 21 MG/24HR TD PT24: 21.0000 mg | MEDICATED_PATCH | Freq: Every day | TRANSDERMAL | 1 refills | Status: DC

## 2020-06-08 ENCOUNTER — Other Ambulatory Visit: Payer: Self-pay

## 2020-06-08 ENCOUNTER — Telehealth: Payer: Self-pay | Admitting: Emergency Medicine

## 2020-06-08 ENCOUNTER — Emergency Department
Admission: EM | Admit: 2020-06-08 | Discharge: 2020-06-08 | Disposition: A | Discharge status: Home / Self Care | Payer: BC Managed Care – PPO | Attending: Emergency Medicine | Admitting: Emergency Medicine

## 2020-06-08 ENCOUNTER — Other Ambulatory Visit: Payer: BC Managed Care – PPO

## 2020-06-08 ENCOUNTER — Emergency Department: Payer: BC Managed Care – PPO

## 2020-06-08 DIAGNOSIS — U071 COVID-19: Secondary | ICD-10-CM | POA: Diagnosis not present

## 2020-06-08 DIAGNOSIS — F17210 Nicotine dependence, cigarettes, uncomplicated: Secondary | ICD-10-CM | POA: Diagnosis not present

## 2020-06-08 DIAGNOSIS — R430 Anosmia: Secondary | ICD-10-CM | POA: Diagnosis not present

## 2020-06-08 DIAGNOSIS — R05 Cough: Secondary | ICD-10-CM | POA: Diagnosis not present

## 2020-06-08 DIAGNOSIS — Z20822 Contact with and (suspected) exposure to covid-19: Secondary | ICD-10-CM

## 2020-06-08 LAB — RESPIRATORY PANEL BY RT PCR (FLU A&B, COVID)
Influenza A by PCR: NEGATIVE
Influenza B by PCR: NEGATIVE
SARS Coronavirus 2 by RT PCR: POSITIVE — AB

## 2020-06-08 MED ORDER — PREDNISONE 10 MG PO TABS: ORAL_TABLET | ORAL | 0 refills | Status: AC

## 2020-06-08 MED ORDER — AZITHROMYCIN 250 MG PO TABS: ORAL_TABLET | ORAL | 0 refills | Status: AC

## 2020-06-08 NOTE — Telephone Encounter (Signed)
Patient called me back and cannot see t he result of covid in his mychart. I gave him result and it looks like it is scheduled to release.  We also discussed cdc guidelines for isolation, contacts.

## 2020-06-08 NOTE — ED Provider Notes (Signed)
[facility] Emergency Department Provider Note   ____________________________________________   First MD Initiated Contact with Patient 06/08/20 1009     (approximate)  I have reviewed the triage vital signs and the nursing notes.   HISTORY  Chief Complaint loss of smell and Cough   HPI Gabriel [name] is a 44 y.o. male who presents to the emergency department for evaluation of loss of smell and cough.  The patient's symptoms began on 06/05/2020.  Denies increased work of breathing, denies shortness of breath, denies chest pain.  No known sick contacts.  Patient has been fully vaccinated with two-step vaccine since April.  Of note, the patient is HIV positive, and reports that he takes antiretrovirals and is currently undetectable.      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 Authorizing Provider  azithromycin (ZITHROMAX Z-PAK) 250 MG tablet Take 2 tablets (500 mg) on  Day 1,  followed by 1 tablet (250 mg) once daily on Days 2 through 5. 06/08/20 06/13/20  Lucy Chris, PA  Darunavir-Cobicisctat-Emtricitabine-Tenofovir Alafenamide [facility]) 800-150-200-10 MG TABS Take 1 tablet by mouth daily with breakfast. 01/20/20   Veryl Speak, FNP  fexofenadine-pseudoephedrine (ALLEGRA-D) 60-120 MG 12 hr tablet Take 1 tablet by mouth 2 (two) times daily. 11/27/19   Joni Reining, PA-C  ibuprofen (ADVIL) 600 MG tablet Take 1 tablet (600 mg total) by mouth every 6 (six) hours as needed for up to 40 doses for fever or mild pain. 01/17/20   Terald Sleeper, MD  nicotine (NICODERM CQ) 21 mg/24hr patch Place 1 patch (21 mg total) onto the skin daily. 03/13/20   Veryl Speak, FNP  predniSONE (DELTASONE) 10 MG tablet Take 6 tablets (60 mg total) by mouth daily for 1 day, THEN 5 tablets (50 mg total) daily for 1 day, THEN 4 tablets (40 mg total) daily for 1 day, THEN 3 tablets (30 mg total) daily for 1 day, THEN 2 tablets (20 mg total) daily for 1 day, THEN 1 tablet (10 mg total) daily for 1 day. 06/08/20 06/14/20  Lucy Chris, PA    Allergies Abacavir  Family History  Problem Relation Age of Onset   Hypertension Mother    Cancer Maternal Grandmother    Crohn's disease Brother     Social History Social History   Tobacco Use   Smoking status: Current Every Day Smoker    Packs/day: 1.00    Types: Cigarettes   Smokeless tobacco: Never Used  Vaping Use   Vaping Use: Never used  Substance Use Topics   Alcohol use: Yes    Alcohol/week: 3.0 standard drinks    Types: 3 Standard drinks or equivalent per week    Comment: socially    Drug use: No    Types: Cocaine, IV, Methamphetamines    Comment: only meth at this point    Review of Systems  Constitutional: No fever/chills Eyes: No visual changes. ENT: No sore throat. + Loss of smell Cardiovascular: Denies chest pain. Respiratory: + cough, Denies shortness of breath. Gastrointestinal: No abdominal pain.  No nausea, no vomiting.  No diarrhea.  No constipation. Genitourinary: Negative  for dysuria. Musculoskeletal: Negative for back pain. Skin: Negative for rash. Neurological: Negative for headaches, focal weakness or numbness.   ____________________________________________   PHYSICAL EXAM:  VITAL SIGNS: ED Triage Vitals  Enc Vitals Group     BP 06/08/20 0805 117/87     Pulse Rate 06/08/20 0805 80     Resp 06/08/20 0805 17     Temp 06/08/20 0805 97.7 F (36.5 C)     Temp Source 06/08/20 0805 Oral     SpO2 06/08/20 0805 98 %     Weight 06/08/20 0807 222 lb (100.7 kg)     Height 06/08/20 0807 6\' 6"  (1.981 m)     Head Circumference --      Peak Flow --      Pain Score 06/08/20 0818 0     Pain Loc --      Pain Edu? --      Excl. in GC? --     Constitutional: Alert and oriented. Well appearing and in no acute distress. Eyes: Conjunctivae are normal. PERRL. EOMI. Head: Atraumatic. Nose: No congestion/rhinnorhea. Mouth/Throat: Mucous membranes are moist.  Oropharynx non-erythematous. Neck: No stridor.   Cardiovascular: Normal rate, regular rhythm. Grossly normal heart sounds.  Good peripheral circulation. Respiratory: Normal respiratory effort.  No retractions. Lungs CTAB. Musculoskeletal: No lower extremity tenderness nor edema.  No joint effusions. Neurologic:  Normal speech and language. No gross focal neurologic deficits are appreciated. No gait instability. Skin:  Skin is warm, dry and intact. No rash noted. Psychiatric: Mood and affect are normal. Speech and behavior are normal.  ____________________________________________   LABS (all labs ordered are listed, but only abnormal results are displayed)  Labs Reviewed  RESPIRATORY PANEL BY RT PCR (FLU A&B, COVID) - Abnormal; Notable for the following components:      Result Value   SARS Coronavirus 2 by RT PCR POSITIVE (*)    All other components within normal limits    ____________________________________________  RADIOLOGY   Official radiology report(s): DG Chest 2 View  Result Date:  06/08/2020 CLINICAL DATA:  Cough, loss of smell EXAM: CHEST - 2 VIEW COMPARISON:  11/27/2019 FINDINGS: The heart size and mediastinal contours are within normal limits. Probable bilateral nipple shadows. Lungs are otherwise clear. No pleural effusion or pneumothorax. The visualized skeletal structures are unremarkable. A metallic safety pin projects over the midline of the neck on frontal view. IMPRESSION: 1. No acute cardiopulmonary disease. 2. Probable bilateral nipple shadows. A repeat radiograph with nipple markers could be performed to confirm. 3. A metallic safety pin projects over the midline of the neck on frontal view. Correlate with physical exam. Electronically Signed   By: 01/25/2020 D.O.   On: 06/08/2020 10:55     ____________________________________________   INITIAL IMPRESSION / ASSESSMENT AND PLAN / ED COURSE  As part of my medical decision making, I reviewed the following data within  the electronic MEDICAL RECORD NUMBER Nursing notes reviewed and incorporated and Old chart reviewed        Gabriel [name] is a 44 year old male presents to the emergency room for evaluation of cough with loss of smell x4 days.  The patient has not experienced any lethargy, shortness of breath, increased work of breathing, body aches, or fever/chills.  Patient is vaccinated.  Chest x-ray was performed secondary to symptoms with HIV positive status.  Chest x-ray is reassuring for no focal pneumonias.  Patient was prescribed a Z-Pak and prednisone which he will take if his Covid test returns positive.  Patient was cautioned to return should he experience any chest pain, increased work of breathing, shortness of breath.  Patient was also advised he should remain out of work for a total of 10 days since start of symptoms.  Gabriel [name] was evaluated in Emergency Department on 06/08/2020 for the symptoms described in the history of present illness. He was evaluated in the context of the global COVID-19  pandemic, which necessitated consideration that the patient might be at risk for infection with the SARS-CoV-2 virus that causes COVID-19. Institutional protocols and algorithms that pertain to the evaluation of patients at risk for COVID-19 are in a state of rapid change based on information released by regulatory bodies including the CDC and federal and state organizations. These policies and algorithms were followed during the patient's care in the ED.       ____________________________________________   FINAL CLINICAL IMPRESSION(S) / ED DIAGNOSES  Final diagnoses:  Suspected COVID-19 virus infection     ED Discharge Orders         Ordered    azithromycin (ZITHROMAX Z-PAK) 250 MG tablet     Discontinue  Reprint     06/08/20 1123    predniSONE (DELTASONE) 10 MG tablet     Discontinue  Reprint     06/08/20 1123           Note:  This document was prepared using Dragon voice recognition software and may include unintentional dictation errors.    Lucy Chris, PA 06/08/20 1709    Sharman Cheek, MD 06/09/20 [phone]

## 2020-06-08 NOTE — ED Notes (Signed)
See triage note  Presents with cough and loss to smell with some SOB  Afebrile on arrival

## 2020-06-08 NOTE — ED Triage Notes (Signed)
Pt comes via POV from home with c/o loss of smell an cough. Pt states he was at a wedding this weekend when this started.

## 2020-06-08 NOTE — Telephone Encounter (Signed)
Called patient to assure he got his covid positive result.  I left a message.

## 2020-06-09 ENCOUNTER — Encounter: Payer: Self-pay | Admitting: Oncology

## 2020-06-09 ENCOUNTER — Telehealth: Payer: Self-pay | Admitting: Adult Health

## 2020-06-09 ENCOUNTER — Other Ambulatory Visit: Payer: Self-pay | Admitting: Oncology

## 2020-06-09 ENCOUNTER — Telehealth: Payer: Self-pay | Admitting: Oncology

## 2020-06-09 DIAGNOSIS — U071 COVID-19: Secondary | ICD-10-CM

## 2020-06-09 NOTE — Telephone Encounter (Signed)
Called to discuss with Gabriel [name] about Covid symptoms and the use of bamlanivimab, a monoclonal antibody infusion for those with mild to moderate Covid symptoms and at a high risk of hospitalization.     Pt is qualified for this infusion at the [facility] infusion center due to co-morbid conditions and/or a member of an at-risk group, however declines infusion at this time. Symptoms tier reviewed as well as criteria for ending isolation.  Symptoms reviewed that would warrant ED/Hospital evaluation. Preventative practices reviewed. Patient verbalized understanding. Patient advised to call back if he decides that he does want to get infusion. Callback number to the infusion center given. Patient advised to go to Urgent care or ED with severe symptoms. Last date he would be eligible for infusion is 8/21.    Patient Active Problem List   Diagnosis Date Noted  . Eustachian tube dysfunction 12/20/2019  . Bilateral lower extremity pain 12/20/2019  . Cough 04/30/2019  . Healthcare maintenance 01/18/2019  . Genital warts 06/27/2017  . IVDU (intravenous drug user) 06/27/2017  . Depression 06/27/2017  . Hematochezia 03/26/2015  . Dermatitis 06/10/2014  . Dyslipidemia 04/14/2011  . Blood in stool 03/20/2009  . 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 DENTAL CARIES 01/02/2009  . TOBACCO USER 05/27/2008  . INSOMNIA, CHRONIC 05/27/2008  . Human immunodeficiency virus (HIV) disease (HCC) 11/28/2006  . SYPHILIS NOS 11/28/2006   Lillard Anes, NP

## 2020-06-09 NOTE — Telephone Encounter (Signed)
I connected by phone with  Gabriel [name] on 06/09/20 at 7:00pm to discuss the potential use of an new treatment for mild to moderate COVID-19 viral infection in non-hospitalized patients.   This patient is a age/sex that meets the FDA criteria for Emergency Use Authorization of casirivimab\imdevimab.  Has a (+) direct SARS-CoV-2 viral test result 1. Has mild or moderate COVID-19  2. Is ? 44 years of age and weighs ? 40 kg 3. Is NOT hospitalized due to COVID-19 4. Is NOT requiring oxygen therapy or requiring an increase in baseline oxygen flow rate due to COVID-19 5. Is within 10 days of symptom onset 6. Has at least one of the high risk factor(s) for progression to severe COVID-19 and/or hospitalization as defined in EUA. ? Specific high risk criteria : immunocompromised   Symptom onset  06/03/2020.    I have spoken and communicated the following to the patient or parent/caregiver:   1. FDA has authorized the emergency use of casirivimab\imdevimab for the treatment of mild to moderate COVID-19 in adults and pediatric patients with positive results of direct SARS-CoV-2 viral testing who are 42 years of age and older weighing at least 40 kg, and who are at high risk for progressing to severe COVID-19 and/or hospitalization.   2. The significant known and potential risks and benefits of casirivimab\imdevimab, and the extent to which such potential risks and benefits are unknown.   3. Information on available alternative treatments and the risks and benefits of those alternatives, including clinical trials.   4. Patients treated with casirivimab\imdevimab should continue to self-isolate and use infection control measures (e.g., wear mask, isolate, social distance, avoid sharing personal items, clean and disinfect "high touch" surfaces, and frequent handwashing) according to CDC guidelines.    5. The patient or parent/caregiver has the option to accept or refuse casirivimab\imdevimab .   After  reviewing this information with the patient, The patient agreed to proceed with receiving casirivimab\imdevimab infusion and will be provided a copy of the Fact sheet prior to receiving the infusion.Mignon Pine, AGNP-C [phone] (Infusion Center Hotline)

## 2020-06-11 ENCOUNTER — Ambulatory Visit (HOSPITAL_COMMUNITY)
Admission: RE | Admit: 2020-06-11 | Discharge: 2020-06-11 | Disposition: A | Discharge status: Home / Self Care | Payer: BC Managed Care – PPO | Source: Ambulatory Visit | Attending: Pulmonary Disease | Admitting: Pulmonary Disease

## 2020-06-11 DIAGNOSIS — U071 COVID-19: Secondary | ICD-10-CM

## 2020-06-11 MED ORDER — SODIUM CHLORIDE 0.9 % IV SOLN
1200.0000 mg | Freq: Once | INTRAVENOUS | Status: AC
  Administered 2020-06-11: 1200 mg via INTRAVENOUS
  Filled 2020-06-11: qty 10

## 2020-06-11 MED ORDER — FAMOTIDINE IN NACL 20-0.9 MG/50ML-% IV SOLN: 20.0000 mg | Freq: Once | INTRAVENOUS | Status: DC | PRN

## 2020-06-11 MED ORDER — SODIUM CHLORIDE 0.9 % IV SOLN: INTRAVENOUS | Status: DC | PRN

## 2020-06-11 MED ORDER — METHYLPREDNISOLONE SODIUM SUCC 125 MG IJ SOLR: 125.0000 mg | Freq: Once | INTRAMUSCULAR | Status: DC | PRN

## 2020-06-11 MED ORDER — EPINEPHRINE 0.3 MG/0.3ML IJ SOAJ: 0.3000 mg | Freq: Once | INTRAMUSCULAR | Status: DC | PRN

## 2020-06-11 MED ORDER — DIPHENHYDRAMINE HCL 50 MG/ML IJ SOLN: 50.0000 mg | Freq: Once | INTRAMUSCULAR | Status: DC | PRN

## 2020-06-11 MED ORDER — ALBUTEROL SULFATE HFA 108 (90 BASE) MCG/ACT IN AERS: 2.0000 | INHALATION_SPRAY | Freq: Once | RESPIRATORY_TRACT | Status: DC | PRN

## 2020-06-11 NOTE — Discharge Instructions (Signed)

## 2020-06-11 NOTE — Progress Notes (Signed)
  Diagnosis: COVID-19  Physician:Dr Wright  Procedure: Covid Infusion Clinic Med: casirivimab\imdevimab infusion - Provided patient with casirivimab\imdevimab fact sheet for patients, parents and caregivers prior to infusion.  Complications: No immediate complications noted.  Discharge: Discharged home   Gabriel [name] 06/11/2020  

## 2020-06-16 DIAGNOSIS — Z03818 Encounter for observation for suspected exposure to other biological agents ruled out: Secondary | ICD-10-CM | POA: Diagnosis not present

## 2020-06-16 DIAGNOSIS — Z20822 Contact with and (suspected) exposure to covid-19: Secondary | ICD-10-CM | POA: Diagnosis not present

## 2020-07-01 ENCOUNTER — Other Ambulatory Visit: Payer: Self-pay

## 2020-07-01 ENCOUNTER — Other Ambulatory Visit: Payer: BC Managed Care – PPO

## 2020-07-01 DIAGNOSIS — Z20822 Contact with and (suspected) exposure to covid-19: Secondary | ICD-10-CM | POA: Diagnosis not present

## 2020-07-03 LAB — NOVEL CORONAVIRUS, NAA: SARS-CoV-2, NAA: NOT DETECTED

## 2020-07-03 LAB — SARS-COV-2, NAA 2 DAY TAT

## 2020-07-06 ENCOUNTER — Other Ambulatory Visit: Payer: Self-pay

## 2020-07-06 ENCOUNTER — Encounter: Payer: Self-pay | Admitting: Family

## 2020-07-06 ENCOUNTER — Ambulatory Visit (INDEPENDENT_AMBULATORY_CARE_PROVIDER_SITE_OTHER): Payer: BC Managed Care – PPO | Admitting: Family

## 2020-07-06 VITALS — BP 159/81 | HR 96 | Temp 97.9°F | Wt 224.0 lb

## 2020-07-06 DIAGNOSIS — Z113 Encounter for screening for infections with a predominantly sexual mode of transmission: Secondary | ICD-10-CM | POA: Diagnosis not present

## 2020-07-06 DIAGNOSIS — B20 Human immunodeficiency virus [HIV] disease: Secondary | ICD-10-CM

## 2020-07-06 DIAGNOSIS — R05 Cough: Secondary | ICD-10-CM

## 2020-07-06 DIAGNOSIS — Z0000 Encounter for general adult medical examination without abnormal findings: Secondary | ICD-10-CM

## 2020-07-06 DIAGNOSIS — Z79899 Other long term (current) drug therapy: Secondary | ICD-10-CM | POA: Diagnosis not present

## 2020-07-06 DIAGNOSIS — R059 Cough, unspecified: Secondary | ICD-10-CM

## 2020-07-06 DIAGNOSIS — F17200 Nicotine dependence, unspecified, uncomplicated: Secondary | ICD-10-CM

## 2020-07-06 MED ORDER — DARUN-COBIC-EMTRICIT-TENOFAF 800-150-200-10 MG PO TABS: 1.0000 | ORAL_TABLET | Freq: Every day | ORAL | 5 refills | Status: DC

## 2020-07-06 MED ORDER — NICOTINE 21 MG/24HR TD PT24: 21.0000 mg | MEDICATED_PATCH | Freq: Every day | TRANSDERMAL | 1 refills | Status: DC

## 2020-07-06 NOTE — Progress Notes (Signed)
Subjective:    Patient ID: Gabriel [name], male    DOB: 1976/05/27, 44 y.o.   MRN: [id number]  Chief Complaint  Patient presents with  . Follow-up    B20     HPI:  Gabriel [name] is a 44 y.o. male with HIV disease who was last seen in the office on 01/23/2020 following an episode of recurrent major depression with good adherence and tolerance to his ART regimen of Symtuza.  Viral load at the time was found to be undetectable with CD4 count of 716.  No recent blood work completed.  In the interim has been diagnosed with COVID-19 and treated with monoclonal antibodies.  Here today for routine follow-up.  Continues to take his Symtuza as prescribed daily with no adverse side effects or missed doses. Overall feeling okay today but continues to have a cough associated with COVID that limits ability to speak at times.  Denies fevers, chills, night sweats, headaches, changes in vision, neck pain/stiffness, nausea, diarrhea, vomiting, lesions or rashes.  Gabriel [name] has no problems obtaining his medication from the pharmacy and remains covered through [facility].  No feelings of being down, depressed, or hopeless recently.  No current recreational or illicit drug use and continues to smoke about 1 pack of cigarettes per day on average and drinks alcohol socially.  Due for routine dental care.   Allergies  Allergen Reactions  . Abacavir Other (See Comments)    HLA [id number] positive - should not receive abacavir due to risk of hypersensitivity      Outpatient Medications Prior to Visit  Medication Sig Dispense Refill  . fexofenadine-pseudoephedrine (ALLEGRA-D) 60-120 MG 12 hr tablet Take 1 tablet by mouth 2 (two) times daily. 20 tablet 0  . ibuprofen (ADVIL) 600 MG tablet Take 1 tablet (600 mg total) by mouth every 6 (six) hours as needed for up to 40 doses for fever or mild pain. 40 tablet 0  . Darunavir-Cobicisctat-Emtricitabine-Tenofovir Alafenamide (SYMTUZA) 800-150-200-10 MG TABS  Take 1 tablet by mouth daily with breakfast. 30 tablet 5  . nicotine (NICODERM CQ) 21 mg/24hr patch Place 1 patch (21 mg total) onto the skin daily. 28 patch 1   No facility-administered medications prior to visit.     Past Medical History:  Diagnosis Date  . Abscess    buttocks  . Cellulitis   . HIV positive ([place])   . MRSA infection      Past Surgical History:  Procedure Laterality Date  . HAND / FINGER LESION EXCISION         Review of Systems  Constitutional: Negative for appetite change, chills, fatigue, fever and unexpected weight change.  Eyes: Negative for visual disturbance.  Respiratory: Positive for cough and shortness of breath. Negative for chest tightness and wheezing.   Cardiovascular: Negative for chest pain and leg swelling.  Gastrointestinal: Negative for abdominal pain, constipation, diarrhea, nausea and vomiting.  Genitourinary: Negative for dysuria, flank pain, frequency, genital sores, hematuria and urgency.  Skin: Negative for rash.  Allergic/Immunologic: Negative for immunocompromised state.  Neurological: Negative for dizziness and headaches.      Objective:    BP (!) 159/81   Pulse 96   Temp 97.9 F (36.6 C) (Oral)   Wt 224 lb (101.6 kg)   BMI 25.89 kg/m  Nursing note and vital signs reviewed.  Physical Exam Constitutional:      General: He is not in acute distress.    Appearance: He is well-developed.  Cardiovascular:  Rate and Rhythm: Normal rate and regular rhythm.     Heart sounds: Normal heart sounds.  Pulmonary:     Effort: Pulmonary effort is normal.     Breath sounds: Normal breath sounds.  Skin:    General: Skin is warm and dry.  Neurological:     Mental Status: He is alert and oriented to person, place, and time.  Psychiatric:        Behavior: Behavior normal.        Thought Content: Thought content normal.        Judgment: Judgment normal.      Depression screen [facility] 2/9 07/06/2020 08/20/2019 10/15/2018  11/23/2017 06/27/2017  Decreased Interest 0 0 0 0 0  Down, Depressed, Hopeless 0 0 0 0 0  PHQ - 2 Score 0 0 0 0 0       Assessment & Plan:    Patient Active Problem List   Diagnosis Date Noted  . Eustachian tube dysfunction 12/20/2019  . Bilateral lower extremity pain 12/20/2019  . Cough 04/30/2019  . Healthcare maintenance 01/18/2019  . Genital warts 06/27/2017  . IVDU (intravenous drug user) 06/27/2017  . Depression 06/27/2017  . Hematochezia 03/26/2015  . Dermatitis 06/10/2014  . Dyslipidemia 04/14/2011  . Blood in stool 03/20/2009  . 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 DENTAL CARIES 01/02/2009  . TOBACCO USER 05/27/2008  . INSOMNIA, CHRONIC 05/27/2008  . Human immunodeficiency virus (HIV) disease ([place]) 11/28/2006  . SYPHILIS NOS 11/28/2006     Problem List Items Addressed This Visit      Other   Human immunodeficiency virus (HIV) disease ([place]) - Primary    Gabriel [name] appears to have well-controlled HIV disease with good adherence and tolerance to his ART regimen of Symtuza.  No signs/symptoms of opportunistic infection or progressive HIV disease.  We reviewed previous lab work and discussed plan of care.  Continue current dose of Symtuza.  Check blood work today.  Plan for follow-up in 3 months or sooner if needed.      Relevant Medications   Darunavir-Cobicisctat-Emtricitabine-Tenofovir Alafenamide (SYMTUZA) 800-150-200-10 MG TABS   Other Relevant Orders   HIV-1 RNA quant-no reflex-bld   COMPLETE METABOLIC PANEL WITH GFR   CBC   T-helper cell (CD4)- ([facility] clinic only)   TOBACCO USER    Gabriel [name] would like to take this opportunity to quit smoking and would like refill of nicotine patches.  Discussed use of patches, gum, inhalers, and medications.  Previously given support line as needed for assistance.  Continue to monitor.      Relevant Medications   nicotine (NICODERM CQ) 21 mg/24hr patch   Healthcare maintenance     Declines influenza  vaccination today.  Discussed importance of safe sexual practice to reduce risk of STI.  Condoms declined.      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  He is improving and would anticipate improvements over the upcoming week and may return to work on 07/20/2020 without restrictions.  Paperwork completed reflecting this.  In the meantime continue over-the-counter medications as needed for symptom relief and supportive care.       Other Visit Diagnoses    Screening for STDs (sexually transmitted diseases)       Relevant Orders   RPR   Pharmacologic therapy       Relevant Orders   Lipid panel  I am having Gabriel [name] maintain his fexofenadine-pseudoephedrine, ibuprofen, nicotine, and Darunavir-Cobicisctat-Emtricitabine-Tenofovir Alafenamide.   Meds ordered this encounter  Medications  . nicotine (NICODERM CQ) 21 mg/24hr patch    Sig: Place 1 patch (21 mg total) onto the skin daily.    Dispense:  28 patch    Refill:  1    Order Specific Question:   Supervising Provider    Answer:   Carlyle Basques [[id number]]  . Darunavir-Cobicisctat-Emtricitabine-Tenofovir Alafenamide (SYMTUZA) 800-150-200-10 MG TABS    Sig: Take 1 tablet by mouth daily with breakfast.    Dispense:  30 tablet    Refill:  5    Order Specific Question:   Supervising Provider    Answer:   Carlyle Basques [[id number]]     Follow-up: Return in about 4 months (around 11/05/2020).   Terri Piedra, MSN, FNP-C Nurse Practitioner [facility] for [facility] [place] number: [phone]

## 2020-07-06 NOTE — Assessment & Plan Note (Signed)
Mr. Kai would like to take this opportunity to quit smoking and would like refill of nicotine patches.  Discussed use of patches, gum, inhalers, and medications.  Previously given support line as needed for assistance.  Continue to monitor.

## 2020-07-06 NOTE — Assessment & Plan Note (Signed)
Mr. Gabriel [name] appears to have well-controlled HIV disease with good adherence and tolerance to his ART regimen of Symtuza.  No signs/symptoms of opportunistic infection or progressive HIV disease.  We reviewed previous lab work and discussed plan of care.  Continue current dose of Symtuza.  Check blood work today.  Plan for follow-up in 3 months or sooner if needed.

## 2020-07-06 NOTE — Assessment & Plan Note (Signed)
Mr. 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  He is improving and would anticipate improvements over the upcoming week and may return to work on 07/20/2020 without restrictions.  Paperwork completed reflecting this.  In the meantime continue over-the-counter medications as needed for symptom relief and supportive care.

## 2020-07-06 NOTE — Patient Instructions (Signed)
Nice to see you.  Continue to take your Symtuza daily.  Refills will be sent to the pharmacy.  We will check your lab work today.   Schedule routine dental care at your convenience  May return to work on 9/27 without restriction.   Plan for follow up in 3 months or sooner if needed.

## 2020-07-06 NOTE — Assessment & Plan Note (Signed)
   Declines influenza vaccination today.  Discussed importance of safe sexual practice to reduce risk of STI.  Condoms declined.

## 2020-07-07 LAB — T-HELPER CELL (CD4) - (RCID CLINIC ONLY)
CD4 % Helper T Cell: 20 % — ABNORMAL LOW (ref 33–65)
CD4 T Cell Abs: 298 /uL — ABNORMAL LOW (ref 400–1790)

## 2020-07-09 LAB — LIPID PANEL
Cholesterol: 296 mg/dL — ABNORMAL HIGH (ref <200)
HDL: 39 mg/dL — ABNORMAL LOW (ref >=40)
Non-HDL Cholesterol (Calc): 257 mg/dL (calc) — ABNORMAL HIGH (ref <130)
Total CHOL/HDL Ratio: 7.6 (calc) — ABNORMAL HIGH (ref <5.0)
Triglycerides: 1087 mg/dL — ABNORMAL HIGH (ref <150)

## 2020-07-09 LAB — CBC
HCT: 44.1 % (ref 38.5–50.0)
Hemoglobin: 15 g/dL (ref 13.2–17.1)
MCH: 31.8 pg (ref 27.0–33.0)
MCHC: 34 g/dL (ref 32.0–36.0)
MCV: 93.4 fL (ref 80.0–100.0)
MPV: 12 fL (ref 7.5–12.5)
Platelets: 160 10*3/uL (ref 140–400)
RBC: 4.72 10*6/uL (ref 4.20–5.80)
RDW: 14.2 % (ref 11.0–15.0)
WBC: 3.7 10*3/uL — ABNORMAL LOW (ref 3.8–10.8)

## 2020-07-09 LAB — COMPLETE METABOLIC PANEL WITH GFR
AG Ratio: 1.4 (calc) (ref 1.0–2.5)
ALT: 16 U/L (ref 9–46)
AST: 16 U/L (ref 10–40)
Albumin: 4.2 g/dL (ref 3.6–5.1)
Alkaline phosphatase (APISO): 97 U/L (ref 36–130)
BUN: 18 mg/dL (ref 7–25)
CO2: 26 mmol/L (ref 20–32)
Calcium: 8.8 mg/dL (ref 8.6–10.3)
Chloride: 106 mmol/L (ref 98–110)
Creat: 1.1 mg/dL (ref 0.60–1.35)
GFR, Est African American: 94 mL/min/{1.73_m2} (ref >=60)
GFR, Est Non African American: 81 mL/min/{1.73_m2} (ref >=60)
Globulin: 3.1 g/dL (calc) (ref 1.9–3.7)
Glucose, Bld: 91 mg/dL (ref 65–99)
Potassium: 3.9 mmol/L (ref 3.5–5.3)
Sodium: 140 mmol/L (ref 135–146)
Total Bilirubin: 0.4 mg/dL (ref 0.2–1.2)
Total Protein: 7.3 g/dL (ref 6.1–8.1)

## 2020-07-09 LAB — HIV-1 RNA QUANT-NO REFLEX-BLD
HIV 1 RNA Quant: 1100 Copies/mL — ABNORMAL HIGH
HIV-1 RNA Quant, Log: 3.04 Log cps/mL — ABNORMAL HIGH

## 2020-07-09 LAB — FLUORESCENT TREPONEMAL AB(FTA)-IGG-BLD: Fluorescent Treponemal ABS: REACTIVE — AB

## 2020-07-09 LAB — RPR TITER: RPR Titer: 1:4 {titer} — ABNORMAL HIGH

## 2020-07-09 LAB — RPR: RPR Ser Ql: REACTIVE — AB

## 2020-07-13 ENCOUNTER — Telehealth: Payer: Self-pay

## 2020-07-13 NOTE — Telephone Encounter (Signed)
Patient sent MyChart message regarding forms dropped off last week. Forms located in triage accordion folder, copies made one placed in Greg's box for signatures. Once completed return to: [place] [employer]  Fax:-[phone]

## 2020-07-15 ENCOUNTER — Other Ambulatory Visit: Payer: BC Managed Care – PPO

## 2020-07-15 DIAGNOSIS — Z20822 Contact with and (suspected) exposure to covid-19: Secondary | ICD-10-CM | POA: Diagnosis not present

## 2020-07-16 ENCOUNTER — Telehealth: Payer: Self-pay

## 2020-07-16 LAB — NOVEL CORONAVIRUS, NAA: SARS-CoV-2, NAA: NOT DETECTED

## 2020-07-16 LAB — SARS-COV-2, NAA 2 DAY TAT

## 2020-07-16 NOTE — Telephone Encounter (Signed)
Faxed signed FMLA form to [employer], confirmation received. Faxed 07/10/2020, copy placed to be scanned into chart. Crissie Figures

## 2020-08-18 ENCOUNTER — Other Ambulatory Visit: Payer: Self-pay | Admitting: Family

## 2020-08-19 ENCOUNTER — Telehealth: Payer: Self-pay | Admitting: *Deleted

## 2020-08-19 NOTE — Telephone Encounter (Signed)
Faxed completed short term disability form per [name] to [place], attention [place]. Placed in box for scanning to chart.  Incident number [id number] Fax: [id number] Phone: [phone] Andree Coss, RN

## 2020-08-24 ENCOUNTER — Other Ambulatory Visit: Payer: BC Managed Care – PPO

## 2020-08-24 DIAGNOSIS — Z20822 Contact with and (suspected) exposure to covid-19: Secondary | ICD-10-CM

## 2020-08-25 LAB — NOVEL CORONAVIRUS, NAA: SARS-CoV-2, NAA: NOT DETECTED

## 2020-08-25 LAB — SARS-COV-2, NAA 2 DAY TAT

## 2020-10-28 ENCOUNTER — Other Ambulatory Visit: Payer: BC Managed Care – PPO

## 2020-11-11 ENCOUNTER — Telehealth: Payer: Self-pay

## 2020-11-11 NOTE — Telephone Encounter (Signed)
Received denial fax from [facility] for [employer]. Medication did not meet criteria for insurance coverage. Will forward message to provider and pharmacy team regarding appeal. F:[phone]

## 2020-11-11 NOTE — Telephone Encounter (Signed)
Per Carylon Perches Tech Patient insurance will pay for Dovato, Alpine, Symfi, Symfi Lo, Triumeq, Isentress + Cimduo or Tivicay + Cimduo, [name].  He have to try at least 3 of the medications above 1st and fail before they will pay for Symtuza.  Will forward message to provider.

## 2020-11-12 NOTE — Telephone Encounter (Signed)
We can get him started on [name] daily. Thanks.

## 2020-11-13 ENCOUNTER — Other Ambulatory Visit: Payer: Self-pay

## 2020-11-13 DIAGNOSIS — B20 Human immunodeficiency virus [HIV] disease: Secondary | ICD-10-CM

## 2020-11-13 MED ORDER — BICTEGRAVIR-EMTRICITAB-TENOFOV 50-200-25 MG PO TABS: 1.0000 | ORAL_TABLET | Freq: Every day | ORAL | 0 refills | Status: AC

## 2020-11-13 NOTE — Telephone Encounter (Signed)
New Rx was sent to pharmacy. D/c symtuza. Called patient to schedule an appointment and also to update with medication change.  Left vm. Will send mychart message.

## 2020-11-16 ENCOUNTER — Encounter: Payer: BC Managed Care – PPO | Admitting: Family

## 2020-12-10 ENCOUNTER — Telehealth: Payer: Self-pay

## 2020-12-10 NOTE — Telephone Encounter (Signed)
[facility] Patient Advocate Encounter   Was successful in obtaining a [employer] copay card for [name].  This copay card will make the patients copay $0.00.  I have spoken with the patient.         Jude Linck, CPhT [facility]ty Pharmacy Patient Advocate [facility] Phone: [phone] Fax:  [phone]  

## 2020-12-25 ENCOUNTER — Ambulatory Visit (INDEPENDENT_AMBULATORY_CARE_PROVIDER_SITE_OTHER): Payer: No Typology Code available for payment source | Admitting: Family

## 2020-12-25 ENCOUNTER — Encounter: Payer: Self-pay | Admitting: Family

## 2020-12-25 ENCOUNTER — Other Ambulatory Visit: Payer: Self-pay

## 2020-12-25 VITALS — BP 132/83 | HR 91 | Wt 227.0 lb

## 2020-12-25 DIAGNOSIS — Z0000 Encounter for general adult medical examination without abnormal findings: Secondary | ICD-10-CM | POA: Diagnosis not present

## 2020-12-25 DIAGNOSIS — Z113 Encounter for screening for infections with a predominantly sexual mode of transmission: Secondary | ICD-10-CM | POA: Diagnosis not present

## 2020-12-25 DIAGNOSIS — B20 Human immunodeficiency virus [HIV] disease: Secondary | ICD-10-CM

## 2020-12-25 MED ORDER — BICTEGRAVIR-EMTRICITAB-TENOFOV 50-200-25 MG PO TABS: 1.0000 | ORAL_TABLET | Freq: Every day | ORAL | 4 refills | Status: DC

## 2020-12-25 NOTE — Assessment & Plan Note (Addendum)
   Discussed importance of safe sexual practice to reduce risk of STI.  Condoms declined.  Encouraged routine dental care with commercial insurance to schedule independently.  We will refer to [facility] infectious disease for evaluation of anal Pap smear.

## 2020-12-25 NOTE — Assessment & Plan Note (Signed)
Mr. Osborn last lab work concerning for less than optimal adherence to his ART regimen.  Discussed importance of taking medication daily as prescribed.  No signs/symptoms of opportunistic infection or progressive HIV disease.  We reviewed previous lab work and discussed the plan of care.  Start taking [name].  Check blood work today including genotype.  Plan for follow-up in 1 month or sooner if needed with change of medication to recheck viral load.

## 2020-12-25 NOTE — Patient Instructions (Signed)
Nice to see you.  We will check your blood work today.  Start taking the Ruch today.  Recommend establishing with primary care.  Plan for follow up in 1 month or sooner if needed to recheck viral load.   Have a great day.

## 2020-12-25 NOTE — Progress Notes (Signed)
Subjective:    Patient ID: Gabriel [name], male    DOB: 10/29/1975, 45 y.o.   MRN: [id number]  Chief Complaint  Patient presents with  . Follow-up    Declined condoms;      HPI:  Gabriel [name] is a 45 y.o. male with HIV disease last seen on 07/06/2020 with apparent well-controlled virus and good adherence and tolerance to his ART regimen of Symtuza.  Viral load was found to be 1100 with CD4 count of 298.  RPR positive at 1: 4.  Previous treatment at 1: 38 in the June 2016.  Here today for routine follow-up.  Gabriel [name] medication has been changed to [place] secondary to insurance coverage.  He has not started taking [name] yet and continues to take his remaining Symtuza.  He is feeling off and overall not well having "pain".  Requesting full body examination and has concerns about a bump near his rectum.  Interested in receiving anal Pap smear. Denies fevers, chills, night sweats, headaches, changes in vision, neck pain/stiffness, nausea, diarrhea, vomiting, lesions or rashes.  Gabriel [name] insurance has changed to [place] healthcare with recommendation for [employer].  Denies feelings of being down, depressed, or hopeless recently.  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  He remains a current everyday smoker of 1 pack of cigarettes per day on average.  Has noted having looser stools recently that primarily occur after eating.   Allergies  Allergen Reactions  . Abacavir Other (See Comments)    HLA [id number] positive - should not receive abacavir due to risk of hypersensitivity      Outpatient Medications Prior to Visit  Medication Sig Dispense Refill  . fexofenadine-pseudoephedrine (ALLEGRA-D) 60-120 MG 12 hr tablet Take 1 tablet by mouth 2 (two) times daily. 20 tablet 0  . ibuprofen (ADVIL) 600 MG tablet Take 1 tablet (600 mg total) by mouth every 6 (six) hours as needed for up to 40 doses for fever or mild pain. 40 tablet 0  . nicotine (NICODERM  CQ) 21 mg/24hr patch Place 1 patch (21 mg total) onto the skin daily. 28 patch 1   No facility-administered medications prior to visit.     Past Medical History:  Diagnosis Date  . Abscess    buttocks  . Cellulitis   . HIV positive ([place])   . MRSA infection      Past Surgical History:  Procedure Laterality Date  . HAND / FINGER LESION EXCISION         Review of Systems  Constitutional: Negative for appetite change, chills, fatigue, fever and unexpected weight change.  Eyes: Negative for visual disturbance.  Respiratory: Negative for cough, chest tightness, shortness of breath and wheezing.   Cardiovascular: Negative for chest pain and leg swelling.  Gastrointestinal: Positive for diarrhea and nausea. Negative for abdominal pain, constipation and vomiting.  Genitourinary: Negative for dysuria, flank pain, frequency, genital sores, hematuria and urgency.  Skin: Negative for rash.  Allergic/Immunologic: Negative for immunocompromised state.  Neurological: Negative for dizziness and headaches.      Objective:    BP 132/83   Pulse 91   Wt 227 lb (103 kg)   BMI 26.23 kg/m  Nursing note and vital signs reviewed.  Physical Exam Constitutional:      General: He is not in acute distress.    Appearance: He is well-developed.  HENT:     Mouth/Throat:     Mouth: Oropharynx is clear and moist.  Eyes:  Conjunctiva/sclera: Conjunctivae normal.  Cardiovascular:     Rate and Rhythm: Normal rate and regular rhythm.     Pulses: Intact distal pulses.     Heart sounds: Normal heart sounds. No murmur heard. No friction rub. No gallop.   Pulmonary:     Effort: Pulmonary effort is normal. No respiratory distress.     Breath sounds: Normal breath sounds. No wheezing or rales.  Chest:     Chest wall: No tenderness.  Abdominal:     General: Bowel sounds are normal.     Palpations: Abdomen is soft.     Tenderness: There is no abdominal tenderness.  Musculoskeletal:      Cervical back: Neck supple.  Lymphadenopathy:     Cervical: No cervical adenopathy.  Skin:    General: Skin is warm and dry.     Findings: No rash.  Neurological:     Mental Status: He is alert and oriented to person, place, and time.  Psychiatric:        Mood and Affect: Mood and affect normal.        Behavior: Behavior normal.        Thought Content: Thought content normal.        Judgment: Judgment normal.      Depression screen [facility] 2/9 12/25/2020 07/06/2020 08/20/2019 10/15/2018 11/23/2017  Decreased Interest 0 0 0 0 0  Down, Depressed, Hopeless 0 0 0 0 0  PHQ - 2 Score 0 0 0 0 0       Assessment & Plan:    Patient Active Problem List   Diagnosis Date Noted  . Eustachian tube dysfunction 12/20/2019  . Bilateral lower extremity pain 12/20/2019  . Cough 04/30/2019  . Healthcare maintenance 01/18/2019  . Genital warts 06/27/2017  . IVDU (intravenous drug user) 06/27/2017  . Depression 06/27/2017  . Hematochezia 03/26/2015  . Dermatitis 06/10/2014  . Dyslipidemia 04/14/2011  . Blood in stool 03/20/2009  . 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 DENTAL CARIES 01/02/2009  . TOBACCO USER 05/27/2008  . INSOMNIA, CHRONIC 05/27/2008  . Human immunodeficiency virus (HIV) disease ([place]) 11/28/2006  . SYPHILIS NOS 11/28/2006     Problem List Items Addressed This Visit      Other   Human immunodeficiency virus (HIV) disease ([place]) - Primary    Gabriel [name]'s last lab work concerning for less than optimal adherence to his ART regimen.  Discussed importance of taking medication daily as prescribed.  No signs/symptoms of opportunistic infection or progressive HIV disease.  We reviewed previous lab work and discussed the plan of care.  Start taking [name].  Check blood work today including genotype.  Plan for follow-up in 1 month or sooner if needed with change of medication to recheck viral load.      Relevant Medications   bictegravir-emtricitabine-tenofovir  AF ([name]) 50-200-25 MG TABS tablet   Other Relevant Orders   COMPLETE METABOLIC PANEL WITH GFR   HIV-1 RNA ultraquant reflex to gentyp+   Ambulatory referral to Infectious Disease   T-helper cells (CD4) count   Healthcare maintenance     Discussed importance of safe sexual practice to reduce risk of STI.  Condoms declined.  Encouraged routine dental care with commercial insurance to schedule independently.  We will refer to [facility] infectious disease for evaluation of anal Pap smear.       Other Visit Diagnoses    Screening for STDs (sexually transmitted diseases)       Relevant Orders  RPR       I am having Gabriel [name] start on bictegravir-emtricitabine-tenofovir AF. I am also having him maintain his fexofenadine-pseudoephedrine, ibuprofen, and nicotine.   Meds ordered this encounter  Medications  . bictegravir-emtricitabine-tenofovir AF ([name]) 50-200-25 MG TABS tablet    Sig: Take 1 tablet by mouth daily.    Dispense:  30 tablet    Refill:  4    Order Specific Question:   Supervising Provider    Answer:   Carlyle Basques [[id number]]     Follow-up: Return in about 1 month (around 01/25/2021), or if symptoms worsen or fail to improve.   Terri Piedra, MSN, FNP-C Nurse Practitioner [facility] for Infectious Disease [place] number: [phone]

## 2021-01-02 LAB — COMPLETE METABOLIC PANEL WITH GFR
AG Ratio: 1.4 (calc) (ref 1.0–2.5)
ALT: 12 U/L (ref 9–46)
AST: 15 U/L (ref 10–40)
Albumin: 4.3 g/dL (ref 3.6–5.1)
Alkaline phosphatase (APISO): 85 U/L (ref 36–130)
BUN: 13 mg/dL (ref 7–25)
CO2: 25 mmol/L (ref 20–32)
Calcium: 9.3 mg/dL (ref 8.6–10.3)
Chloride: 107 mmol/L (ref 98–110)
Creat: 0.92 mg/dL (ref 0.60–1.35)
GFR, Est African American: 117 mL/min/{1.73_m2} (ref >=60)
GFR, Est Non African American: 101 mL/min/{1.73_m2} (ref >=60)
Globulin: 3 g/dL (calc) (ref 1.9–3.7)
Glucose, Bld: 89 mg/dL (ref 65–99)
Potassium: 4.1 mmol/L (ref 3.5–5.3)
Sodium: 140 mmol/L (ref 135–146)
Total Bilirubin: 0.4 mg/dL (ref 0.2–1.2)
Total Protein: 7.3 g/dL (ref 6.1–8.1)

## 2021-01-02 LAB — HIV-1 RNA ULTRAQUANT REFLEX TO GENTYP+
HIV 1 RNA Quant: NOT DETECTED copies/mL
HIV-1 RNA Quant, Log: NOT DETECTED Log copies/mL

## 2021-01-02 LAB — T-HELPER CELLS (CD4) COUNT (NOT AT ARMC)
Absolute CD4: 346 cells/uL — ABNORMAL LOW (ref 490–1740)
CD4 T Helper %: 25 % — ABNORMAL LOW (ref 30–61)
Total lymphocyte count: 1406 cells/uL (ref 850–3900)

## 2021-01-02 LAB — RPR TITER: RPR Titer: 1:2 {titer} — ABNORMAL HIGH

## 2021-01-02 LAB — FLUORESCENT TREPONEMAL AB(FTA)-IGG-BLD: Fluorescent Treponemal ABS: REACTIVE — AB

## 2021-01-02 LAB — RPR: RPR Ser Ql: REACTIVE — AB

## 2021-01-15 ENCOUNTER — Encounter: Payer: Self-pay | Admitting: Family

## 2021-02-12 ENCOUNTER — Encounter: Payer: Self-pay | Admitting: Family

## 2021-02-12 ENCOUNTER — Ambulatory Visit (INDEPENDENT_AMBULATORY_CARE_PROVIDER_SITE_OTHER): Payer: No Typology Code available for payment source | Admitting: Family

## 2021-02-12 ENCOUNTER — Other Ambulatory Visit: Payer: Self-pay

## 2021-02-12 VITALS — BP 114/80 | HR 84 | Temp 97.8°F | Resp 16 | Ht 78.0 in | Wt 232.0 lb

## 2021-02-12 DIAGNOSIS — Z23 Encounter for immunization: Secondary | ICD-10-CM | POA: Diagnosis not present

## 2021-02-12 DIAGNOSIS — A539 Syphilis, unspecified: Secondary | ICD-10-CM

## 2021-02-12 DIAGNOSIS — Z0000 Encounter for general adult medical examination without abnormal findings: Secondary | ICD-10-CM | POA: Diagnosis not present

## 2021-02-12 DIAGNOSIS — Z79899 Other long term (current) drug therapy: Secondary | ICD-10-CM

## 2021-02-12 DIAGNOSIS — B20 Human immunodeficiency virus [HIV] disease: Secondary | ICD-10-CM | POA: Diagnosis not present

## 2021-02-12 DIAGNOSIS — E785 Hyperlipidemia, unspecified: Secondary | ICD-10-CM

## 2021-02-12 NOTE — Assessment & Plan Note (Signed)
Previous RPR titer of 1: 2 with treatment at 1: 64.  Appears to be serofast.  Currently asymptomatic.  Continue to monitor RPR's periodically.

## 2021-02-12 NOTE — Addendum Note (Signed)
Addended by: Clayborne Artist A on: 02/12/2021 10:52 AM   Modules accepted: Orders

## 2021-02-12 NOTE — Assessment & Plan Note (Signed)
Mr. Gabriel [name] continues to have well-controlled HIV disease with good adherence and tolerance to his ART regimen of [name].  No signs/symptoms of opportunistic infection or progressive HIV.  Reviewed previous lab work and discussed plan of care.  Now on rosuvastatin for hypertriglyceridemia.  Check blood work today.  Continue current dose of [name].  Plan for follow-up in 3 months or sooner if needed with lab work on the same day.

## 2021-02-12 NOTE — Patient Instructions (Signed)
Nice to see you.  Continue to take your medication daily as prescribed.  Refills were sent to the pharmacy.  We will check your blood work today.  Plan for follow-up in 3 months or sooner if needed with lab work on the same day.  Have a great day stay safe!

## 2021-02-12 NOTE — Progress Notes (Signed)
Patient ID: Gabriel [name], male    DOB: 10/12/1976, 45 y.o.   MRN: [id number]   Subjective:    Chief Complaint  Patient presents with  . Follow-up    b20     HPI:  Gabriel [name] is a 45 y.o. male with HIV disease last seen on 12/25/2020 with suspected less than optimal adherence to his ART regimen of [name].  Fortunately his viral load was undetectable with CD4 count of 346.  Here today for routine follow-up.  Gabriel [name] has been taking his [name] daily as prescribed with no adverse side effects or missed doses since his last office visit.  Overall feeling well today with no new concerns/complaints.  He has established with primary care who restarted him on rosuvastatin for hypertriglyceridemia. Denies fevers, chills, night sweats, headaches, changes in vision, neck pain/stiffness, nausea, diarrhea, vomiting, lesions or rashes.  Gabriel [name] has no problems obtaining medication from the pharmacy and remains covered [place] healthcare.  Denies feelings of being down, depressed, or hopeless recently.  No current recreational or illicit drug use, and continues to smoke approximately 1 pack of cigarettes per day and drinks socially.  Condoms provided. Routine dental care is up to date and asking about having his teeth pulled. Has a anal pap scheduled for next week with [facility] maintenance due include Menveo.     Allergies  Allergen Reactions  . Abacavir Other (See Comments)    HLA B5701 positive - should not receive abacavir due to risk of hypersensitivity      Outpatient Medications Prior to Visit  Medication Sig Dispense Refill  . bictegravir-emtricitabine-tenofovir AF ([name]) 50-200-25 MG TABS tablet Take 1 tablet by mouth daily. 30 tablet 4  . fexofenadine-pseudoephedrine (ALLEGRA-D) 60-120 MG 12 hr tablet Take 1 tablet by mouth 2 (two) times daily. 20 tablet 0  . ibuprofen (ADVIL) 600 MG tablet Take 1 tablet (600 mg total) by mouth every 6 (six) hours  as needed for up to 40 doses for fever or mild pain. 40 tablet 0  . rosuvastatin (CRESTOR) 20 MG tablet Take 20 mg by mouth at bedtime.    . nicotine (NICODERM CQ) 21 mg/24hr patch Place 1 patch (21 mg total) onto the skin daily. (Patient not taking: Reported on 02/12/2021) 28 patch 1   No facility-administered medications prior to visit.     Past Medical History:  Diagnosis Date  . Abscess    buttocks  . Cellulitis   . HIV positive ([place])   . MRSA infection      Past Surgical History:  Procedure Laterality Date  . HAND / FINGER LESION EXCISION         Review of Systems  Constitutional: Negative for appetite change, chills, fatigue, fever and unexpected weight change.  Eyes: Negative for visual disturbance.  Respiratory: Negative for cough, chest tightness, shortness of breath and wheezing.   Cardiovascular: Negative for chest pain and leg swelling.  Gastrointestinal: Negative for abdominal pain, constipation, diarrhea, nausea and vomiting.  Genitourinary: Negative for dysuria, flank pain, frequency, genital sores, hematuria and urgency.  Skin: Negative for rash.  Allergic/Immunologic: Negative for immunocompromised state.  Neurological: Negative for dizziness and headaches.      Objective:    BP 114/80   Pulse 84   Temp 97.8 F (36.6 C)   Resp 16   Ht '6\' 6"'  (1.981 m)   Wt 232 lb (105.2 kg)   SpO2 98%   BMI 26.81 kg/m  Nursing  note and vital signs reviewed.  Physical Exam Constitutional:      General: He is not in acute distress.    Appearance: He is well-developed.  Eyes:     Conjunctiva/sclera: Conjunctivae normal.  Cardiovascular:     Rate and Rhythm: Normal rate and regular rhythm.     Heart sounds: Normal heart sounds. No murmur heard. No friction rub. No gallop.   Pulmonary:     Effort: Pulmonary effort is normal. No respiratory distress.     Breath sounds: Normal breath sounds. No wheezing or rales.  Chest:     Chest wall: No tenderness.   Abdominal:     General: Bowel sounds are normal.     Palpations: Abdomen is soft.     Tenderness: There is no abdominal tenderness.  Musculoskeletal:     Cervical back: Neck supple.  Lymphadenopathy:     Cervical: No cervical adenopathy.  Skin:    General: Skin is warm and dry.     Findings: No rash.  Neurological:     Mental Status: He is alert and oriented to person, place, and time.  Psychiatric:        Behavior: Behavior normal.        Thought Content: Thought content normal.        Judgment: Judgment normal.      Depression screen [facility] 2/9 02/12/2021 12/25/2020 07/06/2020 08/20/2019 10/15/2018  Decreased Interest 0 0 0 0 0  Down, Depressed, Hopeless 0 0 0 0 0  PHQ - 2 Score 0 0 0 0 0       Assessment & Plan:    Patient Active Problem List   Diagnosis Date Noted  . Eustachian tube dysfunction 12/20/2019  . Bilateral lower extremity pain 12/20/2019  . Cough 04/30/2019  . Healthcare maintenance 01/18/2019  . Genital warts 06/27/2017  . IVDU (intravenous drug user) 06/27/2017  . Depression 06/27/2017  . Hematochezia 03/26/2015  . Dermatitis 06/10/2014  . Dyslipidemia 04/14/2011  . Blood in stool 03/20/2009  . 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 DENTAL CARIES 01/02/2009  . TOBACCO USER 05/27/2008  . INSOMNIA, CHRONIC 05/27/2008  . Human immunodeficiency virus (HIV) disease ([place]) 11/28/2006  . SYPHILIS NOS 11/28/2006     Problem List Items Addressed This Visit      Other   Human immunodeficiency virus (HIV) disease ([place]) - Primary    Gabriel [name] continues to have well-controlled HIV disease with good adherence and tolerance to his ART regimen of [name].  No signs/symptoms of opportunistic infection or progressive HIV.  Reviewed previous lab work and discussed plan of care.  Now on rosuvastatin for hypertriglyceridemia.  Check blood work today.  Continue current dose of [name].  Plan for follow-up in 3 months or sooner if needed with lab  work on the same day.      Relevant Orders   COMPLETE METABOLIC PANEL WITH GFR   HIV-1 RNA quant-no reflex-bld   T-helper cells (CD4) count   Dyslipidemia    Gabriel [name] was started on rosuvastatin for hypertriglyceridemia.  Recheck lipid profile today per his request.  Continue management per primary care.      Relevant Medications   rosuvastatin (CRESTOR) 20 MG tablet   Healthcare maintenance     Discussed importance of safe sexual practice to reduce risk of STI.  Condoms provided.  Has anal Pap smear scheduled for next week with [facility].  Routine dental care is up-to-date per recommendations.  Menveo updated today.  Will schedule a time for COVID booster.       Other Visit Diagnoses    Pharmacologic therapy       Relevant Orders   Lipid panel       I am having Daveion L. Heinzelman maintain his fexofenadine-pseudoephedrine, ibuprofen, nicotine, bictegravir-emtricitabine-tenofovir AF, and rosuvastatin.   Follow-up: Return in about 3 months (around 05/14/2021), or if symptoms worsen or fail to improve.   Terri Piedra, MSN, FNP-C Nurse Practitioner [facility] for Infectious Disease [place] number: [phone]

## 2021-02-12 NOTE — Assessment & Plan Note (Signed)
   Discussed importance of safe sexual practice to reduce risk of STI.  Condoms provided.  Has anal Pap smear scheduled for next week with [facility].  Routine dental care is up-to-date per recommendations.  Menveo updated today.  Will schedule a time for COVID booster.

## 2021-02-12 NOTE — Assessment & Plan Note (Signed)
Mr. Gabriel [name] was started on rosuvastatin for hypertriglyceridemia.  Recheck lipid profile today per his request.  Continue management per primary care.

## 2021-02-15 LAB — COMPLETE METABOLIC PANEL WITH GFR
AG Ratio: 1.7 (calc) (ref 1.0–2.5)
ALT: 17 U/L (ref 9–46)
AST: 16 U/L (ref 10–40)
Albumin: 4.2 g/dL (ref 3.6–5.1)
Alkaline phosphatase (APISO): 84 U/L (ref 36–130)
BUN: 13 mg/dL (ref 7–25)
CO2: 28 mmol/L (ref 20–32)
Calcium: 9.2 mg/dL (ref 8.6–10.3)
Chloride: 106 mmol/L (ref 98–110)
Creat: 1.05 mg/dL (ref 0.60–1.35)
GFR, Est African American: 100 mL/min/{1.73_m2} (ref >=60)
GFR, Est Non African American: 86 mL/min/{1.73_m2} (ref >=60)
Globulin: 2.5 g/dL (calc) (ref 1.9–3.7)
Glucose, Bld: 103 mg/dL — ABNORMAL HIGH (ref 65–99)
Potassium: 4.4 mmol/L (ref 3.5–5.3)
Sodium: 141 mmol/L (ref 135–146)
Total Bilirubin: 0.4 mg/dL (ref 0.2–1.2)
Total Protein: 6.7 g/dL (ref 6.1–8.1)

## 2021-02-15 LAB — LIPID PANEL
Cholesterol: 140 mg/dL (ref <200)
HDL: 38 mg/dL — ABNORMAL LOW (ref >=40)
LDL Cholesterol (Calc): 55 mg/dL (calc)
Non-HDL Cholesterol (Calc): 102 mg/dL (calc) (ref <130)
Total CHOL/HDL Ratio: 3.7 (calc) (ref <5.0)
Triglycerides: 391 mg/dL — ABNORMAL HIGH (ref <150)

## 2021-02-15 LAB — HIV-1 RNA QUANT-NO REFLEX-BLD
HIV 1 RNA Quant: <20 Copies/mL — ABNORMAL HIGH
HIV-1 RNA Quant, Log: <1.3 Log cps/mL — ABNORMAL HIGH

## 2021-02-15 LAB — T-HELPER CELLS (CD4) COUNT (NOT AT ARMC)
Absolute CD4: 379 cells/uL — ABNORMAL LOW (ref 490–1740)
CD4 T Helper %: 23 % — ABNORMAL LOW (ref 30–61)
Total lymphocyte count: 1637 cells/uL (ref 850–3900)

## 2021-03-23 ENCOUNTER — Ambulatory Visit: Payer: No Typology Code available for payment source | Admitting: Internal Medicine

## 2021-05-06 ENCOUNTER — Ambulatory Visit (INDEPENDENT_AMBULATORY_CARE_PROVIDER_SITE_OTHER): Payer: No Typology Code available for payment source | Admitting: Family

## 2021-05-06 ENCOUNTER — Encounter: Payer: Self-pay | Admitting: Family

## 2021-05-06 ENCOUNTER — Other Ambulatory Visit: Payer: Self-pay

## 2021-05-06 VITALS — BP 110/77 | HR 88 | Temp 97.6°F | Wt 230.0 lb

## 2021-05-06 DIAGNOSIS — G4700 Insomnia, unspecified: Secondary | ICD-10-CM | POA: Diagnosis not present

## 2021-05-06 DIAGNOSIS — Z0000 Encounter for general adult medical examination without abnormal findings: Secondary | ICD-10-CM | POA: Diagnosis not present

## 2021-05-06 DIAGNOSIS — B20 Human immunodeficiency virus [HIV] disease: Secondary | ICD-10-CM | POA: Diagnosis not present

## 2021-05-06 MED ORDER — BICTEGRAVIR-EMTRICITAB-TENOFOV 50-200-25 MG PO TABS: 1.0000 | ORAL_TABLET | Freq: Every day | ORAL | 4 refills | Status: DC

## 2021-05-06 MED ORDER — TRAZODONE HCL 50 MG PO TABS: 50.0000 mg | ORAL_TABLET | Freq: Every evening | ORAL | 0 refills | Status: DC | PRN

## 2021-05-06 NOTE — Assessment & Plan Note (Signed)
   Discussed importance of safe sexual practice to reduce risk of STI. Condoms offered.   Routine dental care up to date.   2nd dose of Menveo at next office visit.   Covid booster up to date.

## 2021-05-06 NOTE — Progress Notes (Signed)
Patient ID: Gabriel [name], male    DOB: 1976/09/30, 45 y.o.   MRN: [id number]   Subjective:    Chief Complaint  Patient presents with   Follow-up     HPI:  LEEVI CULLARS is a 45 y.o. male with HIV disease last seen on 02/13/2019 with well-controlled virus and good adherence and tolerance to his ART regimen of [name].  Viral load was undetectable with CD4 count of 379.  In the interim he was seen for his anal Pap smear.  Here today for routine follow-up.  Mr. Gladden continues to take his his [name] daily as prescribed with no adverse side effects or missed doses since his last office visit.  Overall feeling well today with some concern for his ability to fall asleep.  Has tried Benadryl in the past with mixed results. Denies fevers, chills, night sweats, headaches, changes in vision, neck pain/stiffness, nausea, diarrhea, vomiting, lesions or rashes.  Mr. Deer has no problems obtaining medication from the pharmacy and remains covered with [place] healthcare.  Denies feelings of being down, depressed, or hopeless recently.  Drinks alcohol socially; no current recreational/illicit drug use and smokes approximately 1 pack of cigarettes per day.  Received his COVID booster yesterday.  Routine dental care is up-to-date per recommendations.   Allergies  Allergen Reactions   Abacavir Other (See Comments)    HLA B5701 positive - should not receive abacavir due to risk of hypersensitivity      Outpatient Medications Prior to Visit  Medication Sig Dispense Refill   fexofenadine-pseudoephedrine (ALLEGRA-D) 60-120 MG 12 hr tablet Take 1 tablet by mouth 2 (two) times daily. 20 tablet 0   ibuprofen (ADVIL) 600 MG tablet Take 1 tablet (600 mg total) by mouth every 6 (six) hours as needed for up to 40 doses for fever or mild pain. 40 tablet 0   nicotine (NICODERM CQ) 21 mg/24hr patch Place 1 patch (21 mg total) onto the skin daily. 28 patch 1   rosuvastatin (CRESTOR) 20 MG tablet Take 20 mg by  mouth at bedtime.     bictegravir-emtricitabine-tenofovir AF ([name]) 50-200-25 MG TABS tablet Take 1 tablet by mouth daily. 30 tablet 4   No facility-administered medications prior to visit.     Past Medical History:  Diagnosis Date   Abscess    buttocks   Cellulitis    HIV positive ([place])    MRSA infection      Past Surgical History:  Procedure Laterality Date   HAND / FINGER LESION EXCISION         Review of Systems  Constitutional:  Negative for appetite change, chills, fatigue, fever and unexpected weight change.  Eyes:  Negative for visual disturbance.  Respiratory:  Negative for cough, chest tightness, shortness of breath and wheezing.   Cardiovascular:  Negative for chest pain and leg swelling.  Gastrointestinal:  Negative for abdominal pain, constipation, diarrhea, nausea and vomiting.  Genitourinary:  Negative for dysuria, flank pain, frequency, genital sores, hematuria and urgency.  Skin:  Negative for rash.  Allergic/Immunologic: Negative for immunocompromised state.  Neurological:  Negative for dizziness and headaches.     Objective:    BP 110/77   Pulse 88   Temp 97.6 F (36.4 C) (Oral)   Wt 230 lb (104.3 kg)   BMI 26.58 kg/m  Nursing note and vital signs reviewed.  Physical Exam Constitutional:      General: He is not in acute distress.    Appearance: He is well-developed.  Eyes:     Conjunctiva/sclera: Conjunctivae normal.  Cardiovascular:     Rate and Rhythm: Normal rate and regular rhythm.     Heart sounds: Normal heart sounds. No murmur heard.   No friction rub. No gallop.  Pulmonary:     Effort: Pulmonary effort is normal. No respiratory distress.     Breath sounds: Normal breath sounds. No wheezing or rales.  Chest:     Chest wall: No tenderness.  Abdominal:     General: Bowel sounds are normal.     Palpations: Abdomen is soft.     Tenderness: There is no abdominal tenderness.  Musculoskeletal:     Cervical back: Neck supple.   Lymphadenopathy:     Cervical: No cervical adenopathy.  Skin:    General: Skin is warm and dry.     Findings: No rash.  Neurological:     Mental Status: He is alert and oriented to person, place, and time.  Psychiatric:        Behavior: Behavior normal.        Thought Content: Thought content normal.        Judgment: Judgment normal.     Depression screen [facility] 2/9 02/12/2021 12/25/2020 07/06/2020 08/20/2019 10/15/2018  Decreased Interest 0 0 0 0 0  Down, Depressed, Hopeless 0 0 0 0 0  PHQ - 2 Score 0 0 0 0 0       Assessment & Plan:    Patient Active Problem List   Diagnosis Date Noted   Eustachian tube dysfunction 12/20/2019   Bilateral lower extremity pain 12/20/2019   Cough 04/30/2019   Healthcare maintenance 01/18/2019   Genital warts 06/27/2017   IVDU (intravenous drug user) 06/27/2017   Depression 06/27/2017   Hematochezia 03/26/2015   Dermatitis 06/10/2014   Dyslipidemia 04/14/2011   Blood in stool 03/20/2009   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 Hope continues to have well-controlled HIV disease with good adherence and tolerance to his ART regimen of [name].  No signs/symptoms of opportunistic infection.  We reviewed previous lab work and discussed plan of care.  Check blood work today.  Continue current dose of [name].  Plan for follow-up in 4 months or sooner if needed with lab work on the same day.        Relevant Medications   bictegravir-emtricitabine-tenofovir AF ([name]) 50-200-25 MG TABS tablet   Other Relevant Orders   HIV-1 RNA quant-no reflex-bld   T-helper cell (CD4)- ([facility] clinic only)   INSOMNIA, CHRONIC    Mr. Kina  continues to have issues with falling asleep. Discussed importance of sleep hygiene. Will start trazodone as needed. Will follow as this has been a chronic problem.          I am having Lennette Bihari L. Klecka start on traZODone. I am also having him maintain his fexofenadine-pseudoephedrine, ibuprofen, nicotine, rosuvastatin, and bictegravir-emtricitabine-tenofovir AF.   Meds ordered this encounter  Medications   bictegravir-emtricitabine-tenofovir AF ([name]) 50-200-25 MG TABS tablet    Sig: Take 1 tablet by mouth daily.    Dispense:  30 tablet    Refill:  4    Order Specific Question:  Supervising Provider    Answer:   Carlyle Basques [[id number]]   traZODone (DESYREL) 50 MG tablet    Sig: Take 1 tablet (50 mg total) by mouth at bedtime as needed for sleep.    Dispense:  30 tablet    Refill:  0    Order Specific Question:   Supervising Provider    Answer:   Carlyle Basques [[id number]]     Follow-up: Return in about 4 weeks (around 06/03/2021).   Terri Piedra, MSN, FNP-C Nurse Practitioner [facility] [place] number: [phone]

## 2021-05-06 NOTE — Assessment & Plan Note (Signed)
Mr. Torpey continues to have well-controlled HIV disease with good adherence and tolerance to his ART regimen of [name].  No signs/symptoms of opportunistic infection.  We reviewed previous lab work and discussed plan of care.  Check blood work today.  Continue current dose of [name].  Plan for follow-up in 4 months or sooner if needed with lab work on the same day.

## 2021-05-06 NOTE — Assessment & Plan Note (Signed)
Gabriel [name] continues to have issues with falling asleep. Discussed importance of sleep hygiene. Will start trazodone as needed. Will follow as this has been a chronic problem.

## 2021-05-06 NOTE — Patient Instructions (Addendum)
Nice to see you.  We checked your blood work today.  Continue to take your medications daily as prescribed.  Refills have been sent to the pharmacy.  Plan for follow up in 4 months or sooner if needed with lab work on the same day.   Have a great day and stay safe!

## 2021-05-07 ENCOUNTER — Ambulatory Visit: Payer: No Typology Code available for payment source | Admitting: Family

## 2021-05-07 LAB — T-HELPER CELL (CD4) - (RCID CLINIC ONLY)
CD4 % Helper T Cell: 25 % — ABNORMAL LOW (ref 33–65)
CD4 T Cell Abs: 356 /uL — ABNORMAL LOW (ref 400–1790)

## 2021-05-10 LAB — HIV-1 RNA QUANT-NO REFLEX-BLD
HIV 1 RNA Quant: <20 Copies/mL — ABNORMAL HIGH
HIV-1 RNA Quant, Log: <1.3 Log cps/mL — ABNORMAL HIGH

## 2021-06-03 ENCOUNTER — Ambulatory Visit: Payer: No Typology Code available for payment source | Admitting: Family

## 2021-06-11 ENCOUNTER — Telehealth: Payer: Self-pay

## 2021-06-11 NOTE — Telephone Encounter (Signed)
Patient called to inquired about Jynneos vaccine. Patient made aware that [facility] is offering vaccine. Patient meets the follow criteria MSM: multiple sexual partners anonymous sex partners   Allergies verified with patient (Abacavir only)   Patient scheduled with Pharmacy clinic 06/14/21 at 9:15am   Patient agrees and confirmed appointment.  Valarie Cones

## 2021-06-14 ENCOUNTER — Ambulatory Visit: Payer: No Typology Code available for payment source | Admitting: Pharmacist

## 2022-03-08 ENCOUNTER — Other Ambulatory Visit: Payer: Self-pay | Admitting: Family

## 2022-03-18 DIAGNOSIS — B20 Human immunodeficiency virus [HIV] disease: Secondary | ICD-10-CM

## 2022-03-18 MED ORDER — BICTEGRAVIR-EMTRICITAB-TENOFOV 50-200-25 MG PO TABS: 1.0000 | ORAL_TABLET | Freq: Every day | ORAL | 0 refills | Status: DC

## 2022-07-11 ENCOUNTER — Encounter: Payer: Self-pay | Admitting: Family

## 2022-07-11 ENCOUNTER — Ambulatory Visit (INDEPENDENT_AMBULATORY_CARE_PROVIDER_SITE_OTHER): Payer: No Typology Code available for payment source | Admitting: Family

## 2022-07-11 ENCOUNTER — Other Ambulatory Visit: Payer: Self-pay

## 2022-07-11 VITALS — BP 117/77 | HR 90 | Temp 98.2°F | Resp 16 | Ht 78.0 in | Wt 223.2 lb

## 2022-07-11 DIAGNOSIS — Z113 Encounter for screening for infections with a predominantly sexual mode of transmission: Secondary | ICD-10-CM | POA: Diagnosis not present

## 2022-07-11 DIAGNOSIS — Z79899 Other long term (current) drug therapy: Secondary | ICD-10-CM | POA: Diagnosis not present

## 2022-07-11 DIAGNOSIS — Z0000 Encounter for general adult medical examination without abnormal findings: Secondary | ICD-10-CM

## 2022-07-11 DIAGNOSIS — Z9189 Other specified personal risk factors, not elsewhere classified: Secondary | ICD-10-CM

## 2022-07-11 DIAGNOSIS — B20 Human immunodeficiency virus [HIV] disease: Secondary | ICD-10-CM

## 2022-07-11 MED ORDER — BICTEGRAVIR-EMTRICITAB-TENOFOV 50-200-25 MG PO TABS: 1.0000 | ORAL_TABLET | Freq: Every day | ORAL | 3 refills | Status: DC

## 2022-07-11 NOTE — Patient Instructions (Signed)
Nice to see you.  We will check your lab work today.  Continue to take your medication daily as prescribed.  Refills have been sent to the pharmacy.  We will wait for your lab work to check for resistance and get insurance approval.   We will refer you colonoscopy  Plan for follow up in 1 months or sooner if needed with lab work on the same day.  Have a great day and stay safe!

## 2022-07-11 NOTE — Assessment & Plan Note (Signed)
Gabriel [name] has poorly controlled virus secondary to being off medication for the past 1.5-2 months secondary to his busy schedule. Discussed importance of taking medication as prescribed. Introduced long acting injectable medications including risks, benefits, and logistics. We will check a Genosure today. I expressed my concern for need for improved attendance and follow up as he would need to be seen every 2 months and assures me that he can do this. Will await Genosure and if insurance approves will consider switch. Check remaining lab work. Continue current dose of [name]. Plan for follow up in 1 month or sooner if needed.

## 2022-07-11 NOTE — Assessment & Plan Note (Signed)
   Discussed importance of safe sexual practice and condom use. Condoms and STD testing offered.   Declines vaccinations.   Routine dental care up to date.

## 2022-07-11 NOTE — Progress Notes (Signed)
Brief Narrative   Patient ID: Gabriel [name], male    DOB: 1976/08/08, 46 y.o.   MRN: [id number]  Gabriel [name] is a 46 y/o AA male diagnosed with HIV disease in January 2008 with risk factor of MSM. Initial viral load of 96,900 and CD4 count 160. Genosure with K103N (efavirenz, nevirapine) medication resistance. Entered care at [facility] Stage 3. Previous ART experience with Prezista/ritonavir, Prezcobix, Truvada, Descovy, and [name].  Subjective:    Chief Complaint  Patient presents with   Follow-up    B20 -    HPI:  Gabriel [name] is a 46 y.o. male with HIV/AIDS last seen on 05/06/21 with well controlled virus and good adherence and tolerance to [place]. Viral load was undetectable and CD4 count 356. Here today for follow up.   Gabriel [name] has been off medication for about 1.5 to 2 months after running out of medication. Feeling well today and has questions about long acting injectable medication. Denies fevers, chills, night sweats, headaches, changes in vision, neck pain/stiffness, nausea, diarrhea, vomiting, lesions or rashes. Condoms offered. 2nd dose of Menveo.  Working full time at [employer].    Allergies  Allergen Reactions   Abacavir Other (See Comments)    HLA B5701 positive - should not receive abacavir due to risk of hypersensitivity      Outpatient Medications Prior to Visit  Medication Sig Dispense Refill   fexofenadine-pseudoephedrine (ALLEGRA-D) 60-120 MG 12 hr tablet Take 1 tablet by mouth 2 (two) times daily. (Patient not taking: Reported on 07/11/2022) 20 tablet 0   ibuprofen (ADVIL) 600 MG tablet Take 1 tablet (600 mg total) by mouth every 6 (six) hours as needed for up to 40 doses for fever or mild pain. (Patient not taking: Reported on 07/11/2022) 40 tablet 0   nicotine (NICODERM CQ) 21 mg/24hr patch Place 1 patch (21 mg total) onto the skin daily. (Patient not taking: Reported on 07/11/2022) 28 patch 1   rosuvastatin (CRESTOR) 20 MG tablet Take 20 mg by  mouth at bedtime. (Patient not taking: Reported on 07/11/2022)     traZODone (DESYREL) 50 MG tablet Take 1 tablet (50 mg total) by mouth at bedtime as needed for sleep. (Patient not taking: Reported on 07/11/2022) 30 tablet 0   bictegravir-emtricitabine-tenofovir AF ([name]) 50-200-25 MG TABS tablet Take 1 tablet by mouth daily. (Patient not taking: Reported on 07/11/2022) 30 tablet 0   No facility-administered medications prior to visit.     Past Medical History:  Diagnosis Date   Abscess    buttocks   Cellulitis    HIV positive ([place])    MRSA infection      Past Surgical History:  Procedure Laterality Date   HAND / FINGER LESION EXCISION        Review of Systems  Constitutional:  Negative for appetite change, chills, fatigue, fever and unexpected weight change.  Eyes:  Negative for visual disturbance.  Respiratory:  Negative for cough, chest tightness, shortness of breath and wheezing.   Cardiovascular:  Negative for chest pain and leg swelling.  Gastrointestinal:  Negative for abdominal pain, constipation, diarrhea, nausea and vomiting.  Genitourinary:  Negative for dysuria, flank pain, frequency, genital sores, hematuria and urgency.  Skin:  Negative for rash.  Allergic/Immunologic: Negative for immunocompromised state.  Neurological:  Negative for dizziness and headaches.      Objective:    BP 117/77   Pulse 90   Temp 98.2 F (36.8 C) (Oral)   Resp 16  Ht _0  (1.981 m)   Wt 223 lb 3.2 oz (101.2 kg)   SpO2 98%   BMI 25.79 kg/m  Nursing note and vital signs reviewed.  Physical Exam Constitutional:      General: He is not in acute distress.    Appearance: He is well-developed.  Eyes:     Conjunctiva/sclera: Conjunctivae normal.  Cardiovascular:     Rate and Rhythm: Normal rate and regular rhythm.     Heart sounds: Normal heart sounds. No murmur heard.    No friction rub. No gallop.  Pulmonary:     Effort: Pulmonary effort is normal. No respiratory  distress.     Breath sounds: Normal breath sounds. No wheezing or rales.  Chest:     Chest wall: No tenderness.  Abdominal:     General: Bowel sounds are normal.     Palpations: Abdomen is soft.     Tenderness: There is no abdominal tenderness.  Musculoskeletal:     Cervical back: Neck supple.  Lymphadenopathy:     Cervical: No cervical adenopathy.  Skin:    General: Skin is warm and dry.     Findings: No rash.  Neurological:     Mental Status: He is alert and oriented to person, place, and time.  Psychiatric:        Behavior: Behavior normal.        Thought Content: Thought content normal.        Judgment: Judgment normal.         07/11/2022    2:58 PM 02/12/2021    8:42 AM 12/25/2020    9:47 AM 07/06/2020    3:45 PM 08/20/2019   10:15 AM  Depression screen PHQ 2/9  Decreased Interest 0 0 0 0 0  Down, Depressed, Hopeless 0 0 0 0 0  PHQ - 2 Score 0 0 0 0 0       Assessment & Plan:    Patient Active Problem List   Diagnosis Date Noted   Eustachian tube dysfunction 12/20/2019   Bilateral lower extremity pain 12/20/2019   Cough 04/30/2019   Healthcare maintenance 01/18/2019   Genital warts 06/27/2017   IVDU (intravenous drug user) 06/27/2017   Depression 06/27/2017   Hematochezia 03/26/2015   Dermatitis 06/10/2014   Dyslipidemia 04/14/2011   Blood in stool 03/20/2009   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 Discussed importance of taking medication as prescribed. Introduced long acting injectable medications including risks,  benefits, and logistics. We will check a Genosure today. I expressed my concern for need for improved attendance and follow up as he would need to be seen every 2 months and assures me that he can do this. Will await Genosure and if insurance approves will consider switch. Check remaining lab work. Continue current dose of [name]. Plan for follow up in 1 month or sooner if needed.       Relevant Medications   bictegravir-emtricitabine-tenofovir AF ([name]) 50-200-25 MG TABS tablet   Other Relevant Orders   HIV RNA, RTPCR W/R GT (  RTI, PI,INT)   T-helper cell (CD4)- ([facility] clinic only)   Comprehensive metabolic panel   CBC with Differential/Platelet   Healthcare maintenance    Discussed importance of safe sexual practice and condom use. Condoms and STD testing offered.  Declines vaccinations.  Routine dental care up to date.       Other Visit Diagnoses     Screening for STDs (sexually transmitted diseases)       Relevant Orders   RPR   Pharmacologic therapy       Relevant Orders   Lipid panel   At risk for colon cancer       Relevant Orders   Ambulatory referral to Gastroenterology        I am having Gabriel [name] maintain his fexofenadine-pseudoephedrine, ibuprofen, nicotine, rosuvastatin, traZODone, and bictegravir-emtricitabine-tenofovir AF.   Meds ordered this encounter  Medications   bictegravir-emtricitabine-tenofovir AF ([name]) 50-200-25 MG TABS tablet    Sig: Take 1 tablet by mouth daily.    Dispense:  30 tablet    Refill:  3    Order Specific Question:   Supervising Provider    Answer:   Carlyle Basques [[id number]]     Follow-up: Return in about 1 month (around 08/10/2022), or if symptoms worsen or fail to improve.   Terri Piedra, MSN, FNP-C Nurse Practitioner [facility] [place] number: [phone]

## 2022-07-12 LAB — T-HELPER CELL (CD4) - (RCID CLINIC ONLY)
CD4 % Helper T Cell: 16 % — ABNORMAL LOW (ref 33–65)
CD4 T Cell Abs: 241 /uL — ABNORMAL LOW (ref 400–1790)

## 2022-07-20 LAB — CBC WITH DIFFERENTIAL/PLATELET
Absolute Monocytes: 479 cells/uL (ref 200–950)
Basophils Absolute: 19 cells/uL (ref 0–200)
Basophils Relative: 0.5 %
Eosinophils Absolute: 68 cells/uL (ref 15–500)
Eosinophils Relative: 1.8 %
HCT: 43.1 % (ref 38.5–50.0)
Hemoglobin: 14.9 g/dL (ref 13.2–17.1)
Lymphs Abs: 1737 cells/uL (ref 850–3900)
MCH: 32.3 pg (ref 27.0–33.0)
MCHC: 34.6 g/dL (ref 32.0–36.0)
MCV: 93.3 fL (ref 80.0–100.0)
MPV: 12.2 fL (ref 7.5–12.5)
Monocytes Relative: 12.6 %
Neutro Abs: 1497 cells/uL — ABNORMAL LOW (ref 1500–7800)
Neutrophils Relative %: 39.4 %
Platelets: 147 10*3/uL (ref 140–400)
RBC: 4.62 10*6/uL (ref 4.20–5.80)
RDW: 13.5 % (ref 11.0–15.0)
Total Lymphocyte: 45.7 %
WBC: 3.8 10*3/uL (ref 3.8–10.8)

## 2022-07-20 LAB — COMPREHENSIVE METABOLIC PANEL
AG Ratio: 1.4 (calc) (ref 1.0–2.5)
ALT: 19 U/L (ref 9–46)
AST: 19 U/L (ref 10–40)
Albumin: 4.2 g/dL (ref 3.6–5.1)
Alkaline phosphatase (APISO): 97 U/L (ref 36–130)
BUN: 12 mg/dL (ref 7–25)
CO2: 25 mmol/L (ref 20–32)
Calcium: 9.2 mg/dL (ref 8.6–10.3)
Chloride: 108 mmol/L (ref 98–110)
Creat: 1.01 mg/dL (ref 0.60–1.29)
Globulin: 2.9 g/dL (calc) (ref 1.9–3.7)
Glucose, Bld: 81 mg/dL (ref 65–99)
Potassium: 3.7 mmol/L (ref 3.5–5.3)
Sodium: 143 mmol/L (ref 135–146)
Total Bilirubin: 0.4 mg/dL (ref 0.2–1.2)
Total Protein: 7.1 g/dL (ref 6.1–8.1)

## 2022-07-20 LAB — HIV-1 GENOTYPE: HIV-1 Genotype: DETECTED — AB

## 2022-07-20 LAB — HIV-1 INTEGRASE GENOTYPE

## 2022-07-20 LAB — LIPID PANEL
Cholesterol: 169 mg/dL (ref <200)
HDL: 22 mg/dL — ABNORMAL LOW (ref >=40)
Non-HDL Cholesterol (Calc): 147 mg/dL (calc) — ABNORMAL HIGH (ref <130)
Total CHOL/HDL Ratio: 7.7 (calc) — ABNORMAL HIGH (ref <5.0)
Triglycerides: 941 mg/dL — ABNORMAL HIGH (ref <150)

## 2022-07-20 LAB — RPR TITER: RPR Titer: 1:1 {titer} — ABNORMAL HIGH

## 2022-07-20 LAB — HIV RNA, RTPCR W/R GT (RTI, PI,INT)
HIV 1 RNA Quant: 38500 copies/mL — ABNORMAL HIGH
HIV-1 RNA Quant, Log: 4.59 Log copies/mL — ABNORMAL HIGH

## 2022-07-20 LAB — FLUORESCENT TREPONEMAL AB(FTA)-IGG-BLD: Fluorescent Treponemal ABS: REACTIVE — AB

## 2022-07-20 LAB — RPR: RPR Ser Ql: REACTIVE — AB

## 2022-08-01 ENCOUNTER — Telehealth: Payer: Self-pay

## 2022-08-01 ENCOUNTER — Other Ambulatory Visit (HOSPITAL_COMMUNITY): Payer: Self-pay

## 2022-08-01 NOTE — Telephone Encounter (Signed)
[facility] Patient Advocate Encounter   Received notification from [facility] that prior authorization for Kern Reap is required. (Medical Benefits)   PA submitted on 08/01/22 [place] [id number] Status is pending  Faxed chart notes and labs to [facility] will continue to follow.   Ileene Patrick, [facility] Patient [facility] for Infectious Disease Phone: [phone] Fax:  [phone]

## 2022-08-03 ENCOUNTER — Other Ambulatory Visit: Payer: Self-pay

## 2022-08-03 ENCOUNTER — Ambulatory Visit (INDEPENDENT_AMBULATORY_CARE_PROVIDER_SITE_OTHER): Payer: No Typology Code available for payment source | Admitting: Family

## 2022-08-03 ENCOUNTER — Encounter: Payer: Self-pay | Admitting: Family

## 2022-08-03 VITALS — HR 92 | Ht 78.0 in | Wt 223.0 lb

## 2022-08-03 DIAGNOSIS — B20 Human immunodeficiency virus [HIV] disease: Secondary | ICD-10-CM

## 2022-08-03 DIAGNOSIS — Z789 Other specified health status: Secondary | ICD-10-CM

## 2022-08-03 NOTE — Progress Notes (Signed)
Brief Narrative   Patient ID: Gabriel [name], male    DOB: August 08, 1976, 46 y.o.   MRN: [id number]    Subjective:    Chief Complaint  Patient presents with   Follow-up    HPI:  Gabriel [name] is a 46 y.o. male with HIV disease last seen on 07/11/22 with poorly controlled virus having been off medication.  Viral load was 38,500 with CD4 count 241. Genosure with K103N resistance consistent with previous resistance. Discussed long acting injectable medications. Continued on [name]. Here today for follow up.  Gabriel [name] continues to have less than optimal adherence to Gabriel [name] taking it sporadically. Has plenty of medication and just does not like taking pills. Continues to drink a significant amount of alcohol. Working full time for [employer] at home. Feeling well today and wondering when he can get started on injectable medication.   Denies fevers, chills, night sweats, headaches, changes in vision, neck pain/stiffness, nausea, diarrhea, vomiting, lesions or rashes.    Allergies  Allergen Reactions   Abacavir Other (See Comments)    HLA B5701 positive - should not receive abacavir due to risk of hypersensitivity      Outpatient Medications Prior to Visit  Medication Sig Dispense Refill   bictegravir-emtricitabine-tenofovir AF ([name]) 50-200-25 MG TABS tablet Take 1 tablet by mouth daily. 30 tablet 3   fexofenadine-pseudoephedrine (ALLEGRA-D) 60-120 MG 12 hr tablet Take 1 tablet by mouth 2 (two) times daily. (Patient not taking: Reported on 07/11/2022) 20 tablet 0   ibuprofen (ADVIL) 600 MG tablet Take 1 tablet (600 mg total) by mouth every 6 (six) hours as needed for up to 40 doses for fever or mild pain. (Patient not taking: Reported on 07/11/2022) 40 tablet 0   nicotine (NICODERM CQ) 21 mg/24hr patch Place 1 patch (21 mg total) onto the skin daily. (Patient not taking: Reported on 07/11/2022) 28 patch 1   rosuvastatin (CRESTOR) 20 MG tablet Take 20 mg by mouth at bedtime.  (Patient not taking: Reported on 07/11/2022)     traZODone (DESYREL) 50 MG tablet Take 1 tablet (50 mg total) by mouth at bedtime as needed for sleep. (Patient not taking: Reported on 07/11/2022) 30 tablet 0   No facility-administered medications prior to visit.     Past Medical History:  Diagnosis Date   Abscess    buttocks   Cellulitis    HIV positive (HCC)    MRSA infection      Past Surgical History:  Procedure Laterality Date   HAND / FINGER LESION EXCISION        Review of Systems  Constitutional:  Negative for appetite change, chills, fatigue, fever and unexpected weight change.  Eyes:  Negative for visual disturbance.  Respiratory:  Negative for cough, chest tightness, shortness of breath and wheezing.   Cardiovascular:  Negative for chest pain and leg swelling.  Gastrointestinal:  Negative for abdominal pain, constipation, diarrhea, nausea and vomiting.  Genitourinary:  Negative for dysuria, flank pain, frequency, genital sores, hematuria and urgency.  Skin:  Negative for rash.  Allergic/Immunologic: Negative for immunocompromised state.  Neurological:  Negative for dizziness and headaches.      Objective:    Pulse 92   Ht 6\' 6"  (1.981 m)   Wt 223 lb (101.2 kg)   SpO2 98%   BMI 25.77 kg/m  Nursing note and vital signs reviewed.  Physical Exam Constitutional:      General: He is not in acute distress.    Appearance: He  is well-developed.  Eyes:     Conjunctiva/sclera: Conjunctivae normal.  Cardiovascular:     Rate and Rhythm: Normal rate and regular rhythm.     Heart sounds: Normal heart sounds. No murmur heard.    No friction rub. No gallop.  Pulmonary:     Effort: Pulmonary effort is normal. No respiratory distress.     Breath sounds: Normal breath sounds. No wheezing or rales.  Chest:     Chest wall: No tenderness.  Abdominal:     General: Bowel sounds are normal.     Palpations: Abdomen is soft.     Tenderness: There is no abdominal  tenderness.  Musculoskeletal:     Cervical back: Neck supple.  Lymphadenopathy:     Cervical: No cervical adenopathy.  Skin:    General: Skin is warm and dry.     Findings: No rash.  Neurological:     Mental Status: He is alert and oriented to person, place, and time.  Psychiatric:        Behavior: Behavior normal.        Thought Content: Thought content normal.        Judgment: Judgment normal.         07/11/2022    2:58 PM 02/12/2021    8:42 AM 12/25/2020    9:47 AM 07/06/2020    3:45 PM 08/20/2019   10:15 AM  Depression screen PHQ 2/9  Decreased Interest 0 0 0 0 0  Down, Depressed, Hopeless 0 0 0 0 0  PHQ - 2 Score 0 0 0 0 0       Assessment & Plan:    Patient Active Problem List   Diagnosis Date Noted   Alcohol use 08/04/2022   Eustachian tube dysfunction 12/20/2019   Bilateral lower extremity pain 12/20/2019   Cough 04/30/2019   Healthcare maintenance 01/18/2019   Genital warts 06/27/2017   IVDU (intravenous drug user) 06/27/2017   Depression 06/27/2017   Hematochezia 03/26/2015   Dermatitis 06/10/2014   Dyslipidemia 04/14/2011   Blood in stool 03/20/2009   METHICILLIN RESISTANT STAPH AUREUS SEPTICEMIA 01/02/2009   DENTAL CARIES 01/02/2009   TOBACCO USER 05/27/2008   INSOMNIA, CHRONIC 05/27/2008   Human immunodeficiency virus (HIV) disease ([place]) 11/28/2006   SYPHILIS NOS 11/28/2006     Problem List Items Addressed This Visit       Other   Human immunodeficiency virus (HIV) disease ([place])    Gabriel [name] continues to have poorly controlled virus secondary to poor adherence to [name].  Genosure with no resistance to rilpivirine or interagrase inhibitors. Prior authorization pending.  Discussed importance of taking medication daily as prescribed and avoiding taking it sporadically as this is what leads to resistance.  Declines blood work.  Continue current dose of [name] while awaiting prior authorization.  Plan for follow-up pending prior authorization approval  of [name].       Alcohol use - Primary    Gabriel [name] sounds to have a considerate amount of daily alcohol use which is concerning that this is contributing to his poor adherence to medication. During visit he is worried about his renal and hepatic function. Recommended cutting back on alcohol to no more than 2 standard drinks per day. Discussed that resources are available should he need them.         I am having Gabriel [name] maintain his fexofenadine-pseudoephedrine, ibuprofen, nicotine, rosuvastatin, traZODone, and bictegravir-emtricitabine-tenofovir AF.    Follow-up: Pending prior authorization for [place], MSN,  FNP-C Nurse Practitioner [facility] [place] number: [phone]

## 2022-08-03 NOTE — Patient Instructions (Addendum)
Nice to see you.  Continue to take your medication daily as prescribed.  We will call you once your prior authorization results are available.   Plan for follow up pending medication.

## 2022-08-04 ENCOUNTER — Encounter: Payer: Self-pay | Admitting: Family

## 2022-08-04 DIAGNOSIS — Z789 Other specified health status: Secondary | ICD-10-CM | POA: Insufficient documentation

## 2022-08-04 NOTE — Assessment & Plan Note (Signed)
Gabriel [name] continues to have poorly controlled virus secondary to poor adherence to [place].  Genosure with no resistance to rilpivirine or interagrase inhibitors. Prior authorization pending.  Discussed importance of taking medication daily as prescribed and avoiding taking it sporadically as this is what leads to resistance.  Declines blood work.  Continue current dose of [name] while awaiting prior authorization.  Plan for follow-up pending prior authorization approval of [name].

## 2022-08-04 NOTE — Assessment & Plan Note (Signed)
Stormy sounds to have a considerate amount of daily alcohol use which is concerning that this is contributing to his poor adherence to medication. During visit he is worried about his renal and hepatic function. Recommended cutting back on alcohol to no more than 2 standard drinks per day. Discussed that resources are available should he need them.

## 2022-08-18 ENCOUNTER — Telehealth: Payer: Self-pay

## 2022-08-18 NOTE — Telephone Encounter (Signed)
[facility] Patient Advocate Encounter  Prior Authorization for [id number] & [id number] has been approved. Total of 7 visits.  (Medical Benefits)  PA# [id number] Effective dates: 08/01/22 through 08/02/23 REF # [id number] 08/18/2022 4:36 eastern standard.   Patient is enrolled in [facility] will continue to follow.  Ileene Patrick, [facility] Patient [facility] for Infectious Disease Phone: [phone] Fax:  [phone]

## 2022-08-22 ENCOUNTER — Telehealth: Payer: Self-pay

## 2022-08-22 NOTE — Telephone Encounter (Signed)
Counseled that [name] is two separate intramuscular injections in the gluteal muscle on each side for each visit. Explained that the second injection is 30 days after the initial injection then every 2 months thereafter. Discussed the rare but significant chance of developing resistance despite compliance. Explained that showing up to injection appointments is very important and warned that if 2 appointments are missed, it will be reassessed by their provider whether they are a good candidate for injection therapy. Counseled on possible side effects associated with the injections such as injection site pain, which is usually mild to moderate in nature, injection site nodules, and injection site reactions. Asked to call the clinic or send me a mychart message if they experience any issues, such as fatigue, nausea, headache, rash, or dizziness. Advised that they can take ibuprofen or tylenol for injection site pain if needed.   Eliseo Gum, PharmD PGY1 Pharmacy Resident   08/22/2022  2:04 PM

## 2022-08-22 NOTE — Telephone Encounter (Signed)
Cumulative HIV Genotype Data  Genotype Dates: 04/24/2014, 12/04/2007, 05/13/2008, 11/12/2013, 04/24/2014, 07/11/22  RT Mutations  V35IM, K49R, V60I, R83K, V90VI, G196E, Q207E, R211K, V245E, D250S, K281R, T286A, E297K, K311R, S322T, Q334H, K103N  PI Mutations  I13V, L63P, V77I  Integrase Mutations  G163T   Interpretation of Genotype Data per [employer] HIV Drug Resistance Database:  Nucleoside RTIs  Abacavir - susceptible Zidovudine - susceptible Emtricitabine - susceptible Lamivudine - susceptible Tenofovir - susceptible    Non-Nucleoside RTIs  Doravirine - susceptible Efavirenz - high-level resistance Etravirine - susceptible Nevirapine - high-level resistance Rilpivirine - susceptible   Protease Inhibitors  Atazanavir - susceptible Darunavir - susceptible Lopinavir - susceptible   Integrase Inhibitors  Bictegravir - susceptible Cabotegravir - susceptible Dolutegravir - susceptible Elvitegravir - susceptible Raltegravir - susceptible   Current HIV regimen of [name]. Patient's [name] recently approved. Based on cumulative genotype, okay to start [place].   Eliseo Gum, PharmD PGY1 Pharmacy Resident   08/22/2022  12:09 PM

## 2022-09-01 ENCOUNTER — Other Ambulatory Visit: Payer: Self-pay

## 2022-09-01 ENCOUNTER — Ambulatory Visit (INDEPENDENT_AMBULATORY_CARE_PROVIDER_SITE_OTHER): Payer: No Typology Code available for payment source | Admitting: Pharmacist

## 2022-09-01 DIAGNOSIS — B20 Human immunodeficiency virus [HIV] disease: Secondary | ICD-10-CM

## 2022-09-01 MED ORDER — CABOTEGRAVIR & RILPIVIRINE ER 600 & 900 MG/3ML IM SUER
1.0000 | Freq: Once | INTRAMUSCULAR | Status: AC
  Administered 2022-09-01: 1 via INTRAMUSCULAR

## 2022-09-01 NOTE — Progress Notes (Signed)
HPI: Gabriel [name] is a 46 y.o. male who presents to the [place] clinic for [name] administration.  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   FEXOFENADINE-PSEUDOEPHEDRINE (ALLEGRA-D) 60-120 MG 12 HR TABLET    Take 1 tablet by mouth 2 (two) times daily.   IBUPROFEN (ADVIL) 600 MG TABLET    Take 1 tablet (600 mg total) by mouth every 6 (six) hours as needed for up to 40 doses for fever or mild pain.   NICOTINE (NICODERM CQ) 21 MG/24HR PATCH    Place 1 patch (21 mg total) onto the skin daily.   ROSUVASTATIN (CRESTOR) 20 MG TABLET    Take 20 mg by mouth at bedtime.   TRAZODONE (DESYREL) 50 MG TABLET    Take 1 tablet (50 mg total) by mouth at bedtime as needed for sleep.  Modified Medications   No medications on file  Discontinued Medications   No medications on file    Allergies: Allergies  Allergen Reactions   Abacavir Other (See Comments)    HLA B5701 positive - should not receive abacavir due to risk of hypersensitivity    Past Medical History: Past Medical History:  Diagnosis Date   Abscess    buttocks   Cellulitis    HIV positive (HCC)    MRSA infection      Social History: Social History   Socioeconomic History   Marital status: Single    Spouse name: Not on file   Number of children: Not on file   Years of education: Not on file   Highest education level: Not on file  Occupational History   Not on file  Tobacco Use   Smoking status: Every Day    Packs/day: 1.00    Types: Cigarettes   Smokeless tobacco: Never  Vaping Use   Vaping Use: Never used  Substance and Sexual Activity   Alcohol use: Yes    Alcohol/week: 3.0 standard drinks of alcohol    Types: 3 Standard drinks or equivalent per week    Comment: socially    Drug use: No    Types: Cocaine, IV, Methamphetamines    Comment: only meth at this point   Sexual activity: Not Currently    Partners: Male    Comment: declined condoms  Other Topics Concern   Not on file  Social History Narrative   internet processor   Social Determinants of Health   Financial Resource Strain: Not on file  Food Insecurity: Not on file  Transportation Needs: Not on file  Physical Activity: Not on file  Stress: Not on file  Social Connections: Not on file    Labs: Lab Results  Component Value Date   HIV1RNAQUANT 38,500 (  H) 07/11/2022   HIV1RNAQUANT <20 (H) 05/06/2021   HIV1RNAQUANT <20 (H) 02/12/2021   CD4TABS 241 (L) 07/11/2022   CD4TABS 356 (L) 05/06/2021   CD4TABS 298 (L) 07/06/2020    RPR and STI Lab Results  Component Value Date   LABRPR REACTIVE (A) 07/11/2022   LABRPR REACTIVE (A) 12/25/2020   LABRPR REACTIVE (A) 07/06/2020   LABRPR REACTIVE (A) 12/20/2019   LABRPR REACTIVE (A) 08/20/2019   RPRTITER 1:1 (H) 07/11/2022   RPRTITER 1:2 (H) 12/25/2020   RPRTITER 1:4 (H) 07/06/2020   RPRTITER 1:2 (H) 12/20/2019   RPRTITER 1:2 (H) 08/20/2019    STI Results GC CT  06/27/2017 12:00 AM Negative    Negative    Negative  Negative    Negative    Negative   12/17/2014 12:00 AM NG: Negative  CT: Negative     Hepatitis B Lab Results  Component Value Date   HEPBSAB  Negative 10/10/2007   HEPBSAG Negative 10/10/2007   HEPBCAB POS (A) 11/10/2006   Hepatitis C No results found for: "HEPCAB", "HCVRNAPCRQN" Hepatitis A No results found for: "HAV" Lipids: Lab Results  Component Value Date   CHOL 169 07/11/2022   TRIG 941 (H) 07/11/2022   HDL 22 (L) 07/11/2022   CHOLHDL 7.7 (H) 07/11/2022   VLDL 53 (H) 04/24/2014   LDLCALC  07/11/2022     Comment:     . LDL cholesterol not calculated. Triglyceride levels greater than 400 mg/dL invalidate calculated LDL results. . Reference range: <100 . Desirable range <100 mg/dL for primary prevention;   <70 mg/dL for patients with CHD or diabetic patients  with > or = 2 CHD risk factors. Marland Kitchen LDL-C is now calculated using the Martin-Hopkins  calculation, which is a validated novel method providing  better accuracy than the Friedewald equation in the  estimation of LDL-C.  Cresenciano Genre et al. Annamaria Helling. 2836;629(47): 2061-2068  (http://education.[facility].com/faq/FAQ164)     Current HIV Regimen: [name]  TARGET DATE: The 7th  Assessment: Gabriel [name] presents today for his first initiation injection for [name]. Counseled that [name] is two separate intramuscular injections in the gluteal muscle on each side for each visit. Explained that the second injection is 30 days after the initial injection then every 2 months thereafter. Discussed the rare but significant chance of developing resistance despite compliance. Explained that showing up to injection appointments is very important and warned that if 2 appointments are missed, it will be reassessed by their provider whether they are a good candidate for injection therapy. Counseled on possible side effects associated with the injections such as injection site pain, which is usually mild to moderate in nature, injection site nodules, and injection site reactions. Asked to call the clinic or send me a mychart message if they experience any issues, such as fatigue,  nausea, headache, rash, or dizziness. Advised that they can take ibuprofen or tylenol for injection site pain if needed.   Administered cabotegravir 677m/3mL in left upper outer quadrant of the gluteal muscle. Administered rilpivirine 900 mg/333min the right upper outer quadrant of the gluteal muscle. Monitored patient for 10 minutes after injection. Injections were tolerated well without issue. Counseled to stop taking [name] after today's dose and to call with any issues that may arise. Will make follow up appointments for second initiation injection in 30 days and then maintenance injections every 2 months thereafter.   Plan: - Stop [name] after today's dose - First [name] injections administered - Second initiation injection scheduled for 09/28/22 with  me - Call with any issues or questions  Rodneisha Bonnet L. Jermey Closs, PharmD, BCIDP, AAHIVP, [place] Clinical Pharmacist Practitioner [facility] for [facility]

## 2022-09-28 ENCOUNTER — Ambulatory Visit (INDEPENDENT_AMBULATORY_CARE_PROVIDER_SITE_OTHER): Payer: No Typology Code available for payment source | Admitting: Pharmacist

## 2022-09-28 ENCOUNTER — Other Ambulatory Visit: Payer: Self-pay

## 2022-09-28 DIAGNOSIS — B20 Human immunodeficiency virus [HIV] disease: Secondary | ICD-10-CM

## 2022-09-28 MED ORDER — CABOTEGRAVIR & RILPIVIRINE ER 600 & 900 MG/3ML IM SUER
1.0000 | Freq: Once | INTRAMUSCULAR | Status: AC
  Administered 2022-09-28: 1 via INTRAMUSCULAR

## 2022-09-28 NOTE — Progress Notes (Signed)
HPI: Gabriel [name] is a 46 y.o. male who presents to the [place] clinic for [name] administration.  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   FEXOFENADINE-PSEUDOEPHEDRINE (ALLEGRA-D) 60-120 MG 12 HR TABLET    Take 1 tablet by mouth 2 (two) times daily.   IBUPROFEN (ADVIL) 600 MG TABLET    Take 1 tablet (600 mg total) by mouth every 6 (six) hours as needed for up to 40 doses for fever or mild pain.   NICOTINE (NICODERM CQ) 21 MG/24HR PATCH    Place 1 patch (21 mg total) onto the skin daily.   ROSUVASTATIN (CRESTOR) 20 MG TABLET    Take 20 mg by mouth at bedtime.   TRAZODONE (DESYREL) 50 MG TABLET    Take 1 tablet (50 mg total) by mouth at bedtime as needed for sleep.  Modified Medications   No medications on file  Discontinued Medications   No medications on file    Allergies: Allergies  Allergen Reactions   Abacavir Other (See Comments)    HLA B5701 positive - should not receive abacavir due to risk of hypersensitivity    Past Medical History: Past Medical History:  Diagnosis Date   Abscess    buttocks   Cellulitis    HIV positive (HCC)    MRSA infection      Social History: Social History   Socioeconomic History   Marital status: Single    Spouse name: Not on file   Number of children: Not on file   Years of education: Not on file   Highest education level: Not on file  Occupational History   Not on file  Tobacco Use   Smoking status: Every Day    Packs/day: 1.00    Types: Cigarettes   Smokeless tobacco: Never  Vaping Use   Vaping Use: Never used  Substance and Sexual Activity   Alcohol use: Yes    Alcohol/week: 3.0 standard drinks of alcohol    Types: 3 Standard drinks or equivalent per week    Comment: socially    Drug use: No    Types: Cocaine, IV, Methamphetamines    Comment: only meth at this point   Sexual activity: Not Currently    Partners: Male    Comment: declined condoms  Other Topics Concern   Not on file  Social History Narrative   internet processor   Social Determinants of Health   Financial Resource Strain: Not on file  Food Insecurity: Not on file  Transportation Needs: Not on file  Physical Activity: Not on file  Stress: Not on file  Social Connections: Not on file    Labs: Lab Results  Component Value Date   HIV1RNAQUANT 38,500 (  H) 07/11/2022   HIV1RNAQUANT <20 (H) 05/06/2021   HIV1RNAQUANT <20 (H) 02/12/2021   CD4TABS 241 (L) 07/11/2022   CD4TABS 356 (L) 05/06/2021   CD4TABS 298 (L) 07/06/2020    RPR and STI Lab Results  Component Value Date   LABRPR REACTIVE (A) 07/11/2022   LABRPR REACTIVE (A) 12/25/2020   LABRPR REACTIVE (A) 07/06/2020   LABRPR REACTIVE (A) 12/20/2019   LABRPR REACTIVE (A) 08/20/2019   RPRTITER 1:1 (H) 07/11/2022   RPRTITER 1:2 (H) 12/25/2020   RPRTITER 1:4 (H) 07/06/2020   RPRTITER 1:2 (H) 12/20/2019   RPRTITER 1:2 (H) 08/20/2019    STI Results GC CT  06/27/2017 12:00 AM Negative    Negative    Negative  Negative    Negative    Negative   12/17/2014 12:00 AM NG: Negative  CT: Negative     Hepatitis B Lab Results  Component Value Date   HEPBSAB  Negative 10/10/2007   HEPBSAG Negative 10/10/2007   HEPBCAB POS (A) 11/10/2006   Hepatitis C No results found for: "HEPCAB", "HCVRNAPCRQN" Hepatitis A No results found for: "HAV" Lipids: Lab Results  Component Value Date   CHOL 169 07/11/2022   TRIG 941 (H) 07/11/2022   HDL 22 (L) 07/11/2022   CHOLHDL 7.7 (H) 07/11/2022   VLDL 53 (H) 04/24/2014   LDLCALC  07/11/2022     Comment:     . LDL cholesterol not calculated. Triglyceride levels greater than 400 mg/dL invalidate calculated LDL results. . Reference range: <100 . Desirable range <100 mg/dL for primary prevention;   <70 mg/dL for patients with CHD or diabetic patients  with > or = 2 CHD risk factors. Marland Kitchen LDL-C is now calculated using the Martin-Hopkins  calculation, which is a validated novel method providing  better accuracy than the Friedewald equation in the  estimation of LDL-C.  Cresenciano Genre et al. Annamaria Helling. 6503;546(56): 2061-2068  (http://education.[facility].com/faq/FAQ164)     TARGET DATE: The 9th  Assessment: Edvardo presents today for his second round of initiation [name] injections. First set of injections were tolerated well without issues. He states that he had absolutely no pain at all.  Administered cabotegravir 664m/3mL in left upper outer quadrant of the gluteal muscle. Administered rilpivirine 900 mg/367min the right upper outer quadrant of the gluteal muscle. No issues with injections. He will follow up in 2 months for next set of injections.  GrMarya Amslers unavailable during his February and April injection windows. Will schedule with me again in February and have him see StColletta Marylandn April for HIV follow up. Will check a HIV RNA today to ensure he is back to being undetectable.   Plan: - [name] injections administered - HIV RNA today - Next injections scheduled for 11/29/22 with me and 01/30/23 with StColletta Maryland Call with any issues or questions  Millena Callins L. Basilia Stuckert, PharmD, BCIDP, AAHIVP,  CP[facility] Pointlinical Pharmacist Practitioner [facility]

## 2022-10-02 LAB — HIV-1 RNA QUANT-NO REFLEX-BLD
HIV 1 RNA Quant: 76 Copies/mL — ABNORMAL HIGH
HIV-1 RNA Quant, Log: 1.88 Log cps/mL — ABNORMAL HIGH

## 2022-10-04 ENCOUNTER — Other Ambulatory Visit: Payer: Self-pay | Admitting: Family

## 2022-10-04 ENCOUNTER — Encounter: Payer: Self-pay | Admitting: Pharmacist

## 2022-10-04 MED ORDER — TRAZODONE HCL 50 MG PO TABS: 50.0000 mg | ORAL_TABLET | Freq: Every evening | ORAL | 2 refills | Status: DC | PRN

## 2022-10-04 NOTE — Telephone Encounter (Signed)
From: Sullivan Lone To: Office of Jeanine Luz, [place] Sent: 10/04/2022 9:05 AM EST Subject: Medication Renewal Request  Refills have been requested for the following medications:   traZODone (DESYREL) 50 MG tablet [Sukanya Goldblatt]  Preferred pharmacy: [facility] - [place] ([place]), [place] - [address] Delivery method: [employer]

## 2022-11-29 ENCOUNTER — Ambulatory Visit: Payer: No Typology Code available for payment source | Admitting: Pharmacist

## 2022-11-29 ENCOUNTER — Other Ambulatory Visit: Payer: Self-pay

## 2022-11-29 DIAGNOSIS — B20 Human immunodeficiency virus [HIV] disease: Secondary | ICD-10-CM

## 2022-11-29 MED ORDER — CABOTEGRAVIR & RILPIVIRINE ER 600 & 900 MG/3ML IM SUER
1.0000 | Freq: Once | INTRAMUSCULAR | Status: AC
  Administered 2022-11-29: 1 via INTRAMUSCULAR

## 2022-11-29 NOTE — Progress Notes (Unsigned)
HPI: Gabriel [name] is a 47 y.o. male who presents to the [facility] for [place] administration.  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   FEXOFENADINE-PSEUDOEPHEDRINE (ALLEGRA-D) 60-120 MG 12 HR TABLET    Take 1 tablet by mouth 2 (two) times daily.   IBUPROFEN (ADVIL) 600 MG TABLET    Take 1 tablet (600 mg total) by mouth every 6 (six) hours as needed for up to 40 doses for fever or mild pain.   NICOTINE (NICODERM CQ) 21 MG/24HR PATCH    Place 1 patch (21 mg total) onto the skin daily.   ROSUVASTATIN (CRESTOR) 20 MG TABLET    Take 20 mg by mouth at bedtime.   TRAZODONE (DESYREL) 50 MG TABLET    Take 1 tablet (50 mg total) by mouth at bedtime as needed for sleep.  Modified Medications   No medications on file  Discontinued Medications   No medications on file    Allergies: Allergies  Allergen Reactions   Abacavir Other (See Comments)    HLA B5701 positive - should not receive abacavir due to risk of hypersensitivity    Past Medical History: Past Medical History:  Diagnosis Date   Abscess    buttocks   Cellulitis    HIV positive (HCC)    MRSA infection      Social History: Social History   Socioeconomic History   Marital status: Single    Spouse name: Not on file   Number of children: Not on file   Years of education: Not on file   Highest education level: Not on file  Occupational History   Not on file  Tobacco Use   Smoking status: Every Day    Packs/day: 1.00    Types: Cigarettes   Smokeless tobacco: Never  Vaping Use   Vaping Use: Never used  Substance and Sexual Activity   Alcohol use: Yes    Alcohol/week: 3.0 standard drinks of alcohol    Types: 3 Standard drinks or equivalent per week    Comment: socially    Drug use: No    Types: Cocaine, IV, Methamphetamines    Comment: only meth at this point   Sexual activity: Not Currently    Partners: Male    Comment: declined condoms  Other Topics Concern   Not on file  Social History Narrative   internet processor   Social Determinants of Health   Financial Resource Strain: Not on file  Food Insecurity: Not on file  Transportation Needs: Not on file  Physical Activity: Not on file  Stress: Not on file  Social Connections: Not on file    Labs: Lab Results  Component Value Date   HIV1RNAQUANT 76 (  H) 09/28/2022   HIV1RNAQUANT 38,500 (H) 07/11/2022   HIV1RNAQUANT <20 (H) 05/06/2021   CD4TABS 241 (L) 07/11/2022   CD4TABS 356 (L) 05/06/2021   CD4TABS 298 (L) 07/06/2020    RPR and STI Lab Results  Component Value Date   LABRPR REACTIVE (A) 07/11/2022   LABRPR REACTIVE (A) 12/25/2020   LABRPR REACTIVE (A) 07/06/2020   LABRPR REACTIVE (A) 12/20/2019   LABRPR REACTIVE (A) 08/20/2019   RPRTITER 1:1 (H) 07/11/2022   RPRTITER 1:2 (H) 12/25/2020   RPRTITER 1:4 (H) 07/06/2020   RPRTITER 1:2 (H) 12/20/2019   RPRTITER 1:2 (H) 08/20/2019    STI Results GC CT  06/27/2017 12:00 AM Negative    Negative    Negative  Negative    Negative    Negative   12/17/2014 12:00 AM NG: Negative  CT: Negative     Hepatitis B Lab Results  Component Value Date   HEPBSAB  Negative 10/10/2007   HEPBSAG Negative 10/10/2007   HEPBCAB POS (A) 11/10/2006   Hepatitis C No results found for: "HEPCAB", "HCVRNAPCRQN" Hepatitis A No results found for: "HAV" Lipids: Lab Results  Component Value Date   CHOL 169 07/11/2022   TRIG 941 (H) 07/11/2022   HDL 22 (L) 07/11/2022   CHOLHDL 7.7 (H) 07/11/2022   VLDL 53 (H) 04/24/2014   LDLCALC  07/11/2022     Comment:     . LDL cholesterol not calculated. Triglyceride levels greater than 400 mg/dL invalidate calculated LDL results. . Reference range: <100 . Desirable range <100 mg/dL for primary prevention;   <70 mg/dL for patients with CHD or diabetic patients  with > or = 2 CHD risk factors. Marland Kitchen LDL-C is now calculated using the Martin-Hopkins  calculation, which is a validated novel method providing  better accuracy than the Friedewald equation in the  estimation of LDL-C.  Cresenciano Genre et al. Annamaria Helling. 6546;503(54): 2061-2068  (http://education.[facility].com/faq/FAQ164)     TARGET DATE: 9th  Assessment: [name] presents today for 2 month maintenance [name] injections. Past injections were tolerated well without issues. Notes no new partners and politely declines STI testing today. He reports receiving both flu and COVID vaccination at [facility] for this season.  Collected HIV RNA and CD4 today. Administered cabotegravir 600mg /96mL in left upper outer quadrant of the gluteal muscle. Administered rilpivirine 900 mg/65mL in the right upper outer quadrant of the gluteal muscle. No issues with injections. He will follow up in 2 months for next set of injections.  Plan: - [name] injections administered - Next injections scheduled for 4/2 with Cassie - Call with any issues or questions  Titus Dubin, PharmD PGY1 Pharmacy Resident 11/30/2022 11:27 AM

## 2022-11-30 LAB — T-HELPER CELLS (CD4) COUNT (NOT AT ARMC)
CD4 % Helper T Cell: 23 % — ABNORMAL LOW (ref 33–65)
CD4 T Cell Abs: 491 /uL (ref 400–1790)

## 2022-12-01 LAB — HIV-1 RNA QUANT-NO REFLEX-BLD
HIV 1 RNA Quant: 35 Copies/mL — ABNORMAL HIGH
HIV-1 RNA Quant, Log: 1.54 Log cps/mL — ABNORMAL HIGH

## 2022-12-09 ENCOUNTER — Other Ambulatory Visit (HOSPITAL_COMMUNITY): Payer: Self-pay

## 2023-01-13 ENCOUNTER — Telehealth: Payer: No Typology Code available for payment source | Admitting: Family Medicine

## 2023-01-13 ENCOUNTER — Other Ambulatory Visit: Payer: Self-pay | Admitting: Family

## 2023-01-13 DIAGNOSIS — K0889 Other specified disorders of teeth and supporting structures: Secondary | ICD-10-CM

## 2023-01-13 MED ORDER — IBUPROFEN 600 MG PO TABS: 600.0000 mg | ORAL_TABLET | Freq: Four times a day (QID) | ORAL | 0 refills | Status: DC | PRN

## 2023-01-13 NOTE — Progress Notes (Signed)
Gabriel [name]   Refill request was approved by another provider.

## 2023-01-24 ENCOUNTER — Ambulatory Visit (INDEPENDENT_AMBULATORY_CARE_PROVIDER_SITE_OTHER): Payer: No Typology Code available for payment source | Admitting: Pharmacist

## 2023-01-24 ENCOUNTER — Other Ambulatory Visit: Payer: Self-pay

## 2023-01-24 DIAGNOSIS — B20 Human immunodeficiency virus [HIV] disease: Secondary | ICD-10-CM | POA: Diagnosis not present

## 2023-01-24 MED ORDER — CABOTEGRAVIR & RILPIVIRINE ER 600 & 900 MG/3ML IM SUER
1.0000 | Freq: Once | INTRAMUSCULAR | Status: AC
  Administered 2023-01-24: 1 via INTRAMUSCULAR

## 2023-01-24 NOTE — Progress Notes (Signed)
HPI: Gabriel [name] is a 47 y.o. male who presents to the [facility] for [place] administration.  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   FEXOFENADINE-PSEUDOEPHEDRINE (ALLEGRA-D) 60-120 MG 12 HR TABLET    Take 1 tablet by mouth 2 (two) times daily.   IBUPROFEN (ADVIL) 600 MG TABLET    Take 1 tablet (600 mg total) by mouth every 6 (six) hours as needed for up to 40 doses for fever or mild pain.   NICOTINE (NICODERM CQ) 21 MG/24HR PATCH    Place 1 patch (21 mg total) onto the skin daily.   ROSUVASTATIN (CRESTOR) 20 MG TABLET    Take 20 mg by mouth at bedtime.   TRAZODONE (DESYREL) 50 MG TABLET    Take 1 tablet (50 mg total) by mouth at bedtime as needed for sleep.  Modified Medications   No medications on file  Discontinued Medications   No medications on file    Allergies: Allergies  Allergen Reactions   Abacavir Other (See Comments)    HLA B5701 positive - should not receive abacavir due to risk of hypersensitivity    Past Medical History: Past Medical History:  Diagnosis Date   Abscess    buttocks   Cellulitis    HIV positive (HCC)    MRSA infection      Social History: Social History   Socioeconomic History   Marital status: Single    Spouse name: Not on file   Number of children: Not on file   Years of education: Not on file   Highest education level: Not on file  Occupational History   Not on file  Tobacco Use   Smoking status: Every Day    Packs/day: 1    Types: Cigarettes   Smokeless tobacco: Never  Vaping Use   Vaping Use: Never used  Substance and Sexual Activity   Alcohol use: Yes    Alcohol/week: 3.0 standard drinks of alcohol    Types: 3 Standard drinks or equivalent per week    Comment: socially    Drug use: No    Types: Cocaine, IV, Methamphetamines    Comment: only meth at this point   Sexual activity: Not Currently    Partners: Male    Comment: declined condoms  Other Topics Concern   Not on file  Social History Narrative   internet processor   Social Determinants of Health   Financial Resource Strain: Not on file  Food Insecurity: Not on file  Transportation Needs: Not on file  Physical Activity: Not on file  Stress: Not on file  Social Connections: Not on file    Labs: Lab Results  Component Value Date   HIV1RNAQUANT 35 (H)  11/29/2022   HIV1RNAQUANT 76 (H) 09/28/2022   HIV1RNAQUANT 38,500 (H) 07/11/2022   CD4TABS 491 11/29/2022   CD4TABS 241 (L) 07/11/2022   CD4TABS 356 (L) 05/06/2021    RPR and STI Lab Results  Component Value Date   LABRPR REACTIVE (A) 07/11/2022   LABRPR REACTIVE (A) 12/25/2020   LABRPR REACTIVE (A) 07/06/2020   LABRPR REACTIVE (A) 12/20/2019   LABRPR REACTIVE (A) 08/20/2019   RPRTITER 1:1 (H) 07/11/2022   RPRTITER 1:2 (H) 12/25/2020   RPRTITER 1:4 (H) 07/06/2020   RPRTITER 1:2 (H) 12/20/2019   RPRTITER 1:2 (H) 08/20/2019    STI Results GC CT  06/27/2017 12:00 AM Negative    Negative    Negative  Negative    Negative    Negative   12/17/2014 12:00 AM NG: Negative  CT: Negative     Hepatitis B Lab Results  Component Value Date   HEPBSAB Negative  10/10/2007   HEPBSAG Negative 10/10/2007   HEPBCAB POS (A) 11/10/2006   Hepatitis C No results found for: "HEPCAB", "HCVRNAPCRQN" Hepatitis A No results found for: "HAV" Lipids: Lab Results  Component Value Date   CHOL 169 07/11/2022   TRIG 941 (H) 07/11/2022   HDL 22 (L) 07/11/2022   CHOLHDL 7.7 (H) 07/11/2022   VLDL 53 (H) 04/24/2014   LDLCALC  07/11/2022     Comment:     . LDL cholesterol not calculated. Triglyceride levels greater than 400 mg/dL invalidate calculated LDL results. . Reference range: <100 . Desirable range <100 mg/dL for primary prevention;   <70 mg/dL for patients with CHD or diabetic patients  with > or = 2 CHD risk factors. Marland Kitchen LDL-C is now calculated using the Martin-Hopkins  calculation, which is a validated novel method providing  better accuracy than the Friedewald equation in the  estimation of LDL-C.  Cresenciano Genre et al. Annamaria Helling. [id number]): 2061-2068  (http://education.[facility].com/faq/FAQ164)     TARGET DATE: 9th of each month   Assessment: Thang presents today for his maintenance [name] injections. Past injections were tolerated well without issues. His viral load has been gradually trending down and his most recent viral load was 35 on 11/29/22, however it has remained detectable despite not missing any injections and having no documented resistance. As such, his viral load will be checked today. He also does not have documented immunity to hepatitis A or hepatitis B, but he has received vaccination for both hepatitis A and B. Serologies for hepatitis A and B will be checked this visit.   Administered cabotegravir 600mg /36mL in left upper outer quadrant of the gluteal muscle. Administered rilpivirine 900 mg/64mL in the right upper outer quadrant of the gluteal muscle. No issues with injections. He will follow up in 2 months for next set of injections.  I also discussed with Juel that he is due for a second meningococcal vaccine to complete  his meningococcal series. After discussion of the risks and benefits he wished to defer meningococcal vaccination for a future visit.   Plan: - [name] injections administered - Next injections scheduled for 04/03/23 with Marya Amsler and 05/29/23 with Cassie - Follow-up HIV RNA, HepB surface antibody and HepB surface antigen, HAV Ab - Call with any issues or questions  Adria Dill, PharmD PGY-2 Infectious Diseases Resident  01/24/2023 3:14 PM

## 2023-01-26 LAB — HEPATITIS B SURFACE ANTIGEN: Hepatitis B Surface Ag: NONREACTIVE

## 2023-01-26 LAB — HEPATITIS B SURFACE ANTIBODY,QUALITATIVE: Hep B S Ab: REACTIVE — AB

## 2023-01-26 LAB — HIV-1 RNA QUANT-NO REFLEX-BLD
HIV 1 RNA Quant: <20 Copies/mL — ABNORMAL HIGH
HIV-1 RNA Quant, Log: <1.3 Log cps/mL — ABNORMAL HIGH

## 2023-01-26 LAB — HEPATITIS A ANTIBODY, TOTAL: Hepatitis A AB,Total: REACTIVE — AB

## 2023-01-30 ENCOUNTER — Encounter: Payer: No Typology Code available for payment source | Admitting: Infectious Diseases

## 2023-02-16 ENCOUNTER — Emergency Department
Admission: EM | Admit: 2023-02-16 | Discharge: 2023-02-16 | Disposition: A | Discharge status: Home / Self Care | Payer: No Typology Code available for payment source | Attending: Emergency Medicine | Admitting: Emergency Medicine

## 2023-02-16 ENCOUNTER — Other Ambulatory Visit: Payer: Self-pay

## 2023-02-16 DIAGNOSIS — S41102A Unspecified open wound of left upper arm, initial encounter: Secondary | ICD-10-CM

## 2023-02-16 DIAGNOSIS — S51812A Laceration without foreign body of left forearm, initial encounter: Secondary | ICD-10-CM | POA: Diagnosis present

## 2023-02-16 DIAGNOSIS — X58XXXA Exposure to other specified factors, initial encounter: Secondary | ICD-10-CM | POA: Insufficient documentation

## 2023-02-16 DIAGNOSIS — Z21 Asymptomatic human immunodeficiency virus [HIV] infection status: Secondary | ICD-10-CM | POA: Diagnosis not present

## 2023-02-16 DIAGNOSIS — S51802A Unspecified open wound of left forearm, initial encounter: Secondary | ICD-10-CM | POA: Insufficient documentation

## 2023-02-16 MED ORDER — OXYCODONE-ACETAMINOPHEN 5-325 MG PO TABS
1.0000 | ORAL_TABLET | Freq: Once | ORAL | Status: AC
  Administered 2023-02-16: 1 via ORAL
  Filled 2023-02-16: qty 1

## 2023-02-16 MED ORDER — ONDANSETRON 4 MG PO TBDP: 4.0000 mg | ORAL_TABLET | Freq: Three times a day (TID) | ORAL | 0 refills | Status: AC | PRN

## 2023-02-16 MED ORDER — ONDANSETRON 4 MG PO TBDP
4.0000 mg | ORAL_TABLET | Freq: Once | ORAL | Status: AC
  Administered 2023-02-16: 4 mg via ORAL
  Filled 2023-02-16: qty 1

## 2023-02-16 MED ORDER — OXYCODONE-ACETAMINOPHEN 5-325 MG PO TABS: 1.0000 | ORAL_TABLET | Freq: Four times a day (QID) | ORAL | 0 refills | Status: AC | PRN

## 2023-02-16 NOTE — Discharge Instructions (Addendum)
Continue Keflex as directed. Please make follow up appointment with Dr. Stephenie Acres.  You have been prescribed a short course of Roxicet for pain. Please take Zofran with Oxycodone to avoid nausea.

## 2023-02-16 NOTE — ED Triage Notes (Signed)
Pt to ED from [facility] for laceration to left forearm, concern for tendon repair needed. Bleeding controlled. NAD noted.

## 2023-02-16 NOTE — ED Provider Notes (Signed)
[facility] Provider Note  Patient Contact: 9:14 PM (approximate)   History   Laceration   HPI  Gabriel [name] is a 47 y.o. male with a history of HIV, presents to the emergency department as referred by urgent care for a left volar wrist laceration that was supposedly self-inflicted accidental.  Patient adamantly denies suicidal or homicidal ideation.  Laceration was closed with minimal sutures while at the urgent care and was prescribed Keflex.  Patient's tetanus status is up-to-date.  Patient states that tendon could be visualized during laceration repair urgent care.      Physical Exam   Triage Vital Signs: ED Triage Vitals  Enc Vitals Group     BP 02/16/23 1831 (!) 131/93     Pulse Rate 02/16/23 1831 95     Resp 02/16/23 1831 16     Temp 02/16/23 1831 97.7 F (36.5 C)     Temp src --      SpO2 02/16/23 1831 99 %     Weight 02/16/23 1832 221 lb (100.2 kg)     Height 02/16/23 1832  (1.981 m)     Head Circumference --      Peak Flow --      Pain Score 02/16/23 1832 0     Pain Loc --      Pain Edu? --      Excl. in GC? --     Most recent vital signs: Vitals:   02/16/23 1831  BP: (!) 131/93  Pulse: 95  Resp: 16  Temp: 97.7 F (36.5 C)  SpO2: 99%     General: Alert and in no acute distress. Eyes:  PERRL. EOMI. Head: No acute traumatic findings ENT:      Nose: No congestion/rhinnorhea.      Mouth/Throat: Mucous membranes are moist. Neck: No stridor. No cervical spine tenderness to palpation. Cardiovascular:  Good peripheral perfusion Respiratory: Normal respiratory effort without tachypnea or retractions. Lungs CTAB. Good air entry to the bases with no decreased or absent breath sounds. Gastrointestinal: Bowel sounds 4 quadrants. Soft and nontender to palpation. No guarding or rigidity. No palpable masses. No distention. No CVA tenderness. Musculoskeletal: Full range of motion to all extremities.  Patient has a 6 cm loosely  reapproximated laceration along the volar aspect of left wrist. Neurologic:  No gross focal neurologic deficits are appreciated.  Skin:   No rash noted    ED Results / Procedures / Treatments   Labs (all labs ordered are listed, but only abnormal results are displayed) Labs Reviewed - No data to display     PROCEDURES:  Critical Care performed: No  Procedures   MEDICATIONS ORDERED IN ED: Medications  oxyCODONE-acetaminophen (PERCOCET/ROXICET) 5-325 MG per tablet 1 tablet (has no administration in time range)  ondansetron (ZOFRAN-ODT) disintegrating tablet 4 mg (has no administration in time range)     IMPRESSION / MDM / ASSESSMENT AND PLAN / ED COURSE  I reviewed the triage vital signs and the nursing notes.                              Assessment and plan Laceration with tendon injury 47 year old male with past medical history as detailed above, presents to the emergency department with a 6 cm laceration that was reapproximated at urgent care and was prescribed Keflex.  Patient assured me that wound was copiously irrigated prior to closure.  Explained to patient that he  would need outpatient follow-up with hand specialist, Dr. Stephenie Acres.  He was started on Percocet for pain and and advised to initiate Keflex as recommended by urgent care.  Patient adamantly denies suicidal or homicidal ideation.  Return precautions were given to return with new or worsening symptoms.     FINAL CLINICAL IMPRESSION(S) / ED DIAGNOSES   Final diagnoses:  Laceration of left forearm, initial encounter  Open wound of left upper extremity with tendon injury, initial encounter     Rx / DC Orders   ED Discharge Orders          Ordered    oxyCODONE-acetaminophen (PERCOCET/ROXICET) 5-325 MG tablet  Every 6 hours PRN        02/16/23 2111    ondansetron (ZOFRAN-ODT) 4 MG disintegrating tablet  Every 8 hours PRN        02/16/23 2111             Note:  This document was prepared using  Dragon voice recognition software and may include unintentional dictation errors.   Pia Mau Rotan, Cordelia Poche 02/16/23 2118    Corena Herter, MD 02/16/23 2329

## 2023-04-03 ENCOUNTER — Ambulatory Visit (INDEPENDENT_AMBULATORY_CARE_PROVIDER_SITE_OTHER): Payer: No Typology Code available for payment source | Admitting: Family

## 2023-04-03 ENCOUNTER — Other Ambulatory Visit: Payer: Self-pay

## 2023-04-03 ENCOUNTER — Encounter: Payer: Self-pay | Admitting: Family

## 2023-04-03 VITALS — BP 116/79 | HR 83 | Temp 97.6°F | Ht 78.0 in | Wt 228.0 lb

## 2023-04-03 DIAGNOSIS — Z0000 Encounter for general adult medical examination without abnormal findings: Secondary | ICD-10-CM

## 2023-04-03 DIAGNOSIS — B20 Human immunodeficiency virus [HIV] disease: Secondary | ICD-10-CM

## 2023-04-03 DIAGNOSIS — F17200 Nicotine dependence, unspecified, uncomplicated: Secondary | ICD-10-CM

## 2023-04-03 DIAGNOSIS — F17210 Nicotine dependence, cigarettes, uncomplicated: Secondary | ICD-10-CM

## 2023-04-03 DIAGNOSIS — A539 Syphilis, unspecified: Secondary | ICD-10-CM

## 2023-04-03 DIAGNOSIS — Z79899 Other long term (current) drug therapy: Secondary | ICD-10-CM

## 2023-04-03 DIAGNOSIS — L989 Disorder of the skin and subcutaneous tissue, unspecified: Secondary | ICD-10-CM | POA: Diagnosis not present

## 2023-04-03 MED ORDER — CABOTEGRAVIR & RILPIVIRINE ER 600 & 900 MG/3ML IM SUER
1.0000 | Freq: Once | INTRAMUSCULAR | Status: AC
Start: 2023-04-03 — End: 2023-04-03
  Administered 2023-04-03: 1 via INTRAMUSCULAR

## 2023-04-03 MED ORDER — AZITHROMYCIN 500 MG PO TABS: 500.0000 mg | ORAL_TABLET | Freq: Every day | ORAL | 0 refills | Status: DC

## 2023-04-03 MED ORDER — ROSUVASTATIN CALCIUM 20 MG PO TABS: 20.0000 mg | ORAL_TABLET | Freq: Every day | ORAL | 5 refills | Status: DC

## 2023-04-03 NOTE — Assessment & Plan Note (Signed)
Gabriel [name] continues to smoke tobacco daily.  Discussed importance of tobacco cessation to reduce risk of cardiovascular, renal, respiratory, malignant disease in the future.  He is in the precontemplation stage and not ready to quit at this time.

## 2023-04-03 NOTE — Assessment & Plan Note (Signed)
Previous RPR decreased to 1:1. Check RPR.

## 2023-04-03 NOTE — Assessment & Plan Note (Signed)
Devlin has well-controlled virus with good adherence and tolerance to [name].  Reviewed previous lab work and discussed plan of care and U equals U.  Injection provided with no complication.  Check lab work.  Plan for follow-up in 2 months with pharmacy provider and 6 months with NP/MD.

## 2023-04-03 NOTE — Assessment & Plan Note (Signed)
Gabriel [name] has skin lesion located on the posterior aspect of left mid calf that appears consistent with a wart, however malignancy or other pathology cannot be excluded. Will refer to dermatology for further evaluation and treatment.

## 2023-04-03 NOTE — Patient Instructions (Addendum)
Nice to see you.  We will check your lab work today.  Continue to take your medication daily as prescribed.  Refills have been sent to the pharmacy..   Plan for follow up in 2 months with pharmacy provider and 6 months with MD/NP  Have a great day and stay safe!  [facility] Located in: [facility] [address] Address: [address], [place], [place] [address] Hours:  Open ? Closes 5?PM Phone: [phone]

## 2023-04-03 NOTE — Assessment & Plan Note (Signed)
Discussed importance of safe sexual practice and condom use. Condoms and STD testing offered.  [place] contact info provided in AVS for colon cancer screening.  Declines vaccinations.  Routine dental care up to date.

## 2023-04-03 NOTE — Progress Notes (Signed)
Brief Narrative   Patient ID: ORAS SPLITT, male    DOB: 10/19/1976, 47 y.o.   MRN: [id number]  Mr. Douds is a 47 y/o AA male diagnosed with HIV disease in January 2008 with risk factor of MSM. Initial viral load of 96,900 and CD4 count 160. Genosure with K103N (efavirenz, nevirapine) medication resistance. Entered care at [facility] Stage 3. Previous ART experience with Prezista/ritonavir, Prezcobix, Truvada, Descovy, and [name].   Subjective:    Chief Complaint  Patient presents with   Follow-up   HIV Positive/AIDS    HPI:  KAZEEM ZAMBITO is a 47 y.o. male with HIV disease last seen on 01/24/23 by Aggie Cosier, PharmD, CPP with good adherence and tolerance to [name]. Viral load was undetectable and CD4 count 491. Here today for follow up.  Korrey has been doing well since last office visit and has returned from the [place] where he spent his birthday. Tolerating [name] with no adverse side effects. Has concerns about continued diarrhea and would like to be treated for travelers' diarrhea and also has a skin lesion located on his left calf that has been present for the past 8 months and has increased in size since initial notice. No treatment but has picked at it on occasion and will bleed some. Requesting referral to dermatology. Condoms and STD testing offered. Healthcare maintenance due includes colon cancer screening,  Menveo, pneumococcal vaccination in December.   Denies fevers, chills, night sweats, headaches, changes in vision, neck pain/stiffness, nausea, diarrhea, vomiting, lesions or rashes.  Allergies  Allergen Reactions   Abacavir Other (See Comments)    HLA B5701 positive - should not receive abacavir due to risk of hypersensitivity      Outpatient Medications Prior to Visit  Medication Sig Dispense Refill   traZODone (DESYREL) 50 MG tablet Take 1 tablet (50 mg total) by mouth at bedtime as needed for sleep. 30 tablet 2   fexofenadine-pseudoephedrine  (ALLEGRA-D) 60-120 MG 12 hr tablet Take 1 tablet by mouth 2 (two) times daily. (Patient not taking: Reported on 07/11/2022) 20 tablet 0   ibuprofen (ADVIL) 600 MG tablet Take 1 tablet (600 mg total) by mouth every 6 (six) hours as needed for up to 40 doses for fever or mild pain. 40 tablet 0   bictegravir-emtricitabine-tenofovir AF ([name]) 50-200-25 MG TABS tablet Take 1 tablet by mouth daily. (Patient not taking: Reported on 04/03/2023) 30 tablet 3   nicotine (NICODERM CQ) 21 mg/24hr patch Place 1 patch (21 mg total) onto the skin daily. (Patient not taking: Reported on 07/11/2022) 28 patch 1   rosuvastatin (CRESTOR) 20 MG tablet Take 20 mg by mouth at bedtime. (Patient not taking: Reported on 07/11/2022)     No facility-administered medications prior to visit.     Past Medical History:  Diagnosis Date   Abscess    buttocks   Cellulitis    HIV positive (HCC)    MRSA infection      Past Surgical History:  Procedure Laterality Date   HAND / FINGER LESION EXCISION        Review of Systems  Constitutional:  Negative for chills, diaphoresis, fatigue and fever.  Respiratory:  Negative for cough, chest tightness, shortness of breath and wheezing.   Cardiovascular:  Negative for chest pain.  Gastrointestinal:  Negative for abdominal pain, diarrhea, nausea and vomiting.      Objective:    BP 116/79   Pulse 83   Temp 97.6 F (36.4 C) (Oral)  Ht 6\' 6"  (1.981 m)   Wt 228 lb (103.4 kg)   SpO2 98%   BMI 26.35 kg/m  Nursing note and vital signs reviewed.  Physical Exam Constitutional:      General: He is not in acute distress.    Appearance: He is well-developed.  Eyes:     Conjunctiva/sclera: Conjunctivae normal.  Cardiovascular:     Rate and Rhythm: Normal rate and regular rhythm.     Heart sounds: Normal heart sounds. No murmur heard.    No friction rub. No gallop.  Pulmonary:     Effort: Pulmonary effort is normal. No respiratory distress.     Breath sounds: Normal  breath sounds. No wheezing or rales.  Chest:     Chest wall: No tenderness.  Abdominal:     General: Bowel sounds are normal.     Palpations: Abdomen is soft.     Tenderness: There is no abdominal tenderness.  Musculoskeletal:     Cervical back: Neck supple.  Lymphadenopathy:     Cervical: No cervical adenopathy.  Skin:    General: Skin is warm and dry.     Findings: Lesion (Located left calf, dome shape with califlower like appears; firm) present. No rash.  Neurological:     Mental Status: He is alert and oriented to person, place, and time.  Psychiatric:        Behavior: Behavior normal.        Thought Content: Thought content normal.        Judgment: Judgment normal.          04/03/2023    3:19 PM 07/11/2022    2:58 PM 02/12/2021    8:42 AM 12/25/2020    9:47 AM 07/06/2020    3:45 PM  Depression screen PHQ 2/9  Decreased Interest 0 0 0 0 0  Down, Depressed, Hopeless 0 0 0 0 0  PHQ - 2 Score 0 0 0 0 0       Assessment & Plan:    Patient Active Problem List   Diagnosis Date Noted   Skin lesion 04/03/2023   Alcohol use 08/04/2022   Eustachian tube dysfunction 12/20/2019   Bilateral lower extremity pain 12/20/2019   Cough 04/30/2019   Healthcare maintenance 01/18/2019   Genital warts 06/27/2017   IVDU (intravenous drug user) 06/27/2017   Depression 06/27/2017   Hematochezia 03/26/2015   Dermatitis 06/10/2014   Dyslipidemia 04/14/2011   Blood in stool 03/20/2009   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 Will refer to dermatology for further evaluation and treatment.       Relevant Orders   Ambulatory referral to Dermatology     Other   Human immunodeficiency virus (HIV) disease (HCC)    Tabb has well-controlled virus with good adherence and tolerance to [place].  Reviewed previous lab work and discussed plan of care and U equals U.  Injection provided with no complication.  Check lab work.  Plan for follow-up in 2 months with pharmacy provider and 6  months with NP/MD.      Relevant Medications   azithromycin (ZITHROMAX) 500 MG tablet   Other Relevant Orders   COMPLETE METABOLIC PANEL WITH GFR   HIV-1 RNA quant-no reflex-bld   T-helper cell (CD4)- ([facility] clinic only)   SYPHILIS NOS    Previous RPR decreased to 1:1. Check RPR.       Relevant Medications   azithromycin (ZITHROMAX) 500 MG tablet   Other Relevant Orders   RPR   TOBACCO USER    Coady continues to smoke tobacco daily.  Discussed importance of tobacco cessation to reduce risk of cardiovascular, renal, respiratory, malignant disease in the future.  He is in the precontemplation stage and not ready to quit at this time.      Healthcare maintenance    Discussed importance of safe sexual practice and condom use. Condoms and STD testing offered.   contact info provided in AVS for colon cancer screening.  Declines vaccinations.  Routine dental care up to date.       Other Visit Diagnoses     Pharmacologic therapy       Relevant Orders   Lipid panel        I have discontinued Caryn Bee L. Phillis's nicotine and bictegravir-emtricitabine-tenofovir AF. I have also changed his rosuvastatin. Additionally, I am having him start on azithromycin. Lastly, I am having him maintain his fexofenadine-pseudoephedrine, traZODone, and ibuprofen. We administered cabotegravir & rilpivirine ER.   Meds ordered this encounter  Medications   azithromycin (ZITHROMAX) 500 MG tablet    Sig: Take 1 tablet (500 mg total) by  mouth daily.    Dispense:  3 tablet    Refill:  0    Order Specific Question:   Supervising Provider    Answer:   Drue Second, CYNTHIA [[id number]]   rosuvastatin (CRESTOR) 20 MG tablet    Sig: Take 1 tablet (20 mg total) by mouth at bedtime.    Dispense:  30 tablet    Refill:  5    Order Specific Question:   Supervising Provider    Answer:   Drue Second, CYNTHIA [[id number]]   cabotegravir & rilpivirine ER ([name]) 600 & 900 MG/3ML injection 1 kit     Follow-up: Return in about 2 months (around 06/03/2023).   Marcos Eke, MSN, FNP-C Nurse Practitioner [employer] for Infectious Disease [facility] Main number: [phone]

## 2023-04-04 LAB — T-HELPER CELL (CD4) - (RCID CLINIC ONLY)
CD4 % Helper T Cell: 27 % — ABNORMAL LOW (ref 33–65)
CD4 T Cell Abs: 542 /uL (ref 400–1790)

## 2023-04-04 LAB — RPR TITER: RPR Titer: 1:1 {titer} — ABNORMAL HIGH

## 2023-04-04 LAB — LIPID PANEL: Non-HDL Cholesterol (Calc): 193 mg/dL (calc) — ABNORMAL HIGH (ref <130)

## 2023-04-04 LAB — COMPLETE METABOLIC PANEL WITH GFR
AG Ratio: 1.6 (calc) (ref 1.0–2.5)
AST: 15 U/L (ref 10–40)
Alkaline phosphatase (APISO): 74 U/L (ref 36–130)
BUN: 16 mg/dL (ref 7–25)
CO2: 23 mmol/L (ref 20–32)
Calcium: 9.3 mg/dL (ref 8.6–10.3)
Potassium: 3.6 mmol/L (ref 3.5–5.3)
Sodium: 140 mmol/L (ref 135–146)
eGFR: 107 mL/min/{1.73_m2} (ref >=60)

## 2023-04-06 LAB — LIPID PANEL
Cholesterol: 235 mg/dL — ABNORMAL HIGH (ref <200)
HDL: 42 mg/dL (ref >=40)
Total CHOL/HDL Ratio: 5.6 (calc) — ABNORMAL HIGH (ref <5.0)
Triglycerides: 843 mg/dL — ABNORMAL HIGH (ref <150)

## 2023-04-06 LAB — COMPLETE METABOLIC PANEL WITH GFR
ALT: 16 U/L (ref 9–46)
Albumin: 4.5 g/dL (ref 3.6–5.1)
Chloride: 107 mmol/L (ref 98–110)
Creat: 0.88 mg/dL (ref 0.60–1.29)
Globulin: 2.9 g/dL (calc) (ref 1.9–3.7)
Glucose, Bld: 80 mg/dL (ref 65–99)
Total Bilirubin: 0.4 mg/dL (ref 0.2–1.2)
Total Protein: 7.4 g/dL (ref 6.1–8.1)

## 2023-04-06 LAB — T PALLIDUM AB: T Pallidum Abs: POSITIVE — AB

## 2023-04-06 LAB — RPR: RPR Ser Ql: REACTIVE — AB

## 2023-04-06 LAB — HIV-1 RNA QUANT-NO REFLEX-BLD
HIV 1 RNA Quant: 32 Copies/mL — ABNORMAL HIGH
HIV-1 RNA Quant, Log: 1.5 Log cps/mL — ABNORMAL HIGH

## 2023-05-29 ENCOUNTER — Ambulatory Visit: Payer: No Typology Code available for payment source | Admitting: Family

## 2023-05-29 ENCOUNTER — Encounter: Payer: Self-pay | Admitting: Family

## 2023-05-29 ENCOUNTER — Other Ambulatory Visit: Payer: Self-pay

## 2023-05-29 VITALS — BP 114/77 | HR 93 | Temp 97.3°F | Ht 78.0 in | Wt 235.0 lb

## 2023-05-29 DIAGNOSIS — F17200 Nicotine dependence, unspecified, uncomplicated: Secondary | ICD-10-CM

## 2023-05-29 DIAGNOSIS — F17210 Nicotine dependence, cigarettes, uncomplicated: Secondary | ICD-10-CM | POA: Diagnosis not present

## 2023-05-29 DIAGNOSIS — Z79899 Other long term (current) drug therapy: Secondary | ICD-10-CM

## 2023-05-29 DIAGNOSIS — Z789 Other specified health status: Secondary | ICD-10-CM

## 2023-05-29 DIAGNOSIS — B20 Human immunodeficiency virus [HIV] disease: Secondary | ICD-10-CM

## 2023-05-29 DIAGNOSIS — Z0000 Encounter for general adult medical examination without abnormal findings: Secondary | ICD-10-CM

## 2023-05-29 MED ORDER — CABOTEGRAVIR & RILPIVIRINE ER 600 & 900 MG/3ML IM SUER
1.0000 | Freq: Once | INTRAMUSCULAR | Status: AC
Start: 2023-05-29 — End: 2023-05-29
  Administered 2023-05-29: 1 via INTRAMUSCULAR

## 2023-05-29 MED ORDER — ROSUVASTATIN CALCIUM 20 MG PO TABS: 20.0000 mg | ORAL_TABLET | Freq: Every day | ORAL | 5 refills | Status: DC

## 2023-05-29 MED ORDER — TRAZODONE HCL 50 MG PO TABS: 50.0000 mg | ORAL_TABLET | Freq: Every evening | ORAL | 5 refills | Status: DC | PRN

## 2023-05-29 NOTE — Patient Instructions (Signed)
Nice to see you.  We will check your lab work today.  Continue to take your medication daily as prescribed.  Refills have been sent to the pharmacy.  Plan for follow up in 2 months or sooner if needed with lab work on the same day.  Have a great day and stay safe!  

## 2023-05-29 NOTE — Progress Notes (Unsigned)
Brief Narrative   Patient ID: Gabriel [name], male    DOB: 03/03/1976, 47 y.o.   MRN: [id number]  Gabriel [name] is a 47 y/o AA male diagnosed with HIV disease in January 2008 with risk factor of MSM. Initial viral load of 96,900 and CD4 count 160. Genosure with K103N (efavirenz, nevirapine) medication resistance. Entered care at [facility] Stage 3. Previous ART experience with Prezista/ritonavir, Prezcobix, Truvada, Descovy, and [name].   Subjective:    No chief complaint on file.   HPI:  Gabriel [name] is a 47 y.o. male with HIV disease last seen on 04/03/23 with well controlled virus and good adhernece    Allergies  Allergen Reactions  . Abacavir Other (See Comments)    HLA B5701 positive - should not receive abacavir due to risk of hypersensitivity      Outpatient Medications Prior to Visit  Medication Sig Dispense Refill  . fexofenadine-pseudoephedrine (ALLEGRA-D) 60-120 MG 12 hr tablet Take 1 tablet by mouth 2 (two) times daily. 20 tablet 0  . ibuprofen (ADVIL) 600 MG tablet Take 1 tablet (600 mg total) by mouth every 6 (six) hours as needed for up to 40 doses for fever or mild pain. 40 tablet 0  . rosuvastatin (CRESTOR) 20 MG tablet Take 1 tablet (20 mg total) by mouth at bedtime. 30 tablet 5  . traZODone (DESYREL) 50 MG tablet Take 1 tablet (50 mg total) by mouth at bedtime as needed for sleep. 30 tablet 2  . azithromycin (ZITHROMAX) 500 MG tablet Take 1 tablet (500 mg total) by mouth daily. (Patient not taking: Reported on 05/29/2023) 3 tablet 0   No facility-administered medications prior to visit.     Past Medical History:  Diagnosis Date  . Abscess    buttocks  . Cellulitis   . HIV positive (HCC)   . MRSA infection      Past Surgical History:  Procedure Laterality Date  . HAND / FINGER LESION EXCISION        Review of Systems    Objective:    BP 114/77   Pulse 93   Temp (!) 97.3 F (36.3 C) (Temporal)   Ht 6\' 6"  (1.981 m)   Wt 235 lb (106.6 kg)   BMI  27.16 kg/m  Nursing note and vital signs reviewed.  Physical Exam      05/29/2023    2:51 PM 04/03/2023    3:19 PM 07/11/2022    2:58 PM 02/12/2021    8:42 AM 12/25/2020    9:47 AM  Depression screen PHQ 2/9  Decreased Interest 0 0 0 0 0  Down, Depressed, Hopeless 0 0 0 0 0  PHQ - 2 Score 0 0 0 0 0       Assessment & Plan:    Patient Active Problem List   Diagnosis Date Noted  . Skin lesion 04/03/2023  . Alcohol use 08/04/2022  . Eustachian tube dysfunction 12/20/2019  . Bilateral lower extremity pain 12/20/2019  . Cough 04/30/2019  . Healthcare maintenance 01/18/2019  . Genital warts 06/27/2017  . IVDU (intravenous drug user) 06/27/2017  . Depression 06/27/2017  . Hematochezia 03/26/2015  . Dermatitis 06/10/2014  . Dyslipidemia 04/14/2011  . Blood in stool 03/20/2009  . 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 DENTAL CARIES 01/02/2009  . TOBACCO USER 05/27/2008  . INSOMNIA, CHRONIC 05/27/2008  . Human immunodeficiency virus (HIV) disease (HCC) 11/28/2006  . SYPHILIS NOS 11/28/2006     Problem List Items Addressed  This Visit   None Visit Diagnoses     Human immunodeficiency virus (HIV) disease (HCC)    -  Primary   Relevant Medications   cabotegravir & rilpivirine ER ([name]) 600 & 900 MG/3ML injection 1 kit (Completed)        I have discontinued Gabriel [name]'s azithromycin. I am also having him maintain his fexofenadine-pseudoephedrine, traZODone, ibuprofen, and rosuvastatin. We administered cabotegravir & rilpivirine ER.   Meds ordered this encounter  Medications  . cabotegravir & rilpivirine ER ([name]) 600 & 900 MG/3ML injection 1 kit     Follow-up: No follow-ups on file.   Marcos Eke, MSN, FNP-C Nurse Practitioner [facility] for Infectious Disease [facility] Main number: [phone]

## 2023-05-30 ENCOUNTER — Encounter: Payer: Self-pay | Admitting: Family

## 2023-05-30 NOTE — Assessment & Plan Note (Signed)
Discussed importance of safe sexual practice and condom use. Condoms and STD testing offered.  Declines vaccinations.  

## 2023-05-30 NOTE — Assessment & Plan Note (Signed)
Gabriel [name] continues to have well controlled virus with good adherence and tolerance to [place]. Reviewed previous lab work and discussed plan of care. Check viral load. [name] injection provided with no adverse side effects. Next appointment scheduled. Plan for follow up in 2 months or sooner if needed.

## 2023-05-30 NOTE — Assessment & Plan Note (Signed)
Gabriel [name] continues to smoke tobacco daily. Discussed importance of tobacco cessation to reduce risk of malignant, respiratory, cardiovascular and renal disease in the future. In the pre-contemplation stage and not ready to quit at this time.

## 2023-05-30 NOTE — Assessment & Plan Note (Signed)
Gabriel [name] continues to consume alcohol at an increased frequency concerning for underlying problem. Discussed recommendation for no more than 2 alcoholic drinks per day.

## 2023-06-12 ENCOUNTER — Emergency Department
Admission: EM | Admit: 2023-06-12 | Discharge: 2023-06-12 | Disposition: A | Discharge status: Home / Self Care | Payer: No Typology Code available for payment source | Attending: Emergency Medicine | Admitting: Emergency Medicine

## 2023-06-12 ENCOUNTER — Other Ambulatory Visit: Payer: Self-pay

## 2023-06-12 ENCOUNTER — Emergency Department: Payer: No Typology Code available for payment source

## 2023-06-12 DIAGNOSIS — R051 Acute cough: Secondary | ICD-10-CM

## 2023-06-12 DIAGNOSIS — R059 Cough, unspecified: Secondary | ICD-10-CM | POA: Diagnosis present

## 2023-06-12 DIAGNOSIS — Z21 Asymptomatic human immunodeficiency virus [HIV] infection status: Secondary | ICD-10-CM | POA: Diagnosis not present

## 2023-06-12 DIAGNOSIS — Z1152 Encounter for screening for COVID-19: Secondary | ICD-10-CM | POA: Insufficient documentation

## 2023-06-12 DIAGNOSIS — J40 Bronchitis, not specified as acute or chronic: Secondary | ICD-10-CM | POA: Insufficient documentation

## 2023-06-12 DIAGNOSIS — F17210 Nicotine dependence, cigarettes, uncomplicated: Secondary | ICD-10-CM | POA: Diagnosis not present

## 2023-06-12 LAB — COMPREHENSIVE METABOLIC PANEL
ALT: 20 U/L (ref 0–44)
AST: 15 U/L (ref 15–41)
Albumin: 3.9 g/dL (ref 3.5–5.0)
Alkaline Phosphatase: 79 U/L (ref 38–126)
Anion gap: 11 (ref 5–15)
BUN: 16 mg/dL (ref 6–20)
CO2: 19 mmol/L — ABNORMAL LOW (ref 22–32)
Calcium: 8.8 mg/dL — ABNORMAL LOW (ref 8.9–10.3)
Chloride: 107 mmol/L (ref 98–111)
Creatinine, Ser: 0.89 mg/dL (ref 0.61–1.24)
GFR, Estimated: >60 mL/min (ref >60)
Glucose, Bld: 114 mg/dL — ABNORMAL HIGH (ref 70–99)
Potassium: 3.4 mmol/L — ABNORMAL LOW (ref 3.5–5.1)
Sodium: 137 mmol/L (ref 135–145)
Total Bilirubin: 0.6 mg/dL (ref 0.3–1.2)
Total Protein: 7.7 g/dL (ref 6.5–8.1)

## 2023-06-12 LAB — CBC WITH DIFFERENTIAL/PLATELET
Abs Immature Granulocytes: 0.1 10*3/uL — ABNORMAL HIGH (ref 0.00–0.07)
Basophils Absolute: 0 10*3/uL (ref 0.0–0.1)
Basophils Relative: 1 %
Eosinophils Absolute: 0.1 10*3/uL (ref 0.0–0.5)
Eosinophils Relative: 2 %
HCT: 43.1 % (ref 39.0–52.0)
Hemoglobin: 14.9 g/dL (ref 13.0–17.0)
Immature Granulocytes: 1 %
Lymphocytes Relative: 23 %
Lymphs Abs: 1.7 10*3/uL (ref 0.7–4.0)
MCH: 31.8 pg (ref 26.0–34.0)
MCHC: 34.6 g/dL (ref 30.0–36.0)
MCV: 91.9 fL (ref 80.0–100.0)
Monocytes Absolute: 0.6 10*3/uL (ref 0.1–1.0)
Monocytes Relative: 8 %
Neutro Abs: 4.9 10*3/uL (ref 1.7–7.7)
Neutrophils Relative %: 65 %
Platelets: 221 10*3/uL (ref 150–400)
RBC: 4.69 MIL/uL (ref 4.22–5.81)
RDW: 14.1 % (ref 11.5–15.5)
WBC: 7.4 10*3/uL (ref 4.0–10.5)
nRBC: 0 % (ref 0.0–0.2)

## 2023-06-12 LAB — RESP PANEL BY RT-PCR (RSV, FLU A&B, COVID)  RVPGX2
Influenza A by PCR: NEGATIVE
Influenza B by PCR: NEGATIVE
Resp Syncytial Virus by PCR: NEGATIVE
SARS Coronavirus 2 by RT PCR: NEGATIVE

## 2023-06-12 MED ORDER — AMOXICILLIN 500 MG PO CAPS: 1000.0000 mg | ORAL_CAPSULE | Freq: Three times a day (TID) | ORAL | 0 refills | Status: AC

## 2023-06-12 MED ORDER — ALBUTEROL SULFATE HFA 108 (90 BASE) MCG/ACT IN AERS: 2.0000 | INHALATION_SPRAY | RESPIRATORY_TRACT | Status: DC | PRN

## 2023-06-12 NOTE — ED Provider Notes (Signed)
[facility] Provider Note    Event Date/Time   First MD Initiated Contact with Patient 06/12/23 1240     (approximate)   History   Cough and Shortness of Breath   HPI  Gabriel [name] is a 47 y.o. male with a history of HIV who presents with complaints of cough.  Patient does smoke cigarettes daily.  He reports cough is been ongoing for nearly a week now.  Did a video visit and was started on Z-Pak and prednisone 2 days ago with little improvement     Physical Exam   Triage Vital Signs: ED Triage Vitals  Encounter Vitals Group     BP 06/12/23 1150 (!) 112/93     Systolic BP Percentile --      Diastolic BP Percentile --      Pulse Rate 06/12/23 1150 98     Resp 06/12/23 1150 19     Temp 06/12/23 1150 99.1 F (37.3 C)     Temp src --      SpO2 06/12/23 1150 96 %     Weight 06/12/23 1151 104.3 kg (230 lb)     Height 06/12/23 1151 1.981 m (6\' 6" )     Head Circumference --      Peak Flow --      Pain Score 06/12/23 1151 0     Pain Loc --      Pain Education --      Exclude from Growth Chart --     Most recent vital signs: Vitals:   06/12/23 1150  BP: (!) 112/93  Pulse: 98  Resp: 19  Temp: 99.1 F (37.3 C)  SpO2: 96%     General: Awake, no distress.  CV:  Good peripheral perfusion.  Resp:  Normal effort.  Clear to auscultation bilaterally Abd:  No distention.  Other:     ED Results / Procedures / Treatments   Labs (all labs ordered are listed, but only abnormal results are displayed) Labs Reviewed  CBC WITH DIFFERENTIAL/PLATELET - Abnormal; Notable for the following components:      Result Value   Abs Immature Granulocytes 0.10 (*)    All other components within normal limits  COMPREHENSIVE METABOLIC PANEL - Abnormal; Notable for the following components:   Potassium 3.4 (*)    CO2 19 (*)    Glucose, Bld 114 (*)    Calcium 8.8 (*)    All other components within normal limits  RESP PANEL BY RT-PCR (RSV, FLU A&B, COVID)   [id number]     EKG     RADIOLOGY Chest x-ray viewed interpret by me, question possible infiltrate right lower lung otherwise unremarkable    PROCEDURES:  Critical Care performed:   Procedures   MEDICATIONS ORDERED IN ED: Medications  albuterol (VENTOLIN HFA) 108 (90 Base) MCG/ACT inhaler 2 puff (has no administration in time range)     IMPRESSION / MDM / ASSESSMENT AND PLAN / ED COURSE  I reviewed the triage vital signs and the nursing notes. Patient's presentation is most consistent with acute illness / injury with system symptoms.  Patient presents with cough as detailed above, overall vital signs are reassuring, he is not tachypneic, no wheezing on exam.  Question possible bronchitis, upper respiratory infection, pneumonia  He is on Z-Pak, will add amoxicillin 1 g 3 times daily for possible infiltrate on chest x-ray, close outpatient follow-up recommended        FINAL CLINICAL IMPRESSION(S) / ED DIAGNOSES  Final diagnoses:  Acute cough  Bronchitis     Rx / DC Orders   ED Discharge Orders          Ordered    amoxicillin (AMOXIL) 500 MG capsule  3 times daily        06/12/23 1244             Note:  This document was prepared using Dragon voice recognition software and may include unintentional dictation errors.   Jene Every, MD 06/12/23 [phone]

## 2023-06-12 NOTE — ED Triage Notes (Signed)
Pt arrives via POV w/ c/o "possible covid". Pt reports cough and SHOB x1 week, states he had an e-visit and was given prednisone and a zpak. Pt states he has taken x5 covid tests and they're all negative. Pt most concerned about his cough, states he "feels fine".

## 2023-06-12 NOTE — ED Notes (Signed)
Pt A&O x4, no obvious distress noted, respirations regular/unlabored. Pt verbalizes understanding of discharge instructions. Pt able to ambulate from ED independently.   

## 2023-07-21 ENCOUNTER — Encounter: Payer: Self-pay | Admitting: Pharmacist

## 2023-07-31 ENCOUNTER — Encounter: Payer: No Typology Code available for payment source | Admitting: Pharmacist

## 2023-08-02 NOTE — Progress Notes (Unsigned)
HPI: Gabriel [name] is a 47 y.o. male who presents to the [facility] pharmacy clinic for [place] administration.  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   IBUPROFEN (ADVIL) 600 MG TABLET    Take 1 tablet (600 mg total) by mouth every 6 (six) hours as needed for up to 40 doses for fever or mild pain.   ROSUVASTATIN (CRESTOR) 20 MG TABLET    Take 1 tablet (20 mg total) by mouth at bedtime.   TRAZODONE (DESYREL) 50 MG TABLET    Take 1 tablet (50 mg total) by mouth at bedtime as needed for sleep.  Modified Medications   No medications on file  Discontinued Medications   No medications on file    Allergies: Allergies  Allergen Reactions   Abacavir Other (See Comments)    HLA B5701 positive - should not receive abacavir due to risk of hypersensitivity    Labs: Lab Results  Component Value Date   HIV1RNAQUANT <20 (H) 05/29/2023   HIV1RNAQUANT 32 (H) 04/03/2023   HIV1RNAQUANT <20 (H) 01/24/2023   CD4TABS 504 05/29/2023   CD4TABS 542 04/03/2023   CD4TABS 491 11/29/2022    RPR and STI Lab Results  Component Value Date   LABRPR REACTIVE (A) 04/03/2023    LABRPR REACTIVE (A) 07/11/2022   LABRPR REACTIVE (A) 12/25/2020   LABRPR REACTIVE (A) 07/06/2020   LABRPR REACTIVE (A) 12/20/2019   RPRTITER 1:1 (H) 04/03/2023   RPRTITER 1:1 (H) 07/11/2022   RPRTITER 1:2 (H) 12/25/2020   RPRTITER 1:4 (H) 07/06/2020   RPRTITER 1:2 (H) 12/20/2019    STI Results GC CT  06/27/2017 12:00 AM Negative    Negative    Negative  Negative    Negative    Negative   12/17/2014 12:00 AM NG: Negative  CT: Negative     Hepatitis B Lab Results  Component Value Date   HEPBSAB REACTIVE (A) 01/24/2023   HEPBSAG NON-REACTIVE 01/24/2023   HEPBCAB POS (A) 11/10/2006   Hepatitis C No results found for: "HEPCAB", "HCVRNAPCRQN" Hepatitis A Lab Results  Component Value Date   HAV REACTIVE (A) 01/24/2023   Lipids: Lab Results  Component Value Date   CHOL 322 (H) 05/29/2023   TRIG 1,948 (H) 05/29/2023   HDL 31 (L) 05/29/2023   CHOLHDL 10.4 (H) 05/29/2023   VLDL 53 (H) 04/24/2014   LDLCALC  05/29/2023     Comment:     . LDL cholesterol not calculated. Triglyceride levels greater than 400 mg/dL invalidate calculated LDL results. . Reference range: <100 . Desirable range <100 mg/dL for primary prevention;   <70 mg/dL for patients with CHD or diabetic patients  with >  or = 2 CHD risk factors. Marland Kitchen LDL-C is now calculated using the Martin-Hopkins  calculation, which is a validated novel method providing  better accuracy than the Friedewald equation in the  estimation of LDL-C.  Horald Pollen et al. Lenox Ahr. 1610;960(45): 2061-2068  (http://education.[employer].com/faq/FAQ164)     TARGET DATE: The 9th  Assessment: Gabriel [name] presents today for his maintenance [name] injections. Past injections were tolerated well without issues. Last HIV RNA was undetectable in August; will defer today. Agrees to receive the annual flu and 2024-2025 COVID vaccines today. Declines STI testing.   Administered cabotegravir 600mg /48mL in left upper outer quadrant of the  gluteal muscle. Administered rilpivirine 900 mg/57mL in the right upper outer quadrant of the gluteal muscle. No issues with injections. He will follow up in 2 months for next set of injections.  Plan: - [name] injections administered - Administered the annual flu vaccine today - Administered the 2024-2025 COVID vaccine today  - Next injections scheduled for 09/25/23 with me - Call with any issues or questions  Aliceson Dolbow L. Elsy Chiang, PharmD, BCIDP, AAHIVP, CPP Clinical Pharmacist Practitioner Infectious Diseases Clinical Pharmacist [facility]

## 2023-08-03 ENCOUNTER — Other Ambulatory Visit: Payer: Self-pay

## 2023-08-03 ENCOUNTER — Ambulatory Visit: Payer: No Typology Code available for payment source | Admitting: Pharmacist

## 2023-08-03 ENCOUNTER — Other Ambulatory Visit (HOSPITAL_COMMUNITY): Payer: Self-pay

## 2023-08-03 DIAGNOSIS — B20 Human immunodeficiency virus [HIV] disease: Secondary | ICD-10-CM | POA: Diagnosis not present

## 2023-08-03 DIAGNOSIS — Z23 Encounter for immunization: Secondary | ICD-10-CM | POA: Diagnosis not present

## 2023-08-03 MED ORDER — CABOTEGRAVIR & RILPIVIRINE ER 600 & 900 MG/3ML IM SUER
1.0000 | Freq: Once | INTRAMUSCULAR | Status: AC
Start: 2023-08-03 — End: 2023-08-03
  Administered 2023-08-03: 1 via INTRAMUSCULAR

## 2023-09-04 ENCOUNTER — Telehealth: Payer: Self-pay

## 2023-09-04 ENCOUNTER — Other Ambulatory Visit (HOSPITAL_COMMUNITY): Payer: Self-pay

## 2023-09-04 NOTE — Telephone Encounter (Signed)
Pharmacy Patient Advocate Encounter- Gabriel [name] BIV-Medical Benefit:  J code: [id number]  CPT code: 82956  Dx Code: B20  No PA is required through [facility] . Authorization# [id number]

## 2023-09-22 NOTE — Progress Notes (Unsigned)
HPI: Gabriel [name] is a 47 y.o. male who presents to the [facility] pharmacy clinic for [place] administration.  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   IBUPROFEN (ADVIL) 600 MG TABLET    Take 1 tablet (600 mg total) by mouth every 6 (six) hours as needed for up to 40 doses for fever or mild pain.   ROSUVASTATIN (CRESTOR) 20 MG TABLET    Take 1 tablet (20 mg total) by mouth at bedtime.   TRAZODONE (DESYREL) 50 MG TABLET    Take 1 tablet (50 mg total) by mouth at bedtime as needed for sleep.  Modified Medications   No medications on file  Discontinued Medications   No medications on file    Allergies: Allergies  Allergen Reactions   Abacavir Other (See Comments)    HLA B5701 positive - should not receive abacavir due to risk of hypersensitivity    Labs: Lab Results  Component Value Date   HIV1RNAQUANT <20 (H) 05/29/2023   HIV1RNAQUANT 32 (H) 04/03/2023   HIV1RNAQUANT <20 (H) 01/24/2023   CD4TABS 504 05/29/2023   CD4TABS 542 04/03/2023   CD4TABS 491 11/29/2022    RPR and STI Lab Results  Component Value Date   LABRPR REACTIVE (A) 04/03/2023    LABRPR REACTIVE (A) 07/11/2022   LABRPR REACTIVE (A) 12/25/2020   LABRPR REACTIVE (A) 07/06/2020   LABRPR REACTIVE (A) 12/20/2019   RPRTITER 1:1 (H) 04/03/2023   RPRTITER 1:1 (H) 07/11/2022   RPRTITER 1:2 (H) 12/25/2020   RPRTITER 1:4 (H) 07/06/2020   RPRTITER 1:2 (H) 12/20/2019    STI Results GC CT  06/27/2017 12:00 AM Negative    Negative    Negative  Negative    Negative    Negative   12/17/2014 12:00 AM NG: Negative  CT: Negative     Hepatitis B Lab Results  Component Value Date   HEPBSAB REACTIVE (A) 01/24/2023   HEPBSAG NON-REACTIVE 01/24/2023   HEPBCAB POS (A) 11/10/2006   Hepatitis C No results found for: "HEPCAB", "HCVRNAPCRQN" Hepatitis A Lab Results  Component Value Date   HAV REACTIVE (A) 01/24/2023   Lipids: Lab Results  Component Value Date   CHOL 322 (H) 05/29/2023   TRIG 1,948 (H) 05/29/2023   HDL 31 (L) 05/29/2023   CHOLHDL 10.4 (H) 05/29/2023   VLDL 53 (H) 04/24/2014   LDLCALC  05/29/2023     Comment:     . LDL cholesterol not calculated. Triglyceride levels greater than 400 mg/dL invalidate calculated LDL results. . Reference range: <100 . Desirable range <100 mg/dL for primary prevention;   <70 mg/dL for patients with CHD or diabetic patients  with >  or = 2 CHD risk factors. Marland Kitchen LDL-C is now calculated using the Martin-Hopkins  calculation, which is a validated novel method providing  better accuracy than the Friedewald equation in the  estimation of LDL-C.  Horald Pollen et al. Lenox Ahr. 1610;960(45): 2061-2068  (http://education.[employer].com/faq/FAQ164)     TARGET DATE: The 9th  Assessment: Novak presents today for his maintenance [name] injections. Past injections were tolerated well without issues. Last saw [name] in August and was undetectable at that visit. States that he is depressed again and asking for a referral to psychiatry. Will have him follow up with [name] tomorrow via phone visit to discuss.  Administered  cabotegravir 600mg /38mL in left upper outer quadrant of the gluteal muscle. Administered rilpivirine 900 mg/20mL in the right upper outer quadrant of the gluteal muscle. No issues with injections. He will follow up in 2 months for next set of injections.  Plan: - [name] injections administered - Next injections scheduled for 11/28/23 with me - Call with any issues or questions  Meeah Totino L. Jamerson Vonbargen, PharmD, BCIDP, AAHIVP, CPP Clinical Pharmacist Practitioner Infectious Diseases Clinical Pharmacist [facility]

## 2023-09-25 ENCOUNTER — Ambulatory Visit (INDEPENDENT_AMBULATORY_CARE_PROVIDER_SITE_OTHER): Payer: No Typology Code available for payment source | Admitting: Pharmacist

## 2023-09-25 ENCOUNTER — Other Ambulatory Visit: Payer: Self-pay

## 2023-09-25 DIAGNOSIS — B20 Human immunodeficiency virus [HIV] disease: Secondary | ICD-10-CM | POA: Diagnosis not present

## 2023-09-25 MED ORDER — CABOTEGRAVIR & RILPIVIRINE ER 600 & 900 MG/3ML IM SUER
1.0000 | Freq: Once | INTRAMUSCULAR | Status: AC
Start: 2023-09-25 — End: 2023-09-25
  Administered 2023-09-25: 1 via INTRAMUSCULAR

## 2023-09-26 ENCOUNTER — Other Ambulatory Visit: Payer: Self-pay

## 2023-09-26 ENCOUNTER — Encounter: Payer: Self-pay | Admitting: Family

## 2023-09-26 ENCOUNTER — Telehealth (INDEPENDENT_AMBULATORY_CARE_PROVIDER_SITE_OTHER): Payer: No Typology Code available for payment source | Admitting: Family

## 2023-09-26 DIAGNOSIS — Z789 Other specified health status: Secondary | ICD-10-CM | POA: Diagnosis not present

## 2023-09-26 DIAGNOSIS — F332 Major depressive disorder, recurrent severe without psychotic features: Secondary | ICD-10-CM

## 2023-09-26 MED ORDER — SERTRALINE HCL 50 MG PO TABS: 50.0000 mg | ORAL_TABLET | Freq: Every day | ORAL | 1 refills | Status: DC

## 2023-09-26 NOTE — Patient Instructions (Addendum)
Nice to see you.   Continue to take your medication daily as prescribed.  Plan for follow up in 1 months or sooner if needed with lab work on the same day.  Have a great day and stay safe!   [facility] Fax - [phone]  [facility] (24/7) Phone: [phone] Address: [address] [place], [place] [address]   [facility] [phone] Crisis Line [phone]

## 2023-09-26 NOTE — Progress Notes (Unsigned)
Brief Narrative   Patient ID: Gabriel [name], male    DOB: Feb 16, 1976, 48 y.o.   MRN: [id number]    Subjective:    No chief complaint on file.   HPI:  Gabriel [name] is a 47 y.o. 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  Lab Results  Component Value Date   CD4TCELL 27 (L) 05/29/2023   CD4TABS 504 05/29/2023   Lab Results  Component Value Date   HIV1RNAQUANT <20 (H) 05/29/2023     Allergies  Allergen Reactions  . Abacavir Other (See Comments)    HLA B5701 positive - should not receive abacavir due to risk of hypersensitivity      Outpatient Medications Prior to Visit  Medication Sig Dispense Refill  . fexofenadine-pseudoephedrine (ALLEGRA-D) 60-120 MG 12 hr tablet Take 1 tablet by mouth 2 (two) times daily. 20 tablet 0  . ibuprofen (ADVIL) 600 MG tablet Take 1 tablet (600 mg total) by mouth every 6 (six) hours as needed for up to 40 doses for fever or mild pain. 40 tablet 0  . rosuvastatin (CRESTOR) 20 MG tablet Take 1 tablet (20 mg total) by mouth at bedtime. 30 tablet 5  . traZODone (DESYREL) 50 MG tablet Take 1 tablet (50 mg total) by mouth at bedtime as needed for sleep. 30 tablet 5   No facility-administered medications prior to visit.     Past Medical History:  Diagnosis Date  . Abscess    buttocks  . Cellulitis   . HIV positive (HCC)   . MRSA infection      Past Surgical History:  Procedure Laterality Date  . HAND / FINGER LESION EXCISION        Review of Systems     Objective:    There were no vitals taken for this visit. Nursing note and vital signs reviewed.  Physical Exam      05/29/2023    2:51 PM 04/03/2023    3:19 PM 07/11/2022    2:58 PM 02/12/2021    8:42 AM 12/25/2020    9:47 AM  Depression screen PHQ 2/9  Decreased Interest 0 0 0 0 0  Down, Depressed, Hopeless 0 0 0 0 0  PHQ - 2 Score 0 0 0 0 0       Assessment & Plan:    Patient Active Problem List   Diagnosis Date Noted  . Skin lesion 04/03/2023  . Alcohol use 08/04/2022  . Eustachian tube dysfunction 12/20/2019  . Bilateral lower extremity pain 12/20/2019  . Cough 04/30/2019  . Healthcare maintenance 01/18/2019  . Genital warts 06/27/2017  . IVDU (intravenous drug user) 06/27/2017  . Depression 06/27/2017  . Hematochezia 03/26/2015  . Dermatitis 06/10/2014  . Dyslipidemia 04/14/2011  . Blood in stool 03/20/2009  . 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 Dental caries 01/02/2009  . TOBACCO USER 05/27/2008  . INSOMNIA, CHRONIC 05/27/2008  . Human immunodeficiency virus (HIV) disease (HCC) 11/28/2006  . SYPHILIS NOS 11/28/2006  Problem List Items Addressed This Visit   None    I am having Caryn Bee L. Totzke maintain his fexofenadine-pseudoephedrine, ibuprofen, rosuvastatin, and traZODone.   No orders of the defined types were placed in this encounter.    Follow-up: No follow-ups on file. or sooner if needed.    Marcos Eke, MSN, FNP-C Nurse Practitioner [facility] for Infectious Disease [facility] Main number: [phone]

## 2023-09-27 NOTE — Assessment & Plan Note (Signed)
Gabriel [name] has an exacerbation of severe depression with severity effecting his ability to perform the essential functions of his job. Has had suicidal ideation. Discussed role of counseling and medication. Resources provided in AVS and referral placed to Psychiatry. Will start Sertraline 50 mg daily. It would be reasonable for him to have FMLA paperwork completed with 5 days every month for intermittent leave. Educated about potential increased risk of suicidal thoughts with SSRI and to see help if this develops or if symptoms worsen or do not improve.

## 2023-09-27 NOTE — Progress Notes (Signed)
Brief Narrative   Patient ID: Gabriel [name], male    DOB: 08/07/1976, 47 y.o.   MRN: [id number]  Gabriel [name] is a 47 y/o AA male diagnosed with HIV disease in January 2008 with risk factor of MSM. Initial viral load of 96,900 and CD4 count 160. Genosure with K103N (efavirenz, nevirapine) medication resistance. Entered care at [facility] Stage 3. Previous ART experience with Prezista/ritonavir, Prezcobix, Truvada, Descovy, and [name].   Subjective:    Chief Complaint  Patient presents with   Depression     Virtual Visit via Telephone/Video Note   I connected with Gabriel [name] on 09/26/23  at 2:35 pm  by video  and verified that I am speaking with the correct person using two identifiers.   I discussed the limitations, risks, security and privacy concerns of performing an evaluation and management service by telephone and the availability of in person appointments. I also discussed with the patient that there may be a patient responsible charge related to this service. The patient expressed understanding and agreed to proceed.  Location:  Patient: Home Provider: [facility]  HPI:  Gabriel [name] is a 47 y.o. male with HIV disease last seen on 09/25/23 by Aggie Cosier, PharmD, CPP with good adherence and tolerance to [name]. Lab work from 05/29/23 with undetectable viral load and CD4 count 504. Here today for an acute office visit.   Gabriel [name] has concern about his mental health feeling increasing amounts of depression that started about 3 months ago and have progressively worsened over that time now with severity of suicidal ideation at times with no plan of harming himself. In April was seen in the ED with laceration to the left forearm that was an attempt but did not have a psychiatric evaluation at the time because he "wanted to get out of there" so was treated as a laceration despite potential concern for intentionality per provider note. 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 He has not been on any medication or sought any treatment recently. Sleeping well with current dose of trazodone. Appetite is decreased. Requesting referral to Pscychology and Psychiatry as well as FMLA. Does not have any paid time off remaining for the rest of the this year and has 5 days off for next year thus far.   Denies fevers, chills, night sweats, headaches, changes in vision, neck pain/stiffness, nausea, diarrhea, vomiting, lesions or rashes Lab Results  Component Value Date   HIV1RNAQUANT <20 (H) 05/29/2023   Lab Results  Component Value Date   CD4TCELL 27 (L) 05/29/2023   CD4TABS 504 05/29/2023     Allergies  Allergen Reactions   Abacavir Other (See Comments)    HLA B5701 positive - should not receive abacavir due to risk of hypersensitivity      Outpatient Medications Prior to Visit  Medication Sig Dispense Refill   fexofenadine-pseudoephedrine (ALLEGRA-D) 60-120 MG 12 hr tablet Take 1 tablet by mouth 2 (two) times daily. 20 tablet 0   ibuprofen (ADVIL) 600 MG tablet Take 1 tablet (600 mg total) by mouth every 6 (six) hours as needed for up to 40 doses for fever or mild pain. 40 tablet 0   rosuvastatin (CRESTOR) 20 MG tablet Take 1 tablet (20 mg total) by mouth at bedtime. 30 tablet 5   traZODone (DESYREL) 50 MG tablet Take 1 tablet (  50 mg total) by mouth at bedtime as needed for sleep. 30 tablet 5   No facility-administered medications prior to visit.     Past Medical History:  Diagnosis Date   Abscess    buttocks   Cellulitis    HIV positive (HCC)    MRSA infection      Past Surgical History:  Procedure Laterality Date   HAND / FINGER LESION EXCISION        Review of Systems  Constitutional:  Negative for chills, diaphoresis, fatigue and fever.  Respiratory:  Negative for cough, chest tightness,  shortness of breath and wheezing.   Cardiovascular:  Negative for chest pain.  Gastrointestinal:  Negative for abdominal pain, diarrhea, nausea and vomiting.  Psychiatric/Behavioral:  Positive for dysphoric mood and suicidal ideas. Negative for agitation, behavioral problems, confusion, self-injury and sleep disturbance. The patient is nervous/anxious.       Objective:    There were no vitals taken for this visit. Nursing note and vital signs reviewed.  Physical Exam Constitutional:      General: He is not in acute distress.    Appearance: He is well-developed.  Skin:    General: Skin is warm and dry.  Neurological:     Mental Status: He is alert and oriented to person, place, and time.  Psychiatric:        Attention and Perception: Attention normal.        Mood and Affect: Mood is depressed.        Speech: Speech normal.        Behavior: Behavior normal.        Thought Content: Thought content normal.        Cognition and Memory: Cognition normal.        Judgment: Judgment normal.         05/29/2023    2:51 PM 04/03/2023    3:19 PM 07/11/2022    2:58 PM 02/12/2021    8:42 AM 12/25/2020    9:47 AM  Depression screen PHQ 2/9  Decreased Interest 0 0 0 0 0  Down, Depressed, Hopeless 0 0 0 0 0  PHQ - 2 Score 0 0 0 0 0       Assessment & Plan:    Patient Active Problem List   Diagnosis Date Noted   Skin lesion 04/03/2023   Alcohol use 08/04/2022   Eustachian tube dysfunction 12/20/2019   Bilateral lower extremity pain 12/20/2019   Cough 04/30/2019   Healthcare maintenance 01/18/2019   Genital warts 06/27/2017   IVDU (intravenous drug user) 06/27/2017   Depression 06/27/2017   Hematochezia 03/26/2015   Dermatitis 06/10/2014   Dyslipidemia 04/14/2011   Blood in stool 03/20/2009   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 Has had suicidal ideation. Discussed role of counseling and medication. Resources provided in AVS and referral placed to Psychiatry. Will start Sertraline 50 mg daily. It would be reasonable for him to have FMLA paperwork completed with 5 days every month for intermittent leave. Educated about potential increased risk of suicidal thoughts with SSRI  and to see help if this develops or if symptoms worsen or do not improve.       Relevant Medications   sertraline (ZOLOFT) 50 MG tablet   Other Relevant Orders   Ambulatory referral to Psychiatry   Alcohol use    Gabriel [name] continues to consume alcohol on a regular basis concerning for underlying alcohol misuse which may be exacerbating his depression. Has decreased consumption recently. Will continue to monitor.         I am having Gabriel [name] start on sertraline. I am also having him maintain his fexofenadine-pseudoephedrine, ibuprofen, rosuvastatin, and traZODone.   Meds ordered this encounter  Medications   sertraline (ZOLOFT) 50 MG tablet    Sig: Take 1 tablet (50 mg total) by mouth daily.    Dispense:  30 tablet    Refill:  1    Order Specific Question:   Supervising Provider    Answer:   Judyann Munson [phone]     I discussed the assessment and treatment plan with the patient. The patient was provided an opportunity to ask questions and all were answered. The patient agreed with the plan and demonstrated an understanding of the instructions.   The patient was advised to call back or seek an in-person evaluation if the symptoms worsen or if the condition fails to improve as anticipated.   I provided  20  minutes of non-face-to-face time during this encounter.  Follow-up:  Return in about 1 month (around 10/27/2023).   Marcos Eke, MSN, FNP-C Nurse Practitioner [facility] for Infectious Disease [facility] Main number: [phone]

## 2023-09-27 NOTE — Assessment & Plan Note (Signed)
Nektarios continues to consume alcohol on a regular basis concerning for underlying alcohol misuse which may be exacerbating his depression. Has decreased consumption recently. Will continue to monitor.

## 2023-11-06 ENCOUNTER — Encounter: Payer: Self-pay | Admitting: Pharmacist

## 2023-11-28 ENCOUNTER — Ambulatory Visit: Payer: No Typology Code available for payment source | Admitting: Pharmacist

## 2023-11-30 ENCOUNTER — Other Ambulatory Visit (HOSPITAL_COMMUNITY): Payer: Self-pay

## 2023-12-03 ENCOUNTER — Emergency Department
Admission: EM | Admit: 2023-12-03 | Discharge: 2023-12-03 | Disposition: A | Discharge status: Home / Self Care | Payer: No Typology Code available for payment source | Attending: Emergency Medicine | Admitting: Emergency Medicine

## 2023-12-03 ENCOUNTER — Other Ambulatory Visit: Payer: Self-pay

## 2023-12-03 ENCOUNTER — Emergency Department: Payer: No Typology Code available for payment source

## 2023-12-03 DIAGNOSIS — R072 Precordial pain: Secondary | ICD-10-CM | POA: Insufficient documentation

## 2023-12-03 DIAGNOSIS — Z716 Tobacco abuse counseling: Secondary | ICD-10-CM | POA: Diagnosis not present

## 2023-12-03 DIAGNOSIS — F17210 Nicotine dependence, cigarettes, uncomplicated: Secondary | ICD-10-CM | POA: Insufficient documentation

## 2023-12-03 DIAGNOSIS — R079 Chest pain, unspecified: Secondary | ICD-10-CM

## 2023-12-03 DIAGNOSIS — Z21 Asymptomatic human immunodeficiency virus [HIV] infection status: Secondary | ICD-10-CM | POA: Insufficient documentation

## 2023-12-03 DIAGNOSIS — R0781 Pleurodynia: Secondary | ICD-10-CM

## 2023-12-03 LAB — COMPREHENSIVE METABOLIC PANEL
ALT: 18 U/L (ref 0–44)
AST: 17 U/L (ref 15–41)
Albumin: 3.7 g/dL (ref 3.5–5.0)
Alkaline Phosphatase: 74 U/L (ref 38–126)
Anion gap: 10 (ref 5–15)
BUN: 11 mg/dL (ref 6–20)
CO2: 22 mmol/L (ref 22–32)
Calcium: 8.7 mg/dL — ABNORMAL LOW (ref 8.9–10.3)
Chloride: 106 mmol/L (ref 98–111)
Creatinine, Ser: 0.98 mg/dL (ref 0.61–1.24)
GFR, Estimated: >60 mL/min (ref >60)
Glucose, Bld: 116 mg/dL — ABNORMAL HIGH (ref 70–99)
Potassium: 3.5 mmol/L (ref 3.5–5.1)
Sodium: 138 mmol/L (ref 135–145)
Total Bilirubin: 0.7 mg/dL (ref 0.0–1.2)
Total Protein: 7 g/dL (ref 6.5–8.1)

## 2023-12-03 LAB — CBC WITH DIFFERENTIAL/PLATELET
Abs Immature Granulocytes: 0.03 10*3/uL (ref 0.00–0.07)
Basophils Absolute: 0.1 10*3/uL (ref 0.0–0.1)
Basophils Relative: 1 %
Eosinophils Absolute: 0.2 10*3/uL (ref 0.0–0.5)
Eosinophils Relative: 3 %
HCT: 43.4 % (ref 39.0–52.0)
Hemoglobin: 14.5 g/dL (ref 13.0–17.0)
Immature Granulocytes: 1 %
Lymphocytes Relative: 30 %
Lymphs Abs: 2 10*3/uL (ref 0.7–4.0)
MCH: 31 pg (ref 26.0–34.0)
MCHC: 33.4 g/dL (ref 30.0–36.0)
MCV: 92.7 fL (ref 80.0–100.0)
Monocytes Absolute: 0.6 10*3/uL (ref 0.1–1.0)
Monocytes Relative: 9 %
Neutro Abs: 3.7 10*3/uL (ref 1.7–7.7)
Neutrophils Relative %: 56 %
Platelets: 191 10*3/uL (ref 150–400)
RBC: 4.68 MIL/uL (ref 4.22–5.81)
RDW: 14 % (ref 11.5–15.5)
WBC: 6.5 10*3/uL (ref 4.0–10.5)
nRBC: 0 % (ref 0.0–0.2)

## 2023-12-03 LAB — LIPASE, BLOOD: Lipase: 36 U/L (ref 11–51)

## 2023-12-03 LAB — D-DIMER, QUANTITATIVE: D-Dimer, Quant: <0.27 ug{FEU}/mL (ref 0.00–0.50)

## 2023-12-03 LAB — TROPONIN I (HIGH SENSITIVITY): Troponin I (High Sensitivity): 3 ng/L (ref <18)

## 2023-12-03 MED ORDER — ASPIRIN 81 MG PO CHEW
324.0000 mg | CHEWABLE_TABLET | Freq: Once | ORAL | Status: AC
Start: 2023-12-03 — End: 2023-12-03
  Administered 2023-12-03: 324 mg via ORAL
  Filled 2023-12-03: qty 4

## 2023-12-03 MED ORDER — NICOTINE POLACRILEX 4 MG MT LOZG
4.0000 mg | LOZENGE | OROMUCOSAL | 0 refills | Status: DC | PRN
Start: 2023-12-03 — End: 2024-07-09

## 2023-12-03 MED ORDER — NICOTINE 14 MG/24HR TD PT24: 14.0000 mg | MEDICATED_PATCH | TRANSDERMAL | 3 refills | Status: DC

## 2023-12-03 NOTE — ED Provider Notes (Signed)
 Emergency department handoff note  Care of this patient was signed out to me at the end of the previous provider shift.  All pertinent patient information was conveyed and all questions were answered.  Patient pending troponin level and D-dimer both of which were negative. The patient has been reexamined and is ready to be discharged.  All diagnostic results have been reviewed and discussed with the patient/family.  Care plan has been outlined and the patient/family understands all current diagnoses, results, and treatment plans.  There are no new complaints, changes, or physical findings at this time.  All questions have been addressed and answered.  Patient was instructed to, and agrees to follow-up with their primary care physician as well as return to the emergency department if any new or worsening symptoms develop.   Jossie Artist POUR, MD 12/03/23 [phone]

## 2023-12-03 NOTE — ED Provider Notes (Signed)
 [facility] Provider Note    Event Date/Time   First MD Initiated Contact with Patient 12/03/23 0600     (approximate)   History   Chest Pain   HPI  Gabriel [name] is a 48 y.o. male   Past medical history of 1 pack/day smoker, HIV positive who is compliant with medications and last CD4 count over 500 in the summertime, who comes in with substernal chest pain starting last evening while at rest.  Constant substernal worse with deep breaths nonradiating nonexertional.  No shortness of breath or cough or fever or other respiratory infectious symptoms.  No abdominal pain.  No nausea or vomiting.  No other acute medical complaints.  He has never had a blood clot.  He has no leg symptoms.  He has no shortness of breath but the chest pain has some pleuritic component of it.   External Medical Documents Reviewed: Progress notes from December 2024 visit to infectious disease clinic noting past medical history and medications      Physical Exam   Triage Vital Signs: ED Triage Vitals  Encounter Vitals Group     BP      Systolic BP Percentile      Diastolic BP Percentile      Pulse      Resp      Temp      Temp src      SpO2      Weight      Height      Head Circumference      Peak Flow      Pain Score      Pain Loc      Pain Education      Exclude from Growth Chart     Most recent vital signs: Vitals:   12/03/23 0630 12/03/23 0645  BP: 112/78   Pulse: 73 71  Resp: (!) 22 19  Temp:    SpO2: 100% 100%    General: Awake, no distress.  CV:  Good peripheral perfusion.  Resp:  Normal effort.  Abd:  No distention.  Other:  Pleasant gentleman resting in no acute distress.  Clear lungs and a soft nontender abdomen and there is no chest wall tenderness to palpation.  He has radial pulses equal bilaterally and appears euvolemic overall.  He is breathing comfortably resting comfortably listening to music using his phone.   ED Results / Procedures /  Treatments   Labs (all labs ordered are listed, but only abnormal results are displayed) Labs Reviewed  COMPREHENSIVE METABOLIC PANEL - Abnormal; Notable for the following components:      Result Value   Glucose, Bld 116 (*)    Calcium  8.7 (*)    All other components within normal limits  LIPASE, BLOOD  CBC WITH DIFFERENTIAL/PLATELET  D-DIMER, QUANTITATIVE  TROPONIN I (HIGH SENSITIVITY)     I ordered and reviewed the above labs they are notable for normal initial troponin  EKG  ED ECG REPORT I, Ginnie Shams, the attending physician, personally viewed and interpreted this ECG.   Date: 12/03/2023  EKG Time: 0558  Rate: 81  Rhythm: sinus  Axis: 81  Intervals: nl  ST&T Change: no stemi    RADIOLOGY I independently reviewed and interpreted  Chest x-ray and see no obvious focal consolidation or pneumothorax I also reviewed radiologist's formal read.   PROCEDURES:  Critical Care performed: No  Procedures   MEDICATIONS ORDERED IN ED: Medications  aspirin  chewable tablet 324  mg (324 mg Oral Given 12/03/23 9360)     IMPRESSION / MDM / ASSESSMENT AND PLAN / ED COURSE  I reviewed the triage vital signs and the nursing notes.                                Patient's presentation is most consistent with acute presentation with potential threat to life or bodily function.  Differential diagnosis includes, but is not limited to, ACS, dissection, PE, pneumothorax, respiratory infection, musculoskeletal   The patient is on the cardiac monitor to evaluate for evidence of arrhythmia and/or significant heart rate changes.  MDM:    48 year old with acute onset substernal chest pain which would be atypical for cardiac ischemia and there is a pleuritic component concerning for ACS, PE, pneumothorax spontaneous.  Fortunately EKG looks nonischemic will follow with serial troponins.  Check a D-dimer for restratification VTE.  Chest x-ray shows no pneumothorax.  He appears  comfortable and stable overall.  I am unsure what is causing his symptoms.  I doubt his respiratory infection as he has no other respiratory infectious symptoms.  However we will check the above workup and if unremarkable and he remains stable I think he can be discharged and follow-up with his PMD.  -- I spent 5 minutes counseling this patient on smoking cessation.  We spoke about the patient's current tobacco use, impact of smoking, assessed willingness to quit, methods for cessation including medical management and nicotine  replacement therapy (which I prescribed to the patient) and advised follow-up with primary doctor to continue to address smoking cessation.      FINAL CLINICAL IMPRESSION(S) / ED DIAGNOSES   Final diagnoses:  Nonspecific chest pain  Encounter for smoking cessation counseling     Rx / DC Orders   ED Discharge Orders          Ordered    nicotine  polacrilex (NICOTINE  MINI) 4 MG lozenge  As needed        12/03/23 0640    nicotine  (NICODERM CQ  - DOSED IN MG/24 HOURS) 14 mg/24hr patch  Every 24 hours        12/03/23 0640             Note:  This document was prepared using Dragon voice recognition software and may include unintentional dictation errors.    Cyrena Mylar, MD 12/03/23 [phone]

## 2023-12-03 NOTE — ED Triage Notes (Signed)
 Pt c/o chest tightness that started yesterday around 16:00. Pt reports he took something for pain, went to bed and slept. Pt reports when he woke up this morning, he was still having chest pain.

## 2023-12-03 NOTE — Discharge Instructions (Addendum)
 Thank you for choosing us  for your health care today!  Please see your primary doctor this week for a follow up appointment. Please speak to them about the possibility of a sleep study for sleep apnea  If you have any new, worsening, or unexpected symptoms call your doctor right away or come back to the emergency department for reevaluation.  It was my pleasure to care for you today.   Ginnie EDISON Cyrena, MD

## 2023-12-04 ENCOUNTER — Other Ambulatory Visit: Payer: Self-pay

## 2023-12-04 ENCOUNTER — Ambulatory Visit (INDEPENDENT_AMBULATORY_CARE_PROVIDER_SITE_OTHER): Payer: No Typology Code available for payment source | Admitting: Pharmacist

## 2023-12-04 DIAGNOSIS — Z0000 Encounter for general adult medical examination without abnormal findings: Secondary | ICD-10-CM

## 2023-12-04 DIAGNOSIS — B20 Human immunodeficiency virus [HIV] disease: Secondary | ICD-10-CM

## 2023-12-04 MED ORDER — CABOTEGRAVIR & RILPIVIRINE ER 600 & 900 MG/3ML IM SUER
1.0000 | Freq: Once | INTRAMUSCULAR | Status: AC
  Administered 2023-12-04: 1 via INTRAMUSCULAR

## 2023-12-04 NOTE — Progress Notes (Signed)
HPI: Gabriel [name] is a 48 y.o. male who presents to the [facility] pharmacy clinic for [place] administration.  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   IBUPROFEN (ADVIL) 600 MG TABLET    Take 1 tablet (600 mg total) by mouth every 6 (six) hours as needed for up to 40 doses for fever or mild pain.   NICOTINE (NICODERM CQ - DOSED IN MG/24 HOURS) 14 MG/24HR PATCH    Place 1 patch (14 mg total) onto the skin daily.   NICOTINE POLACRILEX (NICOTINE MINI) 4 MG LOZENGE    Take 1 lozenge (4 mg total) by mouth as needed.   ROSUVASTATIN (CRESTOR) 20 MG TABLET    Take 1 tablet (20 mg total) by mouth at bedtime.   SERTRALINE (ZOLOFT) 50 MG TABLET    Take 1 tablet (50 mg total) by mouth daily.   TRAZODONE (DESYREL) 50 MG TABLET    Take 1 tablet (50 mg total) by mouth at bedtime as needed for sleep.  Modified Medications   No medications on file  Discontinued Medications   No medications on file    Allergies: Allergies  Allergen Reactions   Abacavir Other (See Comments)    HLA B5701 positive - should not receive abacavir due to risk of hypersensitivity     Labs: Lab Results  Component Value Date   HIV1RNAQUANT <20 (H) 05/29/2023   HIV1RNAQUANT 32 (H) 04/03/2023   HIV1RNAQUANT <20 (H) 01/24/2023   CD4TABS 504 05/29/2023   CD4TABS 542 04/03/2023   CD4TABS 491 11/29/2022    RPR and STI Lab Results  Component Value Date   LABRPR REACTIVE (A) 04/03/2023   LABRPR REACTIVE (A) 07/11/2022   LABRPR REACTIVE (A) 12/25/2020   LABRPR REACTIVE (A) 07/06/2020   LABRPR REACTIVE (A) 12/20/2019   RPRTITER 1:1 (H) 04/03/2023   RPRTITER 1:1 (H) 07/11/2022   RPRTITER 1:2 (H) 12/25/2020   RPRTITER 1:4 (H) 07/06/2020   RPRTITER 1:2 (H) 12/20/2019    STI Results GC CT  06/27/2017 12:00 AM Negative    Negative    Negative  Negative    Negative    Negative   12/17/2014 12:00 AM NG: Negative  CT: Negative     Hepatitis B Lab Results  Component Value Date   HEPBSAB REACTIVE (A) 01/24/2023   HEPBSAG NON-REACTIVE 01/24/2023   HEPBCAB POS (A) 11/10/2006   Hepatitis C No results found for: "HEPCAB", "HCVRNAPCRQN" Hepatitis A Lab Results  Component Value Date   HAV REACTIVE (A) 01/24/2023   Lipids: Lab Results  Component Value Date   CHOL 322 (H) 05/29/2023   TRIG 1,948 (H) 05/29/2023   HDL 31 (L) 05/29/2023  CHOLHDL 10.4 (H) 05/29/2023   VLDL 53 (H) 04/24/2014   LDLCALC  05/29/2023     Comment:     . LDL cholesterol not calculated. Triglyceride levels greater than 400 mg/dL invalidate calculated LDL results. . Reference range: <100 . Desirable range <100 mg/dL for primary prevention;   <70 mg/dL for patients with CHD or diabetic patients  with > or = 2 CHD risk factors. Marland Kitchen LDL-C is now calculated using the Martin-Hopkins  calculation, which is a validated novel method providing  better accuracy than the Friedewald equation in the  estimation of LDL-C.  Gabriel [name] et al. Lenox Ahr. 4403;474(25): 2061-2068  (http://education.[facility].com/faq/FAQ164)     TARGET DATE: The 9th  Assessment: [name] presents today for  his maintenance [name] injections. Past injections were tolerated well without issues. Last HIV RNA was undetectable in August 2024. He went to the ED yesterday with chest pain. All labs were negative. Glucose on CMP was mildly elevated, and he is worried for pre-diabetes. Will check a hemoglobin A1c today. He is also requesting a lipid panel check as his triglycerides were almost 2000 at the last check. Advised that it may not be 100% accurate since he has not fasted. He states that he has decreased his drinking lately and seems to be in a better mood. He did take 2 shots today though because he was stressed at work. He is taking the Zoloft every day. Discussed that it may take awhile to have full effect. He also tells me about a time back in January when he snorted cocaine and essentially "died" and was revived with Narcan. He states that it was a wake up call and he wants to do better. Will check labs today and have him see Gabriel [name] for his next set of injections.   Administered cabotegravir 600mg /69mL in left upper outer quadrant of the gluteal muscle. Administered rilpivirine 900 mg/76mL in the right upper outer quadrant of the gluteal muscle. No issues with injections. He will follow up in 2 months for next set of injections.  Plan: - [name] injections administered - HIV RNA, hemoglobin A1c, and lipid panel today - Next injections scheduled for 02/05/24 with Gabriel [name] and 03/26/24 with me - Call with any issues or questions  Gabriel [name], PharmD, BCIDP, AAHIVP, CPP Clinical Pharmacist Practitioner Infectious Diseases Clinical Pharmacist [facility]

## 2023-12-06 LAB — LIPID PANEL
Cholesterol: 177 mg/dL (ref <200)
HDL: 32 mg/dL — ABNORMAL LOW (ref >=40)
Non-HDL Cholesterol (Calc): 145 mg/dL — ABNORMAL HIGH (ref <130)
Total CHOL/HDL Ratio: 5.5 (calc) — ABNORMAL HIGH (ref <5.0)
Triglycerides: 592 mg/dL — ABNORMAL HIGH (ref <150)

## 2023-12-06 LAB — HIV-1 RNA QUANT-NO REFLEX-BLD
HIV 1 RNA Quant: 56 {copies}/mL — ABNORMAL HIGH
HIV-1 RNA Quant, Log: 1.75 {Log} — ABNORMAL HIGH

## 2023-12-06 LAB — HEMOGLOBIN A1C
Hgb A1c MFr Bld: 6.2 %{Hb} — ABNORMAL HIGH (ref <5.7)
Mean Plasma Glucose: 131 mg/dL
eAG (mmol/L): 7.3 mmol/L

## 2024-02-05 ENCOUNTER — Ambulatory Visit: Payer: No Typology Code available for payment source | Admitting: Family

## 2024-02-05 ENCOUNTER — Other Ambulatory Visit: Payer: Self-pay

## 2024-02-05 ENCOUNTER — Encounter: Payer: Self-pay | Admitting: Family

## 2024-02-05 VITALS — BP 114/80 | HR 91 | Temp 97.6°F | Ht 78.0 in | Wt 233.0 lb

## 2024-02-05 DIAGNOSIS — Z1211 Encounter for screening for malignant neoplasm of colon: Secondary | ICD-10-CM

## 2024-02-05 DIAGNOSIS — B20 Human immunodeficiency virus [HIV] disease: Secondary | ICD-10-CM | POA: Diagnosis not present

## 2024-02-05 DIAGNOSIS — F17210 Nicotine dependence, cigarettes, uncomplicated: Secondary | ICD-10-CM

## 2024-02-05 DIAGNOSIS — G4700 Insomnia, unspecified: Secondary | ICD-10-CM | POA: Diagnosis not present

## 2024-02-05 DIAGNOSIS — F1090 Alcohol use, unspecified, uncomplicated: Secondary | ICD-10-CM | POA: Diagnosis not present

## 2024-02-05 DIAGNOSIS — F17200 Nicotine dependence, unspecified, uncomplicated: Secondary | ICD-10-CM

## 2024-02-05 DIAGNOSIS — Z9189 Other specified personal risk factors, not elsewhere classified: Secondary | ICD-10-CM

## 2024-02-05 DIAGNOSIS — Z0000 Encounter for general adult medical examination without abnormal findings: Secondary | ICD-10-CM

## 2024-02-05 DIAGNOSIS — Z79899 Other long term (current) drug therapy: Secondary | ICD-10-CM

## 2024-02-05 MED ORDER — SERTRALINE HCL 50 MG PO TABS: 50.0000 mg | ORAL_TABLET | Freq: Every day | ORAL | 5 refills | Status: DC

## 2024-02-05 MED ORDER — TRAZODONE HCL 50 MG PO TABS: 50.0000 mg | ORAL_TABLET | Freq: Every evening | ORAL | 5 refills | Status: DC | PRN

## 2024-02-05 MED ORDER — ROSUVASTATIN CALCIUM 20 MG PO TABS: 20.0000 mg | ORAL_TABLET | Freq: Every day | ORAL | 5 refills | Status: DC

## 2024-02-05 MED ORDER — CEPHALEXIN 500 MG PO CAPS: 500.0000 mg | ORAL_CAPSULE | Freq: Four times a day (QID) | ORAL | 0 refills | Status: DC

## 2024-02-05 MED ORDER — CABOTEGRAVIR & RILPIVIRINE ER 600 & 900 MG/3ML IM SUER
1.0000 | Freq: Once | INTRAMUSCULAR | Status: AC
Start: 2024-02-05 — End: 2024-02-05
  Administered 2024-02-05: 1 via INTRAMUSCULAR

## 2024-02-05 NOTE — Patient Instructions (Addendum)
 Nice to see you.  We will check your lab work today.  Continue to take your medication daily as prescribed.  Refills have been sent to the pharmacy.  Plan for follow up in 4 months or sooner if needed with lab work on the same day.  Have a great day and stay safe!   Quitting Smoking Learn how quitting smoking can help prevent many health problems and improve your overall health. To view the content, go to this web address: https://pe.elsevier.com/[email or web address]  This video will expire on: 10/04/2025. If you need access to this video following this date, please reach out to the healthcare provider who assigned it to you. This information is not intended to replace advice given to you by your health care provider. Make sure you discuss any questions you have with your health care provider. Elsevier Patient Education  2024 [employer].

## 2024-02-05 NOTE — Assessment & Plan Note (Signed)
 Gabriel [name] has multiple risk factors for cardiovascular disease with ASCVD risk of 7%.  Discussed recommendations for tobacco cessation as well as reducing alcohol intake which will lower his triglyceride levels..  Fortunately he does have well-controlled HIV disease with [name].  No myalgias with rosuvastatin and will continue current dose of rosuvastatin.

## 2024-02-05 NOTE — Assessment & Plan Note (Signed)
 Counseled on the dangers of tobacco not ready to quit at this time.  Reviewed strategies to maximize success, including removing cigarettes and smoking materials from environment, stress management, substitution of other forms of reinforcement, support of family/friends, and written materials.

## 2024-02-05 NOTE — Progress Notes (Signed)
 Brief Narrative   Patient ID: Gabriel [name], male    DOB: [birth year], 48 y.o.   MRN: [id number]  Gabriel [name] is a 48 y/o AA male diagnosed with HIV disease in January 2008 with risk factor of MSM. Initial viral load of 96,900 and CD4 count 160. Genosure with K103N (efavirenz, nevirapine) medication resistance. Entered care at [facility] Stage 3. Previous ART experience with Prezista/ritonavir, Prezcobix, Truvada, Descovy, and [name].   Subjective:    Chief Complaint  Patient presents with   Follow-up    B20    HPI:  Gabriel [name] is a 48 y.o. male with HIV disease last seen by Aggie Cosier, PharmD, CPP on 12/04/2023 with good adherence and tolerance to [name].  Viral load was undetectable at 56.  Lipid profile elevated with triglycerides 592, HDL 32, and LDL unable to be calculated secondary to hypertriglyceridemia.  Recent kidney function, liver function, electrolytes within normal ranges.  Here today for routine follow-up and next injection.  -Has been doing okay since his last office visit and continues to have good tolerance to [place] with no adverse side effects.  Has concern for a skin infection and requesting a course of Keflex/cephalexin.  Continues to drink alcohol and smoke tobacco.  Condoms and site-specific STD testing offered.  Healthcare maintenance reviewed.  Due for routine dental care which will be scheduled independently.  Housing, access to food, and transportation are stable.  Denies fevers, chills, night sweats, headaches, changes in vision, neck pain/stiffness, nausea, diarrhea, vomiting, lesions or rashes.  Lab Results  Component Value Date   CD4TCELL 27 (L) 05/29/2023   CD4TABS 504 05/29/2023   Lab Results  Component Value Date   HIV1RNAQUANT 56 (H) 12/04/2023     Allergies  Allergen Reactions   Abacavir Other (See Comments)    HLA B5701 positive - should not receive abacavir due to risk of hypersensitivity      Outpatient Medications Prior to  Visit  Medication Sig Dispense Refill   fexofenadine-pseudoephedrine (ALLEGRA-D) 60-120 MG 12 hr tablet Take 1 tablet by mouth 2 (two) times daily. 20 tablet 0   ibuprofen (ADVIL) 600 MG tablet Take 1 tablet (600 mg total) by mouth every 6 (six) hours as needed for up to 40 doses for fever or mild pain. 40 tablet 0   rosuvastatin (CRESTOR) 20 MG tablet Take 1 tablet (20 mg total) by mouth at bedtime. 30 tablet 5   sertraline (ZOLOFT) 50 MG tablet Take 1 tablet (50 mg total) by mouth daily. 30 tablet 1   traZODone (DESYREL) 50 MG tablet Take 1 tablet (50 mg total) by mouth at bedtime as needed for sleep. 30 tablet 5   nicotine (NICODERM CQ - DOSED IN MG/24 HOURS) 14 mg/24hr patch Place 1 patch (14 mg total) onto the skin daily. (Patient not taking: Reported on 02/05/2024) 90 patch 3   nicotine polacrilex (NICOTINE MINI) 4 MG lozenge Take 1 lozenge (4 mg total) by mouth as needed. (Patient not taking: Reported on 02/05/2024) 100 tablet 0   No facility-administered medications prior to visit.     Past Medical History:  Diagnosis Date   Abscess    buttocks   Cellulitis    HIV positive (HCC)    MRSA infection      Past Surgical History:  Procedure Laterality Date   HAND / FINGER LESION EXCISION        Review of Systems  Constitutional:  Negative for chills, diaphoresis, fatigue and fever.  Respiratory:  Negative for cough, chest tightness, shortness of breath and wheezing.   Cardiovascular:  Negative for chest pain.  Gastrointestinal:  Negative for abdominal pain, diarrhea, nausea and vomiting.      Objective:    BP 114/80   Pulse 91   Temp 97.6 F (36.4 C) (Temporal)   Ht 6\' 6"  (1.981 m)   Wt 233 lb (105.7 kg)   SpO2 96%   BMI 26.93 kg/m  Nursing note and vital signs reviewed.  Physical Exam Constitutional:      General: He is not in acute distress.    Appearance: He is well-developed.  Eyes:     Conjunctiva/sclera: Conjunctivae normal.  Cardiovascular:     Rate  and Rhythm: Normal rate and regular rhythm.     Heart sounds: Normal heart sounds. No murmur heard.    No friction rub. No gallop.  Pulmonary:     Effort: Pulmonary effort is normal. No respiratory distress.     Breath sounds: Normal breath sounds. No wheezing or rales.  Chest:     Chest wall: No tenderness.  Abdominal:     General: Bowel sounds are normal.     Palpations: Abdomen is soft.     Tenderness: There is no abdominal tenderness.  Musculoskeletal:     Cervical back: Neck supple.  Lymphadenopathy:     Cervical: No cervical adenopathy.  Skin:    General: Skin is warm and dry.     Findings: No rash.  Neurological:     Mental Status: He is alert and oriented to person, place, and time.  Psychiatric:        Behavior: Behavior normal.        Thought Content: Thought content normal.        Judgment: Judgment normal.         02/05/2024    3:25 PM 05/29/2023    2:51 PM 04/03/2023    3:19 PM 07/11/2022    2:58 PM 02/12/2021    8:42 AM  Depression screen PHQ 2/9  Decreased Interest 0 0 0 0 0  Down, Depressed, Hopeless 0 0 0 0 0  PHQ - 2 Score 0 0 0 0 0        02/05/2024    3:25 PM  GAD 7 : Generalized Anxiety Score  Nervous, Anxious, on Edge 0     The 10-year ASCVD risk score (Arnett DK, et al., 2019) is: 7%   Values used to calculate the score:     Age: 85 years     Sex: Male     Is Non-Hispanic African American: Yes     Diabetic: No     Tobacco smoker: Yes     Systolic Blood Pressure: 114 mmHg     Is BP treated: No     HDL Cholesterol: 32 mg/dL     Total Cholesterol: 177 mg/dL     Assessment & Plan:    Patient Active Problem List   Diagnosis Date Noted   At risk for cardiovascular event 02/05/2024   Skin lesion 04/03/2023   Alcohol use 08/04/2022   Eustachian tube dysfunction 12/20/2019   Bilateral lower extremity pain 12/20/2019   Cough 04/30/2019   Healthcare maintenance 01/18/2019   Genital warts 06/27/2017   IVDU (intravenous drug user)  06/27/2017   Depression 06/27/2017   Hematochezia 03/26/2015   Dermatitis 06/10/2014   Dyslipidemia 04/14/2011   Blood in stool 03/20/2009   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  Reviewed previous lab work and discussed plan of care and U equals U.  Social determinants of health reviewed with no interventions indicated.  Continue current dose of [name].  Check blood work.  Plan for follow-up in 4 months or sooner if needed with lab work on the same day and with pharmacy providers every 2 months in between.      Relevant Medications   cephALEXin (KEFLEX) 500 MG capsule   Other Relevant Orders   T-helper cell (CD4)- ([facility] clinic only)   HIV-1 RNA quant-no reflex-bld   TOBACCO USER   Counseled on the dangers of tobacco not ready to quit at this time.  Reviewed strategies to maximize success, including removing cigarettes and smoking materials from environment, stress management, substitution of other forms of reinforcement, support of family/friends, and written materials.        INSOMNIA, CHRONIC   Chronic insomnia adequately managed with current dose of trazodone.  Getting adequate sleep.  Continue current dose of trazodone.      Healthcare maintenance   Discussed importance of safe sexual practice and condom use. Condoms and site specific STD testing offered.  Vaccinations reviewed and following counseling declined. Due for routine dental care and will schedule independently.  Declined referral to [facility] clinic. Due for colonoscopy/colon cancer screening with referral to  gastroenterology placed.      Alcohol use   Keyante continues to drink alcohol on a regular basis greater than the recommended maximum of 2 drinks per day.  Discussed that this is likely leading to his hypertriglyceridemia despite addition of rosuvastatin.  This is also likely contributing to potential for heart disease and liver dysfunction despite liver chemistries being normal.      At risk for cardiovascular event   Markee has multiple risk factors for cardiovascular disease with ASCVD risk of 7%.  Discussed recommendations for tobacco cessation as well as reducing alcohol intake which will lower his triglyceride levels..  Fortunately he does have well-controlled HIV disease with [name].  No myalgias with rosuvastatin and will continue current dose of rosuvastatin.      Other Visit Diagnoses       Encounter for screening for malignant neoplasm of colon       Relevant Orders   Ambulatory referral to Gastroenterology     Pharmacologic therapy       Relevant Orders   Lipid Profile        I am having Caryn Bee L. Juhnke start on cephALEXin. I am also having him maintain his fexofenadine-pseudoephedrine, ibuprofen, nicotine polacrilex, nicotine, rosuvastatin, sertraline, and traZODone. We administered cabotegravir & rilpivirine ER.   Meds ordered this encounter  Medications   cabotegravir & rilpivirine ER ([name]) 600 & 900 MG/3ML injection 1 kit   cephALEXin (KEFLEX) 500 MG capsule    Sig: Take 1 capsule (500 mg total) by mouth 4 (four) times daily.    Dispense:  28 capsule    Refill:  0    Supervising Provider:   Judyann Munson [[id number]]   rosuvastatin (CRESTOR) 20 MG tablet    Sig: Take 1 tablet (20 mg total) by mouth at bedtime.  Dispense:  30 tablet    Refill:  5    Supervising Provider:   SNIDER, CYNTHIA [[id number]]   sertraline (ZOLOFT) 50 MG tablet    Sig: Take 1 tablet (50 mg total) by mouth daily.    Dispense:  30 tablet    Refill:  5    Supervising Provider:   SNIDER,  CYNTHIA [[id number]]   traZODone (DESYREL) 50 MG tablet    Sig: Take 1 tablet (50 mg total) by mouth at bedtime as needed for sleep.    Dispense:  30 tablet    Refill:  5    Supervising Provider:   Liane Redman [[id number]]     Follow-up: Return in about 4 months (around 06/06/2024). or sooner if needed.    Marlan Silva, MSN, FNP-C Nurse Practitioner [facility] for Infectious Disease [facility] Main number: [phone]

## 2024-02-05 NOTE — Assessment & Plan Note (Addendum)
 Avory continues to have well-controlled virus with good adherence and tolerance to [name].  Reviewed previous lab work and discussed plan of care and U equals U.  Social determinants of health reviewed with no interventions indicated.  Continue current dose of [name].  Check blood work.  Plan for follow-up in 4 months or sooner if needed with lab work on the same day and with pharmacy providers every 2 months in between.

## 2024-02-05 NOTE — Assessment & Plan Note (Signed)
 Discussed importance of safe sexual practice and condom use. Condoms and site specific STD testing offered.  Vaccinations reviewed and following counseling declined. Due for routine dental care and will schedule independently.  Declined referral to [facility] clinic. Due for colonoscopy/colon cancer screening with referral to gastroenterology placed.

## 2024-02-05 NOTE — Assessment & Plan Note (Signed)
 Chronic insomnia adequately managed with current dose of trazodone.  Getting adequate sleep.  Continue current dose of trazodone.

## 2024-02-05 NOTE — Assessment & Plan Note (Signed)
 Gabriel [name] continues to drink alcohol on a regular basis greater than the recommended maximum of 2 drinks per day.  Discussed that this is likely leading to his hypertriglyceridemia despite addition of rosuvastatin.  This is also likely contributing to potential for heart disease and liver dysfunction despite liver chemistries being normal.

## 2024-02-06 LAB — T-HELPER CELL (CD4) - (RCID CLINIC ONLY)
CD4 % Helper T Cell: 28 % — ABNORMAL LOW (ref 33–65)
CD4 T Cell Abs: 533 /uL (ref 400–1790)

## 2024-02-08 LAB — HIV-1 RNA QUANT-NO REFLEX-BLD
HIV 1 RNA Quant: 27 {copies}/mL — ABNORMAL HIGH
HIV-1 RNA Quant, Log: 1.43 {Log_copies}/mL — ABNORMAL HIGH

## 2024-02-08 LAB — LIPID PANEL
Cholesterol: 202 mg/dL — ABNORMAL HIGH (ref <200)
HDL: 29 mg/dL — ABNORMAL LOW (ref >=40)
Non-HDL Cholesterol (Calc): 173 mg/dL — ABNORMAL HIGH (ref <130)
Total CHOL/HDL Ratio: 7 (calc) — ABNORMAL HIGH (ref <5.0)
Triglycerides: 860 mg/dL — ABNORMAL HIGH (ref <150)

## 2024-03-21 ENCOUNTER — Other Ambulatory Visit (HOSPITAL_COMMUNITY): Payer: Self-pay

## 2024-03-22 DIAGNOSIS — R319 Hematuria, unspecified: Secondary | ICD-10-CM

## 2024-03-22 NOTE — Progress Notes (Signed)
 The 10-year ASCVD risk score (Arnett DK, et al., 2019) is: 7.5%   Values used to calculate the score:     Age: 48 years     Sex: Male     Is Non-Hispanic African American: Yes     Diabetic: No     Tobacco smoker: Yes     Systolic Blood Pressure: 114 mmHg     Is BP treated: No     HDL Cholesterol: 29 mg/dL     Total Cholesterol: 202 mg/dL  Currently prescribed rosuvastatin  20 mg.  Adryen Cookson, BSN, RN

## 2024-03-25 ENCOUNTER — Ambulatory Visit (INDEPENDENT_AMBULATORY_CARE_PROVIDER_SITE_OTHER): Admitting: Pharmacist

## 2024-03-25 ENCOUNTER — Other Ambulatory Visit: Payer: Self-pay

## 2024-03-25 DIAGNOSIS — B20 Human immunodeficiency virus [HIV] disease: Secondary | ICD-10-CM

## 2024-03-25 MED ORDER — CABOTEGRAVIR & RILPIVIRINE ER 600 & 900 MG/3ML IM SUER
1.0000 | Freq: Once | INTRAMUSCULAR | Status: AC
  Administered 2024-03-25: 1 via INTRAMUSCULAR

## 2024-03-25 NOTE — Progress Notes (Signed)
 HPI: Gabriel [name] is a 48 y.o. male who presents to the [facility] pharmacy clinic for [name]  administration.  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   FEXOFENADINE -PSEUDOEPHEDRINE (ALLEGRA-D) 60-120 MG 12 HR TABLET    Take 1 tablet by mouth 2 (two) times daily.   IBUPROFEN  (ADVIL ) 600 MG TABLET    Take 1 tablet (600 mg total) by mouth every 6 (six) hours as needed for up to 40 doses for fever or mild pain.   NICOTINE  (NICODERM CQ  - DOSED IN MG/24 HOURS) 14 MG/24HR PATCH    Place 1 patch (14 mg total) onto the skin daily.   NICOTINE  POLACRILEX (NICOTINE  MINI) 4 MG LOZENGE    Take 1 lozenge (4 mg total) by mouth as needed.   ROSUVASTATIN  (CRESTOR ) 20 MG TABLET    Take 1 tablet (20 mg total) by mouth at bedtime.   SERTRALINE  (ZOLOFT ) 50 MG TABLET    Take 1 tablet (50 mg total) by mouth daily.   TRAZODONE  (DESYREL ) 50 MG TABLET    Take 1 tablet (50 mg total) by mouth at bedtime as needed for sleep.  Modified Medications   No medications on file  Discontinued Medications   No medications on file    Allergies: Allergies   Allergen Reactions   Abacavir Other (See Comments)    HLA B5701 positive - should not receive abacavir due to risk of hypersensitivity    Labs: Lab Results  Component Value Date   HIV1RNAQUANT 27 (H) 02/05/2024   HIV1RNAQUANT 56 (H) 12/04/2023   HIV1RNAQUANT <20 (H) 05/29/2023   CD4TABS 533 02/05/2024   CD4TABS 504 05/29/2023   CD4TABS 542 04/03/2023    RPR and STI Lab Results  Component Value Date   LABRPR REACTIVE (A) 04/03/2023   LABRPR REACTIVE (A) 07/11/2022   LABRPR REACTIVE (A) 12/25/2020   LABRPR REACTIVE (A) 07/06/2020   LABRPR REACTIVE (A) 12/20/2019   RPRTITER 1:1 (H) 04/03/2023   RPRTITER 1:1 (H) 07/11/2022   RPRTITER 1:2 (H) 12/25/2020   RPRTITER 1:4 (H) 07/06/2020   RPRTITER 1:2 (H) 12/20/2019    STI Results GC CT  06/27/2017 12:00 AM Negative    Negative    Negative  Negative    Negative    Negative   12/17/2014 12:00 AM NG: Negative  CT: Negative     Hepatitis B Lab Results  Component Value Date   HEPBSAB REACTIVE (A) 01/24/2023   HEPBSAG NON-REACTIVE 01/24/2023   HEPBCAB POS (A) 11/10/2006   Hepatitis C No results found for: "HEPCAB", "HCVRNAPCRQN" Hepatitis A Lab Results  Component Value Date   HAV  REACTIVE (A) 01/24/2023   Lipids: Lab Results  Component Value Date   CHOL 202 (H) 02/05/2024   TRIG 860 (H) 02/05/2024   HDL 29 (L) 02/05/2024   CHOLHDL 7.0 (H) 02/05/2024   VLDL 53 (H) 04/24/2014   LDLCALC  02/05/2024     Comment:     . LDL cholesterol not calculated. Triglyceride levels greater than 400 mg/dL invalidate calculated LDL results. . Reference range: <100 . Desirable range <100 mg/dL for primary prevention;   <70 mg/dL for patients with CHD or diabetic patients  with > or = 2 CHD risk factors. Gabriel [name] LDL-C is now calculated using the Martin-Hopkins  calculation, which is a validated novel method providing  better accuracy than the Friedewald equation in the  estimation of LDL-C.  Melinda Sprawls et al. Erroll Heard. 1610;960(45):  2061-2068  (http://education.[employer].com/faq/FAQ164)     TARGET DATE: The 9th  Assessment: Gabriel [name] presents today for his maintenance [name]  injections. Past injections were tolerated well without issues. Last HIV RNA was 27 in April.   Administered cabotegravir  600mg /36mL in left upper outer quadrant of the gluteal muscle. Administered rilpivirine  900 mg/3mL in the right upper outer quadrant of the gluteal muscle. No issues with injections. He will follow up in 2 months for next set of injections.  Plan: - [name]  injections administered - Next injections scheduled for 05/27/24 with Erla Haw and 07/30/24 with Mylinda Asa - Call with any issues or questions  Avid Guillette L. Sherley Leser, PharmD, BCIDP, AAHIVP, CPP Clinical Pharmacist Practitioner - Infectious Diseases Clinical Pharmacist Lead - Specialty Pharmacy [facility] for Infectious Disease 03/25/2024, 3:26 PM

## 2024-03-26 ENCOUNTER — Ambulatory Visit: Payer: Self-pay | Admitting: Pharmacist

## 2024-03-26 LAB — URINALYSIS, ROUTINE W REFLEX MICROSCOPIC
Bacteria, UA: NONE SEEN /HPF
Bilirubin Urine: NEGATIVE
Glucose, UA: NEGATIVE
Hgb urine dipstick: NEGATIVE
Hyaline Cast: NONE SEEN /LPF
Leukocytes,Ua: NEGATIVE
Nitrite: NEGATIVE
RBC / HPF: NONE SEEN /HPF (ref 0–2)
Specific Gravity, Urine: 1.033 (ref 1.001–1.035)
Squamous Epithelial / HPF: NONE SEEN /HPF (ref <=5)
WBC, UA: NONE SEEN /HPF (ref 0–5)
pH: 5.5 (ref 5.0–8.0)

## 2024-03-26 LAB — MICROSCOPIC MESSAGE

## 2024-04-01 ENCOUNTER — Other Ambulatory Visit: Payer: Self-pay | Admitting: Family

## 2024-04-18 ENCOUNTER — Other Ambulatory Visit (HOSPITAL_COMMUNITY): Payer: Self-pay

## 2024-05-27 ENCOUNTER — Ambulatory Visit (INDEPENDENT_AMBULATORY_CARE_PROVIDER_SITE_OTHER): Admitting: Family

## 2024-05-27 ENCOUNTER — Other Ambulatory Visit: Payer: Self-pay

## 2024-05-27 ENCOUNTER — Encounter: Payer: Self-pay | Admitting: Family

## 2024-05-27 VITALS — BP 102/70 | HR 96 | Temp 97.7°F | Ht 78.0 in | Wt 241.0 lb

## 2024-05-27 DIAGNOSIS — F1090 Alcohol use, unspecified, uncomplicated: Secondary | ICD-10-CM

## 2024-05-27 DIAGNOSIS — F17200 Nicotine dependence, unspecified, uncomplicated: Secondary | ICD-10-CM | POA: Diagnosis not present

## 2024-05-27 DIAGNOSIS — Z9189 Other specified personal risk factors, not elsewhere classified: Secondary | ICD-10-CM | POA: Diagnosis not present

## 2024-05-27 DIAGNOSIS — B20 Human immunodeficiency virus [HIV] disease: Secondary | ICD-10-CM | POA: Diagnosis not present

## 2024-05-27 DIAGNOSIS — Z0000 Encounter for general adult medical examination without abnormal findings: Secondary | ICD-10-CM

## 2024-05-27 MED ORDER — CABOTEGRAVIR & RILPIVIRINE ER 600 & 900 MG/3ML IM SUER
1.0000 | Freq: Once | INTRAMUSCULAR | Status: AC
  Administered 2024-05-27: 1 via INTRAMUSCULAR

## 2024-05-27 MED ORDER — FENOFIBRATE 48 MG PO TABS: 48.0000 mg | ORAL_TABLET | Freq: Every day | ORAL | 5 refills | Status: DC

## 2024-05-27 NOTE — Assessment & Plan Note (Signed)
 Not currently sexually active.  Vaccinations reviewed and due for Menveo and Prevnar 20. Declined following counseling.  Due for routine dental care.  Encouraged to schedule colonoscopy for colon cancer screening.

## 2024-05-27 NOTE — Assessment & Plan Note (Signed)
 Gabriel [name] continues to have well controlled virus with good adherence and tolerance to [name] . Reviewed previous lab work and discussed plan of care and U equals U. Covered by [employer]. Social determinants of health reviewed and interventions recommended. [name]  provided with no complications. Continue current dose of q 2 month [name]  with plan to follow up in 4 months or sooner if needed with lab work on the same day and with pharmacy provider in between.

## 2024-05-27 NOTE — Patient Instructions (Addendum)
 Nice to see you.  Plan for follow up in 1 months or sooner if needed with lab work 1-2 weeks prior to appointment.   Plan for follow up in 4 months or sooner if needed with lab work on the same day with Pharmacy Provider in between.   Have a great day and stay safe!  Smoking Cessation: Quitline[place] 1-800-QUIT-NOW 315 530 3824); Espaol: 1-855-Djelo-Ya (1-276-850-5840) http://carroll-castaneda.info/

## 2024-05-27 NOTE — Assessment & Plan Note (Signed)
 10 year ASCVD risk of 7.9% and tolerating rosuvastatin  with no adverse side effects or myalgias. Continue current dose of rosuvastatin .

## 2024-05-27 NOTE — Assessment & Plan Note (Signed)
 Gabriel [name] continues to consume greater than the recommended 2 drinks daily. Educated on the standard drink and dangers of continued excessive alcohol intake. Suspect elevated triglycerides are secondary to his alcohol use. Discussed recommendation to reduce alcohol intake and may be at risk for withdrawal and advised not to stop abruptly and seek care if symptoms develop.

## 2024-05-27 NOTE — Assessment & Plan Note (Signed)
 Yahia continues to smoke tobacco daily. Counseled on the dangers of tobacco not ready to quit at this time.  Reviewed strategies to maximize success, including removing cigarettes and smoking materials from environment, stress management, substitution of other forms of reinforcement, support of family/friends, and written materials.

## 2024-05-27 NOTE — Progress Notes (Signed)
 Brief Narrative   Patient ID: Gabriel [name], male    DOB: [birth year], 48 y.o.   MRN: [id number]  Gabriel [name] is a 48 y/o AA male diagnosed with HIV disease in January 2008 with risk factor of MSM. Initial viral load of 96,900 and CD4 count 160. Genosure with K103N (efavirenz, nevirapine) medication resistance. Entered care at [facility] Stage 3. Previous ART experience with Prezista /ritonavir , Prezcobix , Truvada , Descovy, and [name].   Subjective:   Chief Complaint  Patient presents with   Follow-up    B20    HPI:  Gabriel [name] is a 48 y.o. male with HIV disease last seen on 03/25/2024 by Charlott Bal, RPH-CPP with good adherence and tolerance to [name] . Last lab work with well controlled virus with viral load undetectable and CD4 count 533. Renal function, liver function and electrolytes normal. Lipid profile with triglycerides 860. Here today for follow up.   Gabriel [name] has been doing well since his last office visit and continues to receive [name]  with no adverse side effects. No new concerns/complaints. Working full time. Housing, access to food and transportation are stable. Not currently sexually active. Healthcare maintenance reviewed.  Continues to smoke tobacco and drink alcohol.   Denies fevers, chills, night sweats, headaches, changes in vision, neck pain/stiffness, nausea, diarrhea, vomiting, lesions or rashes.  Lab Results  Component Value Date   CD4TCELL 28 (L) 02/05/2024   CD4TABS 533 02/05/2024   Lab Results  Component Value Date   HIV1RNAQUANT 27 (H) 02/05/2024     Allergies  Allergen Reactions   Abacavir Other (See Comments)    HLA B5701 positive - should not receive abacavir due to risk of hypersensitivity      Outpatient Medications Prior to Visit  Medication Sig Dispense Refill   ibuprofen  (ADVIL ) 600 MG tablet Take 1 tablet (600 mg total) by mouth every 6 (six) hours as needed for up to 40 doses for fever or mild pain. 40 tablet 0   rosuvastatin   (CRESTOR ) 20 MG tablet Take 1 tablet (20 mg total) by mouth at bedtime. 30 tablet 5   sertraline  (ZOLOFT ) 50 MG tablet Take 1 tablet (50 mg total) by mouth daily. 30 tablet 5   traZODone  (DESYREL ) 50 MG tablet Take 1 tablet (50 mg total) by mouth at bedtime as needed for sleep. 30 tablet 5   fexofenadine -pseudoephedrine (ALLEGRA-D) 60-120 MG 12 hr tablet Take 1 tablet by mouth 2 (two) times daily. (Patient not taking: Reported on 05/27/2024) 20 tablet 0   nicotine  (NICODERM CQ  - DOSED IN MG/24 HOURS) 14 mg/24hr patch Place 1 patch (14 mg total) onto the skin daily. (Patient not taking: Reported on 02/05/2024) 90 patch 3   nicotine  polacrilex (NICOTINE  MINI) 4 MG lozenge Take 1 lozenge (4 mg total) by mouth as needed. (Patient not taking: Reported on 02/05/2024) 100 tablet 0   cephALEXin  (KEFLEX ) 500 MG capsule Take 1 capsule (500 mg total) by mouth 4 (four) times daily. 28 capsule 0   No facility-administered medications prior to visit.     Past Medical History:  Diagnosis Date   Abscess    buttocks   Cellulitis    HIV positive (HCC)    MRSA infection      Past Surgical History:  Procedure Laterality Date   HAND / FINGER LESION EXCISION          Review of Systems  Constitutional:  Negative for appetite change, chills, fatigue, fever and unexpected weight change.  Eyes:  Negative for  visual disturbance.  Respiratory:  Negative for cough, chest tightness, shortness of breath and wheezing.   Cardiovascular:  Negative for chest pain and leg swelling.  Gastrointestinal:  Negative for abdominal pain, constipation, diarrhea, nausea and vomiting.  Genitourinary:  Negative for dysuria, flank pain, frequency, genital sores, hematuria and urgency.  Skin:  Negative for rash.  Allergic/Immunologic: Negative for immunocompromised state.  Neurological:  Negative for dizziness and headaches.     Objective:   BP 102/70   Pulse 96   Temp 97.7 F (36.5 C) (Temporal)   Ht 6' 6 (1.981 m)    Wt 241 lb (109.3 kg)   SpO2 97%   BMI 27.85 kg/m  Nursing note and vital signs reviewed.  Physical Exam Constitutional:      General: He is not in acute distress.    Appearance: He is well-developed.  Eyes:     Conjunctiva/sclera: Conjunctivae normal.  Cardiovascular:     Rate and Rhythm: Normal rate and regular rhythm.     Heart sounds: Normal heart sounds. No murmur heard.    No friction rub. No gallop.  Pulmonary:     Effort: Pulmonary effort is normal. No respiratory distress.     Breath sounds: Normal breath sounds. No wheezing or rales.  Chest:     Chest wall: No tenderness.  Abdominal:     General: Bowel sounds are normal.     Palpations: Abdomen is soft.     Tenderness: There is no abdominal tenderness.  Musculoskeletal:     Cervical back: Neck supple.  Lymphadenopathy:     Cervical: No cervical adenopathy.  Skin:    General: Skin is warm and dry.     Findings: No rash.  Neurological:     Mental Status: He is alert and oriented to person, place, and time.  Psychiatric:        Behavior: Behavior normal.        Thought Content: Thought content normal.        Judgment: Judgment normal.          02/05/2024    3:25 PM 05/29/2023    2:51 PM 04/03/2023    3:19 PM 07/11/2022    2:58 PM 02/12/2021    8:42 AM  Depression screen PHQ 2/9  Decreased Interest 0 0 0 0 0  Down, Depressed, Hopeless 0 0 0 0 0  PHQ - 2 Score 0 0 0 0 0        02/05/2024    3:25 PM  GAD 7 : Generalized Anxiety Score  Nervous, Anxious, on Edge 0     The 10-year ASCVD risk score (Arnett DK, et al., 2019) is: 6.5%   Values used to calculate the score:     Age: 5 years     Clincally relevant sex: Male     Is Non-Hispanic African American: Yes     Diabetic: No     Tobacco smoker: Yes     Systolic Blood Pressure: 102 mmHg     Is BP treated: No     HDL Cholesterol: 29 mg/dL     Total Cholesterol: 202 mg/dL      Assessment & Plan:    Patient Active Problem List   Diagnosis Date  Noted   At risk for cardiovascular event 02/05/2024   Skin lesion 04/03/2023   Alcohol use 08/04/2022   Eustachian tube dysfunction 12/20/2019   Bilateral lower extremity pain 12/20/2019   Cough 04/30/2019   Healthcare maintenance 01/18/2019  Genital warts 06/27/2017   IVDU (intravenous drug user) 06/27/2017   Depression 06/27/2017   Hematochezia 03/26/2015   Dermatitis 06/10/2014   Dyslipidemia 04/14/2011   Blood in stool 03/20/2009   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 Reviewed previous lab work and discussed plan of care and U equals U. Covered by [employer]. Social determinants of health reviewed and interventions recommended. [name]  provided with no complications. Continue current dose of q 2 month [name]  with plan to follow up in 4 months or sooner if needed with lab work on the same day and with pharmacy provider in between.       TOBACCO USER   Gabriel [name] continues to smoke tobacco daily. Counseled on the dangers of tobacco not ready to quit at this time.  Reviewed strategies to maximize success, including removing cigarettes and smoking materials from environment, stress management, substitution of other forms of reinforcement, support of family/friends, and written materials.        Healthcare maintenance   Not currently sexually active.  Vaccinations reviewed and due for Menveo and Prevnar 20. Declined following counseling.  Due for routine dental care.  Encouraged to schedule colonoscopy for colon cancer screening.       Alcohol use   Gabriel [name] continues to  consume greater than the recommended 2 drinks daily. Educated on the standard drink and dangers of continued excessive alcohol intake. Suspect elevated triglycerides are secondary to his alcohol use. Discussed recommendation to reduce alcohol intake and may be at risk for withdrawal and advised not to stop abruptly and seek care if symptoms develop.       At risk for cardiovascular event   10 year ASCVD risk of 7.9% and tolerating rosuvastatin  with no adverse side effects or myalgias. Continue current dose of rosuvastatin .         I have discontinued Gabriel [name]'s cephALEXin . I am also having him start on fenofibrate . Additionally, I am having him maintain his fexofenadine -pseudoephedrine, ibuprofen , nicotine  polacrilex, nicotine , rosuvastatin , sertraline , and traZODone . We administered cabotegravir  & rilpivirine  ER.   Meds ordered this encounter  Medications   fenofibrate  (TRICOR ) 48 MG tablet    Sig: Take 1 tablet (48 mg total) by mouth daily.    Dispense:  30 tablet    Refill:  5    Supervising Provider:   SNIDER, CYNTHIA [[id number]]   cabotegravir  & rilpivirine  ER ([name] ) 600 & 900 MG/3ML injection 1 kit     Follow-up: Return in about 4 months (around 09/26/2024). or sooner if needed.    Cathlyn July, MSN, FNP-C Nurse Practitioner [facility] Main number: [phone]

## 2024-07-06 ENCOUNTER — Other Ambulatory Visit: Payer: Self-pay

## 2024-07-06 ENCOUNTER — Encounter: Payer: Self-pay | Admitting: Emergency Medicine

## 2024-07-06 ENCOUNTER — Emergency Department

## 2024-07-06 ENCOUNTER — Inpatient Hospital Stay
Admission: EM | Admit: 2024-07-06 | Discharge: 2024-07-09 | DRG: 918 | Disposition: A | Discharge status: Other Acute Inpatient Hospital | Attending: Student | Admitting: Student

## 2024-07-06 DIAGNOSIS — N179 Acute kidney failure, unspecified: Secondary | ICD-10-CM | POA: Diagnosis present

## 2024-07-06 DIAGNOSIS — E872 Acidosis, unspecified: Secondary | ICD-10-CM | POA: Diagnosis present

## 2024-07-06 DIAGNOSIS — F1510 Other stimulant abuse, uncomplicated: Secondary | ICD-10-CM | POA: Diagnosis not present

## 2024-07-06 DIAGNOSIS — R197 Diarrhea, unspecified: Secondary | ICD-10-CM | POA: Diagnosis present

## 2024-07-06 DIAGNOSIS — F14150 Cocaine abuse with cocaine-induced psychotic disorder with delusions: Secondary | ICD-10-CM | POA: Diagnosis present

## 2024-07-06 DIAGNOSIS — R000 Tachycardia, unspecified: Secondary | ICD-10-CM | POA: Diagnosis present

## 2024-07-06 DIAGNOSIS — T50902A Poisoning by unspecified drugs, medicaments and biological substances, intentional self-harm, initial encounter: Secondary | ICD-10-CM | POA: Diagnosis not present

## 2024-07-06 DIAGNOSIS — R45851 Suicidal ideations: Principal | ICD-10-CM

## 2024-07-06 DIAGNOSIS — F32A Depression, unspecified: Secondary | ICD-10-CM | POA: Diagnosis present

## 2024-07-06 DIAGNOSIS — I952 Hypotension due to drugs: Secondary | ICD-10-CM | POA: Diagnosis not present

## 2024-07-06 DIAGNOSIS — F15150 Other stimulant abuse with stimulant-induced psychotic disorder with delusions: Secondary | ICD-10-CM | POA: Diagnosis present

## 2024-07-06 DIAGNOSIS — Z888 Allergy status to other drugs, medicaments and biological substances status: Secondary | ICD-10-CM

## 2024-07-06 DIAGNOSIS — E876 Hypokalemia: Secondary | ICD-10-CM | POA: Diagnosis present

## 2024-07-06 DIAGNOSIS — J449 Chronic obstructive pulmonary disease, unspecified: Secondary | ICD-10-CM | POA: Diagnosis not present

## 2024-07-06 DIAGNOSIS — G9341 Metabolic encephalopathy: Secondary | ICD-10-CM | POA: Diagnosis not present

## 2024-07-06 DIAGNOSIS — F17210 Nicotine dependence, cigarettes, uncomplicated: Secondary | ICD-10-CM | POA: Diagnosis present

## 2024-07-06 DIAGNOSIS — Z79899 Other long term (current) drug therapy: Secondary | ICD-10-CM

## 2024-07-06 DIAGNOSIS — T542X2A Toxic effect of corrosive acids and acid-like substances, intentional self-harm, initial encounter: Secondary | ICD-10-CM | POA: Diagnosis present

## 2024-07-06 DIAGNOSIS — F1910 Other psychoactive substance abuse, uncomplicated: Secondary | ICD-10-CM

## 2024-07-06 DIAGNOSIS — F1490 Cocaine use, unspecified, uncomplicated: Secondary | ICD-10-CM

## 2024-07-06 DIAGNOSIS — E639 Nutritional deficiency, unspecified: Secondary | ICD-10-CM | POA: Diagnosis present

## 2024-07-06 DIAGNOSIS — D72829 Elevated white blood cell count, unspecified: Secondary | ICD-10-CM | POA: Diagnosis present

## 2024-07-06 DIAGNOSIS — E861 Hypovolemia: Secondary | ICD-10-CM | POA: Diagnosis present

## 2024-07-06 DIAGNOSIS — R109 Unspecified abdominal pain: Secondary | ICD-10-CM | POA: Diagnosis present

## 2024-07-06 DIAGNOSIS — Z8249 Family history of ischemic heart disease and other diseases of the circulatory system: Secondary | ICD-10-CM

## 2024-07-06 DIAGNOSIS — F1010 Alcohol abuse, uncomplicated: Secondary | ICD-10-CM | POA: Diagnosis present

## 2024-07-06 DIAGNOSIS — R9431 Abnormal electrocardiogram [ECG] [EKG]: Secondary | ICD-10-CM | POA: Diagnosis present

## 2024-07-06 DIAGNOSIS — T50901A Poisoning by unspecified drugs, medicaments and biological substances, accidental (unintentional), initial encounter: Secondary | ICD-10-CM | POA: Diagnosis not present

## 2024-07-06 DIAGNOSIS — E559 Vitamin D deficiency, unspecified: Secondary | ICD-10-CM | POA: Diagnosis present

## 2024-07-06 DIAGNOSIS — Z8614 Personal history of Methicillin resistant Staphylococcus aureus infection: Secondary | ICD-10-CM

## 2024-07-06 DIAGNOSIS — F419 Anxiety disorder, unspecified: Secondary | ICD-10-CM | POA: Diagnosis present

## 2024-07-06 DIAGNOSIS — Z21 Asymptomatic human immunodeficiency virus [HIV] infection status: Secondary | ICD-10-CM | POA: Diagnosis present

## 2024-07-06 DIAGNOSIS — T43212A Poisoning by selective serotonin and norepinephrine reuptake inhibitors, intentional self-harm, initial encounter: Secondary | ICD-10-CM | POA: Diagnosis present

## 2024-07-06 LAB — URINALYSIS, ROUTINE W REFLEX MICROSCOPIC
Bilirubin Urine: NEGATIVE
Glucose, UA: NEGATIVE mg/dL
Hgb urine dipstick: NEGATIVE
Ketones, ur: NEGATIVE mg/dL
Nitrite: NEGATIVE
Protein, ur: 100 mg/dL — AB
Specific Gravity, Urine: 1.014 (ref 1.005–1.030)
pH: 5 (ref 5.0–8.0)

## 2024-07-06 LAB — URINE DRUG SCREEN, QUALITATIVE (ARMC ONLY)
Amphetamines, Ur Screen: POSITIVE — AB
Barbiturates, Ur Screen: NOT DETECTED
Benzodiazepine, Ur Scrn: NOT DETECTED
Cannabinoid 50 Ng, Ur ~~LOC~~: NOT DETECTED
Cocaine Metabolite,Ur ~~LOC~~: POSITIVE — AB
MDMA (Ecstasy)Ur Screen: NOT DETECTED
Methadone Scn, Ur: NOT DETECTED
Opiate, Ur Screen: NOT DETECTED
Phencyclidine (PCP) Ur S: NOT DETECTED
Tricyclic, Ur Screen: NOT DETECTED

## 2024-07-06 LAB — BASIC METABOLIC PANEL WITH GFR
Anion gap: 15 (ref 5–15)
BUN: 34 mg/dL — ABNORMAL HIGH (ref 6–20)
CO2: 19 mmol/L — ABNORMAL LOW (ref 22–32)
Calcium: 8.5 mg/dL — ABNORMAL LOW (ref 8.9–10.3)
Chloride: 101 mmol/L (ref 98–111)
Creatinine, Ser: 4.33 mg/dL — ABNORMAL HIGH (ref 0.61–1.24)
GFR, Estimated: 16 mL/min — ABNORMAL LOW (ref >60)
Glucose, Bld: 122 mg/dL — ABNORMAL HIGH (ref 70–99)
Potassium: 3.2 mmol/L — ABNORMAL LOW (ref 3.5–5.1)
Sodium: 135 mmol/L (ref 135–145)

## 2024-07-06 LAB — SALICYLATE LEVEL: Salicylate Lvl: <7 mg/dL — ABNORMAL LOW (ref 7.0–30.0)

## 2024-07-06 LAB — CBC WITH DIFFERENTIAL/PLATELET
Abs Immature Granulocytes: 0.04 K/uL (ref 0.00–0.07)
Basophils Absolute: 0 K/uL (ref 0.0–0.1)
Basophils Relative: 0 %
Eosinophils Absolute: 0 K/uL (ref 0.0–0.5)
Eosinophils Relative: 0 %
HCT: 44.9 % (ref 39.0–52.0)
Hemoglobin: 14.8 g/dL (ref 13.0–17.0)
Immature Granulocytes: 0 %
Lymphocytes Relative: 18 %
Lymphs Abs: 2.3 K/uL (ref 0.7–4.0)
MCH: 30.8 pg (ref 26.0–34.0)
MCHC: 33 g/dL (ref 30.0–36.0)
MCV: 93.3 fL (ref 80.0–100.0)
Monocytes Absolute: 0.7 K/uL (ref 0.1–1.0)
Monocytes Relative: 6 %
Neutro Abs: 9.5 K/uL — ABNORMAL HIGH (ref 1.7–7.7)
Neutrophils Relative %: 76 %
Platelets: 215 K/uL (ref 150–400)
RBC: 4.81 MIL/uL (ref 4.22–5.81)
RDW: 14.5 % (ref 11.5–15.5)
WBC: 12.5 K/uL — ABNORMAL HIGH (ref 4.0–10.5)
nRBC: 0 % (ref 0.0–0.2)

## 2024-07-06 LAB — COMPREHENSIVE METABOLIC PANEL WITH GFR
ALT: 37 U/L (ref 0–44)
AST: 69 U/L — ABNORMAL HIGH (ref 15–41)
Albumin: 4.6 g/dL (ref 3.5–5.0)
Alkaline Phosphatase: 81 U/L (ref 38–126)
Anion gap: 19 — ABNORMAL HIGH (ref 5–15)
BUN: 32 mg/dL — ABNORMAL HIGH (ref 6–20)
CO2: 23 mmol/L (ref 22–32)
Calcium: 9.5 mg/dL (ref 8.9–10.3)
Chloride: 97 mmol/L — ABNORMAL LOW (ref 98–111)
Creatinine, Ser: 4.63 mg/dL — ABNORMAL HIGH (ref 0.61–1.24)
GFR, Estimated: 15 mL/min — ABNORMAL LOW (ref >60)
Glucose, Bld: 135 mg/dL — ABNORMAL HIGH (ref 70–99)
Potassium: 3.4 mmol/L — ABNORMAL LOW (ref 3.5–5.1)
Sodium: 139 mmol/L (ref 135–145)
Total Bilirubin: 0.9 mg/dL (ref 0.0–1.2)
Total Protein: 7.9 g/dL (ref 6.5–8.1)

## 2024-07-06 LAB — MAGNESIUM: Magnesium: 2.5 mg/dL — ABNORMAL HIGH (ref 1.7–2.4)

## 2024-07-06 LAB — ACETAMINOPHEN LEVEL: Acetaminophen (Tylenol), Serum: <10 ug/mL — ABNORMAL LOW (ref 10–30)

## 2024-07-06 LAB — MRSA NEXT GEN BY PCR, NASAL: MRSA by PCR Next Gen: NOT DETECTED

## 2024-07-06 LAB — GLUCOSE, CAPILLARY: Glucose-Capillary: 135 mg/dL — ABNORMAL HIGH (ref 70–99)

## 2024-07-06 LAB — PROTIME-INR
INR: 1.2 (ref 0.8–1.2)
Prothrombin Time: 16.1 s — ABNORMAL HIGH (ref 11.4–15.2)

## 2024-07-06 LAB — ETHANOL: Alcohol, Ethyl (B): <15 mg/dL (ref <15)

## 2024-07-06 MED ORDER — LACTATED RINGERS IV BOLUS
1000.0000 mL | Freq: Once | INTRAVENOUS | Status: AC
  Administered 2024-07-06: 1000 mL via INTRAVENOUS

## 2024-07-06 MED ORDER — ONDANSETRON HCL 4 MG/2ML IJ SOLN: 4.0000 mg | Freq: Once | INTRAMUSCULAR | Status: DC

## 2024-07-06 MED ORDER — LORAZEPAM 2 MG/ML IJ SOLN: 1.0000 mg | INTRAMUSCULAR | Status: DC | PRN

## 2024-07-06 MED ORDER — CHLORHEXIDINE GLUCONATE CLOTH 2 % EX PADS
6.0000 | MEDICATED_PAD | Freq: Every day | CUTANEOUS | Status: DC
  Administered 2024-07-06 – 2024-07-09 (×4): 6 via TOPICAL
  Filled 2024-07-06: qty 6

## 2024-07-06 MED ORDER — THIAMINE MONONITRATE 100 MG PO TABS
100.0000 mg | ORAL_TABLET | Freq: Every day | ORAL | Status: DC
  Administered 2024-07-06 – 2024-07-09 (×3): 100 mg via ORAL
  Filled 2024-07-06 (×3): qty 1

## 2024-07-06 MED ORDER — ONDANSETRON HCL 4 MG/2ML IJ SOLN
4.0000 mg | Freq: Four times a day (QID) | INTRAMUSCULAR | Status: DC | PRN
  Administered 2024-07-07: 4 mg via INTRAVENOUS
  Filled 2024-07-06: qty 2

## 2024-07-06 MED ORDER — HEPARIN SODIUM (PORCINE) 5000 UNIT/ML IJ SOLN
5000.0000 [IU] | Freq: Three times a day (TID) | INTRAMUSCULAR | Status: DC
  Administered 2024-07-06 – 2024-07-08 (×5): 5000 [IU] via SUBCUTANEOUS
  Filled 2024-07-06 (×5): qty 1

## 2024-07-06 MED ORDER — LORAZEPAM 1 MG PO TABS
1.0000 mg | ORAL_TABLET | ORAL | Status: DC | PRN
  Administered 2024-07-07: 2 mg via ORAL
  Administered 2024-07-07: 1 mg via ORAL
  Filled 2024-07-06: qty 2
  Filled 2024-07-06: qty 1

## 2024-07-06 MED ORDER — ONDANSETRON HCL 4 MG PO TABS
4.0000 mg | ORAL_TABLET | Freq: Four times a day (QID) | ORAL | Status: DC | PRN
  Filled 2024-07-06: qty 1

## 2024-07-06 MED ORDER — SODIUM CHLORIDE 0.9 % IV SOLN
INTRAVENOUS | Status: DC
Start: 2024-07-06 — End: 2024-07-07

## 2024-07-06 MED ORDER — POTASSIUM CHLORIDE CRYS ER 20 MEQ PO TBCR
40.0000 meq | EXTENDED_RELEASE_TABLET | Freq: Once | ORAL | Status: AC
Start: 2024-07-06 — End: 2024-07-06
  Administered 2024-07-06: 40 meq via ORAL
  Filled 2024-07-06: qty 2

## 2024-07-06 MED ORDER — ACETAMINOPHEN 650 MG RE SUPP: 650.0000 mg | Freq: Four times a day (QID) | RECTAL | Status: DC | PRN

## 2024-07-06 MED ORDER — ORAL CARE MOUTH RINSE: 15.0000 mL | OROMUCOSAL | Status: DC | PRN

## 2024-07-06 MED ORDER — FOLIC ACID 1 MG PO TABS
1.0000 mg | ORAL_TABLET | Freq: Every day | ORAL | Status: DC
  Administered 2024-07-06 – 2024-07-09 (×4): 1 mg via ORAL
  Filled 2024-07-06 (×4): qty 1

## 2024-07-06 MED ORDER — SODIUM CHLORIDE 0.9 % IV BOLUS
500.0000 mL | Freq: Once | INTRAVENOUS | Status: AC
Start: 2024-07-06 — End: 2024-07-06
  Administered 2024-07-06: 500 mL via INTRAVENOUS

## 2024-07-06 MED ORDER — POLYETHYLENE GLYCOL 3350 17 G PO PACK: 17.0000 g | PACK | Freq: Every day | ORAL | Status: DC | PRN

## 2024-07-06 MED ORDER — THIAMINE HCL 100 MG/ML IJ SOLN
100.0000 mg | Freq: Every day | INTRAMUSCULAR | Status: DC
  Administered 2024-07-07: 100 mg via INTRAVENOUS
  Filled 2024-07-06: qty 2

## 2024-07-06 MED ORDER — ADULT MULTIVITAMIN W/MINERALS CH
1.0000 | ORAL_TABLET | Freq: Every day | ORAL | Status: DC
  Administered 2024-07-06 – 2024-07-09 (×4): 1 via ORAL
  Filled 2024-07-06 (×4): qty 1

## 2024-07-06 MED ORDER — ACETAMINOPHEN 325 MG PO TABS
650.0000 mg | ORAL_TABLET | Freq: Four times a day (QID) | ORAL | Status: DC | PRN
  Administered 2024-07-08: 650 mg via ORAL
  Filled 2024-07-06: qty 2

## 2024-07-06 MED ORDER — POTASSIUM CHLORIDE 10 MEQ/100ML IV SOLN
10.0000 meq | Freq: Once | INTRAVENOUS | Status: AC
  Administered 2024-07-06: 10 meq via INTRAVENOUS
  Filled 2024-07-06: qty 100

## 2024-07-06 NOTE — ED Notes (Signed)
 Report given to ICU

## 2024-07-06 NOTE — ED Notes (Signed)
 Pt taken to CT.
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
## 2024-07-06 NOTE — ED Provider Notes (Signed)
 [facility] Provider Note    Event Date/Time   First MD Initiated Contact with Patient 07/06/24 1518     (approximate)   History   Suicidal   HPI  Gabriel [name] is a 48 y.o. male who presents to the ED for evaluation of Suicidal   I reviewed infectious disease clinic visit from last month.  History of HIV continues on [name]  injections with recent reassuring lab work with undetectable viral load and CD4 count 533.  Patient presents to the ED from home via EMS for evaluation of abdominal cramping, emesis and diarrhea after intentionally drinking 3 solo cup full of boric acid to try to kill himself yesterday afternoon, approximately 12 hours ago.  Reports he only wants to hurt himself when he gets high.  Reports IVDU with methamphetamines and cocaine.   Physical Exam   Triage Vital Signs: ED Triage Vitals  Encounter Vitals Group     BP 07/06/24 1542 (!) 70/55     Girls Systolic BP Percentile --      Girls Diastolic BP Percentile --      Boys Systolic BP Percentile --      Boys Diastolic BP Percentile --      Pulse Rate 07/06/24 1542 (!) 110     Resp 07/06/24 1542 16     Temp 07/06/24 1542 97.9 F (36.6 C)     Temp Source 07/06/24 1542 Oral     SpO2 07/06/24 1542 100 %     Weight 07/06/24 1517 241 lb (109.3 kg)     Height 07/06/24 1517 6' 6 (1.981 m)     Head Circumference --      Peak Flow --      Pain Score 07/06/24 1517 6     Pain Loc --      Pain Education --      Exclude from Growth Chart --     Most recent vital signs: Vitals:   07/06/24 1542 07/06/24 1600  BP: (!) 70/55 (!) 99/43  Pulse: (!) 110 (!) 108  Resp: 16   Temp: 97.9 F (36.6 C)   SpO2: 100% 100%    General: Awake, no distress.  Somewhat restless in bed but pleasant and conversational CV:  Good peripheral perfusion.  Resp:  Normal effort.  Abd:  No distention.  Diffuse and mild abdominal tenderness MSK:  No deformity noted.  Neuro:  No focal deficits  appreciated. Other:     ED Results / Procedures / Treatments   Labs (all labs ordered are listed, but only abnormal results are displayed) Labs Reviewed  COMPREHENSIVE METABOLIC PANEL WITH GFR - Abnormal; Notable for the following components:      Result Value   Potassium 3.4 (*)    Chloride 97 (*)    Glucose, Bld 135 (*)    BUN 32 (*)    Creatinine, Ser 4.63 (*)    AST 69 (*)    GFR, Estimated 15 (*)    Anion gap 19 (*)    All other components within normal limits  CBC WITH DIFFERENTIAL/PLATELET - Abnormal; Notable for the following components:   WBC 12.5 (*)    Neutro Abs 9.5 (*)    All other components within normal limits  SALICYLATE LEVEL - Abnormal; Notable for the following components:   Salicylate Lvl <7.0 (*)    All other components within normal limits  ACETAMINOPHEN  LEVEL - Abnormal; Notable for the following components:   Acetaminophen  (Tylenol ), Serum <10 (*)  All other components within normal limits  PROTIME-INR - Abnormal; Notable for the following components:   Prothrombin Time 16.1 (*)    All other components within normal limits  ETHANOL  URINE DRUG SCREEN, QUALITATIVE ([facility] ONLY)  URINALYSIS, ROUTINE W REFLEX MICROSCOPIC  MAGNESIUM    EKG Sinus rhythm at rate of 101 bpm.  Normal axis.  QTc 539, no evidence of high-grade block.  No clear signs of acute ischemia.  RADIOLOGY CT abdomen/pelvis interpreted by me without evidence of acute pathology  Official radiology report(s): CT ABDOMEN PELVIS WO CONTRAST Result Date: 07/06/2024 EXAM: CT ABDOMEN AND PELVIS WITHOUT CONTRAST 07/06/2024 04:57:10 PM TECHNIQUE: CT of the abdomen and pelvis was performed without the administration of intravenous contrast. Multiplanar reformatted images are provided for review. Automated exposure control, iterative reconstruction, and/or weight-based adjustment of the mA/kV was utilized to reduce the radiation dose to as low as reasonably achievable. COMPARISON: 04/24/2026  and previous. CLINICAL HISTORY: Acute renal failure. N/V/D after boric acid ingestion for SI attempt. Diffuse abd pain. Pt via [facility] from home. Pt took 3 cupfuls of boric acid roach powder sometime yesterday afternoon/evening. Unable to give an exact time. Also states he to 10 50mg  Trazodone  throughout the night. Report he did do this to harm himself. Denies any SI at this time. Pt also uses meth and cocaine, last use was sometimes yesterday. Denies ETOH. Pt is A\T\Ox4 and NAD. FINDINGS: LOWER CHEST: No acute abnormality. LIVER: The liver is unremarkable. GALLBLADDER AND BILE DUCTS: Gallbladder is unremarkable. No biliary ductal dilatation. SPLEEN: No acute abnormality. PANCREAS: No acute abnormality. ADRENAL GLANDS: No acute abnormality. KIDNEYS, URETERS AND BLADDER: No stones in the kidneys or ureters. No hydronephrosis. No perinephric or periureteral stranding. Urinary bladder is nondistended. GI AND BOWEL: Stomach demonstrates no acute abnormality. There is no bowel obstruction. PERITONEUM AND RETROPERITONEUM: No ascites. No free air. VASCULATURE: Scattered aortoiliac calcified plaque without AAA. LYMPH NODES: No lymphadenopathy. REPRODUCTIVE ORGANS: No acute abnormality. BONES AND SOFT TISSUES: Bilateral hip DJD. Bilateral pelvic phleboliths. No acute osseous abnormality. No focal soft tissue abnormality. IMPRESSION: 1. No acute findings in the abdomen or pelvis. Electronically signed by: Katheleen Faes MD 07/06/2024 05:01 PM EDT RP Workstation: [id number]    PROCEDURES and INTERVENTIONS:  .1-3 Lead EKG Interpretation  Performed by: Claudene Rover, MD Authorized by: Claudene Rover, MD     Interpretation: abnormal     ECG rate:  104   ECG rate assessment: tachycardic     Rhythm: sinus tachycardia     Ectopy: none     Conduction: normal   .Critical Care  Performed by: Claudene Rover, MD Authorized by: Claudene Rover, MD   Critical care provider statement:    Critical care time (minutes):  75    Critical care time was exclusive of:  Separately billable procedures and treating other patients   Critical care was necessary to treat or prevent imminent or life-threatening deterioration of the following conditions:  Toxidrome and circulatory failure   Critical care was time spent personally by me on the following activities:  Development of treatment plan with patient or surrogate, discussions with consultants, evaluation of patient's response to treatment, examination of patient, ordering and review of laboratory studies, ordering and review of radiographic studies, ordering and performing treatments and interventions, pulse oximetry, re-evaluation of patient's condition and review of old charts   Medications  potassium chloride  SA (KLOR-CON  M) CR tablet 40 mEq (has no administration in time range)  potassium chloride  10 mEq in 100 mL  IVPB (has no administration in time range)  lactated ringers  bolus 1,000 mL (0 mLs Intravenous Stopped 07/06/24 1649)  lactated ringers  bolus 1,000 mL (1,000 mLs Intravenous New Bag/Given 07/06/24 1713)     IMPRESSION / MDM / ASSESSMENT AND PLAN / ED COURSE  I reviewed the triage vital signs and the nursing notes.  Differential diagnosis includes, but is not limited to, hypovolemia, acute withdrawals, polysubstance overdose  {Patient presents with symptoms of an acute illness or injury that is potentially life-threatening.  Patient presents with N/V/D after taking boric acid in the setting of polysubstance abuse causing paranoia and SI.  He appears hypovolemic/dry and has mild diffuse abdominal tenderness.  Generally well and no longer suicidal considering he is no longer acutely intoxicated.  Evaluated by psychiatry who cleared them from their perspective.   Blood work with AKI and small anion gap with low glucose.  Likely related to his significant fluid losses.  Consult with poison control, hospitalist, psychiatry and ICU.  EKG with a sinus rhythm with a  prolonged QTc, no ventricular dysrhythmias on the monitor.  Electrolytes with mildly low potassium that we will replace orally and IV, add on magnesium level.  Noncontrast CT obtained without evidence of urologic obstruction or clear acute pathology.   Clinical Course as of 07/06/24 1752  Sat Jul 06, 2024  1645 The patient has been placed in psychiatric observation due to the need to provide a safe environment for the patient while obtaining psychiatric consultation and evaluation, as well as ongoing medical and medication management to treat the patient's condition.  The patient has not been placed under full IVC at this time.   [DS]  1649 Reassessed and discussed plan of care [DS]  1649 He has been assessed by psychiatry team and cleared from their perspective considering he is no longer suicidal and only reports suicidal thoughts in the setting of acutely high with methamphetamines and cocaine.  I discussed with him renal failure, likely prerenal, pending CT scan and my recommendations for medical admission.  He is agreeable. [DS]  1719 I consult with medicine who agrees to admit [DS]  1729 After patient is evaluated by hospitalist they request I discussed with ICU [DS]  1750 I consult with Dr. Parris, discussed presentation, workup and hospitalist evaluation.  He does not feel like patient requires ICU admission and recommends hospitalist admit in the stepdown unit and they can consult ICU as needed [DS]    Clinical Course User Index [DS] Claudene Rover, MD     FINAL CLINICAL IMPRESSION(S) / ED DIAGNOSES   Final diagnoses:  Suicidal thoughts  Polysubstance abuse (HCC)  AKI (acute kidney injury) (HCC)     Rx / DC Orders   ED Discharge Orders     None        Note:  This document was prepared using Dragon voice recognition software and may include unintentional dictation errors.   Claudene Rover, MD 07/06/24 [phone]

## 2024-07-06 NOTE — Progress Notes (Signed)
 Spoke with Poison Control regarding labwork, vital signs, and current plan of care for this patient. Recommendations given:  - Maintain magnesium level of >=2 and potassium level of >=4. Replace as needed. - Repeat EKG to assess QRS duration and QTc interval. - Repeat labs. - Contact Poison Control if patient status/mentation changes or if he is initiated on pressors.  Arlean FORBES [name], RN

## 2024-07-06 NOTE — ED Triage Notes (Signed)
 Pt via [facility] from home. Pt took 3 cupfuls of boric acid roach powder sometime yesterday afternoon/evening. Unable to give an exact time. Also states he to 10 50mg  Trazodone  throughout the night. Report he did do this to harm himself. Denies any SI at this time. Pt also uses meth and cocaine, last use was sometimes yesterday. Denies ETOH. Pt is A&Ox4 and NAD.

## 2024-07-06 NOTE — Progress Notes (Signed)
       CROSS COVER NOTE  NAME: Gabriel [name] MRN: [id number] DOB : 07-20-1976    Concern as stated by nurse / staff   Hey Dr. Cleatus, I am caring for this patient admitted with intentional overdose of boric acid and trazodone . Several of his Bps have come back soft, last one 85/54 (64). He got 2L of LR in the ED and is on a NS infusion at 150 mL/hr currently. Do you think he needs any more fluid to help with the BP?      Pertinent findings on chart review: H&P briefly reviewed: Admitted with intentional drug overdose and AKI.  Poison control recommending supportive care, currently on NS 150 cc/h  Patient Assessment    07/06/2024    9:30 PM 07/06/2024    9:18 PM 07/06/2024    9:15 PM  Vitals with BMI  Systolic 85 85   Diastolic 57 54   Pulse 106 110 110     Assessment and  Interventions   Assessment:  Hypotension in the setting of overdose  Plan: Continue IV infusion Small additional NS bolus 500 mL Continue close monitoring in the ICU Goal MAP over 65, if persistently under 65, will start midodrine       07/07/2024    2:45 AM 07/07/2024    2:30 AM 07/07/2024    2:00 AM  Vitals with BMI  Systolic 75 76 80  Diastolic 46 52 54  Pulse 119 120 123     Addendum at 2:45am--circulatory shock:  --BP remains low -- --discussed with intensivist- will start pressors
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
## 2024-07-06 NOTE — ED Notes (Signed)
 Personal Belonging   Advice worker Black T-shirt Batman brief  Keys Cell phone White socks Off white slides [employer] Black polo hoodies
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
## 2024-07-06 NOTE — ED Notes (Signed)
 Spoke with poison control at this time and opened a new case.  Recommendations include EKG, CMP, CBC, tylenol , ASA at this time. Monitoring for the couple hours. For Boric Acid, it is noted to have GI upset most common symptoms. Keep pt hydrated.

## 2024-07-07 ENCOUNTER — Other Ambulatory Visit: Payer: Self-pay

## 2024-07-07 DIAGNOSIS — I952 Hypotension due to drugs: Secondary | ICD-10-CM | POA: Diagnosis not present

## 2024-07-07 LAB — CBC
HCT: 37.4 % — ABNORMAL LOW (ref 39.0–52.0)
Hemoglobin: 12.7 g/dL — ABNORMAL LOW (ref 13.0–17.0)
MCH: 31.7 pg (ref 26.0–34.0)
MCHC: 34 g/dL (ref 30.0–36.0)
MCV: 93.3 fL (ref 80.0–100.0)
Platelets: 183 K/uL (ref 150–400)
RBC: 4.01 MIL/uL — ABNORMAL LOW (ref 4.22–5.81)
RDW: 14.6 % (ref 11.5–15.5)
WBC: 10.5 K/uL (ref 4.0–10.5)
nRBC: 0 % (ref 0.0–0.2)

## 2024-07-07 LAB — BASIC METABOLIC PANEL WITH GFR
Anion gap: 14 (ref 5–15)
Anion gap: 10 (ref 5–15)
BUN: 34 mg/dL — ABNORMAL HIGH (ref 6–20)
BUN: 27 mg/dL — ABNORMAL HIGH (ref 6–20)
CO2: 19 mmol/L — ABNORMAL LOW (ref 22–32)
CO2: 24 mmol/L (ref 22–32)
Calcium: 8.2 mg/dL — ABNORMAL LOW (ref 8.9–10.3)
Calcium: 7.7 mg/dL — ABNORMAL LOW (ref 8.9–10.3)
Chloride: 102 mmol/L (ref 98–111)
Chloride: 104 mmol/L (ref 98–111)
Creatinine, Ser: 3.64 mg/dL — ABNORMAL HIGH (ref 0.61–1.24)
Creatinine, Ser: 2.09 mg/dL — ABNORMAL HIGH (ref 0.61–1.24)
GFR, Estimated: 20 mL/min — ABNORMAL LOW (ref >60)
GFR, Estimated: 38 mL/min — ABNORMAL LOW (ref >60)
Glucose, Bld: 115 mg/dL — ABNORMAL HIGH (ref 70–99)
Glucose, Bld: 169 mg/dL — ABNORMAL HIGH (ref 70–99)
Potassium: 3.7 mmol/L (ref 3.5–5.1)
Potassium: 3.3 mmol/L — ABNORMAL LOW (ref 3.5–5.1)
Sodium: 135 mmol/L (ref 135–145)
Sodium: 138 mmol/L (ref 135–145)

## 2024-07-07 LAB — HEPATIC FUNCTION PANEL
ALT: 28 U/L (ref 0–44)
AST: 46 U/L — ABNORMAL HIGH (ref 15–41)
Albumin: 3.7 g/dL (ref 3.5–5.0)
Alkaline Phosphatase: 59 U/L (ref 38–126)
Bilirubin, Direct: 0.1 mg/dL (ref 0.0–0.2)
Indirect Bilirubin: 0.8 mg/dL (ref 0.3–0.9)
Total Bilirubin: 0.9 mg/dL (ref 0.0–1.2)
Total Protein: 6.1 g/dL — ABNORMAL LOW (ref 6.5–8.1)

## 2024-07-07 LAB — PROTIME-INR
INR: 1.3 — ABNORMAL HIGH (ref 0.8–1.2)
Prothrombin Time: 16.4 s — ABNORMAL HIGH (ref 11.4–15.2)

## 2024-07-07 LAB — CK: Total CK: 1789 U/L — ABNORMAL HIGH (ref 49–397)

## 2024-07-07 LAB — BLOOD GAS, VENOUS
Acid-Base Excess: 3.3 mmol/L — ABNORMAL HIGH (ref 0.0–2.0)
Acid-Base Excess: 4.8 mmol/L — ABNORMAL HIGH (ref 0.0–2.0)
Bicarbonate: 29.1 mmol/L — ABNORMAL HIGH (ref 20.0–28.0)
Bicarbonate: 29.9 mmol/L — ABNORMAL HIGH (ref 20.0–28.0)
O2 Saturation: 89.7 %
O2 Saturation: 86.3 %
Patient temperature: 37
Patient temperature: 37
pCO2, Ven: 48 mmHg (ref 44–60)
pCO2, Ven: 45 mmHg (ref 44–60)
pH, Ven: 7.39 (ref 7.25–7.43)
pH, Ven: 7.43 (ref 7.25–7.43)
pO2, Ven: 61 mmHg — ABNORMAL HIGH (ref 32–45)
pO2, Ven: 53 mmHg — ABNORMAL HIGH (ref 32–45)

## 2024-07-07 LAB — C DIFFICILE QUICK SCREEN W PCR REFLEX
C Diff antigen: NEGATIVE
C Diff interpretation: NOT DETECTED
C Diff toxin: NEGATIVE

## 2024-07-07 LAB — LACTIC ACID, PLASMA: Lactic Acid, Venous: 1.4 mmol/L (ref 0.5–1.9)

## 2024-07-07 LAB — PROTEIN / CREATININE RATIO, URINE
Creatinine, Urine: 96 mg/dL
Total Protein, Urine: <6 mg/dL

## 2024-07-07 MED ORDER — HYDROXYZINE HCL 25 MG PO TABS
25.0000 mg | ORAL_TABLET | Freq: Four times a day (QID) | ORAL | Status: DC | PRN
  Administered 2024-07-07 – 2024-07-09 (×3): 25 mg via ORAL
  Filled 2024-07-07 (×3): qty 1

## 2024-07-07 MED ORDER — MIDODRINE HCL 5 MG PO TABS
5.0000 mg | ORAL_TABLET | Freq: Once | ORAL | Status: AC
Start: 2024-07-07 — End: 2024-07-07
  Administered 2024-07-07: 5 mg via ORAL
  Filled 2024-07-07: qty 1

## 2024-07-07 MED ORDER — MIDODRINE HCL 5 MG PO TABS
15.0000 mg | ORAL_TABLET | Freq: Three times a day (TID) | ORAL | Status: DC
  Administered 2024-07-07 – 2024-07-08 (×6): 15 mg via ORAL
  Filled 2024-07-07 (×6): qty 3

## 2024-07-07 MED ORDER — POTASSIUM CHLORIDE 10 MEQ/100ML IV SOLN
10.0000 meq | INTRAVENOUS | Status: AC
  Administered 2024-07-07 (×3): 10 meq via INTRAVENOUS
  Filled 2024-07-07 (×3): qty 100

## 2024-07-07 MED ORDER — SODIUM BICARBONATE 8.4 % IV SOLN
INTRAVENOUS | Status: DC
  Filled 2024-07-07: qty 1000
  Filled 2024-07-07: qty 150

## 2024-07-07 MED ORDER — POTASSIUM CHLORIDE CRYS ER 20 MEQ PO TBCR
40.0000 meq | EXTENDED_RELEASE_TABLET | Freq: Once | ORAL | Status: AC
  Administered 2024-07-07: 40 meq via ORAL
  Filled 2024-07-07: qty 2

## 2024-07-07 MED ORDER — LOPERAMIDE HCL 2 MG PO CAPS: 2.0000 mg | ORAL_CAPSULE | ORAL | Status: DC | PRN

## 2024-07-07 MED ORDER — CHLORDIAZEPOXIDE HCL 25 MG PO CAPS
25.0000 mg | ORAL_CAPSULE | Freq: Four times a day (QID) | ORAL | Status: DC | PRN
  Administered 2024-07-07 – 2024-07-09 (×4): 25 mg via ORAL
  Filled 2024-07-07 (×3): qty 5

## 2024-07-07 MED ORDER — CHLORDIAZEPOXIDE HCL 5 MG PO CAPS
25.0000 mg | ORAL_CAPSULE | Freq: Four times a day (QID) | ORAL | Status: AC
  Administered 2024-07-07 (×3): 25 mg via ORAL
  Filled 2024-07-07 (×4): qty 5

## 2024-07-07 MED ORDER — SODIUM CHLORIDE 0.9 % IV BOLUS
1000.0000 mL | Freq: Once | INTRAVENOUS | Status: AC
Start: 2024-07-07 — End: 2024-07-07
  Administered 2024-07-07: 1000 mL via INTRAVENOUS

## 2024-07-07 MED ORDER — PANTOPRAZOLE SODIUM 40 MG IV SOLR
40.0000 mg | Freq: Two times a day (BID) | INTRAVENOUS | Status: DC
  Administered 2024-07-07 – 2024-07-09 (×5): 40 mg via INTRAVENOUS
  Filled 2024-07-07 (×5): qty 10

## 2024-07-07 MED ORDER — LACTATED RINGERS IV SOLN: INTRAVENOUS | Status: DC

## 2024-07-07 MED ORDER — THIAMINE MONONITRATE 100 MG PO TABS: 100.0000 mg | ORAL_TABLET | Freq: Every day | ORAL | Status: DC

## 2024-07-07 MED ORDER — SODIUM BICARBONATE 8.4 % IV SOLN
50.0000 meq | Freq: Once | INTRAVENOUS | Status: AC
  Administered 2024-07-07: 50 meq via INTRAVENOUS

## 2024-07-07 MED ORDER — CHLORDIAZEPOXIDE HCL 5 MG PO CAPS
25.0000 mg | ORAL_CAPSULE | ORAL | Status: DC
  Administered 2024-07-09: 25 mg via ORAL
  Filled 2024-07-07: qty 5

## 2024-07-07 MED ORDER — LACTATED RINGERS IV BOLUS
1000.0000 mL | INTRAVENOUS | Status: AC
  Administered 2024-07-07: 1000 mL via INTRAVENOUS

## 2024-07-07 MED ORDER — NOREPINEPHRINE 4 MG/250ML-% IV SOLN
0.0000 ug/min | INTRAVENOUS | Status: DC
  Administered 2024-07-07: 2 ug/min via INTRAVENOUS
  Administered 2024-07-07: 11 ug/min via INTRAVENOUS
  Administered 2024-07-07 (×2): 12 ug/min via INTRAVENOUS
  Filled 2024-07-07 (×4): qty 250

## 2024-07-07 MED ORDER — CHLORDIAZEPOXIDE HCL 5 MG PO CAPS
25.0000 mg | ORAL_CAPSULE | Freq: Three times a day (TID) | ORAL | Status: AC
  Administered 2024-07-08 (×3): 25 mg via ORAL
  Filled 2024-07-07 (×3): qty 5

## 2024-07-07 MED ORDER — CHLORDIAZEPOXIDE HCL 5 MG PO CAPS: 25.0000 mg | ORAL_CAPSULE | Freq: Every day | ORAL | Status: DC

## 2024-07-07 NOTE — Progress Notes (Signed)
 Spoke with Poison Control this am regarding treatment plan recommendations. Most recent lab values were reviewed, and patient condition was discussed. Per Poison control the following recommendations were made: Continue supportive care Change BMP checks from q 6 hours to q 12 hours. Consider Diuretics if UOP decreases Check one more EKG for Qtc to confirm patient remains at baseline for Qtc.  Check CK to evaluate for Rhabdomyelitis.  Discussed with Dr. Aleskerov; orders placed per recommendations.
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
## 2024-07-07 NOTE — Consult Note (Signed)
 [facility] Consult Note:07/07/24    Date of Admission:  07/06/2024           Reason for Consult: Kidney injury    Referring Provider: Parris Manna, MD Primary Care Provider: Pcp, No   History of Presenting Illness:  Gabriel [name] is a 48 y.o. male with medical problems of HIV, smoking, IV drug abuse presented to the emergency room complaining of GI upset after drug use.  Patient is critically ill and not able to provide details.  Most of the information is obtained from the chart.  He does report that he drank 3 cups of boric acid at home as well as took a bottle of trazodone .  He also admits to using IV metabutethamine and cocaine.  This patient has previous suicide attempts as outlined in H&P.  Upon admission he is noted to have a creatinine of 4.63/GFR 15.  Last known creatinine was 0.98, GFR greater than 60 from 12/03/2023.  Poison control center was contacted by the primary team and their recommendations are being followed. Today his creatinine is down to 3.64. He is receiving norepinephrine , sodium bicarbonate  and lactated Ringer 's. Urine output recorded at 1275 cc so far today.   Review of Systems: ROS-unreliable  Past Medical History:  Diagnosis Date   Abscess    buttocks   Cellulitis    HIV positive (HCC)    MRSA infection     Social History   Tobacco Use   Smoking status: Every Day    Current packs/day: 1.50    Types: Cigarettes   Smokeless tobacco: Never  Vaping Use   Vaping status: Never Used  Substance Use Topics   Alcohol use: Yes    Alcohol/week: 3.0 standard drinks of alcohol    Types: 3 Standard drinks or equivalent per week    Comment: socially    Drug use: Not Currently    Types: Cocaine, IV, Methamphetamines    Comment: only meth at this point    Family History  Problem Relation Age of Onset   Hypertension Mother    Cancer Maternal Grandmother    Crohn's disease Brother      OBJECTIVE: Blood pressure (!) 88/48,  pulse (!) 108, temperature 98.5 F (36.9 C), temperature source Axillary, resp. rate (!) 29, height 6' 6 (1.981 m), weight 107.4 kg, SpO2 99%.  Physical Exam General appearance-somewhat restless HEENT-dry oral mucous membranes, anicteric Pulmonary-normal breathing effort Cardiac-tachycardic, no rub Abdomen-soft, mild midepigastric tenderness Extremities-no significant peripheral edema Neuro-speech is somewhat pressured and words are not clear. Skin-warm, dry Musculoskeletal-no acute joint swellings or effusions are noted.  Lab Results Lab Results  Component Value Date   WBC 10.5 07/07/2024   HGB 12.7 (L) 07/07/2024   HCT 37.4 (L) 07/07/2024   MCV 93.3 07/07/2024   PLT 183 07/07/2024    Lab Results  Component Value Date   CREATININE 2.09 (H) 07/07/2024   BUN 27 (H) 07/07/2024   NA 138 07/07/2024   K 3.3 (L) 07/07/2024   CL 104 07/07/2024   CO2 24 07/07/2024    Lab Results  Component Value Date   ALT 28 07/07/2024   AST 46 (H) 07/07/2024   ALKPHOS 59 07/07/2024   BILITOT 0.9 07/07/2024     Microbiology: Recent Results (from the past 240 hours)  MRSA Next Gen by PCR, Nasal     Status: None   Collection Time: 07/06/24  7:07 PM   Specimen: Nasal Mucosa; Nasal Swab  Result Value Ref Range  Status   MRSA by PCR Next Gen NOT DETECTED NOT DETECTED Final    Comment: (NOTE) The GeneXpert MRSA Assay (FDA approved for NASAL specimens only), is one component of a comprehensive MRSA colonization surveillance program. It is not intended to diagnose MRSA infection nor to guide or monitor treatment for MRSA infections. Test performance is not FDA approved in patients less than 82 years old. Performed at [facility], [address] [place], [place] [address]   C Difficile Quick Screen w PCR reflex     Status: None   Collection Time: 07/07/24 12:52 PM   Specimen: STOOL  Result Value Ref Range Status   C Diff antigen NEGATIVE NEGATIVE Final   C Diff toxin NEGATIVE  NEGATIVE Final   C Diff interpretation No C. difficile detected.  Final    Comment: Performed at [facility], [address] [place], [place] [address]    Medications: Scheduled Meds:  chlordiazePOXIDE   25 mg Oral QID   Followed by   NOREEN ON 07/08/2024] chlordiazePOXIDE   25 mg Oral TID   Followed by   NOREEN ON 07/09/2024] chlordiazePOXIDE   25 mg Oral BH-qamhs   Followed by   NOREEN ON 07/10/2024] chlordiazePOXIDE   25 mg Oral Daily   Chlorhexidine  Gluconate Cloth  6 each Topical Daily   folic acid   1 mg Oral Daily   heparin   5,000 Units Subcutaneous Q8H   midodrine   15 mg Oral TID WC   multivitamin with minerals  1 tablet Oral Daily   pantoprazole  (PROTONIX ) IV  40 mg Intravenous Q12H   thiamine   100 mg Oral Daily   Or   thiamine   100 mg Intravenous Daily   Continuous Infusions:  lactated ringers  100 mL/hr at 07/07/24 1511   norepinephrine  (LEVOPHED ) Adult infusion 11 mcg/min (07/07/24 1400)   potassium chloride  10 mEq (07/07/24 1516)   PRN Meds:.acetaminophen  **OR** acetaminophen , chlordiazePOXIDE , hydrOXYzine , loperamide , ondansetron  **OR** ondansetron  (ZOFRAN ) IV, mouth rinse, polyethylene glycol  Allergies  Allergen Reactions   Abacavir Other (See Comments)    HLA B5701 positive - should not receive abacavir due to risk of hypersensitivity    Urinalysis: Recent Labs    07/06/24 2020  COLORURINE AMBER*  LABSPEC 1.014  PHURINE 5.0  GLUCOSEU NEGATIVE  HGBUR NEGATIVE  BILIRUBINUR NEGATIVE  KETONESUR NEGATIVE  PROTEINUR 100*  NITRITE NEGATIVE  LEUKOCYTESUR TRACE*      Imaging: US  EKG SITE RITE Result Date: 07/07/2024 If Site Rite image not attached, placement could not be confirmed due to current cardiac rhythm.  CT ABDOMEN PELVIS WO CONTRAST Result Date: 07/06/2024 EXAM: CT ABDOMEN AND PELVIS WITHOUT CONTRAST 07/06/2024 04:57:10 PM TECHNIQUE: CT of the abdomen and pelvis was performed without the administration of intravenous contrast. Multiplanar  reformatted images are provided for review. Automated exposure control, iterative reconstruction, and/or weight-based adjustment of the mA/kV was utilized to reduce the radiation dose to as low as reasonably achievable. COMPARISON: 04/24/2026 and previous. CLINICAL HISTORY: Acute renal failure. N/V/D after boric acid ingestion for SI attempt. Diffuse abd pain. Pt via [facility] from home. Pt took 3 cupfuls of boric acid roach powder sometime yesterday afternoon/evening. Unable to give an exact time. Also states he to 10 50mg  Trazodone  throughout the night. Report he did do this to harm himself. Denies any SI at this time. Pt also uses meth and cocaine, last use was sometimes yesterday. Denies ETOH. Pt is A\T\Ox4 and NAD. FINDINGS: LOWER CHEST: No acute abnormality. LIVER: The liver is unremarkable. GALLBLADDER AND BILE DUCTS:  Gallbladder is unremarkable. No biliary ductal dilatation. SPLEEN: No acute abnormality. PANCREAS: No acute abnormality. ADRENAL GLANDS: No acute abnormality. KIDNEYS, URETERS AND BLADDER: No stones in the kidneys or ureters. No hydronephrosis. No perinephric or periureteral stranding. Urinary bladder is nondistended. GI AND BOWEL: Stomach demonstrates no acute abnormality. There is no bowel obstruction. PERITONEUM AND RETROPERITONEUM: No ascites. No free air. VASCULATURE: Scattered aortoiliac calcified plaque without AAA. LYMPH NODES: No lymphadenopathy. REPRODUCTIVE ORGANS: No acute abnormality. BONES AND SOFT TISSUES: Bilateral hip DJD. Bilateral pelvic phleboliths. No acute osseous abnormality. No focal soft tissue abnormality. IMPRESSION: 1. No acute findings in the abdomen or pelvis. Electronically signed by: Katheleen Faes MD 07/06/2024 05:01 PM EDT RP Workstation: [id number]      Assessment/Plan:  Gabriel [name] is a 48 y.o. male with medical problems of HIV, smoking, IV drug abuse, previous suicide attempts   was admitted on 07/06/2024 for :  Drug overdose [T50.901A] Suicidal  thoughts [R45.851] Polysubstance abuse (HCC) [F19.10] AKI (acute kidney injury) (HCC) [N17.9]  1.  Acute kidney injury Likely secondary to hypotension.  Salicylate and acetaminophen  level are negative.  Urine drug screen is positive for amphetamines and cocaine. CT abdomen pelvis without contrast shows liver is unremarkable, no abnormalities are noted in the kidneys. Urinalysis is negative for blood, some protein is noted on dipstick. Serum creatinine and urine output have improved with IV hydration. Continue supportive care.  2.  Hypokalemia This was noted at admission but has now corrected.  3.  Proteinuria Noted on dipstick. Will obtain urine protein to creatinine ratio.  4.  Hypotension Currently requiring pressors and volume resuscitation.   Gibson Lad Dennise 07/07/24

## 2024-07-07 NOTE — Progress Notes (Signed)
 Patient is restless, fidgety, with involuntary arm movements, agitated in bed, as well as moaning, and talking to himself. Denies pain intermittently, but also complains of head pain with coughing and also complained of eye pain. Appears to have involuntary mouth movements such as puckering, sucking. He is oriented and cooperative, but unable to remain still for more than a few seconds. Pt also got OOB earlier in shift, incontinent of urine and stool. Safety sitter at bedside; he was cooperative with bath and bed change. Pts symptoms discussed with Dr. Aleskerov as well as Dr. Dorinda.
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
## 2024-07-07 NOTE — Progress Notes (Signed)
 Blood pressure continues to remain low despite interventions, so intensivist consult was placed and the patient was initiated on a Levophed  drip.  Poison Control called and updated on currrent labwork, ECG, vital signs, and plan of care. They are in alignment with the ongoing interventions at this time.  Arlean FORBES [name], RN

## 2024-07-07 NOTE — TOC CM/SW Note (Signed)
..  Transition of Care [facility]) - Inpatient Brief Assessment   Patient Details  Name: Gabriel [name] MRN: [id number] Date of Birth: 1976-10-17  Transition of Care [facility]) CM/SW Contact:    Edsel DELENA Fischer, LCSW Phone Number: 07/07/2024, 11:25 AM   Clinical Narrative:  TOC added SU information to AVS  Transition of Care Asessment:

## 2024-07-07 NOTE — Progress Notes (Signed)
 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  The patient developed hypotension despite hydration and was referred to critical care for further management.  On examination, he is tachycardic,-both tensive, saturating 99% on room air.  He is now on low-dose norepinephrine  infusion.  Results show acute kidney injury.  UDS positive for amphetamines and cocaine.  EKG with sinus tachycardia and QT prolongation  eICU Interventions  Agree with vasopressors, maintain MAP greater than 65  Consider renal consultation for urgent dialysis in the setting of severe toxicity  Consider the addition of empiric PPIs  DVT prophylaxis with heparin  GI prophylaxis not indicated     Intervention Category Evaluation Type: New Patient Evaluation  Gabriel [name] 07/07/2024, 4:03 AM

## 2024-07-07 NOTE — Discharge Instructions (Addendum)
 Substance Use Resources  http://lester.info/  SAMHSA's National Helpline is a free, confidential, 24/7, 365-day-a-year treatment referral and information service (in [place] and [place]) for individuals and families facing mental and/or substance use disorders.  1-800-662-HELP (4357)  If you need to talk, the 68 Lifeline is here. At the 988 Suicide & Crisis Lifeline, we understand that life's challenges can sometimes be difficult. Whether you're facing mental health struggles, emotional distress, alcohol or drug use concerns, or just need someone to talk to, our caring counselors are here for you. You are not alone. For immediate help from a caring, skilled counselor, please call or text 988 or chat with us . For life-threatening emergencies, call 911 and ask for a [employer] Estate agent. They have special training.  Mobile Crisis Management services provide intensive, on-site response, stabilization, and intervention for individuals of all ages who are experiencing a crisis related to mental health disturbances, developmental disabilities, or addiction. Our team of behavioral health professionals is available 24/7/365 to provide confidential and secure stabilization services for individuals in their homes, workplaces, schools, or anywhere within the community where a crisis occurs: Armed forces technical officer Crisis symptoms may include: Hearing voices Experiencing hallucinations Displaying irrational behavior Expressing intent to harm oneself or others Intravenous (IV) drug use Withdrawal from illegal substance use Using drugs while pregnant Becoming unmanageable due to mental illness Call [phone] for [facility].  [facility] Behavioral health providers Family members & self-referrals Medicaid (VAYA, Alliance, [place], Partners, Tourist information centre manager, and Blue Cross Blue Shield - Healthy Blue) For admission  inquiries: Detox & Crisis Services: [phone] [facility]: [phone] [address] [facility]: [phone] Main Location: [address] [place], [place] [address] Admission: [phone] [email or web address]  Ryan NOVAK. [facility] [address] [place], [place], [place] [address] Phone: [phone]  [facility] [address] [place], [place] [address] [phone]   [facility] [address] [place], [address] [place], [place] [address] [phone]  [facility] Crisis Line 24 hours a day, 7 days a week [phone]  [facility]: [facility], 24/7 [facility] [address], [place], [place] [address] [phone]

## 2024-07-07 NOTE — Progress Notes (Addendum)
 2130: Patient's BP soft at 85/54 (64). Discussed with Dr. Cleatus and received orders for 500 mL NS bolus. After bolus, BP slightly improved, maintaining MAPs of >65.  0130: Patient's BP dropping again with pressure of 79/40 (51), no longer maintaining MAP goal. 5 mg midodrine  ordered and given to the patient.  Arlean FORBES [name], RN

## 2024-07-08 DIAGNOSIS — N179 Acute kidney failure, unspecified: Secondary | ICD-10-CM

## 2024-07-08 DIAGNOSIS — F1510 Other stimulant abuse, uncomplicated: Secondary | ICD-10-CM

## 2024-07-08 DIAGNOSIS — R45851 Suicidal ideations: Secondary | ICD-10-CM

## 2024-07-08 DIAGNOSIS — G9341 Metabolic encephalopathy: Secondary | ICD-10-CM

## 2024-07-08 DIAGNOSIS — F1490 Cocaine use, unspecified, uncomplicated: Secondary | ICD-10-CM

## 2024-07-08 DIAGNOSIS — D72829 Elevated white blood cell count, unspecified: Secondary | ICD-10-CM

## 2024-07-08 LAB — HELPER T-LYMPH-CD4 (ARMC ONLY)
% CD 4 Pos. Lymph.: 23.4 % — ABNORMAL LOW (ref 30.8–58.5)
Absolute CD 4 Helper: 515 /uL (ref 359–1519)
Basophils Absolute: 0.1 x10E3/uL (ref 0.0–0.2)
Basos: 1 %
EOS (ABSOLUTE): 0.1 x10E3/uL (ref 0.0–0.4)
Eos: 1 %
Hematocrit: 35.9 % — ABNORMAL LOW (ref 37.5–51.0)
Hemoglobin: 11.7 g/dL — ABNORMAL LOW (ref 13.0–17.7)
Immature Grans (Abs): 0 x10E3/uL (ref 0.0–0.1)
Immature Granulocytes: 0 %
Lymphocytes Absolute: 2.2 x10E3/uL (ref 0.7–3.1)
Lymphs: 27 %
MCH: 31.3 pg (ref 26.6–33.0)
MCHC: 32.6 g/dL (ref 31.5–35.7)
MCV: 96 fL (ref 79–97)
Monocytes Absolute: 0.3 x10E3/uL (ref 0.1–0.9)
Monocytes: 3 %
Neutrophils Absolute: 5.7 x10E3/uL (ref 1.4–7.0)
Neutrophils: 68 %
Platelets: 177 x10E3/uL (ref 150–450)
RBC: 3.74 x10E6/uL — ABNORMAL LOW (ref 4.14–5.80)
RDW: 14.4 % (ref 11.6–15.4)
WBC: 8.5 x10E3/uL (ref 3.4–10.8)

## 2024-07-08 LAB — BASIC METABOLIC PANEL WITH GFR
Anion gap: 9 (ref 5–15)
Anion gap: 10 (ref 5–15)
BUN: 19 mg/dL (ref 6–20)
BUN: 16 mg/dL (ref 6–20)
CO2: 24 mmol/L (ref 22–32)
CO2: 24 mmol/L (ref 22–32)
Calcium: 7.8 mg/dL — ABNORMAL LOW (ref 8.9–10.3)
Calcium: 8.4 mg/dL — ABNORMAL LOW (ref 8.9–10.3)
Chloride: 107 mmol/L (ref 98–111)
Chloride: 106 mmol/L (ref 98–111)
Creatinine, Ser: 1.21 mg/dL (ref 0.61–1.24)
Creatinine, Ser: 1.16 mg/dL (ref 0.61–1.24)
GFR, Estimated: >60 mL/min (ref >60)
GFR, Estimated: >60 mL/min (ref >60)
Glucose, Bld: 142 mg/dL — ABNORMAL HIGH (ref 70–99)
Glucose, Bld: 135 mg/dL — ABNORMAL HIGH (ref 70–99)
Potassium: 3.8 mmol/L (ref 3.5–5.1)
Potassium: 4.1 mmol/L (ref 3.5–5.1)
Sodium: 140 mmol/L (ref 135–145)
Sodium: 140 mmol/L (ref 135–145)

## 2024-07-08 LAB — CK: Total CK: 790 U/L — ABNORMAL HIGH (ref 49–397)

## 2024-07-08 LAB — BLOOD GAS, VENOUS
Acid-base deficit: 1.4 mmol/L (ref 0.0–2.0)
Bicarbonate: 20.6 mmol/L (ref 20.0–28.0)
Patient temperature: 37
pCO2, Ven: 27 mmHg — ABNORMAL LOW (ref 44–60)
pH, Ven: 7.49 — ABNORMAL HIGH (ref 7.25–7.43)
pO2, Ven: 66 mmHg — ABNORMAL HIGH (ref 32–45)

## 2024-07-08 MED ORDER — LACTATED RINGERS IV SOLN: INTRAVENOUS | Status: DC

## 2024-07-08 MED ORDER — ENOXAPARIN SODIUM 40 MG/0.4ML IJ SOSY
40.0000 mg | PREFILLED_SYRINGE | INTRAMUSCULAR | Status: DC
  Administered 2024-07-08: 40 mg via SUBCUTANEOUS
  Filled 2024-07-08: qty 0.4

## 2024-07-08 MED ORDER — DIAZEPAM 5 MG/ML IJ SOLN
5.0000 mg | Freq: Four times a day (QID) | INTRAMUSCULAR | Status: DC | PRN
  Administered 2024-07-08 – 2024-07-09 (×2): 5 mg via INTRAVENOUS
  Filled 2024-07-08 (×2): qty 2
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
## 2024-07-08 NOTE — Progress Notes (Signed)
 Received call from poison control requesting update on this patient's condition. Update given, including recent labs, patient's mental status, and physical symptoms. Received recommendation for repeat CK level, as yesterday's level was elevated at 1789. Dr. Isadora was notified, and CK ordered to be drawn this afternoon. If downward trend is confirmed there is no further need for follow-up with Poison Control. If there is upward trend of CK, they have requested to be notified. Dr. Isadora notified.

## 2024-07-08 NOTE — Progress Notes (Signed)
 Dr Cleatus and Almarie PIETY (CCM) notified that patient wants to leave AMA.pt is off levophed  this AM. The patient expressed that he wants to try to harm himself again. I told him he needs to stay in the hospital because he is sick and he cannot try to kill himself and he agreed to stay. However, This AM he started saying the same thing that he wants lo leave AMA and that he might try to harm himself again. Medical team notified.

## 2024-07-08 NOTE — Consult Note (Signed)
 [facility] Consult Note:07/07/24    Date of Admission:  07/06/2024           Reason for Consult: Kidney injury    Referring Provider: Isadora Hose, MD Primary Care Provider: Pcp, No   History of Presenting Illness:  Gabriel [name] is a 48 y.o. male with medical problems of HIV, smoking, IV drug abuse presented to the emergency room complaining of GI upset after drug use.  Patient is critically ill and not able to provide details.  Most of the information is obtained from the chart.  He does report that he drank 3 cups of boric acid at home as well as took a bottle of trazodone .  He also admits to using IV metabutethamine and cocaine.  This patient has previous suicide attempts as outlined in H&P.  Sitting up in the bed.  Still requiring sitter to be at bedside.  Answering few simple questions but not able to carry a conversation.  Appears restless and somewhat agitated.  Urine output have improved significantly to 4500 cc yesterday.  Serum creatinine is down to 1.2.  Sitter in the room reports that he was able to drink some juice but overall his appetite is low.   Review of Systems: ROS-unreliable  Past Medical History:  Diagnosis Date   Abscess    buttocks   Cellulitis    HIV positive (HCC)    MRSA infection     Social History   Tobacco Use   Smoking status: Every Day    Current packs/day: 1.50    Types: Cigarettes   Smokeless tobacco: Never  Vaping Use   Vaping status: Never Used  Substance Use Topics   Alcohol use: Yes    Alcohol/week: 3.0 standard drinks of alcohol    Types: 3 Standard drinks or equivalent per week    Comment: socially    Drug use: Not Currently    Types: Cocaine, IV, Methamphetamines    Comment: only meth at this point    Family History  Problem Relation Age of Onset   Hypertension Mother    Cancer Maternal Grandmother    Crohn's disease Brother      OBJECTIVE: Blood pressure (!) 89/68, pulse 87, temperature 98.2  F (36.8 C), temperature source Axillary, resp. rate (!) 28, height 6' 6 (1.981 m), weight 107.4 kg, SpO2 95%.  Physical Exam General appearance-somewhat restless HEENT-moist oral mucous membranes, anicteric Pulmonary-normal breathing effort Cardiac-tachycardic, no rub Abdomen-soft, mild midepigastric tenderness Extremities-no significant peripheral edema Neuro-restless, mildly agitated Skin-warm, dry Musculoskeletal-no acute joint swellings or effusions are noted.  Lab Results Lab Results  Component Value Date   WBC 10.5 07/07/2024   HGB 12.7 (L) 07/07/2024   HCT 37.4 (L) 07/07/2024   MCV 93.3 07/07/2024   PLT 183 07/07/2024    Lab Results  Component Value Date   CREATININE 1.21 07/08/2024   BUN 19 07/08/2024   NA 140 07/08/2024   K 3.8 07/08/2024   CL 107 07/08/2024   CO2 24 07/08/2024    Lab Results  Component Value Date   ALT 28 07/07/2024   AST 46 (H) 07/07/2024   ALKPHOS 59 07/07/2024   BILITOT 0.9 07/07/2024     Microbiology: Recent Results (from the past 240 hours)  MRSA Next Gen by PCR, Nasal     Status: None   Collection Time: 07/06/24  7:07 PM   Specimen: Nasal Mucosa; Nasal Swab  Result Value Ref Range Status   MRSA by PCR Next  Gen NOT DETECTED NOT DETECTED Final    Comment: (NOTE) The GeneXpert MRSA Assay (FDA approved for NASAL specimens only), is one component of a comprehensive MRSA colonization surveillance program. It is not intended to diagnose MRSA infection nor to guide or monitor treatment for MRSA infections. Test performance is not FDA approved in patients less than 15 years old. Performed at [facility], [address] [place], [place] [address]   Culture, blood (Routine X 2) w Reflex to ID Panel     Status: None (Preliminary result)   Collection Time: 07/07/24  6:50 AM   Specimen: BLOOD  Result Value Ref Range Status   Specimen Description BLOOD BLOOD LEFT HAND  Final   Special Requests   Final    BOTTLES DRAWN  AEROBIC AND ANAEROBIC Blood Culture results may not be optimal due to an inadequate volume of blood received in culture bottles   Culture   Final    NO GROWTH < 24 HOURS Performed at [facility], [address] [place], [place] [address]    Report Status PENDING  Incomplete  Culture, blood (Routine X 2) w Reflex to ID Panel     Status: None (Preliminary result)   Collection Time: 07/07/24  6:50 AM   Specimen: BLOOD  Result Value Ref Range Status   Specimen Description BLOOD BLOOD LEFT HAND  Final   Special Requests   Final    BOTTLES DRAWN AEROBIC AND ANAEROBIC Blood Culture results may not be optimal due to an inadequate volume of blood received in culture bottles   Culture   Final    NO GROWTH < 24 HOURS Performed at [facility], [address] [place], [place] [address]    Report Status PENDING  Incomplete  C Difficile Quick Screen w PCR reflex     Status: None   Collection Time: 07/07/24 12:52 PM   Specimen: STOOL  Result Value Ref Range Status   C Diff antigen NEGATIVE NEGATIVE Final   C Diff toxin NEGATIVE NEGATIVE Final   C Diff interpretation No C. difficile detected.  Final    Comment: Performed at [facility], [address] [place], [place] [address]    Medications: Scheduled Meds:  chlordiazePOXIDE   25 mg Oral TID   Followed by   [START ON 07/09/2024] chlordiazePOXIDE   25 mg Oral BH-qamhs   Followed by   NOREEN ON 07/10/2024] chlordiazePOXIDE   25 mg Oral Daily   Chlorhexidine  Gluconate Cloth  6 each Topical Daily   enoxaparin  (LOVENOX ) injection  40 mg Subcutaneous Q24H   folic acid   1 mg Oral Daily   midodrine   15 mg Oral TID WC   multivitamin with minerals  1 tablet Oral Daily   pantoprazole  (PROTONIX ) IV  40 mg Intravenous Q12H   thiamine   100 mg Oral Daily   Or   thiamine   100 mg Intravenous Daily   Continuous Infusions:  lactated ringers  100 mL/hr at 07/08/24 1000   PRN Meds:.acetaminophen  **OR** acetaminophen ,  chlordiazePOXIDE , diazepam , hydrOXYzine , loperamide , ondansetron  **OR** ondansetron  (ZOFRAN ) IV, mouth rinse, polyethylene glycol  Allergies  Allergen Reactions   Abacavir Other (See Comments)    HLA [id number] positive - should not receive abacavir due to risk of hypersensitivity    Urinalysis: Recent Labs    07/06/24 2020  COLORURINE AMBER*  LABSPEC 1.014  PHURINE 5.0  GLUCOSEU NEGATIVE  HGBUR NEGATIVE  BILIRUBINUR NEGATIVE  KETONESUR NEGATIVE  PROTEINUR 100*  NITRITE NEGATIVE  LEUKOCYTESUR TRACE*  Imaging: US  EKG SITE RITE Result Date: 07/07/2024 If Site Rite image not attached, placement could not be confirmed due to current cardiac rhythm.  CT ABDOMEN PELVIS WO CONTRAST Result Date: 07/06/2024 EXAM: CT ABDOMEN AND PELVIS WITHOUT CONTRAST 07/06/2024 04:57:10 PM TECHNIQUE: CT of the abdomen and pelvis was performed without the administration of intravenous contrast. Multiplanar reformatted images are provided for review. Automated exposure control, iterative reconstruction, and/or weight-based adjustment of the mA/kV was utilized to reduce the radiation dose to as low as reasonably achievable. COMPARISON: 04/24/2026 and previous. CLINICAL HISTORY: Acute renal failure. N/V/D after boric acid ingestion for SI attempt. Diffuse abd pain. Pt via [facility] from home. Pt took 3 cupfuls of boric acid roach powder sometime yesterday afternoon/evening. Unable to give an exact time. Also states he to 10 50mg  Trazodone  throughout the night. Report he did do this to harm himself. Denies any SI at this time. Pt also uses meth and cocaine, last use was sometimes yesterday. Denies ETOH. Pt is A\T\Ox4 and NAD. FINDINGS: LOWER CHEST: No acute abnormality. LIVER: The liver is unremarkable. GALLBLADDER AND BILE DUCTS: Gallbladder is unremarkable. No biliary ductal dilatation. SPLEEN: No acute abnormality. PANCREAS: No acute abnormality. ADRENAL GLANDS: No acute abnormality. KIDNEYS, URETERS AND BLADDER: No  stones in the kidneys or ureters. No hydronephrosis. No perinephric or periureteral stranding. Urinary bladder is nondistended. GI AND BOWEL: Stomach demonstrates no acute abnormality. There is no bowel obstruction. PERITONEUM AND RETROPERITONEUM: No ascites. No free air. VASCULATURE: Scattered aortoiliac calcified plaque without AAA. LYMPH NODES: No lymphadenopathy. REPRODUCTIVE ORGANS: No acute abnormality. BONES AND SOFT TISSUES: Bilateral hip DJD. Bilateral pelvic phleboliths. No acute osseous abnormality. No focal soft tissue abnormality. IMPRESSION: 1. No acute findings in the abdomen or pelvis. Electronically signed by: Katheleen Faes MD 07/06/2024 05:01 PM EDT RP Workstation: [id number]      Assessment/Plan:  Gabriel [name] is a 48 y.o. male with medical problems of HIV, smoking, IV drug abuse, previous suicide attempts   was admitted on 07/06/2024 for :  Drug overdose [T50.901A] Suicidal thoughts [R45.851] Polysubstance abuse (HCC) [F19.10] AKI (acute kidney injury) (HCC) [N17.9]  1.  Acute kidney injury Likely secondary to hypotension.  Salicylate and acetaminophen  level are negative.  Urine drug screen is positive for amphetamines and cocaine. CT abdomen pelvis without contrast shows liver is unremarkable, no abnormalities are noted in the kidneys. Urinalysis is negative for blood, some protein is noted on dipstick. Serum creatinine and urine output have improved with IV hydration. Continue supportive care. Serum creatinine is now close to baseline.  Electrolytes have corrected.  We will sign off.  Please reconsult as necessary.  2.  Hypokalemia This was noted at admission but has now corrected.  3.  Proteinuria Noted on dipstick. Protein to creatinine ratio is normal.  4.  Hypotension Required pressors.   Maitland Lesiak Dennise 07/08/24
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
## 2024-07-08 NOTE — Plan of Care (Signed)
  Problem: Education: Goal: Knowledge of General Education information will improve Description: Including pain rating scale, medication(s)/side effects and non-pharmacologic comfort measures Outcome: Progressing   Problem: Clinical Measurements: Goal: Ability to maintain clinical measurements within normal limits will improve Outcome: Progressing Goal: Will remain free from infection Outcome: Progressing Goal: Diagnostic test results will improve Outcome: Progressing Goal: Respiratory complications will improve Outcome: Progressing Goal: Cardiovascular complication will be avoided Outcome: Progressing   Problem: Activity: Goal: Risk for activity intolerance will decrease Outcome: Progressing   Problem: Coping: Goal: Level of anxiety will decrease Outcome: Progressing   Problem: Pain Managment: Goal: General experience of comfort will improve and/or be controlled Outcome: Progressing   Problem: Safety: Goal: Ability to remain free from injury will improve Outcome: Progressing   Problem: Skin Integrity: Goal: Risk for impaired skin integrity will decrease Outcome: Progressing

## 2024-07-08 NOTE — BH Assessment (Signed)
 PT  placed  under  IVC  papers  informed   Jon PEAK

## 2024-07-09 ENCOUNTER — Inpatient Hospital Stay (HOSPITAL_COMMUNITY)
Admission: AD | Admit: 2024-07-09 | Discharge: 2024-07-10 | DRG: 871 | Disposition: A | Discharge status: Other Acute Inpatient Hospital | Source: Intra-hospital | Attending: Psychiatry | Admitting: Psychiatry

## 2024-07-09 ENCOUNTER — Inpatient Hospital Stay (HOSPITAL_COMMUNITY)

## 2024-07-09 ENCOUNTER — Inpatient Hospital Stay (HOSPITAL_COMMUNITY): Admitting: Certified Registered Nurse Anesthetist

## 2024-07-09 ENCOUNTER — Other Ambulatory Visit: Payer: Self-pay | Admitting: Student

## 2024-07-09 ENCOUNTER — Other Ambulatory Visit: Payer: Self-pay

## 2024-07-09 ENCOUNTER — Encounter (HOSPITAL_COMMUNITY): Payer: Self-pay | Admitting: Psychiatry

## 2024-07-09 ENCOUNTER — Encounter (HOSPITAL_COMMUNITY)
Admission: AD | Disposition: A | Discharge status: Other Acute Inpatient Hospital | Payer: Self-pay | Source: Intra-hospital

## 2024-07-09 ENCOUNTER — Encounter (HOSPITAL_COMMUNITY): Payer: Self-pay | Admitting: Emergency Medicine

## 2024-07-09 DIAGNOSIS — Z888 Allergy status to other drugs, medicaments and biological substances status: Secondary | ICD-10-CM | POA: Diagnosis not present

## 2024-07-09 DIAGNOSIS — F1994 Other psychoactive substance use, unspecified with psychoactive substance-induced mood disorder: Secondary | ICD-10-CM | POA: Diagnosis present

## 2024-07-09 DIAGNOSIS — J449 Chronic obstructive pulmonary disease, unspecified: Secondary | ICD-10-CM | POA: Diagnosis not present

## 2024-07-09 DIAGNOSIS — R21 Rash and other nonspecific skin eruption: Secondary | ICD-10-CM | POA: Diagnosis present

## 2024-07-09 DIAGNOSIS — F1914 Other psychoactive substance abuse with psychoactive substance-induced mood disorder: Secondary | ICD-10-CM | POA: Diagnosis present

## 2024-07-09 DIAGNOSIS — A419 Sepsis, unspecified organism: Secondary | ICD-10-CM | POA: Diagnosis present

## 2024-07-09 DIAGNOSIS — Z79899 Other long term (current) drug therapy: Secondary | ICD-10-CM | POA: Diagnosis not present

## 2024-07-09 DIAGNOSIS — R404 Transient alteration of awareness: Secondary | ICD-10-CM | POA: Diagnosis not present

## 2024-07-09 DIAGNOSIS — Z8249 Family history of ischemic heart disease and other diseases of the circulatory system: Secondary | ICD-10-CM

## 2024-07-09 DIAGNOSIS — R4182 Altered mental status, unspecified: Secondary | ICD-10-CM | POA: Diagnosis not present

## 2024-07-09 DIAGNOSIS — T50902A Poisoning by unspecified drugs, medicaments and biological substances, intentional self-harm, initial encounter: Secondary | ICD-10-CM

## 2024-07-09 DIAGNOSIS — T783XXA Angioneurotic edema, initial encounter: Secondary | ICD-10-CM | POA: Diagnosis present

## 2024-07-09 DIAGNOSIS — J9600 Acute respiratory failure, unspecified whether with hypoxia or hypercapnia: Secondary | ICD-10-CM | POA: Diagnosis present

## 2024-07-09 DIAGNOSIS — R0603 Acute respiratory distress: Secondary | ICD-10-CM

## 2024-07-09 DIAGNOSIS — T490X5A Adverse effect of local antifungal, anti-infective and anti-inflammatory drugs, initial encounter: Secondary | ICD-10-CM

## 2024-07-09 DIAGNOSIS — T490X2D Poisoning by local antifungal, anti-infective and anti-inflammatory drugs, intentional self-harm, subsequent encounter: Secondary | ICD-10-CM | POA: Diagnosis not present

## 2024-07-09 DIAGNOSIS — I959 Hypotension, unspecified: Secondary | ICD-10-CM | POA: Diagnosis present

## 2024-07-09 DIAGNOSIS — Z7401 Bed confinement status: Secondary | ICD-10-CM | POA: Diagnosis not present

## 2024-07-09 DIAGNOSIS — Z21 Asymptomatic human immunodeficiency virus [HIV] infection status: Secondary | ICD-10-CM | POA: Diagnosis present

## 2024-07-09 DIAGNOSIS — Z8614 Personal history of Methicillin resistant Staphylococcus aureus infection: Secondary | ICD-10-CM | POA: Diagnosis not present

## 2024-07-09 DIAGNOSIS — F17210 Nicotine dependence, cigarettes, uncomplicated: Secondary | ICD-10-CM | POA: Diagnosis present

## 2024-07-09 DIAGNOSIS — R220 Localized swelling, mass and lump, head: Principal | ICD-10-CM

## 2024-07-09 HISTORY — PX: TRACHEOSTOMY TUBE PLACEMENT: SHX814

## 2024-07-09 LAB — COMPREHENSIVE METABOLIC PANEL WITH GFR
ALT: 35 U/L (ref 0–44)
AST: 26 U/L (ref 15–41)
Albumin: 4.4 g/dL (ref 3.5–5.0)
Alkaline Phosphatase: 102 U/L (ref 38–126)
Anion gap: 18 — ABNORMAL HIGH (ref 5–15)
BUN: 11 mg/dL (ref 6–20)
CO2: 19 mmol/L — ABNORMAL LOW (ref 22–32)
Calcium: 10.1 mg/dL (ref 8.9–10.3)
Chloride: 102 mmol/L (ref 98–111)
Creatinine, Ser: 1.01 mg/dL (ref 0.61–1.24)
GFR, Estimated: >60 mL/min (ref >60)
Glucose, Bld: 133 mg/dL — ABNORMAL HIGH (ref 70–99)
Potassium: 4.1 mmol/L (ref 3.5–5.1)
Sodium: 138 mmol/L (ref 135–145)
Total Bilirubin: 1.4 mg/dL — ABNORMAL HIGH (ref 0.0–1.2)
Total Protein: 7.9 g/dL (ref 6.5–8.1)

## 2024-07-09 LAB — CBC
HCT: 36.4 % — ABNORMAL LOW (ref 39.0–52.0)
Hemoglobin: 12.1 g/dL — ABNORMAL LOW (ref 13.0–17.0)
MCH: 31.3 pg (ref 26.0–34.0)
MCHC: 33.2 g/dL (ref 30.0–36.0)
MCV: 94.1 fL (ref 80.0–100.0)
Platelets: 159 K/uL (ref 150–400)
RBC: 3.87 MIL/uL — ABNORMAL LOW (ref 4.22–5.81)
RDW: 14.4 % (ref 11.5–15.5)
WBC: 6.2 K/uL (ref 4.0–10.5)
nRBC: 0 % (ref 0.0–0.2)

## 2024-07-09 LAB — CBC WITH DIFFERENTIAL/PLATELET
Abs Immature Granulocytes: 0.03 K/uL (ref 0.00–0.07)
Basophils Absolute: 0 K/uL (ref 0.0–0.1)
Basophils Relative: 0 %
Eosinophils Absolute: 0 K/uL (ref 0.0–0.5)
Eosinophils Relative: 0 %
HCT: 40.5 % (ref 39.0–52.0)
Hemoglobin: 13.3 g/dL (ref 13.0–17.0)
Immature Granulocytes: 0 %
Lymphocytes Relative: 6 %
Lymphs Abs: 0.5 K/uL — ABNORMAL LOW (ref 0.7–4.0)
MCH: 31 pg (ref 26.0–34.0)
MCHC: 32.8 g/dL (ref 30.0–36.0)
MCV: 94.4 fL (ref 80.0–100.0)
Monocytes Absolute: 0.1 K/uL (ref 0.1–1.0)
Monocytes Relative: 2 %
Neutro Abs: 7.6 K/uL (ref 1.7–7.7)
Neutrophils Relative %: 92 %
Platelets: 165 K/uL (ref 150–400)
RBC: 4.29 MIL/uL (ref 4.22–5.81)
RDW: 14.5 % (ref 11.5–15.5)
WBC: 8.3 K/uL (ref 4.0–10.5)
nRBC: 0 % (ref 0.0–0.2)

## 2024-07-09 LAB — MAGNESIUM: Magnesium: 1.7 mg/dL (ref 1.7–2.4)

## 2024-07-09 LAB — BASIC METABOLIC PANEL WITH GFR
Anion gap: 8 (ref 5–15)
BUN: 12 mg/dL (ref 6–20)
CO2: 23 mmol/L (ref 22–32)
Calcium: 9.1 mg/dL (ref 8.9–10.3)
Chloride: 107 mmol/L (ref 98–111)
Creatinine, Ser: 0.96 mg/dL (ref 0.61–1.24)
GFR, Estimated: >60 mL/min (ref >60)
Glucose, Bld: 117 mg/dL — ABNORMAL HIGH (ref 70–99)
Potassium: 3.5 mmol/L (ref 3.5–5.1)
Sodium: 138 mmol/L (ref 135–145)

## 2024-07-09 LAB — PHOSPHORUS: Phosphorus: 2.1 mg/dL — ABNORMAL LOW (ref 2.5–4.6)

## 2024-07-09 LAB — TSH: TSH: 1.009 u[IU]/mL (ref 0.350–4.500)

## 2024-07-09 LAB — VITAMIN D 25 HYDROXY (VIT D DEFICIENCY, FRACTURES): Vit D, 25-Hydroxy: 15.86 ng/mL — ABNORMAL LOW (ref 30–100)

## 2024-07-09 LAB — VITAMIN B12: Vitamin B-12: 902 pg/mL (ref 180–914)

## 2024-07-09 LAB — FOLATE: Folate: 14.9 ng/mL (ref >5.9)

## 2024-07-09 LAB — IRON AND TIBC
Iron: 124 ug/dL (ref 45–182)
Saturation Ratios: 48 % — ABNORMAL HIGH (ref 17.9–39.5)
TIBC: 260 ug/dL (ref 250–450)
UIBC: 136 ug/dL

## 2024-07-09 LAB — CG4 I-STAT (LACTIC ACID): Lactic Acid, Venous: 1.1 mmol/L (ref 0.5–1.9)

## 2024-07-09 LAB — CK: Total CK: 429 U/L — ABNORMAL HIGH (ref 49–397)

## 2024-07-09 SURGERY — CREATION, TRACHEOSTOMY
Anesthesia: General

## 2024-07-09 MED ORDER — POLYETHYLENE GLYCOL 3350 17 G PO PACK: 17.0000 g | PACK | Freq: Every day | ORAL | Status: DC | PRN

## 2024-07-09 MED ORDER — ROCURONIUM BROMIDE 10 MG/ML (PF) SYRINGE
PREFILLED_SYRINGE | INTRAVENOUS | Status: DC | PRN
  Administered 2024-07-09: 50 mg via INTRAVENOUS

## 2024-07-09 MED ORDER — K PHOS MONO-SOD PHOS DI & MONO 155-852-130 MG PO TABS
500.0000 mg | ORAL_TABLET | Freq: Three times a day (TID) | ORAL | Status: DC
  Administered 2024-07-09: 500 mg via ORAL
  Filled 2024-07-09 (×3): qty 2

## 2024-07-09 MED ORDER — FENTANYL CITRATE PF 50 MCG/ML IJ SOSY
50.0000 ug | PREFILLED_SYRINGE | Freq: Once | INTRAMUSCULAR | Status: AC
  Administered 2024-07-09: 50 ug via INTRAVENOUS
  Filled 2024-07-09: qty 1

## 2024-07-09 MED ORDER — DEXMEDETOMIDINE HCL IN NACL 80 MCG/20ML IV SOLN
INTRAVENOUS | Status: DC | PRN
  Administered 2024-07-09: 3.5 ug/kg/h via INTRAVENOUS
  Administered 2024-07-09 (×2): 16 ug via INTRAVENOUS

## 2024-07-09 MED ORDER — ADULT MULTIVITAMIN W/MINERALS CH: 1.0000 | ORAL_TABLET | Freq: Every day | ORAL | Status: DC

## 2024-07-09 MED ORDER — CHLORDIAZEPOXIDE HCL 25 MG PO CAPS: ORAL_CAPSULE | ORAL | Status: AC

## 2024-07-09 MED ORDER — NOREPINEPHRINE 4 MG/250ML-% IV SOLN
0.0000 ug/min | INTRAVENOUS | Status: DC
  Administered 2024-07-09: 2 ug/min via INTRAVENOUS
  Filled 2024-07-09: qty 250

## 2024-07-09 MED ORDER — HYDROXYZINE HCL 25 MG PO TABS: 25.0000 mg | ORAL_TABLET | Freq: Four times a day (QID) | ORAL | Status: DC | PRN

## 2024-07-09 MED ORDER — PANTOPRAZOLE SODIUM 40 MG PO TBEC: 40.0000 mg | DELAYED_RELEASE_TABLET | Freq: Two times a day (BID) | ORAL | Status: DC

## 2024-07-09 MED ORDER — OLANZAPINE 10 MG IM SOLR: 5.0000 mg | Freq: Three times a day (TID) | INTRAMUSCULAR | Status: DC | PRN

## 2024-07-09 MED ORDER — DEXAMETHASONE SODIUM PHOSPHATE 10 MG/ML IJ SOLN
10.0000 mg | Freq: Once | INTRAMUSCULAR | Status: AC
  Administered 2024-07-09: 10 mg via INTRAVENOUS
  Filled 2024-07-09: qty 1

## 2024-07-09 MED ORDER — LIDOCAINE HCL (PF) 4 % IJ SOLN
INTRAMUSCULAR | Status: DC | PRN
  Administered 2024-07-09: 5 mL

## 2024-07-09 MED ORDER — CHLORDIAZEPOXIDE HCL 25 MG PO CAPS
25.0000 mg | ORAL_CAPSULE | Freq: Every day | ORAL | Status: DC
Start: 2024-07-10 — End: 2024-07-11

## 2024-07-09 MED ORDER — VITAMIN D (ERGOCALCIFEROL) 1.25 MG (50000 UNIT) PO CAPS
50000.0000 [IU] | ORAL_CAPSULE | ORAL | 0 refills | Status: AC
Start: 2024-07-09 — End: 2024-10-07

## 2024-07-09 MED ORDER — OLANZAPINE 5 MG PO TBDP: 5.0000 mg | ORAL_TABLET | Freq: Three times a day (TID) | ORAL | Status: DC | PRN

## 2024-07-09 MED ORDER — SODIUM CHLORIDE 0.9 % IV SOLN
2.0000 g | Freq: Once | INTRAVENOUS | Status: AC
  Administered 2024-07-09: 2 g via INTRAVENOUS
  Filled 2024-07-09: qty 12.5

## 2024-07-09 MED ORDER — LOPERAMIDE HCL 2 MG PO CAPS: 2.0000 mg | ORAL_CAPSULE | ORAL | Status: DC | PRN

## 2024-07-09 MED ORDER — FAMOTIDINE IN NACL 20-0.9 MG/50ML-% IV SOLN
20.0000 mg | Freq: Once | INTRAVENOUS | Status: AC
  Administered 2024-07-09: 20 mg via INTRAVENOUS
  Filled 2024-07-09: qty 50

## 2024-07-09 MED ORDER — LACTATED RINGERS IV BOLUS (SEPSIS): 500.0000 mL | Freq: Once | INTRAVENOUS | Status: DC

## 2024-07-09 MED ORDER — DEXMEDETOMIDINE HCL IN NACL 400 MCG/100ML IV SOLN
0.0000 ug/kg/h | INTRAVENOUS | Status: DC
  Administered 2024-07-09: 0.4 ug/kg/h via INTRAVENOUS

## 2024-07-09 MED ORDER — DEXMEDETOMIDINE HCL IN NACL 400 MCG/100ML IV SOLN
0.0000 ug/kg/h | INTRAVENOUS | Status: DC
  Administered 2024-07-10: 1.1 ug/kg/h via INTRAVENOUS
  Filled 2024-07-09: qty 100

## 2024-07-09 MED ORDER — BUTAMBEN-TETRACAINE-BENZOCAINE 2-2-14 % EX AERO
INHALATION_SPRAY | CUTANEOUS | Status: DC | PRN
  Administered 2024-07-09: 6 via TOPICAL

## 2024-07-09 MED ORDER — LACTATED RINGERS IV BOLUS (SEPSIS)
1000.0000 mL | Freq: Once | INTRAVENOUS | Status: AC
  Administered 2024-07-09: 1000 mL via INTRAVENOUS

## 2024-07-09 MED ORDER — FOLIC ACID 1 MG PO TABS: 1.0000 mg | ORAL_TABLET | Freq: Every day | ORAL | Status: DC

## 2024-07-09 MED ORDER — IOHEXOL 300 MG/ML  SOLN: 75.0000 mL | Freq: Once | INTRAMUSCULAR | Status: DC | PRN

## 2024-07-09 MED ORDER — KETAMINE HCL 10 MG/ML IJ SOLN
INTRAMUSCULAR | Status: DC | PRN
  Administered 2024-07-09: 20 mg via INTRAVENOUS
  Administered 2024-07-09: 10 mg via INTRAVENOUS
  Administered 2024-07-09: 20 mg via INTRAVENOUS

## 2024-07-09 MED ORDER — THIAMINE MONONITRATE 100 MG PO TABS: 100.0000 mg | ORAL_TABLET | Freq: Every day | ORAL | Status: DC

## 2024-07-09 MED ORDER — LIDOCAINE-EPINEPHRINE 2 %-1:100000 IJ SOLN
20.0000 mL | Freq: Once | INTRAMUSCULAR | Status: AC
  Administered 2024-07-09: 20 mL

## 2024-07-09 MED ORDER — GLYCOPYRROLATE 0.2 MG/ML IJ SOLN
INTRAMUSCULAR | Status: DC | PRN
  Administered 2024-07-09: .2 mg via INTRAVENOUS

## 2024-07-09 MED ORDER — VITAMIN B-1 100 MG PO TABS: 100.0000 mg | ORAL_TABLET | Freq: Every day | ORAL | Status: AC

## 2024-07-09 MED ORDER — ACETAMINOPHEN 325 MG PO TABS: 650.0000 mg | ORAL_TABLET | Freq: Four times a day (QID) | ORAL | Status: AC | PRN

## 2024-07-09 MED ORDER — FOLIC ACID 1 MG PO TABS: 1.0000 mg | ORAL_TABLET | Freq: Every day | ORAL | Status: AC

## 2024-07-09 MED ORDER — METRONIDAZOLE 500 MG/100ML IV SOLN
500.0000 mg | Freq: Once | INTRAVENOUS | Status: AC
Start: 2024-07-09 — End: 2024-07-10
  Administered 2024-07-10: 500 mg via INTRAVENOUS
  Filled 2024-07-09 (×2): qty 100

## 2024-07-09 MED ORDER — PANTOPRAZOLE SODIUM 40 MG PO TBEC: 40.0000 mg | DELAYED_RELEASE_TABLET | Freq: Every day | ORAL | Status: DC

## 2024-07-09 MED ORDER — EPINEPHRINE 0.3 MG/0.3ML IJ SOAJ
0.3000 mg | Freq: Once | INTRAMUSCULAR | Status: AC
  Administered 2024-07-09: 0.3 mg via INTRAMUSCULAR
  Filled 2024-07-09: qty 0.3

## 2024-07-09 MED ORDER — LACTATED RINGERS IV BOLUS (SEPSIS)
1000.0000 mL | Freq: Once | INTRAVENOUS | Status: AC
  Administered 2024-07-10: 1000 mL via INTRAVENOUS

## 2024-07-09 MED ORDER — LIDOCAINE-EPINEPHRINE (PF) 2 %-1:200000 IJ SOLN
INTRAMUSCULAR | Status: AC
  Filled 2024-07-09: qty 20

## 2024-07-09 MED ORDER — SUCCINYLCHOLINE CHLORIDE 200 MG/10ML IV SOSY
PREFILLED_SYRINGE | INTRAVENOUS | Status: DC | PRN
  Administered 2024-07-09: 150 mg via INTRAVENOUS

## 2024-07-09 MED ORDER — CHLORDIAZEPOXIDE HCL 25 MG PO CAPS: 25.0000 mg | ORAL_CAPSULE | ORAL | Status: DC

## 2024-07-09 MED ORDER — LACTATED RINGERS IV BOLUS (SEPSIS)
1000.0000 mL | Freq: Once | INTRAVENOUS | Status: AC
Start: 2024-07-09 — End: 2024-07-10
  Administered 2024-07-10: 1000 mL via INTRAVENOUS

## 2024-07-09 MED ORDER — POTASSIUM PHOSPHATES 15 MMOLE/5ML IV SOLN: 30.0000 mmol | Freq: Once | INTRAVENOUS | Status: DC

## 2024-07-09 MED ORDER — PROPOFOL 1000 MG/100ML IV EMUL
0.0000 ug/kg/min | INTRAVENOUS | Status: DC
  Filled 2024-07-09: qty 100

## 2024-07-09 MED ORDER — PHENYLEPHRINE HCL 10 % OP SOLN: Freq: Once | OPHTHALMIC | Status: DC

## 2024-07-09 MED ORDER — K PHOS MONO-SOD PHOS DI & MONO 155-852-130 MG PO TABS
500.0000 mg | ORAL_TABLET | Freq: Three times a day (TID) | ORAL | Status: DC
  Filled 2024-07-09: qty 2

## 2024-07-09 MED ORDER — ADULT MULTIVITAMIN W/MINERALS CH: 1.0000 | ORAL_TABLET | Freq: Every day | ORAL | Status: AC

## 2024-07-09 MED ORDER — VANCOMYCIN HCL IN DEXTROSE 1-5 GM/200ML-% IV SOLN
1000.0000 mg | Freq: Once | INTRAVENOUS | Status: AC
  Administered 2024-07-09: 1000 mg via INTRAVENOUS
  Filled 2024-07-09: qty 200

## 2024-07-09 MED ORDER — ACETAMINOPHEN 650 MG RE SUPP
650.0000 mg | Freq: Once | RECTAL | Status: AC
  Administered 2024-07-10: 650 mg via RECTAL
  Filled 2024-07-09: qty 1

## 2024-07-09 MED ORDER — RACEPINEPHRINE HCL 2.25 % IN NEBU
0.5000 mL | INHALATION_SOLUTION | Freq: Once | RESPIRATORY_TRACT | Status: AC
  Administered 2024-07-09: 0.5 mL via RESPIRATORY_TRACT
  Filled 2024-07-09: qty 15

## 2024-07-09 MED ORDER — KETAMINE HCL 50 MG/5ML IJ SOSY
PREFILLED_SYRINGE | INTRAMUSCULAR | Status: AC
  Filled 2024-07-09: qty 5

## 2024-07-09 MED ORDER — MIDODRINE HCL 5 MG PO TABS: 5.0000 mg | ORAL_TABLET | Freq: Three times a day (TID) | ORAL | Status: DC | PRN

## 2024-07-09 MED ORDER — OXYMETAZOLINE HCL 0.05 % NA SOLN
NASAL | Status: DC | PRN
  Administered 2024-07-09 (×2): 4 via NASAL

## 2024-07-09 MED ORDER — OLANZAPINE 10 MG IM SOLR: 10.0000 mg | Freq: Three times a day (TID) | INTRAMUSCULAR | Status: DC | PRN

## 2024-07-09 MED ORDER — ACETAMINOPHEN 325 MG PO TABS: 650.0000 mg | ORAL_TABLET | Freq: Four times a day (QID) | ORAL | Status: DC | PRN

## 2024-07-09 MED ORDER — PROPOFOL 500 MG/50ML IV EMUL
INTRAVENOUS | Status: DC | PRN
  Administered 2024-07-09: 50 ug/kg/min via INTRAVENOUS
  Administered 2024-07-09: 150 mg via INTRAVENOUS

## 2024-07-09 MED ORDER — LACTATED RINGERS IV SOLN: INTRAVENOUS | Status: DC

## 2024-07-09 MED ORDER — DIPHENHYDRAMINE HCL 50 MG/ML IJ SOLN
25.0000 mg | Freq: Once | INTRAMUSCULAR | Status: AC
  Administered 2024-07-09: 25 mg via INTRAVENOUS
  Filled 2024-07-09: qty 1

## 2024-07-09 SURGICAL SUPPLY — 26 items
ATTRACTOMAT 16X20 MAGNETIC DRP (DRAPES) IMPLANT
BAG COUNTER SPONGE SURGICOUNT (BAG) IMPLANT
BLADE SURG 15 STRL LF DISP TIS (BLADE) ×1 IMPLANT
BLADE SURG SZ11 CARB STEEL (BLADE) ×1 IMPLANT
CLEANER TIP ELECTROSURG 2X2 (MISCELLANEOUS) ×1 IMPLANT
COVER SURGICAL LIGHT HANDLE (MISCELLANEOUS) ×1 IMPLANT
DISSECTOR ROUND CHERRY 3/8 STR (MISCELLANEOUS) IMPLANT
ELECT COATED BLADE 2.86 ST (ELECTRODE) ×1 IMPLANT
ELECT REM PT RETURN 15FT ADLT (MISCELLANEOUS) ×1 IMPLANT
GAUZE 4X4 16PLY ~~LOC~~+RFID DBL (SPONGE) ×1 IMPLANT
GLOVE BIO SURGEON STRL SZ7.5 (GLOVE) ×1 IMPLANT
GOWN STRL REUS W/ TWL LRG LVL3 (GOWN DISPOSABLE) ×1 IMPLANT
KIT BASIN OR (CUSTOM PROCEDURE TRAY) ×1 IMPLANT
KIT TURNOVER KIT A (KITS) ×1 IMPLANT
PACK EENT SPLIT (PACKS) ×1 IMPLANT
PENCIL SMOKE EVACUATOR (MISCELLANEOUS) IMPLANT
SPONGE DRAIN TRACH 4X4 STRL 2S (GAUZE/BANDAGES/DRESSINGS) ×1 IMPLANT
SUT SILK 2 0 30 PSL (SUTURE) ×1 IMPLANT
SUT SILK 2 0 REEL (SUTURE) ×1 IMPLANT
SUT SILK 2 0 SH (SUTURE) ×1 IMPLANT
SUT SILK 2 0SH CR/8 30 (SUTURE) IMPLANT
SUT SILK 2-0 18XBRD TIE 12 (SUTURE) ×1 IMPLANT
SYR 10ML LL (SYRINGE) ×1 IMPLANT
SYR 20ML LL LF (SYRINGE) IMPLANT
TOWEL OR 17X26 10 PK STRL BLUE (TOWEL DISPOSABLE) ×1 IMPLANT
TUBE TRACH FLEX 6.0 UNCF (MISCELLANEOUS) IMPLANT

## 2024-07-09 NOTE — Progress Notes (Signed)
 PHARMACIST - PHYSICIAN COMMUNICATION  DR:   Von  CONCERNING: IV to Oral Route Change Policy  RECOMMENDATION: This patient is receiving pantoprazole  by the intravenous route.  Based on criteria approved by the Pharmacy and Therapeutics Committee, the intravenous medication(s) is/are being converted to the equivalent oral dose form(s).   DESCRIPTION: These criteria include: The patient is eating (either orally or via tube) and/or has been taking other orally administered medications for a least 24 hours The patient has no evidence of active gastrointestinal bleeding or impaired GI absorption (gastrectomy, short bowel, patient on TNA or NPO).  If you have questions about this conversion, please contact the Pharmacy Department  []   [phone] )  [facility] [x]   [phone] )  [facility] []   [phone] )  [facility] []   [phone] )  [facility] []   [phone] )  [facility]   Adriana JONETTA Bolster, [facility] 07/09/2024 11:28 AM

## 2024-07-09 NOTE — BH Assessment (Signed)
[place]  [employer] [facility]  called for  transport  to [facility]  beh med

## 2024-07-09 NOTE — Anesthesia Procedure Notes (Signed)
 Procedure Name: Awake intubation Date/Time: 07/09/2024 10:19 PM  Performed by: Merla Almarie HERO, DOPre-anesthesia Checklist: Patient identified, Emergency Drugs available, Suction available and Patient being monitored Patient Re-evaluated:Patient Re-evaluated prior to induction Oxygen Delivery Method: Circle system utilized Preoxygenation: Pre-oxygenation with 100% oxygen (limited pre-oxygenation d/t poor patient cooperation) Nasal Tubes: Nasal prep performed, Nasal Rae and Right Tube size: 7.0 mm Number of attempts: 1 Airway Equipment and Method: Fiberoptic brochoscope and LTA kit utilized Placement Confirmation: ETT inserted through vocal cords under direct vision, positive ETCO2 and breath sounds checked- equal and bilateral Secured at: 28 (at R nare) cm Tube secured with: Tape (sutured) Dental Injury: Teeth and Oropharynx as per pre-operative assessment  Difficulty Due To: Difficulty was anticipated, Difficult Airway-  due to edematous airway, Difficult Airway- due to limited oral opening and Difficult Airway- due to large tongue Future Recommendations: Recommend- induction with short-acting agent, and alternative techniques readily available Comments: Semi-awake intubation with fiberoptic scope through right nare after significant topicalization. Patient minimally cooperative, requiring sedation before attempting fiberoptic view of nasopharynx. Difficult due to copious thick secretions, massive airway edema and excoriated oropharyngeal mucosa.

## 2024-07-09 NOTE — ED Notes (Signed)
 Critical care called @ 2030

## 2024-07-09 NOTE — Progress Notes (Signed)
 Patient Discharged under IVC to [facility] for Inpatient unit. Released to [facility] Dept for transport.

## 2024-07-09 NOTE — Discharge Summary (Signed)
 [facility] Discharge Summary   Patient: Gabriel [name] FMW:[id number]  PCP: Pcp, No  Date of admission: 07/06/2024   Date of discharge:  07/09/2024     Discharge Diagnoses:  Principal Problem:   Drug overdose Active Problems:   Intentional overdose (HCC)   Methamphetamine use (HCC)   Cocaine use   Admitted From: Home Disposition:  BHU in [place], [place] as per psych  Recommendations for Outpatient Follow-up:  PCP: In 1 week after discharge Follow up LABS/TEST:     Follow-up Information     PCP Follow up in 1 week(s).                 Diet recommendation: Regular diet  Activity: The patient is advised to gradually reintroduce usual activities, as tolerated  Discharge Condition: stable  Code Status: Full code   History of present illness: As per the H and P dictated on admission.  Hospital Course:  48 y.o. male with PMHx of polysubstance abuse (cocaine, methamphetamines, benzos, opiates, ETOH), previous suicide attempts, and HIV, elevated from EMR, patient presented at [facility] ED with chief complaints of gastric upset due to intentional drug overdose of boric acid and unknown amount of trazodone .  admitted with Suicide Attempt by Intentional Drug Overdose (cocaine, amphetamines, boric acid) and Acute Kidney Injury. Course complicated by drug induced hypotension briefly requiring vasopressors so patient was admitted in the ICU.   ED Course: Initial vital signs showed HR of 111 beats/minute, BP 84/42mm Hg, the RR 21 breaths/minute, and the oxygen saturation 100 % on RA and a temperature of 97.72F (36.3C).  Pertinent Labs/Diagnostics Findings: Na+/ K+: 139/3.4.  Glucose: 135 BUN/Cr.32/4.65 :AST/ALT: 69/37, AG 19 WBC: 12.5 K/L  Ethanol less than 15 UDS positive amphetamine and cocaine CT Abd/pel>negative   Poison control was notified by ED provider and they recommended supportive care with no antidote or any specific treatment. Psychiatry evaluated patient in the  ED and patient admitted to [facility] service  Micro data: 9/14: Blood culture x2> no growth to date 9/14: MRSA PCR>> negative  9/14: C.diff negative   Significant Hospital Events:  9/13: Admit to [facility] service with Suicide Attempt by intentional drug overdose in the setting of illicit drug use 9/14: PCCM consulted for persistent hypotension requiring pressor  9/15: Overnight wanted to leave AMA, however nursing witnessed verbalization of suicidal ideation.  Suicide precautions initiated, Psychiatry re-consulted.  Weaned off Levophed  earlier this morning, AKI improving.  Assessment and plan:  # Hypotension: suspect Drug Induced ~ RESOLVED  S/p IV fluids and Levophed  which was gradually weaned off, patient was given midodrine . Lactic acid has normalized.  Blood pressure is stable now, does not need any BP support.  Continue to monitor.   # Suicide Attempt by Intentional Drug Overdose # Polysubstance abuse: Cocaine and amphetamine abuse # ETOH abuse, high risk for development of withdrawals PMHx: Suicide attempts iso illicit drug use, methamphetamine induced psychosis, PTSD,  Anxiety, Depression Poison control was following, appreciated input, recommended if CK level is normal then no more need to follow with poison control. Continued IVC and suicide precautions ~ Re-consult Psychiatry 9/15 S/p CIWA Protocol, Continue Librium  taper -Thiamine , Folate, and MVI  As per psych patient was admitted under behavioral health unit [facility] with involuntary commitment.   # Rhabdomyolysis due to polysubstance abuse. CK 429, trended down with IV fluids.  # Acute Kidney Injury ~resolved # Anion Gap Metabolic Acidosis due to lactic acidosis ~resolved S/p IV fluids and electrolytes management.  Nephrology was consulted,  patient does not needed any hemodialysis.  AKI resolved.   # Leukocytosis # HIV Last (01/2024)Viral load undetectable, CD4 count 533 Per ID notes on cabotegravir  & rilpivirine  ER  ([name] ) 600 & 900 MG/3ML injection PMHx: MRSA Bacteremia/abscesses, Last (01/2024)Viral load undetectable, CD4 count 533  -Monitor fever curve -Trend WBC's & Procalcitonin -Follow cultures as above -Will hold off on empiric ABX for now pending cultures & sensitivities -Consider ID consult for reinitiating of ARRT   # Hypophosphatemia due to nutritional deficiency, phosphorus repleted.  # Vitamin D  deficiency: Recommend to follow with PCP for vitamin D  supplement and further management as an outpatient.     Body mass index is 27.36 kg/m.  Nutrition Interventions:  Patient was ambulatory without any assistance.  On the day of the discharge the patient's vitals were stable, and no other acute medical condition were reported by patient. the patient was felt safe to be discharge to behavioral health unit in [place] with IVC as per psych.   Consultants: PCCM, nephrology, poison control Procedures: None  Discharge Exam: General: Appear in no distress,Oral Mucosa Clear, moist. Cardiovascular: S1 and S2 Present, no Murmur, Respiratory: normal respiratory effort, Bilateral Air entry present and no Crackles, no wheezes Abdomen: Bowel Sound present, Soft and no tenderness.  Extremities: no Pedal edema, no calf tenderness Neurology: alert and oriented to time, place, and person affect appropriate.  Filed Weights   07/06/24 1517 07/06/24 1900  Weight: 109.3 kg 107.4 kg   Vitals:   07/09/24 1100 07/09/24 1135  BP:  (!) 124/99  Pulse: 97   Resp: (!) 28 (!) 26  Temp:    SpO2: 96%     DISCHARGE MEDICATION: Allergies as of 07/09/2024       Reactions   Abacavir Other (See Comments)   HLA B5701 positive - should not receive abacavir due to risk of hypersensitivity        Medication List     STOP taking these medications    fenofibrate  48 MG tablet Commonly known as: Tricor    ibuprofen  600 MG tablet Commonly known as: ADVIL    nicotine  14 mg/24hr patch Commonly  known as: NICODERM CQ  - dosed in mg/24 hours   nicotine  polacrilex 4 MG lozenge Commonly known as: Nicotine  Mini       TAKE these medications    acetaminophen  325 MG tablet Commonly known as: TYLENOL  Take 2 tablets (650 mg total) by mouth every 6 (six) hours as needed for mild pain (pain score 1-3), fever or headache (or Fever >/= 101).   chlordiazePOXIDE  25 MG capsule Commonly known as: LIBRIUM  Take 1 capsule (25 mg total) by mouth 2 (two) times daily in the am and at bedtime. for 1 day, THEN 1 capsule (25 mg total) daily for 1 day. Start taking on: July 09, 2024   fexofenadine -pseudoephedrine 60-120 MG 12 hr tablet Commonly known as: ALLEGRA-D Take 1 tablet by mouth 2 (two) times daily.   folic acid  1 MG tablet Commonly known as: FOLVITE  Take 1 tablet (1 mg total) by mouth daily. Start taking on: July 10, 2024   hydrOXYzine  25 MG tablet Commonly known as: ATARAX  Take 1 tablet (25 mg total) by mouth every 6 (six) hours as needed for anxiety (or CIWA score </= 10).   multivitamin with minerals Tabs tablet Take 1 tablet by mouth daily. Start taking on: July 10, 2024   pantoprazole  40 MG tablet Commonly known as: PROTONIX  Take 1 tablet (40 mg total) by mouth daily for 14 days.  rosuvastatin  20 MG tablet Commonly known as: CRESTOR  Take 1 tablet (20 mg total) by mouth at bedtime.   sertraline  50 MG tablet Commonly known as: Zoloft  Take 1 tablet (50 mg total) by mouth daily.   thiamine  100 MG tablet Commonly known as: Vitamin B-1 Take 1 tablet (100 mg total) by mouth daily. Start taking on: July 10, 2024   traZODone  50 MG tablet Commonly known as: DESYREL  Take 1 tablet (50 mg total) by mouth at bedtime as needed for sleep.       Allergies  Allergen Reactions   Abacavir Other (See Comments)    HLA B5701 positive - should not receive abacavir due to risk of hypersensitivity   Discharge Instructions     Call MD for:  difficulty  breathing, headache or visual disturbances   Complete by: As directed    Call MD for:  extreme fatigue   Complete by: As directed    Call MD for:  persistant dizziness or light-headedness   Complete by: As directed    Call MD for:  persistant nausea and vomiting   Complete by: As directed    Call MD for:  severe uncontrolled pain   Complete by: As directed    Call MD for:  temperature >100.4   Complete by: As directed    Diet general   Complete by: As directed    Discharge instructions   Complete by: As directed    Follow-up with psych and follow-up with PCP in 1 week after discharge   Increase activity slowly   Complete by: As directed        The results of significant diagnostics from this hospitalization (including imaging, microbiology, ancillary and laboratory) are listed below for reference.    Significant Diagnostic Studies: US  EKG SITE RITE Result Date: 07/07/2024 If Site Rite image not attached, placement could not be confirmed due to current cardiac rhythm.  CT ABDOMEN PELVIS WO CONTRAST Result Date: 07/06/2024 EXAM: CT ABDOMEN AND PELVIS WITHOUT CONTRAST 07/06/2024 04:57:10 PM TECHNIQUE: CT of the abdomen and pelvis was performed without the administration of intravenous contrast. Multiplanar reformatted images are provided for review. Automated exposure control, iterative reconstruction, and/or weight-based adjustment of the mA/kV was utilized to reduce the radiation dose to as low as reasonably achievable. COMPARISON: 04/24/2026 and previous. CLINICAL HISTORY: Acute renal failure. N/V/D after boric acid ingestion for SI attempt. Diffuse abd pain. Pt via [facility] from home. Pt took 3 cupfuls of boric acid roach powder sometime yesterday afternoon/evening. Unable to give an exact time. Also states he to 10 50mg  Trazodone  throughout the night. Report he did do this to harm himself. Denies any SI at this time. Pt also uses meth and cocaine, last use was sometimes yesterday.  Denies ETOH. Pt is A\T\Ox4 and NAD. FINDINGS: LOWER CHEST: No acute abnormality. LIVER: The liver is unremarkable. GALLBLADDER AND BILE DUCTS: Gallbladder is unremarkable. No biliary ductal dilatation. SPLEEN: No acute abnormality. PANCREAS: No acute abnormality. ADRENAL GLANDS: No acute abnormality. KIDNEYS, URETERS AND BLADDER: No stones in the kidneys or ureters. No hydronephrosis. No perinephric or periureteral stranding. Urinary bladder is nondistended. GI AND BOWEL: Stomach demonstrates no acute abnormality. There is no bowel obstruction. PERITONEUM AND RETROPERITONEUM: No ascites. No free air. VASCULATURE: Scattered aortoiliac calcified plaque without AAA. LYMPH NODES: No lymphadenopathy. REPRODUCTIVE ORGANS: No acute abnormality. BONES AND SOFT TISSUES: Bilateral hip DJD. Bilateral pelvic phleboliths. No acute osseous abnormality. No focal soft tissue abnormality. IMPRESSION: 1. No acute findings in the abdomen or  pelvis. Electronically signed by: Dayne Hassell MD 07/06/2024 05:01 PM EDT RP Workstation: [id number]    Microbiology: Recent Results (from the past 240 hours)  MRSA Next Gen by PCR, Nasal     Status: None   Collection Time: 07/06/24  7:07 PM   Specimen: Nasal Mucosa; Nasal Swab  Result Value Ref Range Status   MRSA by PCR Next Gen NOT DETECTED NOT DETECTED Final    Comment: (NOTE) The GeneXpert MRSA Assay (FDA approved for NASAL specimens only), is one component of a comprehensive MRSA colonization surveillance program. It is not intended to diagnose MRSA infection nor to guide or monitor treatment for MRSA infections. Test performance is not FDA approved in patients less than 53 years old. Performed at [facility], [address] [place], [place] [address]   Culture, blood (Routine X 2) w Reflex to ID Panel     Status: None (Preliminary result)   Collection Time: 07/07/24  6:50 AM   Specimen: BLOOD  Result Value Ref Range Status   Specimen Description BLOOD  BLOOD LEFT HAND  Final   Special Requests   Final    BOTTLES DRAWN AEROBIC AND ANAEROBIC Blood Culture results may not be optimal due to an inadequate volume of blood received in culture bottles   Culture   Final    NO GROWTH 2 DAYS Performed at [facility], [address], [place], [place] [address]    Report Status PENDING  Incomplete  Culture, blood (Routine X 2) w Reflex to ID Panel     Status: None (Preliminary result)   Collection Time: 07/07/24  6:50 AM   Specimen: BLOOD  Result Value Ref Range Status   Specimen Description BLOOD BLOOD LEFT HAND  Final   Special Requests   Final    BOTTLES DRAWN AEROBIC AND ANAEROBIC Blood Culture results may not be optimal due to an inadequate volume of blood received in culture bottles   Culture   Final    NO GROWTH 2 DAYS Performed at [facility], [address], [place], [place] [address]    Report Status PENDING  Incomplete  C Difficile Quick Screen w PCR reflex     Status: None   Collection Time: 07/07/24 12:52 PM   Specimen: STOOL  Result Value Ref Range Status   C Diff antigen NEGATIVE NEGATIVE Final   C Diff toxin NEGATIVE NEGATIVE Final   C Diff interpretation No C. difficile detected.  Final    Comment: Performed at [facility], [address] [place], [place] [address]     Labs: CBC: Recent Labs  Lab 07/06/24 1520 07/07/24 0401 07/07/24 1049 07/09/24 0448  WBC 12.5* 10.5 8.5 6.2  NEUTROABS 9.5*  --  5.7  --   HGB 14.8 12.7* 11.7* 12.1*  HCT 44.9 37.4* 35.9* 36.4*  MCV 93.3 93.3 96 94.1  PLT 215 183 177 159   Basic Metabolic Panel: Recent Labs  Lab 07/06/24 1520 07/06/24 2216 07/07/24 0400 07/07/24 1320 07/08/24 0431 07/08/24 1559 07/09/24 0611 07/09/24 0842  NA 139   < > 135 138 140 140 138  --   K 3.4*   < > 3.7 3.3* 3.8 4.1 3.5  --   CL 97*   < > 102 104 107 106 107  --   CO2 23   < > 19* 24 24 24 23   --   GLUCOSE 135*   < > 115* 169* 142* 135* 117*  --   BUN  32*   < >  34* 27* 19 16 12   --   CREATININE 4.63*   < > 3.64* 2.09* 1.21 1.16 0.96  --   CALCIUM  9.5   < > 8.2* 7.7* 7.8* 8.4* 9.1  --   MG 2.5*  --   --   --   --   --  1.7  --   PHOS  --   --   --   --   --   --   --  2.1*   < > = values in this interval not displayed.   Liver Function Tests: Recent Labs  Lab 07/06/24 1520 07/07/24 0401  AST 69* 46*  ALT 37 28  ALKPHOS 81 59  BILITOT 0.9 0.9  PROT 7.9 6.1*  ALBUMIN 4.6 3.7   No results for input(s): LIPASE, AMYLASE in the last 168 hours. No results for input(s): AMMONIA in the last 168 hours. Cardiac Enzymes: Recent Labs  Lab 07/07/24 0400 07/08/24 1559 07/09/24 0611  CKTOTAL 1,789* 790* 429*   BNP (last 3 results) No results for input(s): BNP in the last 8760 hours. CBG: Recent Labs  Lab 07/06/24 1901  GLUCAP 135*    Time spent: 35 minutes  Signed:  Elvan Sor  [facility] 07/09/2024 1:00 PM

## 2024-07-09 NOTE — Anesthesia Preprocedure Evaluation (Signed)
 Anesthesia Evaluation  Patient identified by MRN, date of birth, ID band Patient confused    Reviewed: H&P Preop documentation limited or incomplete due to emergent nature of procedure.  Airway Mallampati: IV  TM Distance: >3 FB Neck ROM: Full  Mouth opening: Limited Mouth Opening  Dental  (+) Dental Advisory Given   Pulmonary Current Smoker     unstable     Cardiovascular negative cardio ROS  Rhythm:Regular Rate:Tachycardia     Neuro/Psych  PSYCHIATRIC DISORDERS  Depression    Recent suicide attempt with boric acid ingestion, admitted to hospital and then transferred to behavioral health which he left from earlier today. Immediately after leaving began experiencing severe airway swelling, SOB, salivation. negative neurological ROS     GI/Hepatic negative GI ROS, Neg liver ROS,,,  Endo/Other  negative endocrine ROS    Renal/GU negative Renal ROS  negative genitourinary   Musculoskeletal negative musculoskeletal ROS (+)    Abdominal   Peds negative pediatric ROS (+)  Hematology  (+) HIV  Anesthesia Other Findings   Reproductive/Obstetrics negative OB ROS                              Anesthesia Physical Anesthesia Plan  ASA: 4 and emergent  Anesthesia Plan: General   Post-op Pain Management:    Induction:   PONV Risk Score and Plan:   Airway Management Planned: Awake Intubation Planned, Nasal ETT and Tracheostomy  Additional Equipment:   Intra-op Plan:   Post-operative Plan: Post-operative intubation/ventilation  Informed Consent:      Only emergency history available  Plan Discussed with: CRNA and Surgeon  Anesthesia Plan Comments: (Called by ED to join ENT (Dr. Luciano) in attempting intubation of this patient with extreme airway swelling after boric acid ingestion. Pt sitting up, tripodding at a RR Of 40 with decreased mentation and copious thick secretions from mouth  and nares. Tongue is completely protruded from mouth, no view of airway whatsoever. Discussed extensively with multiple providers at bedside in ED, plan to take patient emergently to OR for attempt at awake/sedated (depending on patient tolerance) nasal fiberoptic intubation. Neck will be localized prior to any attempts in case of need for emergent cricothyroidotomy. )        Anesthesia Quick Evaluation

## 2024-07-09 NOTE — Sepsis Progress Note (Signed)
 Elink monitoring for the code sepsis protocol.

## 2024-07-09 NOTE — Consult Note (Signed)
 ENT CONSULT:  Reason for Consult: Angioedema   Referring Physician:  Medford Flake MD  HPI: Gabriel [name] is an 48 y.o. male with a history of HIV, polysubstance abuse recently admitted 07/06/24 - 07/09/24 for drug and boric acid overdose. ENT/GI not previously consulted. He returned to the ED restless, grimacing and with tongue swelling. ED concern for trauma from caustic ingestion and angioedema. Requesting ENT airway support.  Upon arrival to ED patient resting in bed in moderate distress. Unable to interview due to severe angioedema.    Past Medical History:  Diagnosis Date   Abscess    buttocks   Cellulitis    HIV positive (HCC)    MRSA infection     Past Surgical History:  Procedure Laterality Date   HAND / FINGER LESION EXCISION      Family History  Problem Relation Age of Onset   Hypertension Mother    Cancer Maternal Grandmother    Crohn's disease Brother     Social History:  reports that he has been smoking cigarettes. He has never used smokeless tobacco. He reports current alcohol use of about 3.0 standard drinks of alcohol per week. He reports that he does not currently use drugs after having used the following drugs: IV, Methamphetamines, and Cocaine.  Allergies:  Allergies  Allergen Reactions   Abacavir Other (See Comments)    HLA B5701 positive - should not receive abacavir due to risk of hypersensitivity    Medications: I have reviewed the patient's current medications.  Results for orders placed or performed during the hospital encounter of 07/09/24 (from the past 48 hours)  Comprehensive metabolic panel     Status: Abnormal   Collection Time: 07/09/24  8:24 PM  Result Value Ref Range   Sodium 138 135 - 145 mmol/L   Potassium 4.1 3.5 - 5.1 mmol/L   Chloride 102 98 - 111 mmol/L   CO2 19 (L) 22 - 32 mmol/L   Glucose, Bld 133 (H) 70 - 99 mg/dL    Comment: Glucose reference range applies only to samples taken after fasting for at least 8 hours.    BUN 11 6 - 20 mg/dL   Creatinine, Ser 8.98 0.61 - 1.24 mg/dL   Calcium  10.1 8.9 - 10.3 mg/dL   Total Protein 7.9 6.5 - 8.1 g/dL   Albumin 4.4 3.5 - 5.0 g/dL   AST 26 15 - 41 U/L   ALT 35 0 - 44 U/L   Alkaline Phosphatase 102 38 - 126 U/L   Total Bilirubin 1.4 (H) 0.0 - 1.2 mg/dL   GFR, Estimated >39 >39 mL/min    Comment: (NOTE) Calculated using the CKD-EPI Creatinine Equation (2021)    Anion gap 18 (H) 5 - 15    Comment: Performed at [facility], [address], [place], [place] [address]  CBC with Differential     Status: Abnormal   Collection Time: 07/09/24  8:24 PM  Result Value Ref Range   WBC 8.3 4.0 - 10.5 K/uL   RBC 4.29 4.22 - 5.81 MIL/uL   Hemoglobin 13.3 13.0 - 17.0 g/dL   HCT 59.4 60.9 - 47.9 %   MCV 94.4 80.0 - 100.0 fL   MCH 31.0 26.0 - 34.0 pg   MCHC 32.8 30.0 - 36.0 g/dL   RDW 85.4 88.4 - 84.4 %   Platelets 165 150 - 400 K/uL   nRBC 0.0 0.0 - 0.2 %   Neutrophils Relative % 92 %   Neutro  Abs 7.6 1.7 - 7.7 K/uL   Lymphocytes Relative 6 %   Lymphs Abs 0.5 (L) 0.7 - 4.0 K/uL   Monocytes Relative 2 %   Monocytes Absolute 0.1 0.1 - 1.0 K/uL   Eosinophils Relative 0 %   Eosinophils Absolute 0.0 0.0 - 0.5 K/uL   Basophils Relative 0 %   Basophils Absolute 0.0 0.0 - 0.1 K/uL   Immature Granulocytes 0 %   Abs Immature Granulocytes 0.03 0.00 - 0.07 K/uL    Comment: Performed at [facility], [address] [place], [place] [address]    DG Chest Port 1 View Result Date: 07/09/2024 CLINICAL DATA:  Possible sepsis and angioedema EXAM: PORTABLE CHEST 1 VIEW COMPARISON:  12/03/2023 FINDINGS: Cardiac shadow is within normal limits. The lungs are well aerated bilaterally. Some patchy airspace opacity is noted in the left base which may represent early infiltrate. No bony abnormality is seen. IMPRESSION: Patchy left basilar airspace opacity. Electronically Signed   By: Oneil Devonshire M.D.   On: 07/09/2024 21:17    MND:wzhjupcz other  than stated per HPI  Blood pressure (!) 141/92, pulse (!) 124, temperature (!) 102.8 F (39.3 C), temperature source Oral, resp. rate (!) 29, height 6' 6 (1.981 m), weight 101.6 kg, SpO2 100%.  PHYSICAL EXAM: Intermittently cooperative adult male agitated, and unable to talk. Severe tongue angioedema. Nasal cavity significant crusting neck soft and supple with good airway landmarks; palpable cricothyroid membrane.   Attempted bedside flexible laryngoscopy - patient did not tolerate past nasal endoscopy.  Studies Reviewed: CXR 07/09/24.   COMPARISON:  12/03/2023   FINDINGS: Cardiac shadow is within normal limits. The lungs are well aerated bilaterally. Some patchy airspace opacity is noted in the left base which may represent early infiltrate. No bony abnormality is seen.   IMPRESSION: Patchy left basilar airspace opacity.     Electronically Signed   By: Oneil Devonshire M.D.   On: 07/09/2024 21:17  Assessment/Plan: 48 year old male with recent drug and boric acid overdose 3 days ago discharged earlier today presenting to the ED with severe angioedema.   Case discussed with ED, Critical Care, and anesthesia ; recommend emergent transfer to OR for nasotracheal intubation under anesthesia, possible cricothyrotomy / tracheostomy.   Medical Decision Making: 4 - High  #/Compl Problems  4                         Data Rev  4                 Management 4             (1-Straightforward, 2-Low, 3- Moderate, 4-High)   Electronically signed by:  Elspeth Coddington, MD  Staff Physician Facial Plastic & Reconstructive Surgery Otolaryngology - Head and Neck Surgery [facility]   07/09/2024, 9:46 PM

## 2024-07-09 NOTE — Plan of Care (Signed)
  Problem: Clinical Measurements: Goal: Ability to maintain clinical measurements within normal limits will improve Outcome: Progressing Goal: Will remain free from infection Outcome: Progressing Goal: Diagnostic test results will improve Outcome: Progressing Goal: Respiratory complications will improve Outcome: Progressing Goal: Cardiovascular complication will be avoided Outcome: Progressing   Problem: Pain Managment: Goal: General experience of comfort will improve and/or be controlled Outcome: Progressing   Problem: Skin Integrity: Goal: Risk for impaired skin integrity will decrease Outcome: Progressing

## 2024-07-09 NOTE — Anesthesia Postprocedure Evaluation (Signed)
 Anesthesia Post Note  Patient: Gabriel [name]  Procedure(s) Performed: NASAL INTUBATION     Patient location during evaluation: Other Anesthesia Type: General Level of consciousness: patient remains intubated per anesthesia plan Vital Signs Assessment: post-procedure vital signs reviewed and stable Respiratory status: patient on ventilator - see flowsheet for VS and patient remains intubated per anesthesia plan Cardiovascular status: stable Anesthetic complications: no Comments: Returned to ED on precedex /propofol  gtt for sedation, intubated nasally and tube sutured in. Plan for eventual transfer to [facility] for management of airway burns.     No notable events documented.  Last Vitals:  Vitals:   07/09/24 2251 07/09/24 2300  BP:  (!) 84/50  Pulse:  (!) 115  Resp:  (!) 24  Temp:    SpO2: 99% 100%                  Almarie CHRISTELLA Marchi
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
## 2024-07-09 NOTE — Transfer of Care (Signed)
 Immediate Anesthesia Transfer of Care Note  Patient: Gabriel [name]  Procedure(s) Performed: NASAL INTUBATION  Patient Location: ED   Anesthesia Type:General  Level of Consciousness: sedated and Patient remains intubated per anesthesia plan  Airway & Oxygen Therapy: Patient remains intubated per anesthesia plan  Post-op Assessment: Report given to RN and Post -op Vital signs reviewed and stable  Post vital signs: Reviewed and stable  Last Vitals:  Vitals Value Taken Time  BP 123/79 07/09/24 22:50  Temp    Pulse 119 07/09/24 22:53  Resp 24 07/09/24 22:53  SpO2 98 % 07/09/24 22:53  Vitals shown include unfiled device data.     Patients Stated Pain Goal: 0 (07/09/24 1900)  Complications: No notable events documented.

## 2024-07-09 NOTE — Sepsis Progress Note (Addendum)
 Notified bedside nurse of need to draw lactic acid.  Per RN, patient had emergency & rushed to OR before lab could be collected.
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
## 2024-07-09 NOTE — ED Notes (Signed)
 Updated vital signs

## 2024-07-09 NOTE — ED Triage Notes (Signed)
 Pt comes from [facility] w/ c/o angioedema. Arrived at [facility] around 6pm and they called EMS around 8pm due to tongue swelling. Pt took 3 cups of boric acid roach powder yesterday but denies any BP meds.

## 2024-07-10 ENCOUNTER — Encounter (HOSPITAL_COMMUNITY): Payer: Self-pay | Admitting: Otolaryngology

## 2024-07-10 LAB — URINALYSIS, W/ REFLEX TO CULTURE (INFECTION SUSPECTED)
Bacteria, UA: NONE SEEN
Bilirubin Urine: NEGATIVE
Glucose, UA: 50 mg/dL — AB
Ketones, ur: 20 mg/dL — AB
Nitrite: NEGATIVE
Protein, ur: 30 mg/dL — AB
Specific Gravity, Urine: 1.014 (ref 1.005–1.030)
pH: 5 (ref 5.0–8.0)

## 2024-07-10 LAB — BLOOD CULTURE ID PANEL (REFLEXED) - BCID2

## 2024-07-10 LAB — NOROVIRUS GROUP 1 & 2 BY PCR, STOOL
Norovirus 1 by PCR: NEGATIVE
Norovirus 2  by PCR: NEGATIVE

## 2024-07-10 LAB — BLOOD GAS, ARTERIAL
Acid-base deficit: 4.1 mmol/L — ABNORMAL HIGH (ref 0.0–2.0)
Bicarbonate: 20 mmol/L (ref 20.0–28.0)
Drawn by: 42246
FIO2: 100 %
MECHVT: 630 mL
O2 Saturation: 99.1 %
PEEP: 5 cmH2O
Patient temperature: 37.2
RATE: 18 {breaths}/min
pCO2 arterial: 33 mmHg (ref 32–48)
pH, Arterial: 7.39 (ref 7.35–7.45)
pO2, Arterial: 327 mmHg — ABNORMAL HIGH (ref 83–108)

## 2024-07-10 NOTE — Procedures (Signed)
-------------------------------------------------------------------------------   Attestation signed by [name], MD at 07/10/2024  3:01 PM I provided general supervision for this procedure.  [name]  Wed 07/10/2024 3:01 PM   -------------------------------------------------------------------------------  Procedure: Direct Bedside Video Laryngoscopy  General  Date/Time: 07/10/2024 10:36 AM  Performed by: Johana Maryelizabeth Cable, MD Authorized by: [name], MD     Preoperative Diagnosis: Airway swelling following toxic ingestion Postoperative Diagnosis: Same Physician: Johana Maryelizabeth Cable, MD; Worth Blush, MD Complications: None.   Description of Procedure:  After confirming patient identity, the patient was positioned appropriately in the supine position.  Sedation was increased as deemed appropriate by the primary team. The GlideScope blade (Mac Size 3) was inserted via the oral cavity and advanced into the oropharynx. Examination was carried out of the oropharynx, hypopharynx, and larynx with findings as noted below.  The GlideScope was carefully removed. The patient tolerated the procedure well.   Findings: The tongue base, pharyngeal walls, piriform sinuses, vallecula, epiglottis and postcricoid region are normal in appearance aside from: prominent tongue swelling with copious exudative secretions. Otherwise, normal airway landmarks with no swelling. No obvious pharyngeal injuries to posterior lateral pharyngeal walls, tongue base, or vallecula. The vocal cords appear normal without swelling or lesions. The visualized portion of the subglottis and proximal trachea has limited view secondary to ETT placement.  Recommendations: Maintain intubation status for now. ENT will reassess tomorrow morning. Please perform SBT tomorrow morning. If tongue swelling is stable to improved, would be reasonable to consider extubation tomorrow 9/18.            Airway plan: Should  patient become extubated and require reintubation, recommend oral intubation with GlideScope Mac Size 3. Straightforward exposure.   Electronically signed by: Johana Maryelizabeth Cable, MD 07/10/2024 10:43 AM
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
## 2024-07-10 NOTE — Consults (Signed)
 ------------------------------------------------------------------------------- Attestation signed by Gabriel JONETTA Sample, MD at 07/10/2024  3:05 PM I reviewed the patient's status and agree with the plan of care as documented by the resident.   Gabriel [name]  Wed 07/10/2024 3:05 PM   -------------------------------------------------------------------------------  Otolaryngology Consultation Note  Referring Physician: Dr. Marsa Cory [name]* Primary Care Physician: No primary care provider on file. Patient Location at Initial Consult: Inpatient Service: MICU Chief Complaint/Reason for Consult: Airway evaluation following toxic ingestion  History of Presenting Illness:  History obtained from Electronic medical record / chart review.  Gabriel [name] is a 48 y.o. male with a medical history significant for HIV, history of cocaine and amphetamine use, alcohol use disorder, and depression with multiple suicide attempts, with recent admission to outside hospital from 9/13-9/16 for intentional drug overdose of boric acid and multiple pills of trazodone , now admitted to MICU for delayed oropharyngeal swelling requiring emergent intubation in route to IVC. ENT was consulted for airway evaluation.   Per outside hospital record and primary team, patient was hospitalized at OSH from 9/13- 9/16 for intentional drug overdose of boric acid and multiple pills of trazodone . He was monitored for 3 days, and after deemed medically stable, was discharged under IVC to go to [facility]. Reportedly, patient had no airway symptoms, swelling, or rashes prior to IVC transport.  In route to inpatient psychiatric facility, the patient developed severe oropharyngeal swelling requiring an emergent stop. Per medical record, he had rapidly worsening swelling of the tongue, peeling of the face, and desquamating tissue in the mouth. He was emergently transported to the closest OSH [facility]), and brought to the  OR, where he underwent nasopharyngeal intubation with the anesthesia team and ENT team on standyby. Per their intubation note, he had copious thick secretions, massive airway edema, and excoriated orophayngeal mucosa. He also received decadron , racemic epi, anti-histamines, and IM epinephrine . He was then transferred to the [facility] MICU for further evaluation and management.   Upon MICU arrival, he was intubated and sedated. Vent settings included an FiO2 of 40% and a PEEP of 5. Labs were notable for a leukocytosis to 17.3. He was started on broad spectrum antibiotics. A CT chest/abdomen/pelvis and a CT neck were ordered. Preliminary CT chest read with thickening of the distal esophagus as can be seen with esophagitis, but no pneumomediastinum or mediastinal free fluid.  Primary team notes plans to consult toxicology and burn team in addition to ENT. GI evaluation was deferred at this time secondary to tongue swelling.  History otherwise limited secondary to sedation/intubation status.   Review of Systems: As noted in HPI.   Past Medical History: As noted in HPI.   Past Surgical History: As noted in HPI.   Family History: No history of bleeding disorders or difficulty with anesthesia.  Social History: Unable to obtain secondary to intubation/sedation status.  Medical History[1] Surgical History[2] Family History[3] Social History[4] Medications Ordered Prior to Encounter[5] Allergies[6]   OBJECTIVE: Vitals and labs reviewed.   Physical Exam General: -Intubated and sedated.   Head/Face: -Facial movement grossly symmetric with grimacing.  -Desquamating rash all over face, including tongue and lips.     Eyes: -PERRL -Not able to assess EOM   Ears: -Normal auricles bilaterally   Nose: -ETT in place in right nare. No gross deformity, purulent discharge, epistaxis, or rhinorrhea.   Mouth/Oropharynx: -Lips mildly edematous -Tongue with moderate to severe swelling. Able to see  component of posterior oropharynx with tongue depressor. Uvula is midline without  significant swelling. View is otherwise limited secondary to tongue swelling. -Floor of mouth soft.   Neck: -Trachea midline. Palpable landmarks, no pulsatility at sternal notch.  -No crepitus or masses -C-collar in place.   Respiratory: -Saturating appropriately on mechanical ventilation Vent Mode: Pressure regulated volume control/assist control FiO2 Set (%):  [40 %] 40 % S RR:  [18] 18 S VT:  [550 mL] 550 mL PEEP/P Low (set, cm H2O):  [5 cm H20] 5 cm H20    Cardiovascular: -Hemodynamically stable   Neurologic: -Assessment limited secondary to sedation status.   Skin: No urticaria or rashes not involving the face   Review of Ancillary Data / Diagnostic Tests: I have personally reviewed the following imaging: -CT neck: No acute findings. No significant airway edema appreciated, though interpretation of this is limited on a CT scan. No obvious pneumomediastinum.    ASSESSMENT: Gabriel [name] is a 48 y.o. male with a medical history significant for HIV, history of cocaine and amphetamine use, alcohol use disorder, and depression with multiple suicide attempts, with recent admission to outside hospital from 9/13-9/16 for intentional drug overdose of boric acid and multiple pills of trazodone , now admitted to MICU for delayed oropharyngeal swelling requiring emergent intubation in route to IVC. ENT was consulted for airway evaluation. Physical exam is notable for swelling of the lips and tongue, with a desquamating rash involving the facial skin, lips, and tongue. He is currently stable on mechanical ventilation. Plan to perform airway assessment via direct laryngoscopy at bedside, though anticipate he will need to stay intubated today to allow swelling to improve.  RECOMMENDATIONS: ENT to perform video direct laryngoscopy at bedside in MICU to further evaluate airway swelling. Please clear c-collar if  able. Continue Decadron  10mg  q8hr for 24hr total. Given reaction seeming to be multiple days delayed from ingestion, would consider new ingestion in transport to behavioral health facility. Remainder of care per primary team/toxicology/GI. ENT will continue to follow. Please page ENT if patient develops airway concerns.  Patient seen with Worth Blush, MD, PGY4 and discussed with Dr. Mansfield, attending on call  Thank you for including us  in the care of this patient.  For questions or concerns, please secure chat message the appropriate ENT Team listed under Care Providers. Please make sure to leave a callback number.  Electronically signed by: Johana Maryelizabeth Cable, MD 07/10/2024 8:58 AM         [1] No past medical history on file. [2] No past surgical history on file. [3] No family history on file. [4] Social History Socioeconomic History  . Marital status: Unknown  Tobacco Use  . Smoking status: Every Day  . Smokeless tobacco: Never  Substance and Sexual Activity  . Alcohol use: Yes  . Drug use: Never   Social Drivers of Health   Food Insecurity: No Food Insecurity (07/06/2024)   Received from [facility]   Food vital sign   . Within the past 12 months, you worried that your food would run out before you got money to buy more: Never true   . Within the past 12 months, the food you bought just didn't last and you didn't have money to get more: Never true  Transportation Needs: No Transportation Needs (07/06/2024)   Received from [facility] - Transportation   . In the past 12 months, has lack of transportation kept you from medical appointments or from getting medications?: No   . In the past 12 months, has lack of  transportation kept you from meetings, work, or from getting things needed for daily living?: No  Safety: Not At Risk (07/06/2024)   Received from [facility]   Safety   . Within the last year, have you been afraid of your partner or ex-partner?: No   .  Within the last year, have you been humiliated or emotionally abused in other ways by your partner or ex-partner?: No   . Within the last year, have you been kicked, hit, slapped, or otherwise physically hurt by your partner or ex-partner?: No   . Within the last year, have you been raped or forced to have any kind of sexual activity by your partner or ex-partner?: No  Living Situation: Low Risk  (07/06/2024)   Received from [facility] Situation   . In the last 12 months, was there a time when you were not able to pay the mortgage or rent on time?: No   . In the past 12 months, how many times have you moved where you were living?: 0   . At any time in the past 12 months, were you homeless or living in a shelter (including now)?: No  [5] Current Facility-Administered Medications on File Prior to Encounter  Medication Dose Route Frequency Provider Last Rate Last Admin  . [DISCONTINUED] diazePAM  (VALIUM ) injection  5 mg intravenous  Generic External Data Provider      . [DISCONTINUED] enoxaparin  (LOVENOX ) syringe  40 mg subcutaneous  Generic External Data Provider      . [DISCONTINUED] GENERIC EXTERNAL MEDICATION     Generic External Data Provider      . [DISCONTINUED] GENERIC EXTERNAL MEDICATION     Generic External Data Provider      . [DISCONTINUED] GENERIC EXTERNAL MEDICATION     Generic External Data Provider      . [DISCONTINUED] GENERIC EXTERNAL MEDICATION  100 mg oral  Generic External Data Provider      . [DISCONTINUED] GENERIC EXTERNAL MEDICATION     Generic External Data Provider      . [DISCONTINUED] GENERIC EXTERNAL MEDICATION     Generic External Data Provider      . [DISCONTINUED] GENERIC EXTERNAL MEDICATION     Generic External Data Provider      . [DISCONTINUED] GENERIC EXTERNAL MEDICATION     Generic External Data Provider      . [DISCONTINUED] GENERIC EXTERNAL MEDICATION     Generic External Data Provider      . [DISCONTINUED] GENERIC EXTERNAL MEDICATION     Generic  External Data Provider      . [DISCONTINUED] GENERIC EXTERNAL MEDICATION     Generic External Data Provider      . [DISCONTINUED] GENERIC EXTERNAL MEDICATION     Generic External Data Provider      . [DISCONTINUED] GENERIC EXTERNAL MEDICATION     Generic External Data Provider      . [DISCONTINUED] GENERIC EXTERNAL MEDICATION     Generic External Data Provider      . [DISCONTINUED] GENERIC EXTERNAL MEDICATION     Generic External Data Provider      . [DISCONTINUED] GENERIC EXTERNAL MEDICATION     Generic External Data Provider      . [DISCONTINUED] GENERIC EXTERNAL MEDICATION     Generic External Data Provider      . [DISCONTINUED] GENERIC EXTERNAL MEDICATION     Generic External Data Provider      . [DISCONTINUED] GENERIC EXTERNAL MEDICATION     Generic External  Data Provider      . [DISCONTINUED] lactated ringer 's infusion   intravenous  Generic External Data Provider      . [DISCONTINUED] midodrine  (PROAMATINE ) tablet  5 mg oral  Generic External Data Provider      . [DISCONTINUED] pantoprazole  (PROTONIX ) EC tablet  40 mg oral  Generic External Data Provider      . [DISCONTINUED] potassium phosphate -sodium phosphate  (K-PHOS NEUTRAL) 250 mg tablet  500 mg oral  Generic External Data Provider       Current Outpatient Medications on File Prior to Encounter  Medication Sig Dispense Refill  . bictegrav-emtricit-tenofov ala ([name]) 50-200-25 mg tab per tablet Take 1 tablet by mouth Once Daily.    . cholecalciferol (VITAMIN D3) 1,250 mcg (50,000 unit) capsule Take  by mouth once a week.    . Lidocaine  Viscous 2 % viscous solution Apply as needed to anal canal 15 mL 1  . rosuvastatin  (CRESTOR ) 20 mg tablet     [6] Allergies Allergen Reactions  . Abacavir Other (See Comments)    HLA [id number] positive - should not receive abacavir due to risk of hypersensitivity

## 2024-07-10 NOTE — H&P (Signed)
 ------------------------------------------------------------------------------- Attestation signed by Marsa Keven Sidle, MD at 07/10/2024  2:45 PM Attending Attestation  I have reviewed and discussed the patient with the resident. I agree with the documentation of the note. Please see my separate note for my full attestation.  Keven Sidle MD PhD 07/10/2024  2:45 PM Medical ICU Attending  -------------------------------------------------------------------------------  MICU B HISTORY AND PHYSICAL  Attending: Marsa Keven Nesmith* LOS: 0 Code Status: Full Code  CC: Toxic ingestion HPI: Gabriel [name] is a 48 y.o.  male with PMHx sig for HIV, Depression with multiple suicide attempts, hx of cocaine and amphetamine use, and alcohol use. He was hospitalized at OSH from 9/13- 9/16 for intentional drug overdose of boric acid and multiple pills of trazodone . While there he also had shock wherein he was hypotensive and required levophed , and was started on midodrine . He also had an AKI and AGMA that resolved. CT Abdomien/pelvis was unremarkable.  He was discharged from the hospital on 9/16 after he was medically stable, and was under IVC to go to [facility].   Of note, records indicate that he was discharged with normal mucous membranes, and no skin rash on the face. He was transported via sherrif to behavioral health facility.   Shortly after arrival he developed respiratory distress (rr 44, tripoding, with thick secretions from nose and mouth). He had rapidly worsening swelling of the tongue, peeling of the face, and desquamating tissue in the mouth. He was emergently brought to the OR and underwent nasopharyngeal intubation with the anesthesia team and ENT team on standyby. Per their intubation note, he had copious thick secretions, massive pharyngeal edema, and excoriated orophayngeal mucosa. He also received decadron , racemic epi, anti-histamines, and IM epinephrine .     REVIEW OF OSH RECORDS:  Labs: Cr 1.01, BUN 11, WBC 8.3, hgb 13.3, plt 165  Vitals: temp 100.65F, HR 82, BP 104/70, Spo2 100%  Imaging: CXR with patchy left basical airspace opacity. Postnasal intubation x-ray with Endotracheal tube with tip terminating tip approximately 8 cm above the carina. The heart and mediastinal contours are within normal limits. No focal consolidation. No pulmonary edema. No pleural effusion. No pneumothorax. No acute osseous abnormality.   Intervention: nasophayngeal intubation, levophed   Medical History[1]  Surgical History[2]  Prescriptions Prior to Admission[3]  Of note, NO ACE-I on patient's home med list from OSH Allergies[4]  Social History   Tobacco Use  . Smoking status: Every Day  . Smokeless tobacco: Never  Substance Use Topics  . Alcohol use: Yes    Family History[5]   Review of Systems - Pertinent positives/negatives discussed in HPI. Remainder of 10system ROS negative. ________________________________________________________ Objective  Vital signs in last 24 hours: Intake/Output:  Temp:  [98.2 F (36.8 C)-98.7 F (37.1 C)] 98.2 F (36.8 C) Heart Rate:  [70-71] 70 Resp:  [19] 19 BP: (101-102)/(75) 102/75 SpO2: 99 % O2 Device: Mechanical ventilation    Wt Readings from Last 1 Encounters:  07/10/24 108 kg (237 lb 11.2 oz)    No intake/output data recorded.   Ventilator Settings: Vent Mode: Pressure regulated volume control/assist control FiO2 Set (%):  [40 %] 40 % S RR:  [18] 18 S VT:  [550 mL] 550 mL PEEP/P Low (set, cm H2O):  [5 cm H20] 5 cm H20Arrived in PRVC FiO2: 40% RR: 18 PEEP: 5  TV: 550  Lines: PIV x3, nasotracheal tube in place  Physical Exam: Gen: Not alert, intubated and sedated HENT: Large areas of flaky and peeling skin on the  forehead and cheeks. Very large swollen tongue CV: RRR. No murmurs, gallops, rubs appreciated Lungs:Ventilated breath sounds, otherwise clear Abdomen. Soft, NTND. BS normoactive   Extremities: Radial, DP, PT 2+. No LE pain, no c/c/e. Skin: See above for face depiction. Small area of flaking on L arm. Otherwise no skin rashes  Labs: No labs available for review   Results from last 7 days  Lab Units 07/10/24 0445  WHITE BLOOD CELL COUNT 10*3/uL 17.30*  HEMOGLOBIN g/dL 9.0*  HEMATOCRIT % 73.8*  PLATELET COUNT 10*3/uL 149*      Results from last 7 days  Lab Units 07/10/24 0445  SODIUM mmol/L 139  POTASSIUM mmol/L 4.9  CHLORIDE mmol/L 108*  CO2 mmol/L 22  BUN mg/dL 12  CREATININE mg/dL 9.05  9.05  CALCIUM  mg/dL 8.7  MAGNESIUM mg/dL 1.4*  PHOSPHORUS mg/dL 6.0*       Invalid input(s): HBA1C Results from last 7 days  Lab Units 07/10/24 0445  BILIRUBIN TOTAL mg/dL 0.9  AST U/L 15  ALT U/L 22  TOTAL PROTEIN g/dL 6.2*  ALBUMIN g/dL 3.9        Invalid input(s): TROPONINI Lab Results  Component Value Date   CKTOTAL 227 (H) 07/10/2024    No results for input(s): FIO2, PEEP, PCO2, PO2, HCO3, TCO2, VOLUME in the last 72 hours.  Invalid input(s): LPMO2, PH1, BEBLOOD, O2SATA, MODE, RRATE, DRAWSITE     Pertinent Radiological findings:  No orders to display  No imaging completed avaliable for review ________________________________________________________ Assessment/Plan:  07/10/2024  Gabriel [name] is a 48 y.o. male with PMHx sig for depression with multiple suicide attempts, HIV, cocaine and methamphetamine use, and alcohol abuse who presented to OSH after intentional overdose on boric acid and trazodone . He was discharged, and then subsequently on the same day required emergent nasopharyngeal intubation for severe respiratory compromise. Admitted to MICU for management of caustic ingestion and airway edema.    #Caustic Ingestion of Boric Acid #Respiratory compromise due to swollen tongue s/p intubation -- Patient with intentional overdose of  3 solo cups of boric acid and multiple pills of trazodone . Was  hospitalized for 3 days, medically cleared for admission to psych unit -- Upon arrival to psych unit the patient experienced profound respiratory compromise, was transferred to the ED and emergently intubated with naso-pharyngeal airway -- Unclear what precipitated this acute airway decompensation, although intubation note did mention severe swelling of the airway and copious secretions -- Required levophed  briefly after intubation, however arrived without need for pressors -- Posion control was contacted at OSH, and called into speak with our nursing staff. They recommended abx, toxicology consult, and repeat EKG PLAN: -- Maintain naso-pharyngeal intubation tube until airway edema is reduced  -- Sedation plan: propofol  and fentanyl   -- ENT consult in the AM -- GI consult in AM for possible endoscopic evaluation  -- IV PPI BID, maintain NPO status -- Consult toxicology  -- based on poison control recs, will empirically start ABX -- CT C/A/P, CT Soft Tissue Neck W Contrast, XR Chest   #Depression with multiple suicide attempts - Was under IVC to go to in-patient behavioral health facilitiy iso his suicide attempt - Will require psych consult once stabilized Plan: - After medically stabilized call psych  #Shock, resolved -- En-route to this hospital the patient was hypotensive, and briefly required levophed  support -- He arrived normotensive with BP maintained off of pressor support -- Possible component of distributive shock given the swollen oral mucosa (possible allergenic response). No clear  source for infection, however septic shock remains on differential especially given the injury to his skin and oral/GI mucous.  -- Broad infectious workup is warranted -- If he becomes hypotensive again, or is not improving, consider Type 2 allergic reaction PLAN: -- Blood cultures x2 -- CXR, imaging as above   #HIV - Per chart review: Last (01/2024)Viral load undetectable, CD4 count 533 Per ID  notes on cabotegravir  & rilpivirine  ER ([name] ) 600 & 900 MG/3ML injection PMHx: MRSA Bacteremia/abscesses, Last (01/2024)Viral load undetectable, CD4 count 533  Plan: - Hold medicines while NPO - ID consult for HIV management   #Feeding/Glucose Control/FEN: NPO #Analgesia/Sedation: prop/entanyl #Indwelling lines: piv x3 #Thromboembolic ppx: lovenox   #HOB>30 (if ETT) #Ulcer ppx: iv ppi bid #Bowel: none while npo #Device removal: no #Antibiotic De-escalation: no #Mobility: stay in bed while intubated #Code:presumed full code, need to contact family #Family Updates: no family available at bedside, no contact in chart #Fluid Status Goal: + to net even #Home Meds Pending Restart: HIV meds  Electronically signed by:  Aleck Duwaine Ned, MD 07/10/2024 1:38 AM        [1] No past medical history on file. [2] No past surgical history on file. [3] Medications Prior to Admission  Medication Sig Dispense Refill Last Dose/Taking  . bictegrav-emtricit-tenofov ala ([name]) 50-200-25 mg tab per tablet Take 1 tablet by mouth Once Daily.     . cholecalciferol (VITAMIN D3) 1,250 mcg (50,000 unit) capsule Take  by mouth once a week.     . Lidocaine  Viscous 2 % viscous solution Apply as needed to anal canal 15 mL 1   . rosuvastatin  (CRESTOR ) 20 mg tablet      [4] Allergies Allergen Reactions  . Abacavir Other (See Comments)    HLA B5701 positive - should not receive abacavir due to risk of hypersensitivity  [5] No family history on file.

## 2024-07-10 NOTE — Progress Notes (Signed)
 MICU Attending Note  Wed 07/10/2024  Gabriel [name]  48 y.o.  [id number]/A   I have seen and examined the patient with the resident/fellow and team.  We discussed the plan and I agree. Please see resident/fellow note for full details.    Briefly this is a 48 y.o.male who presented after ingestion of boric acid several days ago who after recent hospital discharge developed severe tongue swelling requiring intubation. He otherwise is hemodynamically stable. Fortunately, CT scan and ENT exam do not reveal any concerning involvement of the airway outside of the mouth or the GI tract. We are giving steroids to help with the swelling. If it improves, we will attempt extubation as soon as tomorrow. We have consulted ENT, burn/plastics, ophtho, and tox to aid with our care  ICU problems: # Toxic ingestion # Acute hypoxemic respiratory failure  I have personally completed the following: reviewed the pertinent medical records including overnight events if applicable, discussed the patient with multidisciplinary critical care team, reviewed the laboratory and microbiology data, reviewed the pertinent imaging/radiographic results, reviewed pertinent medications, discussed the patient with the bedside nurse, and discussed respiratory management with the respiratory therapist.   This patient is critically ill with the following life-threatening issues requiring my independent presence at the bedside:    oxygenation/ventilation instability requiring invasive mechanical ventilation  cardiac or rhythm disturbances requiring evaluation and/or interventions   Additionally, I have provided: communication with the patient and/or family as available to provide information, discussion of plans for initiating the discharge or post-hospital care, assessment and management of pain with intravenous or oral analgesics, optimization of pulmonary toilet and overall respiratory function, management of nutritional deficiencies, and  evaluation of and correction of electrolyte abnormalities as pertinent.  I personally spent 60 minutes on same day face-to-face evaluation, mangement, same-day documention and care coordination of this patient.  This patient is critically ill requiring ICU level care and at risk for acute decompensation.  This noted time is independent of any procedures completed today.   ----- Keven Sidle MD PhD MICU Attending Pulmonary and Critical Care Medicine

## 2024-07-10 NOTE — Consults (Signed)
 ------------------------------------------------------------------------------- Attestation signed by Delon LITTIE Searing, MD at 07/10/2024  4:38 PM I evaluated the patient with Dr. Edris and we discussed our assessment and recommendations with the MICU B team.  Patient presented to [facility] on 9/13 in the afternoon with report of ingestion approximately 12 hours prior of 25 tablets of trazodone  and 3 Solo cups of boric acid.  Upon review of his records, his course did follow what is reported for significant ingestions (although overall the literature is sparse).  He had GI symptoms with dehydration and hypotension.  He had an AKI that resolved over his hospital course.  He developed neurologic findings (CNS findings typically include excitation with possible hyperactivity, agitation, hypertonia, hyperreflexia and seizures).  His symptoms resolved and renal function returned to baseline.  He was cleared and being transported via IVC by [facility] when he developed difficulty breathing.  There was concern for oropharyngeal swelling with angioedema and was nasotracheally intubated.  Documents report significant oropharyngeal swelling and ulcerations visualized upon intubation.  He was transferred to our ICU for further care.  Some patients develop dermatologic manifestations.  It typically begins as an erythroderma (often referred to as a boiled lobster appearance which typically begins in the axillary, inguinal and face regions.  I did not see any report of this in the notes from [place].  The time course may be variable, starting as early as the first few hours to a day or two later.  It may then become generalized to include the entire body including oral mucosa.  Desquamation may follow several days later.  This patient does have desquamation that is localized to the face.  I do not see angioedema reported, however there are several references to oral mucosa involvement that otherwise is not well described.  The  time course for development of these findings is not well-described.  There is reference to delayed skin and mucosal injury, however this is overall poorly described.  The mechanism of toxicity is unclear.    Treatment is overall supportive.  Case reports suggest potential benefit from dialysis, however data is sparse and suggest that it is likely most beneficial within the first 12 hours.  Forced diuresis has also been used.  Given he is 5 days out from ingestion, I agree with maintaining adequate hydration and urine output to continue to enhance renal elimination.  Continued monitoring for potential progression of skin and mucosal effects given overall time course is not well described.  We discussed sending out for levels of boric acid in the serum and/or urine.  This would not aid in acute management given the time course, however may be helpful to compare to other reports looking at elimination, which can be prolonged.  Can also consider a secondary ingestion.  Patient was under IVC thus typical procedures should not allow this to occur, but of course no impossible.    Thank you for involving us  in the care of your patient.  Please reach out as questions arise.  Wyvonna Searing, MD Medical Toxicology -------------------------------------------------------------------------------  Inpatient consult to Toxicology Consult performed by: Delon LITTIE Searing, MD Consult ordered by: Marsa Keven Sidle, MD   Toxicology Consult Note  Referring Physician: Dr. Marsa Sidle, MD  Time of Consult Request: 9:10 AM  Chief Complaint/Reasonfor Consult: Airway swelling in the setting of boric acid ingestion  HPI:  Gabriel [name] is a 48 y.o. male with a past medical history significant for multiple suicide attempts, polysubstance use, HIV who was admitted to the MICU due  to airway swelling in the setting of boric acid ingestion, requiring nasopharyngeal intubation.      Patient was admitted to OSH from  9/13-9/16 for intentional drug overdose of boric acid and trazodone .  He had drink 3 cups of boric acid and took 25 pills of trazodone  in an attempt to end his life.  Poison control was contacted at the time with plan for symptomatic management, and patient was admitted to the hospital stepdown unit after receiving 2 L of fluids in the ED with labs demonstrating AKI with BUN 32 and creatinine 4.63.  CT abdomen pelvis at OSH also did not demonstrate any acute findings.  Psychiatry had been consulted as well and cleared the patient from their perspective.  Shortly upon admission, patient was noted to be restless, tachycardic, and hypotensive.  Patient briefly required pressure support with midodrine  and Levophed .  Nephrology was consulted, ultimately assessing that patient likely had prerenal AKI improving with fluids, deferring dialysis at their time of evaluation.  Upon clinical improvement with resolution of hypotension and AKI, patient requested to leave AMA with intent to self-harm, and was then placed on IVC for psychiatric evaluation.  Patient was medically cleared on 9/16 with plan to transfer to behavioral health facility at [facility].  Shortly after arrival to the facility, patient began experiencing respiratory distress, prompting presentation to the ED.   Patient was noted to have desquamating rash to the face, lips, and oropharynx.  In the ED at OSH, patient had decreased mentation, copious thick secretions from the mouth and ears, and tongue completely protruding from the mouth with no airway view.  Patient was taken to the OR for nasopharyngeal intubation.  During intubation, patient was noted to have pharyngeal edema and excoriated oropharyngeal mucosa.  At the time, he was also treated with Decadron , racemic epi, antihistamines, and IM epinephrine .  Patient was transferred to [facility] for further care and management, involving Burn and ENT.  In the ICU, labs demonstrate  no severe electrolyte derangements, BUN 12 and creatinine 0.94, no transaminitis, no lactic acidosis.  CD4 count is 65.  CBC with leukocytosis of 17.3, hemoglobin 9, mild thrombocytopenia 149.  UDS positive for amphetamines, benzos, fentanyl .  Chest x-ray demonstrates mild interstitial edema with right lower lobe atelectasis.  CT chest abdomen pelvis with distal esophageal thickening, multifocal pneumonia, small bilateral pleural effusions, and left ventricular dilation.  CT soft tissue neck with no secondary signs of perforation, and tongue edema.   Medical History[1] Surgical History[2]  Family History[3]  Social History[4] Prescriptions Prior to Admission[5] Allergies[6]    Review of Systems A complete ROS was performed with pertinent positives/negatives noted in the HPI. The remainder of the ROS are negative.  Objective: Vital Signs: Temp:  [96 F (35.6 C)-98.7 F (37.1 C)] 98.2 F (36.8 C) Heart Rate:  [62-79] 74 Resp:  [16-23] 16 BP: (91-107)/(64-91) 96/67 O2 Device: Mechanical ventilation FiO2 (%): 40 % Vent Mode: Pressure regulated volume control/assist control FiO2 Set (%):  [40 %] 40 % S RR:  [18] 18 S VT:  [550 mL] 550 mL PEEP/P Low (set, cm H2O):  [5 cm H20] 5 cm H20    I&O I/O last 3 completed shifts: In: 147.1 [I.V.:47.1; IV Piggyback:100] Out: 1025 [Urine:1025] I/O this shift: In: -  Out: 498 [Urine:498]  Intake/Output Summary (Last 24 hours) at 07/10/2024 1400 Last data filed at 07/10/2024 1200 Gross per 24 hour  Intake 147.12 ml  Output 1523 ml  Net -1375.88 ml  Physical Exam General: Intubated, sedated  HEENT: normocephalic, pupils 5 mm bilaterally minimally reactive (DFE performed by ophtho this AM), desquamating rash to the forehead, midface/nose, and lips with packed oropharynx,   Cardiovascular: RRR, 2+ radial pulses and DP/PT pulses  Chest/Lungs: Mechanically ventilated breath sounds  Abdomen: Soft, nondistended, no guarding  Extremities:  Warm, well-perfused,  no skin desquamation, lesions, or erythema, including in the groin and axilla.  Skin: As above  GU: Foley in place, no swelling or discoloration  Neuro: Sedated on propofol     LABS: Recent Results (from the past 24 hours)  MRSA Screen by PCR   Collection Time: 07/10/24  4:45 AM  Result Value Ref Range   MRSA Screen by PCR Negative Negative  Comprehensive Metabolic Panel   Collection Time: 07/10/24  4:45 AM  Result Value Ref Range   Sodium 139 136 - 145 mmol/L   Potassium 4.9 3.5 - 5.1 mmol/L   Chloride 108 (H) 98 - 107 mmol/L   CO2 22 21 - 31 mmol/L   Anion Gap 9 6 - 14 mmol/L   Glucose, Random 227 (H) 70 - 99 mg/dL   Blood Urea Nitrogen (BUN) 12 7 - 25 mg/dL   Creatinine 9.05 9.29 - 1.30 mg/dL   eGFR >09 >40 fO/fpw/8.26f7   Albumin 3.9 3.5 - 5.7 g/dL   Total Protein 6.2 (L) 6.4 - 8.9 g/dL   Bilirubin, Total 0.9 0.3 - 1.0 mg/dL   Alkaline Phosphatase (ALP) 76 34 - 104 U/L   Aspartate Aminotransferase (AST) 15 13 - 39 U/L   Alanine Aminotransferase (ALT) 22 7 - 52 U/L   Calcium  8.7 8.6 - 10.3 mg/dL   BUN/Creatinine Ratio    Lactic Acid   Collection Time: 07/10/24  4:45 AM  Result Value Ref Range   Lactic Acid 1.1 0.5 - 2.2 mmol/L  Magnesium   Collection Time: 07/10/24  4:45 AM  Result Value Ref Range   Magnesium 1.4 (L) 1.9 - 2.7 mg/dL  Drug Screen, 10 Panel, Urine   Collection Time: 07/10/24  4:45 AM  Result Value Ref Range   Amphetamines Screen, Urine Positive (A) Negative   Barbiturates Screen, Urine Negative Negative   Benzodiazepines Screen, Urine Positive (A) Negative   Buprenorphine Screen, Urine Negative Negative   Cocaine Screen, Urine Negative Negative   Methadone Screen, Urine Negative Negative   Opiates Screen, Urine Negative Negative   Fentanyl  Screen, Urine Positive (A) Negative   Marijuana (THC) Screen, Urine Negative Negative   Oxycodone  Screen, Urine Negative Negative   Creatinine, Urine 57 >=20 mg/dL  Urinalysis with Reflex  to Microscopic   Collection Time: 07/10/24  4:45 AM  Result Value Ref Range   Color, Urine Colorless Yellow   Clarity, Urine Clear Clear   Specific Gravity, Urine 1.010 1.005 - 1.025   pH, Urine 6.0 5.0 - 8.0   Protein, Urine Negative Negative mg/dL   Glucose, Urine 70 (A) Negative mg/dL   Ketones, Urine Negative Negative mg/dL   Bilirubin, Urine Negative Negative   Blood, Urine 2+ (A) Negative   Nitrite, Urine Negative Negative   Leukocyte Esterase, Urine 500 (A) Negative, 25   Urobilinogen, Urine Normal <2.0 mg/dL   WBC, Urine >49 (A) <6 /HPF   RBC, Urine 3-5 (A) 0 - 2 /HPF   Bacteria, Urine Rare None Seen, Rare /HPF   Squamous Epithelial Cells, Urine 0-5 0 - 5 /HPF  Creatinine   Collection Time: 07/10/24  4:45 AM  Result Value Ref Range  Creatinine 0.94 0.70 - 1.30 mg/dL   eGFR >09 >40 fO/fpw/8.26f7  CK, Total   Collection Time: 07/10/24  4:45 AM  Result Value Ref Range   CK, Total 227 (H) 30 - 223 U/L  Phosphorus   Collection Time: 07/10/24  4:45 AM  Result Value Ref Range   Phosphorus 6.0 (H) 2.5 - 5.0 mg/dL  T Cell Subset (CD3, CD4, CD8), Whole Blood   Collection Time: 07/10/24  4:45 AM  Result Value Ref Range   Total Lymphocytes 260.0 (L) 1,040.0 - 3,276.0 mm3   CD3 Percent 57.2 %   CD3 T Cells 149 (L) 690 - 2,540 mm3   CD4 Percent 25.1 (L) 31.0 - 60.0 %   CD4 T Cells 65 (L) 410 - 1,590 mm3   CD8 Percent 34.0 %   CD8 T Cells 88 (L) 190 - 1,140 mm3   CD4/CD8 Ratio 0.7 (L) 0.9 - 2.8   .    Human Immunodeficiency Virus (HIV) 1 Quantitative Real-Time PCR (Plasma)   Collection Time: 07/10/24  4:45 AM  Result Value Ref Range   HIV-1 (RNA) Viral Load Detected (A) Not Detected   HIV-1 RNA, Quantitative PCR 20 (H) <=0 copies/mL   HIV-1 Quant by PCR Log 1.30   Ethanol   Collection Time: 07/10/24  4:45 AM  Result Value Ref Range   Ethanol <10 <10 mg/dL  CBC with Differential   Collection Time: 07/10/24  4:45 AM  Result Value Ref Range   WBC 17.30 (H) 4.40 - 11.00  10*3/uL   RBC 2.76 (L) 4.50 - 5.90 10*6/uL   Hemoglobin 9.0 (L) 14.0 - 17.5 g/dL   Hematocrit 73.8 (L) 58.4 - 50.4 %   Mean Corpuscular Volume (MCV) 94.5 80.0 - 96.0 fL   Mean Corpuscular Hemoglobin (MCH) 32.7 27.5 - 33.2 pg   Mean Corpuscular Hemoglobin Conc (MCHC) 34.6 33.0 - 37.0 g/dL   Red Cell Distribution Width (RDW) 14.9 12.3 - 17.0 %   Platelet Count (PLT) 149 (L) 150 - 450 10*3/uL   Mean Platelet Volume (MPV) 9.2 6.8 - 10.2 fL   Neutrophils % 95 %   Lymphocytes % 3 %   Monocytes % 2 %   Eosinophils % 0 %   Basophils % 0 %   nRBC % 0 %   Neutrophils Absolute 16.30 (H) 1.80 - 7.80 10*3/uL   Lymphocytes # 0.50 (L) 1.00 - 4.80 10*3/uL   Monocytes # 0.40 0.00 - 0.80 10*3/uL   Eosinophils # 0.00 0.00 - 0.50 10*3/uL   Basophils # 0.10 0.00 - 0.20 10*3/uL   nRBC Absolute 0.00 <=0.00 10*3/uL  Vancomycin  Level, Random   Collection Time: 07/10/24  4:45 AM  Result Value Ref Range   Vancomycin  Level, Random 3.4 ug/mL  Blood Gas, Venous   Collection Time: 07/10/24  4:51 AM  Result Value Ref Range   pH, Blood Gas 7.318 (L) 7.320 - 7.420   pCO2, Venous 44.4 None Defined mmHg   pO2, Venous 80.4 None Defined mmHg   HCO3, Blood Gas 23 21 - 28 mmol/L   Base Deficit (-) / Base Excess -3.4 (L) -2.0 - 2.0 mmol/L   O2 Saturation, Venous (measured) 94.2 (H) 70.0 - 80.0 %   Source, Blood Gas Venous   Lactate, Whole Blood   Collection Time: 07/10/24  4:51 AM  Result Value Ref Range   Lactate 1.20 0.50 - 2.20 mmol/L  TOTAL HEMOGLOBIN   Collection Time: 07/10/24  4:51 AM  Result Value Ref Range  Total Hemoglobin 12.3 (L) 14.0 - 18.0 g/dL  Potassium, Whole Blood   Collection Time: 07/10/24  4:51 AM  Result Value Ref Range   Potassium 4.7 (H) 3.4 - 4.5 mmol/L  Sodium, Whole Blood   Collection Time: 07/10/24  4:51 AM  Result Value Ref Range   Sodium 143 136 - 145 mmol/L  Glucose, Whole Blood   Collection Time: 07/10/24  4:51 AM  Result Value Ref Range   Glucose 231 (H) 74 - 100 mg/dL   Ionized Calcium , Whole Blood   Collection Time: 07/10/24  4:51 AM  Result Value Ref Range   Ionized Calcium  1.18 1.15 - 1.33 mmol/L    Review of Ancillary Data: CT Soft Tissue Neck W Contrast  Preliminary Result by Juliene Thomasena Borne, MD (09/17 1258)  CT SOFT TISSUE NECK WITH CONTRAST, 07/10/2024 8:38 AM    INDICATION: Caustic ingestion c/f esophageal injury     COMPARISON: None    TECHNIQUE: Axial CT images from the skull base to the upper chest were   obtained after intravenous administration of iodine-based contrast for the   primary purpose of evaluating the soft tissues of the neck. Portions of   the brain, face, and cervical spine were also included in the imaging   field. Supplemental 2D reformatted images were generated and reviewed as   needed.    All CT scans at [facility] and [facility]   Imaging are performed using radiation dose optimization techniques as   appropriate to a performed exam, including but not limited to one or more   of the following: automatic exposure control, adjustment of the mA and/or   kV according to patient size, use of iterative reconstruction technique.   In addition, our institution participates in a radiation dose monitoring   program to optimize patient radiation exposure.    FINDINGS:  Superficial soft tissues: No acute findings.    Aerodigestive tract: Significant edema throughout the oral tongue which   protrudes through the oral cavity. Secretions and the nasotracheal tube   with its evaluation of the oropharynx which appears coapted, and for which   diffuse edema narrowing cannot be excluded (for example series 1 image   35). Nasotracheal tube partially imaged with secretions aerodigestive   tract. Edentulous. Esophagus is largely decompressed with some fluid   within the partially imaged thoracic esophagus. No abnormal gas or fluid   immediately adjacent to the imaged esophagus.    Salivary  glands: Normal appearance of the parotid and submandibular   glands.    Thyroid gland: Normal appearance.    Lymph nodes: No enlarged or morphologically suspicious lymph nodes in the   neck.    Cervical vascular structures: No acute findings.    Imaged brain, skull base, and face: No acute findings.    Imaged lung apices: Please refer to the contemporaneous CT chest abdomen   pelvis.    Bones: No destructive osseous lesions. Severe right C4-C5 and moderate to   severe right C3-C4 and C5-C6 foraminal stenosis.    IMPRESSION:  1.  Imaged portions of the esophagus are overall unremarkable by CT,   though please note the esophagus is largely decompressed. No secondary   signs of perforation. Endoscopy should be considered for further   evaluation if clinically warranted.  2.  Significant edema throughout the oral tongue which protrudes through   the oral cavity. The nasotracheal tube and secretions limit evaluation of   the oropharynx which appears  coapted, and for which diffuse edema and   narrowing cannot be excluded by imaging. Correlate with direct inspection.  3.  Please refer to the contemporaneous CT chest abdomen pelvis.    CT Chest Abdomen Pelvis W Contrast  Preliminary Result by Norleen Lamar Caller, MD (09/17 1209)  CT CHEST ABDOMEN PELVIS W CONTRAST, 07/10/2024 8:37 AM    INDICATION:  Boric acid ingestion c/f esophageal injury   ADDITIONAL HISTORY: None.  COMPARISON: None.    TECHNIQUE: CT images of the chest, abdomen, and pelvis were obtained   following intravenous administration of iodinated contrast. Conventional   axial reconstructions and multiplanar reformatted images were submitted   for review.      LIMITATIONS: Arterial timing of the examination is mildly limits   evaluation of the viscera and soft tissues.    FINDINGS:    . Thoracic inlet: Within normal limits.  . Chest wall/Axilla: Within normal limits.  . Central airways: Endotracheal tube  terminates mid thoracic trachea.   Small volume secretions in the distal trachea.  . Mediastinum/Hila: Thickening of the distal esophagus. No   pneumomediastinum or free fluid.  SABRA Heart: Left ventricular dilatation. No pericardial effusion.  . Vessels: Aorta normal in caliber. No central pulmonary embolism.  . Lungs/Pleura: Multifocal peribronchial vascular groundglass and nodular   opacities predominantly involving the upper lobes, right middle lobe and   superior segment of the lower lobes. Solid nodule in the right upper lobe   measures approximately 1.6 x 1.9 cm (series 7 image 82). Additional solid   right apical nodule measures 1.8 cm (series 7 image 40). Bibasilar   dependent consolidation favored subsegmental atelectasis. Small bilateral   pleural effusion. Mild bronchial wall thickening. No discernible   pneumothorax.    . Liver: No suspicious focal findings.  . Gallbladder/Biliary: Mildly hyperattenuating material in the gallbladder   lumen could reflect sludge or vicarious biliary contrast excretion.  SABRA Spleen: Unremarkable.  . Pancreas: Unremarkable.  . Adrenals: Unremarkable.  . Kidneys: Unremarkable.    . Peritoneum/Mesenteries/Extraperitoneum: No free air. No free fluid or   loculated drainable collection. No pathologically enlarged lymph nodes.  . Gastrointestinal tract: No evidence of obstruction. Nonspecific   thickening of the right hemicolon likely reflective of prior colitis.    . Ureters: Unremarkable.  . Bladder: Decompressed by Foley catheter.  . Reproductive System: Unremarkable.    . Vascular: Aortobiiliac atherosclerosis. No aneurysm.  . Musculoskeletal: No acute displaced fractures. No aggressive focal bony   lesions. Abdominal wall soft tissues unremarkable.    IMPRESSION:  1.  Thickening of the distal esophagus as can be seen with esophagitis. No   pneumomediastinum or mediastinal free fluid to suggest a transmural   injury/perforation.  2.   Diffuse bilateral groundglass opacities and nodular consolidation   reflecting multifocal pneumonia. Indeterminate nodule in the right lung   apex. Follow-up chest CT is recommended to ensure resolution.  3.  Small bilateral pleural effusions with dependent atelectasis.  4.  Left ventricular dilatation.        XR Chest 1 View  Preliminary Result by Laird JONETTA Lips, MD (09/17 [id number])  XR CHEST 1 VIEW, 07/10/2024 5:31 AM    INDICATION:Intubated, caustic ingestion   COMPARISON: None    FINDINGS:     Supportive devices: Endotracheal tube with tip projecting 6.8 cm above the   carina.  Cardiovascular/lungs/pleura: Cardiac silhouette and pulmonary vasculature   are within normal limits. Prominent interstitial marking. Streaky opacity   in the right lower  lung. No pleural effusion or pneumothorax.   Other: Unremarkable.    IMPRESSION:  *  Mild interstitial edema.  *  Right lower lung streaky opacity, favored to atelectasis.         I have personally reviewed the Radiology reports. I have independently reviewed the images. I have independently reviewed the patient's Medical Records. I have independently reviewed the patient's outside records.  Assessment: Principal Problem:   Ingestion of corrosive chemical, intentional self-harm, initial encounter    (CMD)  Gabriel [name] is a 48 y.o. male who is admitted to the MICU with oropharyngeal/tongue swelling requiring nasopharyngeal intubation in the setting of boric acid ingestion on 9/13.  I suspect the patient could be having a delayed reaction to boric acid ingestion involving the oropharyngeal mucosa, though there are not any definitive published evidence for case reports of this.  Given that boric acid is renally excreted, it is important to maintain good urine output.  Since patient is several days out from boric acid exposure, he likely would not benefit from overdiuresis or hemodialysis.  I do not have any specific recommendations  on wound care for patient's desquamating rash, will defer to burn team.  It will be important to continue to monitor for any further evidence of developing endorgan damage as patient's hospital course continues.    Summary of recommendations: - Continue supportive care and monitor for any evidence of developing end-organ damage (ie changes in respiratory status, electrolyte derangements, AKI, etc) - No sufficient evidence for dialysis or overdiuresis 4 days out from ingestion - Continue to support adequate urine output - Obtain serum and urine boric acid levels   Patient discussed with attending, Dr. Sheliah, who directed the plan of care.   Thank you for this interesting consult.  Please contact [facility] Toxicology Team for further questions.       [1] No past medical history on file. [2] No past surgical history on file. [3] No family history on file. [4] Social History Socioeconomic History  . Marital status: Unknown  Tobacco Use  . Smoking status: Every Day  . Smokeless tobacco: Never  Substance and Sexual Activity  . Alcohol use: Yes  . Drug use: Never   Social Drivers of Health   Food Insecurity: No Food Insecurity (07/06/2024)   Received from [facility]   Food vital sign   . Within the past 12 months, you worried that your food would run out before you got money to buy more: Never true   . Within the past 12 months, the food you bought just didn't last and you didn't have money to get more: Never true  Transportation Needs: No Transportation Needs (07/06/2024)   Received from [facility] - Transportation   . In the past 12 months, has lack of transportation kept you from medical appointments or from getting medications?: No   . In the past 12 months, has lack of transportation kept you from meetings, work, or from getting things needed for daily living?: No  Safety: Not At Risk (07/06/2024)   Received from [facility]   Safety   . Within the last year, have  you been afraid of your partner or ex-partner?: No   . Within the last year, have you been humiliated or emotionally abused in other ways by your partner or ex-partner?: No   . Within the last year, have you been kicked, hit, slapped, or otherwise physically hurt by your partner or ex-partner?: No   .  Within the last year, have you been raped or forced to have any kind of sexual activity by your partner or ex-partner?: No  Living Situation: Low Risk  (07/06/2024)   Received from [facility] Situation   . In the last 12 months, was there a time when you were not able to pay the mortgage or rent on time?: No   . In the past 12 months, how many times have you moved where you were living?: 0   . At any time in the past 12 months, were you homeless or living in a shelter (including now)?: No  [5] Medications Prior to Admission  Medication Sig Dispense Refill Last Dose/Taking  . acetaminophen  (TYLENOL ) 325 mg tablet Take 650 mg by mouth every 6 (six) hours as needed for mild pain (1-3), headaches or fever 100.4 F or GREATER.   Unknown  . bictegrav-emtricit-tenofov ala ([name]) 50-200-25 mg tab per tablet Take 1 tablet by mouth daily.   Unknown  . chlordiazePOXIDE  (LIBRIUM ) 25 mg capsule Take by mouth. Take 1 capsule (25 mg total) by mouth 2 (two) times daily in the am and at bedtime. for 1 day, THEN 1 capsule (25 mg total) daily for 1 day.   Unknown  . ergocalciferol  (VITAMIN D2) 1,250 mcg (50,000 unit) capsule Take 50,000 Units by mouth once a week.   Unknown  . fexofenadine -pseudoePHEDrine (Allegra-D 12 Hour) 60-120 mg 12 hr tablet Take 1 tablet by mouth 2 (two) times a day.   Unknown  . folic acid  (FOLVITE ) 1 mg tablet Take 1 mg by mouth daily.   Unknown  . hydrOXYzine  (ATARAX ) 25 mg tablet Take 25 mg by mouth every 6 (six) hours as needed for anxiety.   Unknown  . Lidocaine  Viscous 2 % viscous solution Apply as needed to anal canal 15 mL 1 Unknown  . multivit-iron-folic acid -calcium -mins  (THERA-M) 9 mg iron-400 mcg tablet Take 1 tablet by mouth daily.   Unknown  . pantoprazole  (PROTONIX ) 40 mg EC tablet Take 40 mg by mouth every morning before breakfast.   Unknown  . rosuvastatin  (CRESTOR ) 20 mg tablet Take 20 mg by mouth nightly.   Unknown  . sertraline  (ZOLOFT ) 50 mg tablet Take 50 mg by mouth daily.   Unknown  . thiamine  (VITAMIN B1) 100 mg tablet Take 100 mg by mouth daily.   Unknown  . traZODone  (DESYREL ) 50 mg tablet Take 50 mg by mouth at bedtime.   Unknown  [6] Allergies Allergen Reactions  . Abacavir Other (See Comments)    HLA B5701 positive - should not receive abacavir due to risk of hypersensitivity

## 2024-07-10 NOTE — Progress Notes (Signed)
 Called by micro for positive BC results from 9/16.  GRAM POSITIVE COCCI IN CHAINS IN 2/2 AEROBIC AND ANAEROBIC BOTTLES  BCID detected streptococcus species and streptococcus pyogenes   Patient transferred from [facility] to [facility] 04 ICU MEDICINE UNIT to continue care.  ICU at [facility] was called [phone]), and I spoke with RN Suzen Moselle who will speak with attending regarding results above.

## 2024-07-10 NOTE — ED Provider Notes (Signed)
  Provider Note MRN:  [id number]  Arrival date & time: 07/10/24    ED Course and Medical Decision Making  Assumed care of patient at sign-out or upon transfer.  Boric acid ingestion with severe tongue swelling, and required OR intervention for nasotracheal intubation.  Being transferred to [facility].  Stable on my assessment.  Dr. Sherlean Servant is the accepting MD at [facility].  .Critical Care  Performed by: Theadore Ozell HERO, MD Authorized by: Theadore Ozell HERO, MD   Critical care provider statement:    Critical care time (minutes):  35   Critical care was necessary to treat or prevent imminent or life-threatening deterioration of the following conditions:  Sepsis (Airway compromise)   Critical care was time spent personally by me on the following activities:  Development of treatment plan with patient or surrogate, discussions with consultants, evaluation of patient's response to treatment, examination of patient, ordering and review of laboratory studies, ordering and review of radiographic studies, ordering and performing treatments and interventions, pulse oximetry, re-evaluation of patient's condition and review of old charts   Final Clinical Impressions(s) / ED Diagnoses     ICD-10-CM   1. Severe tongue swelling  R22.0     2. Rash of face  R21     3. Respiratory distress  R06.03     4. Adverse effect of boric acid, initial encounter  T49.0X5A     5. Sepsis with acute respiratory failure, due to unspecified organism, unspecified whether hypoxia or hypercapnia present, unspecified whether septic shock present (HCC)  A41.9    R65.20    J96.00     6. Hypotension, unspecified hypotension type  I95.9       ED Discharge Orders     None       Discharge Instructions   None     Ozell HERO. Theadore, MD [facility] Emergency Medicine [facility] [email or web address]    Theadore Ozell HERO, MD 07/10/24 [phone]

## 2024-07-10 NOTE — Progress Notes (Signed)
 Division of Pharmacy Services  Medication History Completion Note  Name/DOB/Age of Patient: Gabriel [name] / 01/07/1976 / 48 y.o.  Location: [facility] 04 ICU  Type: Hospital admission & Modality: Facility Med List  Confirmed two patient identifiers: Yes  Confirmed patient is alert & oriented: No  Medication History Source (Med History Informants):  Dispense History Care Everywhere   Asked about any missing medications (such as pumps, injectable meds, TPN, OTC, etc): No  PTA Med List:  Prior to Admission Medications     Reviewed by Lorn Nephew, CPhT on 07/10/24 at 1139    Medication Sig Last Dose Informant Taking? Status  acetaminophen  (TYLENOL ) 325 mg tablet Take 650 mg by mouth every 6 (six) hours as needed for mild pain (1-3), headaches or fever 100.4 F or GREATER. Unknown Other No Active         Med Note AZUCENA, MANDY   Wed Jul 10, 2024 11:31 AM) Start 9/16 per [place]  bictegrav-emtricit-tenofov ala ([name]) 50-200-25 mg tab per tablet Take 1 tablet by mouth daily. Unknown Other No Active         Med Note AZUCENA, MANDY   Wed Jul 10, 2024 11:39 AM) Unable to verify with [facility] or CareEverywhere  chlordiazePOXIDE  (LIBRIUM ) 25 mg capsule Take by mouth. Take 1 capsule (25 mg total) by mouth 2 (two) times daily in the am and at bedtime. for 1 day, THEN 1 capsule (25 mg total) daily for 1 day. Unknown Other No Active         Med Note AZUCENA LORN   Wed Jul 10, 2024 11:32 AM) Start 07/09/2024 End 07/11/2024  ergocalciferol  (VITAMIN D2) 1,250 mcg (50,000 unit) capsule Take 50,000 Units by mouth once a week. Unknown Other No Active         Med Note AZUCENA, MANDY   Wed Jul 10, 2024 11:36 AM) New RX filled at [facility] 9/17  fexofenadine -pseudoePHEDrine (Allegra-D 12 Hour) 60-120 mg 12 hr tablet Take 1 tablet by mouth 2 (two) times a day. Unknown Other No Active         Med Note AZUCENA, MANDY   Wed Jul 10, 2024 11:32 AM) Start 11/27/2019 per [place]  folic acid  (FOLVITE )  1 mg tablet Take 1 mg by mouth daily. Unknown Other No Active         Med Note AZUCENA LORN   Wed Jul 10, 2024 11:33 AM) Start 07/10/2024 End 08/09/2024   hydrOXYzine  (ATARAX ) 25 mg tablet Take 25 mg by mouth every 6 (six) hours as needed for anxiety. Unknown Other No Active         Med Note AZUCENA LORN   Wed Jul 10, 2024 11:33 AM) Start 07/09/2024  Lidocaine  Viscous 2 % viscous solution Apply as needed to anal canal Unknown Other No Active         Med Note AZUCENA, MANDY   Wed Jul 10, 2024 11:38 AM) Unable to verify via [facility] or Care Everywhere   multivit-iron-folic acid -calcium -mins (THERA-M) 9 mg iron-400 mcg tablet Take 1 tablet by mouth daily. Unknown Other No Active         Med Note AZUCENA LORN   Wed Jul 10, 2024 11:34 AM) Start 9/17 End 10/17  pantoprazole  (PROTONIX ) 40 mg EC tablet Take 40 mg by mouth every morning before breakfast. Unknown Other No Active         Med Note AZUCENA LORN   Wed Jul 10, 2024 11:35 AM) Start 9/16 End 9/30  rosuvastatin  (  CRESTOR ) 20 mg tablet Take 20 mg by mouth nightly. Unknown Other No Active         Med Note AZUCENA, MANDY   Wed Jul 10, 2024 11:37 AM) LF 04/01/2024 qty 30 tabs w/ 3 RFs  sertraline  (ZOLOFT ) 50 mg tablet Take 50 mg by mouth daily. Unknown Other No Active         Med Note AZUCENA, MANDY   Wed Jul 10, 2024 11:37 AM) LF 02/06/2024 qty 30 tabs for 30 days w 5 RFs  thiamine  (VITAMIN B1) 100 mg tablet Take 100 mg by mouth daily. Unknown Other No Active         Med Note AZUCENA LAMP   Wed Jul 10, 2024 11:35 AM) Start 07/09/2024 End 08/09/2024   traZODone  (DESYREL ) 50 mg tablet Take 50 mg by mouth at bedtime. Unknown Other No Active         Med Note AZUCENA, MANDY   Wed Jul 10, 2024 11:37 AM) LF 04/01/2024 qty 30 tabs for 30 days w/ 3 RF           Audit from Redirected Encounters   **Prior to Admission medications have not yet been reviewed for this encounter**     Selected Pharmacy:  [id number] - [place] ([place]),  [place] - [address] - PHONE: [phone] - FAX: [phone]  Comments: See PTA med list notes  Medications reconciled by provider: No  Patient unable to verify (ventilator). No contacts listed. Doc last dispense records and [place] recent discharge 9/16 ===View-only below this line=== LAMP Nephew, CPhT Wed Jul 10, 2024 11:30 AM Per recent discharge from [facility]: Stop: Fenofibrate  18m, Ibu 600mg , Nicotine  patch and lozenge.    Signature/Co-signature, if required: LAMP Nephew, CPhT   Date/Time: 07/10/2024 11:41 AM

## 2024-07-10 NOTE — ED Notes (Signed)
 [facility] called and Pt removed from their transport list.

## 2024-07-10 NOTE — ED Notes (Signed)
[facility] called and transportation setup  

## 2024-07-10 NOTE — Progress Notes (Signed)
 [facility] here to take patient to [facility]

## 2024-07-10 NOTE — Care Plan (Signed)
 48 y.o. male with PMHx of polysubstance abuse (cocaine, methamphetamines, benzos, opiates, ETOH), previous suicide attempts, and HIV, elevated from EMR, patient presented at [facility] ED with chief complaints of gastric upset due to intentional drug overdose of boric acid and unknown amount of trazodone .  admitted with Suicide Attempt by Intentional Drug Overdose (cocaine, amphetamines, boric acid) and Acute Kidney Injury. Course complicated by drug induced hypotension briefly requiring vasopressors so patient was admitted in the ICU.  Patient appeared to stabilize and was discharged under the county sheriff's care.  Shortly after developed respiratory distress and was brought the ED and underwent nasotracheal intubation. Became hypotensive peri-intubation  requiring levophed .  Off vasopressors now. Minimal ventilatory settings.  Significant swelling of the tongue and peeling of the skin around the mouth.   -Given recent ingestion and now evidence of worsening orpharyngeal injury, will do protonix  IV BID  -Needs CT Chest/Abdomen/Neck tonight with contrast.  -Hold off on abx for now.   -We could consider alternatives causes to his significant tongue and laryngeal edema, such as T2 inflammatory responses that could respond to steroids and antihistamines but at this point ockam's razor would seem to indicate this is secondary to his ingestion.

## 2024-07-11 NOTE — Consults (Signed)
 Infectious Disease Initial Consultation Note  Reason for consult: Recommendations regarding HIV medications and antibiotic therapy for Strep pyogenes infection   Assessment and Recommendations:   # HIV # Streptococcus pyogenes bacteremia # Concern for septic shock, resolved 48 year old male with a past medical history notable for HIV on [name]  as an outpatient in addition to depression with multiple suicide attempts, prior polysubstance abuse with cocaine and amphetamines, alcohol use disorder, hospitalized in the medical ICU here at [facility] after intentional drug overdose of boric acid and trazodone .  He was transferred to [facility] after developing airway compromise and a facial rash while in transit to a behavioral health facility under an IVC given persistent suicidal ideation at outside hospital.  The medical ICU team subsequently consulted infectious diseases for recommendations regarding his HIV medications as well as for antibiotic recommendations in the setting of a positive Streptococcus pyogenes blood culture result from the outside hospital.  With regards to his HIV, the patient is continued on [name]  as an outpatient which is a long-acting injectable medication administered every 2 months.  His most recent dose was on 05/27/2024 so he does not require any additional inpatient meds at this time.  Would recommend discontinuing his [name] as he is not currently taking this medication.  Additionally, his CD4 count upon my review is typically in the 500s, most recently 515 as of 07/07/2024.  His CD4 count here is in the 60s, likely lower in the setting of acute stress response.  Therefore, we do not feel he would actively need PJP prophylaxis once enteral access is obtained.  With regards to his Streptococcus pyogenes positive blood culture result, would recommend obtaining outside hospital documentation so that we can better characterize his ideal antibiotic choice.  Nonetheless, ceftriaxone   should provide appropriate coverage for now.  With regards to his concern for streptococcal toxic shock syndrome, we have low clinical suspicion for this given his overall clinical improvement, lack of pressor requirements, AKI resolution, and the appearance of his rash.  We therefore feel that it is reasonable to continue on ceftriaxone  monotherapy with possible narrowing once OSH micro data is obtained.  Recommendations: With regards to Strep pyogenes bacteremia; 1.  Continue ceftriaxone  2 g IV every 24 hours 2.  Lower suspicion for streptococcal toxic shock syndrome given evidence of significant clinical improvement.  Although an argument could be made that he previously met the criteria for TSS, his clinical course does not match what we would expect for TSS and therefore we have low suspicion that this is the etiology of his rash. 3.  Recommend obtaining OSH cultures and susceptibility 4.  Repeat blood cultures q 48 hours until clearance 5.  Recommend obtaining formal TTE  With regards to his HIV infection: 1.  Upon further review, patient is continued on [name]  (cebotegravir and rilpivirine ) as an outpatient. This is a long-acting injectable administered every two months. It appears he received his most recent dose on 05/27/2024.  He is not on [name] at home.  As such, would discontinue [name] from medication list.  Patient does not require any inpatient HIV medications at this time. 2.  Additional review demonstrates that his CD4 count is typically in the 500s.  Most recent measurement is 515 on 07/07/2024.  Therefore, patient also does not require PJP prophylaxis at this time as his CD4 count is likely decreased in the setting of acute stress response.   Patient seen/examined/discussed with the attending, Dr. Vonn.  Infectious Diseases will continue to follow.  Gabriel Raya,  MD [facility] Internal Medicine PGY-2  07/11/2024 1:24 PM  HPI :   Gabriel [name] is a 48 y.o. male  with a past medical history significant for .  48 year old male with a past medical history notable for HIV (most recent CD4 count 515 07/07/24), depression with multiple suicide attempts, prior polysubstance abuse with cocaine and amphetamine, alcohol use disorder.  The patient had a recent hospitalization from 9/13-6 9/16 for intentional drug overdose of boric acid (3 solo cups worth) and trazodone .  Poison control was consulted at the outside hospital, and he was also managed there for rhabdomyolysis, positive UDS with cocaine and amphetamines, AKI.  Patient reportedly attempted to leave AMA on 9/15 but was subsequently placed under an IVC due to suicidal ideation.  He was discharged from OSH on 9/16 after stabilization and was planned to go to a behavioral health inpatient facility under involuntary commitment when he acutely decompensated en route developing tripoding, tachypnea, thick secretions from the nose and mouth.  He was also appreciated to have rapidly worsening swelling of the nose, tongue, and a peeling rash of the face.  EMS emergently diverted to the nearest ED and he was brought to the operating room for emergent nasopharyngeal intubation with anesthesia team and ENT.  At that time, he was found to have copious thick secretions, pharyngeal edema, excoriated oropharyngeal mucosa.  He was then transferred to the medical ICU here at [facility] for management of caustic ingestion and airway edema on 9/17.  At this time, the patient continues to be intubated, sedated, but is no longer requiring vasopressor support.  ENT, ophthalmology, and burn have all been consulted for this patient.  Currently, burn, toxicology, ENT all feel consistent with boric acid ingestion.  Primary team tentatively planning for extubation on 9/19.  Preliminary micro data from the outside hospital was: 9/14: Blood culture x2> no growth to date 9/14: MRSA PCR>> negative  9/14: C.diff negative   However, primary team  was notified overnight by nurse at the outside hospital that blood cultures have returned positive for Strep pyogenes.  We do not yet have documentation from this blood culture as to how many bottles were positive and if sensitivities were included.  We have requested primary team obtain this information.  Additionally, HIV tests on admission here at [facility] notable for positive HIV-1 RNA with 20 copies per mL and a CD4 count of 65.  On upon my examination, the patient was intubated and sedated and therefore not able to participate in the exam.  He did not have any obvious skin wounds with the exception of a peeling rash on his face that appeared improved over pictures documented in the chart previously.  He otherwise was not appreciated to have any skin wounds, lesions, rashes elsewhere.  He was no longer requiring vasopressor support was continued on sedating medications and mechanically ventilated.    Antimicrobial History: Ceftriaxone : 9/18 - present Vancomycin : 9/17 - 9/18 Pip-tazo: 9/17 - 9/18   ROS :   All systems reviewed and were negative unless otherwise stated in HPI.    The PMH, PSH, FH, and SH were reviewed and updated by me as appropriate on 07/11/2024.  Medical History[1]  Surgical History[2]  Family History[3]  Social History[4]    Medications & Allergies:   Allergies[5]   bictegrav-emtricit-tenofov ala, 1 tablet, oral, Daily artificial tears, 1 drop, Both Eyes, Q2H SCH atorvastatin, 80 mg, oral, At Bedtime cefTRIAXone , 2 g, intravenous, Q24H dexAMETHasone , 10 mg, intravenous, Q6H SCH enoxaparin ,  40 mg, subcutaneous, At Bedtime folic acid , 1 mg, oral, Daily insulin aspart (NovoLOG)/insulin lispro (HumaLOG) injection, 0-10 Units, subcutaneous, 4x Daily moxifloxacin, 1 drop, Right Eye, 4x Daily pantoprazole , 40 mg, intravenous, Q12H SCH thiamine , 100 mg, intravenous, Daily tobramycin-dexAMETHasone , 0.5 inch, Both Eyes, 6x Daily     Vital Signs:   Temp:   [96.3 F (35.7 C)-99.3 F (37.4 C)] 98.1 F (36.7 C) Heart Rate:  [56-84] 70 Resp:  [8-24] 16 BP: (80-121)/(54-97) 96/68  Physical Exam :  Const: Intubated, sedated, mechanically ventilated. Head: normocephalic.  Evidence of flaking rash without erythema or purulence appreciated on the anterior aspect of the face.  Appears improved over photographs previously documented in the chart. Eyes: Pupils dilated bilaterally, though note patient recently had ophthalmology dilated exam.  No scleral icterus. Mouth: Mild tongue swelling appreciated, though difficult to fully evaluate as the patient is currently intubated Cardio: regular rate and rhythm, no murmurs appreciated Resp: clear to auscultation bilaterally GI: soft and nontender, non-distended GU: deferred Skin: Thorough skin exam unrevealing of rashes, lesions, or ulcers  MSK: normal tone  Micro:   Blood culture: Preliminary micro data from the outside hospital was: 9/14: Blood culture x2> no growth to date 9/14: MRSA PCR>> negative  9/14: C.diff negative  - Reported blood culture positive for Streptococcus pyogenes, awaiting primary team to obtain this information from OSH  Micro data from [place]: 9/17: Blood culture NGTD  Urine culture: 9/17: Urine culture NGTD  Respiratory culture: No respiratory cultures collected here at [facility]:   Lab Results  Component Value Date   WBC 11.60 (H) 07/11/2024   HGB 10.9 (L) 07/11/2024   HCT 31.0 (L) 07/11/2024   MCV 94.4 07/11/2024   PLT 122 (L) 07/11/2024    Lab Results  Component Value Date/Time   NA 141 07/11/2024 0046   K 4.6 07/11/2024 0046   CL 108 (H) 07/11/2024 0046   CO2 25 07/11/2024 0046   BUN 15 07/11/2024 0046   CREATININE 0.98 07/11/2024 0046    Lab Results  Component Value Date   ALT 22 07/10/2024   AST 15 07/10/2024   BILITOT 0.9 07/10/2024    No results found for: SEDRATE  No results found for: CRP   Imaging:   ATTENDING  ADDENDUM:  I have personally interviewed and examined the patient and co-developed the impression and plan/recommendations above.  I have amended the note above where needed to reflect my thoughts.  Olena Ollis Minder, MD Professor of Medicine Internal Medicine/Infectious Diseases       [1] No past medical history on file. [2] No past surgical history on file. [3] No family history on file. [4] Social History Tobacco Use  . Smoking status: Every Day  . Smokeless tobacco: Never  Substance Use Topics  . Alcohol use: Yes  . Drug use: Never  [5] Allergies Allergen Reactions  . Abacavir Other (See Comments)    HLA B5701 positive - should not receive abacavir due to risk of hypersensitivity

## 2024-07-11 NOTE — Consults (Signed)
 Nutrition Note   PATIENT NAME: Gabriel [name] DATE: 07/11/2024  TIME: 12:59 PM  Note Type: Assessment Referral Reason: Intubation  Assessment  48 y.o. male with a past medical history significant for multiple suicide attempts, polysubstance use, HIV who was admitted to the MICU due to airway swelling in the setting of boric acid ingestion, requiring nasopharyngeal intubation   Pt is nasally intubated. No enteral access at this time. Team hopes to extubate tomorrow and then GI may scope. Per CT 9/17: thickening of the distal esophagus as can be seen with esophagitis. Per ENT note 9/18: No obvious pharyngeal injuries to posterior lateral pharyngeal walls, tongue base, or vallecula.   Interventions  Nutrition Intervention: Enteral nutrition, General healthful diet  If unable to extubate, propofol  @ 40 mcg/kg/min, and enteral access obtained, suggest: Osmolite 1.5 @ 55 ml/hr goal --> 1320 ml, 1980 kcal, 83 g prot, and 1003 ml water. Prostat, 1 pkt TID --> 300 kcal and 45 g prot. Free water flushes per team discretion. Free water flush of 30 ml q4 hrs for tube patency.  Once propofol  d/c'd, recommend Osmolite 1.5 @ 70 ml/hr goal --> 1680 ml, 2520 kcal, 105 g prot, and 1277 ml water. Prostat, 1 pkt TID --> 300 kcal and 45 g prot. Po diet as medically feasible and per SLP once extubated.    Nutrition Diagnosis      Nutrition Diagnosis: Inadequate oral intake Problem Related To: Acute illness, Intubation Problem as Evidenced By: NPO      Nutrition Focused Physical Findings  Deferred Due To: NFPE not indicated     Pertinent Information  Anthropometrics Height: 198.1 cm (6' 6) Weight: 114 kg (252 lb 3.2 oz) Weight History/Comments: 9/18- 252# Wt Readings from Last 5 Encounters:  07/11/24 114 kg (252 lb 3.2 oz)  05/14/21 109 kg (240 lb 3.2 oz)  02/17/21 105 kg (230 lb 9.6 oz)   Weight Classification: Overweight BMI: 29.14 Skin Integrity: no PIs  Scheduled  Meds: bictegrav-emtricit-tenofov ala, 1 tablet, oral, Daily artificial tears, 1 drop, Both Eyes, Q2H SCH atorvastatin, 80 mg, oral, At Bedtime cefTRIAXone , 2 g, intravenous, Q24H dexAMETHasone , 10 mg, intravenous, Q6H SCH enoxaparin , 40 mg, subcutaneous, At Bedtime folic acid , 1 mg, oral, Daily insulin aspart (NovoLOG)/insulin lispro (HumaLOG) injection, 0-10 Units, subcutaneous, 4x Daily moxifloxacin, 1 drop, Right Eye, 4x Daily pantoprazole , 40 mg, intravenous, Q12H SCH thiamine , 100 mg, intravenous, Daily tobramycin-dexAMETHasone , 0.5 inch, Both Eyes, 6x Daily  Continuous Infusions: fentaNYL , 0-200 mcg/hr, Last Rate: 100 mcg/hr (07/11/24 1140) propofoL , 0-50 mcg/kg/min, Last Rate: 40 mcg/kg/min (07/11/24 1140)  PRN Meds:. .  dextrose  .  dextrose  .  fentaNYL  .  polyethylene glycol  Labs: mag 2.9; phos 5.2 GI Symptoms: None Last BM Date:  (PTA)    Current Diet and Nutrition Support Details  Diet NPO  Propofol  @ 25.9 ml/hr --> est 684 lipid kcal/day.    Estimated Needs  Nutrition Needs Based On: IBW (97.3 kg)   Calories (kcal/day): 2919  Calories based on: 25-30 kcal/kg Protein (g/day): 117-146  Protein based on: 1.2-1.5g/kg, IBW Fluid (mL/day):   1-1.1 mL/kcal   Monitoring & Evaluation  Nutrition Goals: Other (comment) (tolerate EN if enteral access feasible) Nutrition Goal Status: Initial   Follow-up Information  Follow-Up Date: 07/16/24 Follow-Up Reason: Reassessment   Contact RD on treatment team. After hours or weekends, use secure chat group: [facility]  [facility]  Lamarr Piedra, RD 07/11/2024 12:59 PM

## 2024-07-11 NOTE — Progress Notes (Addendum)
 MD advanced nasal ETT. RT assist.    07/11/24 0819  Vent Information  $Vent Daily Charge-Subs Yes  Ventilator Type Hospital Critical Care Ventilator  Vent ID Servo U  Vent Mode PRVC/PCVVG/VC+  Ventilator Protocol Documentation  S/F (SpO2/FiO2) - Calculated 247.5  Settings  FiO2 Set (%) 40 %  Resp Rate (Set) 18  Vt (Set, mL) 550 mL  PEEP/P Low (set, cm H2O) 5 cm H20  Insp Time (sec) 1 sec  Insp Rise Time (sec) 0.2 seconds  Trigger Sensitivity Flow (L/min) 1 L/min  Humidification Heat and moisture exchanger  I:E Ratio 1:2.33  Inspiratory Component of I:E Ratio Setting 0.43  MV Set (L/min) 9.9 L/min  Readings  SpO2 99 %  ETCO2 (mmHg) 38 mmHg  Peak Inspiratory Pressure (cm H2O) 39.7 cm H2O  Mean Pressure (cm H2O) 25.5  Minute Ventilation (L/min) 16.94 L/min  Total Respiratory Rate (bpm) 33  Plateau Pressure (cm H2O) 16 cm H2O  Static Compliance (mL/cm H2O) 62.71 mL/cm H2O  Dynamic Compliance (mL/cm H2O) 19.88 mL/cm H2O  Vt (observed, mL) 650 mL  Vt Mandatory Exp (mL) 689.8 mL  Alarms  Insp Pressure High (cm H2O) 45 cm H2O  PEEP Min 2 cm H2O  RR High 40  RR Low 8  MV High (L/min) 20 L/min  MV Low (L/min) 4 L/min  ETCO2/TCM High (mmHg) 60 mmHg  ETCO2/TCM Low (mmHg) 20 mmHg  RT Airway Charges  $RT Airway Charge Airway management  $RT Airway Management Other airway management (comment) (Nasal ET tube advanced by MD, RT assist)  ETT  7 mm  Placement Date/Time: 07/09/24 (c) 2219   Placed by External Staff?: Other hospital  Technique: (c)   Tube Size: 7 mm  Location: Right Nare  Grade View: Neither glottis nor epiglottis visible  Insertion attempts: 1  Placement Verification: Auscultation...  Secured at (cm) (S)  31 (no numbers on tube at this length)  Measured from Nare  Secured Location  (Right Nare)  Secured by Commercial tube holder (sutured)  Site Condition Dry  Cuff Pressure 26 mmHg  Cuff Pressure Method Measured  Airway Suctioning/Secretions  Suction Type  Endotracheal tube  Suction Device Inline  Secretion Amount Small  Secretion Color Tan;Clear  Secretion Consistency Thick;Thin  Suction Tolerance Tolerated fairly well  Suctioning Adverse Effects Anxiety  Oral Suctioning/Secretions  Suction Type Oral  Suction Device Yankauer  Secretion Amount Moderate  Secretion Color Clear;Tan  Secretion Consistency Thick;Thin  Suction Tolerance Tolerated fairly well  Suctioning Adverse Effects Anxiety  Extubation Screening  Extubation Screening Adult SBT  B = Both Spontaneous Awakening and Breathing Trials  Was patient receiving mechanical ventilation? Yes  Safety Screen Spontaneous Breathing Trial (SBT)  Proceed with SBT - No exclusion criteria met  Spontaneous Breathing Trial (SBT) Outcome SBT failed  SBT Failure Criteria Respiratory rate greater than 35/min (55)  Pre SBT/ERT Information  [place] Agitation Sedation Scale (RASS) +1  SBT/ERT Procedure  Weaning Start Time [phone]  Weaning Stop Time 0819  Total SBT/ERT Time (Calculation) 3 Minutes  SBT/ERT Pressure Support 5 cmH2O  Tidal Volume (mL) 650 mL  Resp Rate 55  RSBI Calculated Value 84.62  Post SBT/ERT Information  Post SBT/ERT Action Failed placed back on previous settings  Name of Provider Notified About SBT/ERT Outcome Micu B Team  Weaning Parameters  Spontaneous Minute Volume (MV) 16.46

## 2024-07-11 NOTE — Care Plan (Signed)
 MICU DAILY ICU CHECKLIST  Feeding None currently (no access)  Analgesia/Sedation fentanyl  drip and propofol  drip  Thromboprophylaxis On Lovenox   Head of bed elevation Yes  Ulcer prophylaxis if on ventilator Yes  Glucose control No concern  SAT/SBT SAT/SBT failed   Bowel regimen Yes  Lines Discussed decannulation, as appropriate  Deescalation of antibiotics NA  Family updated Every attempt will be made  Disposition Remain in ICU     Electronically signed by: Daryel Lurlean Dolly, MD 07/11/2024 6:45 AM

## 2024-07-11 NOTE — Progress Notes (Signed)
 MICU Attending Note  Thu 07/11/2024  Gabriel [name]  48 y.o.  [id number]/A   I have seen and examined the patient with the resident/fellow and team.  We discussed the plan and I agree. Please see resident/fellow note for full details.    Briefly this is a 48 y.o.male who presented after ingestion of boric acid several days ago who after recent hospital discharge developed severe tongue swelling requiring intubation. He otherwise is hemodynamically stable. Fortunately, CT scan and ENT exam do not reveal any concerning involvement of the airway outside of the mouth or the GI tract. We are giving steroids to help with the swelling. The swelling has improved significantly. This morning he had a significant cuff leak and became agitated in an attempted SBT/SAT with tachypnea. We resedated and advanced the tube.  Additionally, outside blood cultures are growing strep pyogenes. An alternative diagnosis could red man syndrome with a desquamating rash, strawberry tongue?. Will continue steroids today and hopeful we will be able to attempt intubation tomorrow  ICU problems: # Toxic ingestion # Acute hypoxemic respiratory failure # strep pyogenes bacteremia  I have personally completed the following: reviewed the pertinent medical records including overnight events if applicable, discussed the patient with multidisciplinary critical care team, reviewed the laboratory and microbiology data, reviewed the pertinent imaging/radiographic results, reviewed pertinent medications, discussed the patient with the bedside nurse, and discussed respiratory management with the respiratory therapist.   This patient is critically ill with the following life-threatening issues requiring my independent presence at the bedside:    oxygenation/ventilation instability requiring invasive mechanical ventilation  cardiac or rhythm disturbances requiring evaluation and/or interventions   Additionally, I have provided: communication with  the patient and/or family as available to provide information, discussion of plans for initiating the discharge or post-hospital care, assessment and management of pain with intravenous or oral analgesics, optimization of pulmonary toilet and overall respiratory function, management of nutritional deficiencies, and evaluation of and correction of electrolyte abnormalities as pertinent.  I personally spent 60 minutes on same day face-to-face evaluation, mangement, same-day documention and care coordination of this patient.  This patient is critically ill requiring ICU level care and at risk for acute decompensation.  This noted time is independent of any procedures completed today.   ----- Keven Sidle MD PhD MICU Attending Pulmonary and Critical Care Medicine

## 2024-07-11 NOTE — Progress Notes (Signed)
 Case Management Update  Date: 07/11/2024   Time: 8:59 AM   Patient Type: Inpatient  Pt remains nasally intubated in MICU. SW has received call from HIV coord--jim. He states he has spoken with [facility]--pt last seen in August for medication. They have him listed as having [employer] insurance. SW has left message for financial-renee ([phone])--indicating [facility] has pt having [employer]. Dc date, needs TBD.  Jim--HIV coord--confirms pt has not been seen at this hospital.  Dc date, needs TBD. SW to follow.        Case Management Coordination Status: Coordination In-Progress    Anticipated Discharge Location: Home  If Plan A discharging location is not feasible: Potential Plan B: To be determined  Elvie Russell, MSW   [phone]

## 2024-07-11 NOTE — Care Plan (Signed)
  Problem: Compromised Skin Integrity Goal: Skin integrity is maintained or improved Description: Assess and monitor skin integrity. Identify patients at risk for skin breakdown on admission and per policy. Collaborate with interdisciplinary team and initiate plans and interventions as needed.    Outcome: Progressing Goal: Fluid and electrolyte balance are achieved/maintained Description: Assess and monitor vital signs (orthostatic vitals if applicable), fluid intake and output, urine color, labs, skin turgor, mucous membranes, jugular venous distention, edema, circumference of edematous extremities and abdominal girth, respiratory status, and mental status.  Monitor for signs and symptoms of hypovolemia (tachycardia, rapid breathing, decreased urine output, postural hypotension, confusion, syncope).  Monitor for signs and symptoms of hypervolemia (strong rapid pulse, shortness of breath, difficulty breathing lying down, crackles heard in lung fields, edema). Collaborate with interdisciplinary team and initiate plan and interventions as ordered. Outcome: Progressing Goal: Nutritional status is improving Description: Monitor and assess patient for malnutrition (ex- brittle hair, bruises, dry skin, pale skin and conjunctiva, muscle wasting, smooth red tongue, and disorientation). Collaborate with interdisciplinary team and initiate plan and interventions as ordered.  Monitor patient's weight and dietary intake as ordered or per policy. Utilize nutrition screening tool and intervene per policy. Determine patient's food preferences and provide high-protein, high-caloric foods as appropriate.  Outcome: Not Progressing   Problem: Urinary Incontinence Goal: Perineal skin integrity is maintained or improved Description: Assess genitourinary system, perineal skin, labs (urinalysis), and history of incontinence to include past management, aggravating, and alleviating factors.  Collaborate with  interdisciplinary team and initiate plans and interventions as needed. Outcome: Progressing   Problem: Physical Regulation: Goal: Ability to maintain a stable neurologic state will improve Description: Ability to maintain a stable neurologic state will improve Outcome: Progressing Goal: Ability to maintain appropriate glucose levels will improve Description: Ability to maintain appropriate glucose levels will improve Outcome: Progressing Goal: Control of seizures will improve Description: Control of seizures will improve Outcome: Progressing Goal: Will show no signs and symptoms of electrolyte imbalance Description: Will show no signs and symptoms of electrolyte imbalance Outcome: Progressing

## 2024-07-11 NOTE — Care Plan (Signed)
 Problem: Knowledge Deficit Goal: Patient/family/caregiver demonstrates understanding of disease process, treatment plan, medications, and discharge instructions Description: Complete learning assessment and assess knowledge base. Outcome: Progressing   Problem: Compromised Skin Integrity Goal: Skin integrity is maintained or improved Description: Assess and monitor skin integrity. Identify patients at risk for skin breakdown on admission and per policy. Collaborate with interdisciplinary team and initiate plans and interventions as needed.    Outcome: Progressing Goal: Fluid and electrolyte balance are achieved/maintained Description: Assess and monitor vital signs (orthostatic vitals if applicable), fluid intake and output, urine color, labs, skin turgor, mucous membranes, jugular venous distention, edema, circumference of edematous extremities and abdominal girth, respiratory status, and mental status.  Monitor for signs and symptoms of hypovolemia (tachycardia, rapid breathing, decreased urine output, postural hypotension, confusion, syncope).  Monitor for signs and symptoms of hypervolemia (strong rapid pulse, shortness of breath, difficulty breathing lying down, crackles heard in lung fields, edema). Collaborate with interdisciplinary team and initiate plan and interventions as ordered. Outcome: Progressing Goal: Nutritional status is improving Description: Monitor and assess patient for malnutrition (ex- brittle hair, bruises, dry skin, pale skin and conjunctiva, muscle wasting, smooth red tongue, and disorientation). Collaborate with interdisciplinary team and initiate plan and interventions as ordered.  Monitor patient's weight and dietary intake as ordered or per policy. Utilize nutrition screening tool and intervene per policy. Determine patient's food preferences and provide high-protein, high-caloric foods as appropriate.  Outcome: Progressing   Problem: Urinary Incontinence Goal:  Perineal skin integrity is maintained or improved Description: Assess genitourinary system, perineal skin, labs (urinalysis), and history of incontinence to include past management, aggravating, and alleviating factors.  Collaborate with interdisciplinary team and initiate plans and interventions as needed. Outcome: Progressing   Problem: Health Behavior Goal: Ability to state ways to decrease the risk of falls will improve Description: Ability to state ways to decrease the risk of falls will improve Outcome: Progressing Goal: Ability to state lifestyle or environmental changes necessary to maintain safety will improve Description: Ability to state lifestyle or environmental changes necessary to maintain safety will improve Outcome: Progressing Goal: Ability to verbalize seizure precaution measures will improve Description: Ability to verbalize seizure precaution measures will improve Outcome: Progressing Goal: MCB Ability to verbalize activity precautions or restrictions will improve Description: Ability to verbalize activity precautions or restrictions will improve Outcome: Progressing Goal: Identification of resources available to assist in meeting health care needs will improve Description: Identification of resources available to assist in meeting health care needs will improve Outcome: Progressing Goal: Knowledge of diagnostic tests will improve Description: Knowledge of diagnostic tests will improve Outcome: Progressing Goal: Knowledge of disease or condition will improve - LTG Description: Knowledge of disease or condition will improve Outcome: Progressing Goal: Knowledge of the prescribed therapeutic regimen will improve Description: Knowledge of the prescribed therapeutic regimen will improve Outcome: Progressing Goal: Understanding of discharge needs will improve Description: Understanding of discharge needs will improve Outcome: Progressing Goal: Verbalization of  understanding the information provided will improve Description: Verbalization of understanding the information provided will improve Outcome: Progressing Goal: Understanding of treatment plan will improve by discharge Outcome: Progressing Goal: Compliance with treatment plan for underlying cause of condition will improve by discharge Outcome: Progressing Goal: Identification of resources available to assist in meeting health care needs will improve by discharge Outcome: Progressing Goal: Ability to care for self will improve by discharge Outcome: Progressing   Problem: Medication: Goal: Compliance with prescribed medication regimen will improve - STG Description: Compliance with prescribed medication regimen  will improve Outcome: Progressing Goal: Knowledge of medication regimen will improve Description: Knowledge of medication regimen will improve Outcome: Progressing   Problem: Health Behavior: Goal: Ability to keep healthcare appointments will improve Description: Ability to keep healthcare appointments will improve Outcome: Progressing Goal: Ability to manage health-related needs will improve Description: Ability to manage health-related needs will improve Outcome: Progressing Goal: Compliance with therapeutic regimen will improve Description: Compliance with therapeutic regimen will improve Outcome: Progressing   Problem: Neuropsychiatric: Goal: 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 will improve Description: 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 will improve Outcome: Progressing Goal: Depression symptoms will improve Description: Depression symptoms will improve Outcome: Progressing Goal: Episodes of panic and anxiety will decrease Description: Episodes of panic and anxiety will decrease Outcome: Progressing Goal: Fatigue will decrease Description: Fatigue will decrease Outcome: Progressing Goal: Sleeping patterns  will improve Description: Sleeping patterns will improve Outcome: Progressing Goal: Will be free of psychotic symptoms - STG Description: Will be free of psychotic symptoms Outcome: Progressing   Problem: Pain: Goal: Pain level will decrease Description: Pain level will decrease Outcome: Progressing Goal: Satisfaction with pain management regimen will improve Description: Satisfaction with pain management regimen will improve Outcome: Progressing   Problem: Physical Regulation: Goal: Ability to maintain a stable neurologic state will improve Description: Ability to maintain a stable neurologic state will improve Outcome: Progressing Goal: Ability to maintain appropriate glucose levels will improve Description: Ability to maintain appropriate glucose levels will improve Outcome: Progressing Goal: Control of seizures will improve Description: Control of seizures will improve Outcome: Progressing Goal: Will show no signs and symptoms of electrolyte imbalance Description: Will show no signs and symptoms of electrolyte imbalance Outcome: Progressing   Problem: Role Relationship: Goal: Ability to carry out work activities will improve Description: Ability to carry out work activities will improve Outcome: Progressing Goal: Ability to maintain and perform role responsibilities to the fullest extent possible will improve Description: Ability to maintain and perform role responsibilities to the fullest extent possible will improve Outcome: Progressing Goal: Ability to reevaluate and adapt role responsibilities will improve Description: Ability to reevaluate and adapt role responsibilities will improve Outcome: Progressing Goal: Days absent from work will decrease Description: Days absent from work will decrease Outcome: Progressing Goal: Days absent from school will decrease Description: Days absent from school will decrease Outcome: Progressing   Problem: Safety: Goal: Ability  to verbalize seizure precaution measures will improve Description: Ability to verbalize seizure precaution measures will improve Outcome: Progressing Goal: Ability to state ways to decrease the risk of falls will improve Description: Ability to state ways to decrease the risk of falls will improve Outcome: Progressing Goal: Will remain free from falls Description: Will remain free from falls Outcome: Progressing Goal: Ability to remain free from injury will improve Outcome: Progressing

## 2024-07-11 NOTE — Progress Notes (Signed)
 ------------------------------------------------------------------------------- Attestation signed by [name], MD at 07/11/2024  1:21 PM I reviewed the patient's status and agree with the plan of care as documented by the resident.   [name]  Thu 07/11/2024 1:20 PM   -------------------------------------------------------------------------------  ENT Consult Progress Note  Name: Gabriel [name] MRN: [id number] LOS: 1 days Date of Consult: 07/10/24   S: NAEON. Pt had bedside DL yesterday showing findings as below:  The tongue base, pharyngeal walls, piriform sinuses, vallecula, epiglottis and postcricoid region are normal in appearance aside from: prominent tongue swelling with copious exudative secretions. Otherwise, normal airway landmarks with no swelling. No obvious pharyngeal injuries to posterior lateral pharyngeal walls, tongue base, or vallecula. The vocal cords appear normal without swelling or lesions. The visualized portion of the subglottis and proximal trachea has limited view secondary to ETT placement.    O: Vital Signs: Vitals:   07/11/24 0700  BP: 90/63  Pulse: 63  Resp: 17  Temp: 96.9 F (36.1 C)  SpO2: 98%   Physical Exam General: -Intubated and sedated.    Head/Face: -Facial movement grossly symmetric with grimacing.  -Desquamating rash all over face, including tongue and lips.       Eyes: -PERRL -Not able to assess EOM    Ears: -Normal auricles bilaterally    Nose: -ETT in place in right nare. No gross deformity, purulent discharge, epistaxis, or rhinorrhea.    Mouth/Oropharynx: -Lips mildly edematous -Tongue with moderate to severe swelling. Able to see component of posterior oropharynx with tongue depressor. Uvula is midline without significant swelling. View is otherwise limited secondary to tongue swelling. -Floor of mouth soft.    Neck: -Trachea midline. Palpable landmarks, no pulsatility at sternal notch.  -No crepitus or  masses -C-collar in place.    Respiratory: -Audible cuff leak -Saturating appropriately on mechanical ventilation Vent Mode: Pressure regulated volume control/assist control FiO2 Set (%):  [40 %] 40 % S RR:  [18] 18 S VT:  [550 mL] 550 mL PEEP/P Low (set, cm H2O):  [5 cm H20] 5 cm H20     Cardiovascular: -Hemodynamically stable    Neurologic: -Assessment limited secondary to sedation status.    Skin: No urticaria or rashes not involving the face     Assessment: Gabriel [name] is a 48 y.o. male with a medical history significant for HIV, history of cocaine and amphetamine use, alcohol use disorder, and depression with multiple suicide attempts, with recent admission to outside hospital from 9/13-9/16 for intentional drug overdose of boric acid and multiple pills of trazodone , now admitted to MICU for delayed oropharyngeal swelling requiring emergent intubation in route to IVC. ENT was consulted for airway evaluation. Bedside DL on 0/82, showed normal appearance aside from prominent tongue swelling with copious exudative secretions. Otherwise, normal airway landmarks with no swelling. No obvious pharyngeal injuries to posterior lateral pharyngeal walls, tongue base, or vallecula.    Recommendations: - Recommend SBT this AM - fine to extubate from ENT standpoint if pt passes - If need to reintubate - see airway plan below - Continue Decadron  10mg  q8hr for 24hr total. - Given reaction seeming to be multiple days delayed from ingestion, would consider new ingestion in transport to behavioral health facility. - Remainder of care per primary team/toxicology/GI. - ENT will continue to follow. Please page ENT if patient develops airway concerns.   Airway plan: Should patient become extubated and require reintubation, recommend oral intubation with GlideScope Mac Size 3. Straightforward exposure.  Thank you for  including us  in the care of this patient.  For questions or concerns, please  secure chat message the appropriate ENT Team listed under Care Providers. Please make sure to leave a callback number.   Electronically signed by:  Lang Cordella Gay, MD 07/11/2024 7:46 AM

## 2024-07-12 LAB — CULTURE, BLOOD (ROUTINE X 2)
Culture: NO GROWTH
Culture: NO GROWTH
Special Requests: ADEQUATE

## 2024-07-12 NOTE — Progress Notes (Signed)
 HIV Coordinator/RN note:  This patient is evidently new to our hospital system.  His HIV case was diagnosed in 1999 in [place] .  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  I have obtained a copy of his insurance verification card, and that is now in the system.  His sister, who is aware of his HIV disease, is Wendy Poteet [phone])  and her information is now in EPIC as next of kin.  She does not hold legal power of attornew in any capacity.  She reports he has a long history of substance abuse and mental health problems.  He evidently lives alone in [place], and has some household pet dogs.  She is calling neighbors to see if they need intervention, and will call animal control if need be.  She was a little irritated at lack of contact from the various hospitals he has recently been in, but appreciative of [facility]'s efforts to notify her of the circumstances of his admission here.  I will follow, and assist as required.  Signe Salvia, HIV Coordinator/RN

## 2024-07-15 NOTE — Progress Notes (Signed)
 HIV Coordinator/RN note:  Patient seen as known HIV/AIDS case, new to our system.  Now extubated, conversant, and rather pleasant to chat with.  He reported he has some caring for his dogs, so that concern in ameliorated.  However, he has not been able to communicate with his job about where he is.  I am unaware of the status of any communication mehtods he might be allowed, but hopefully some allowance can be made.  I understand his sister is planning to visit today, and he is hoping for discharge.  He is aware of his ID scheduled visit at the [facility] for his next [place] treatment.  I iwll follow, and assist as required.  Signe Salvia, RN

## 2024-07-15 NOTE — Progress Notes (Signed)
   07/15/24 0915  Encounter Information  Time Initiated 0915  Time Spend (min.) 15  Encounter Type Initial  Provider Type CPE resident  Contact With Patient;Care team  Referral Source Other (Comment) (rounding)  Referral Reason Spiritual/Emotional support  Taxonomy  Intended Effects Build relationship of care and support;Demonstrate caring and concern  Methods Collaborate with care team member;Encourage story-telling;Offer emotional support;Offer spiritual/religious support  Interventions Acknowledge current situation;Ask guided questions  Plan/Recommendations/Referral  Effects of Spiritual Beliefs/Practices on Medical Care and End-of-Life Decisions None identified  Plan Services complete. Consult for further needs.
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
## 2024-07-29 NOTE — Progress Notes (Unsigned)
 HPI: Gabriel [name] is a 48 y.o. male who presents to the [facility] pharmacy clinic for [name]  administration.  Patient Active Problem List   Diagnosis Date Noted   Substance induced mood disorder (HCC) 07/09/2024   Intentional overdose (HCC) 07/06/2024   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   Cocaine use 07/06/2024   Drug overdose 07/06/2024   At risk for cardiovascular event 02/05/2024   Skin lesion 04/03/2023   Alcohol use 08/04/2022   Eustachian tube dysfunction 12/20/2019   Bilateral lower extremity pain 12/20/2019   Cough 04/30/2019   Healthcare maintenance 01/18/2019   Genital warts 06/27/2017   IVDU (intravenous drug user) 06/27/2017   Depression 06/27/2017   Hematochezia 03/26/2015   Dermatitis 06/10/2014   Dyslipidemia 04/14/2011   Blood in stool 03/20/2009   METHICILLIN RESISTANT STAPH AUREUS SEPTICEMIA 01/02/2009   Dental caries 01/02/2009   TOBACCO USER 05/27/2008   INSOMNIA, CHRONIC 05/27/2008   Human immunodeficiency virus (HIV) disease (HCC) 11/28/2006   SYPHILIS NOS 11/28/2006    Patient's Medications  New Prescriptions   No medications on file  Previous Medications   ACETAMINOPHEN  (TYLENOL ) 325 MG TABLET    Take 2 tablets (650 mg total) by mouth every 6 (six) hours as needed for mild pain (pain score 1-3), fever or headache (or Fever >/= 101).   FEXOFENADINE -PSEUDOEPHEDRINE (ALLEGRA-D) 60-120 MG 12 HR TABLET    Take 1 tablet by mouth 2 (two) times daily.   FOLIC ACID  (FOLVITE ) 1 MG TABLET    Take 1 tablet (1 mg total) by mouth daily.   HYDROXYZINE  (ATARAX ) 25 MG TABLET    Take 1 tablet (25 mg total) by mouth every 6 (six) hours as needed for anxiety (or CIWA score </= 10).   MULTIPLE VITAMIN (MULTIVITAMIN WITH MINERALS) TABS TABLET    Take 1 tablet by mouth daily.   PANTOPRAZOLE  (PROTONIX ) 40 MG TABLET    Take 1 tablet (40 mg total) by mouth daily for 14 days.   ROSUVASTATIN  (CRESTOR ) 20 MG TABLET    Take 1 tablet (20 mg total) by mouth at bedtime.   SERTRALINE   (ZOLOFT ) 50 MG TABLET    Take 1 tablet (50 mg total) by mouth daily.   THIAMINE  (VITAMIN B-1) 100 MG TABLET    Take 1 tablet (100 mg total) by mouth daily.   TRAZODONE  (DESYREL ) 50 MG TABLET    Take 1 tablet (50 mg total) by mouth at bedtime as needed for sleep.   VITAMIN D , ERGOCALCIFEROL , (DRISDOL ) 1.25 MG (50000 UNIT) CAPS CAPSULE    Take 1 capsule (50,000 Units total) by mouth every 7 (seven) days.  Modified Medications   No medications on file  Discontinued Medications   No medications on file    Allergies: Allergies  Allergen Reactions   Abacavir Other (See Comments)    HLA B5701 positive - should not receive abacavir due to risk of hypersensitivity    Labs: Lab Results  Component Value Date   HIV1RNAQUANT 27 (H) 02/05/2024   HIV1RNAQUANT 56 (H) 12/04/2023   HIV1RNAQUANT <20 (H) 05/29/2023   CD4TABS 533 02/05/2024   CD4TABS 504 05/29/2023   CD4TABS 542 04/03/2023    RPR and STI Lab Results  Component Value Date   LABRPR REACTIVE (A) 04/03/2023   LABRPR REACTIVE (A) 07/11/2022   LABRPR REACTIVE (A) 12/25/2020   LABRPR REACTIVE (A) 07/06/2020   LABRPR REACTIVE (A) 12/20/2019   RPRTITER 1:1 (H) 04/03/2023   RPRTITER 1:1 (H) 07/11/2022   RPRTITER 1:2 (H) 12/25/2020  RPRTITER 1:4 (H) 07/06/2020   RPRTITER 1:2 (H) 12/20/2019    STI Results GC CT  06/27/2017 12:00 AM Negative    Negative    Negative  Negative    Negative    Negative   12/17/2014 12:00 AM NG: Negative  CT: Negative     Hepatitis B Lab Results  Component Value Date   HEPBSAB REACTIVE (A) 01/24/2023   HEPBSAG NON-REACTIVE 01/24/2023   HEPBCAB POS (A) 11/10/2006   Hepatitis C No results found for: HEPCAB, HCVRNAPCRQN Hepatitis A Lab Results  Component Value Date   HAV REACTIVE (A) 01/24/2023   Lipids: Lab Results  Component Value Date   CHOL 202 (H) 02/05/2024   TRIG 860 (H) 02/05/2024   HDL 29 (L) 02/05/2024   CHOLHDL 7.0 (H) 02/05/2024   VLDL 53 (H) 04/24/2014   LDLCALC   02/05/2024     Comment:     . LDL cholesterol not calculated. Triglyceride levels greater than 400 mg/dL invalidate calculated LDL results. . Reference range: <100 . Desirable range <100 mg/dL for primary prevention;   <70 mg/dL for patients with CHD or diabetic patients  with > or = 2 CHD risk factors. SABRA LDL-C is now calculated using the Martin-Hopkins  calculation, which is a validated novel method providing  better accuracy than the Friedewald equation in the  estimation of LDL-C.  Gabriel [name] et al. Gabriel [name]. [id number];689(80): 2061-2068  (http://education.[employer].com/faq/FAQ164)     TARGET DATE: 9th  Assessment: Gabriel [name] presents today for his maintenance [name]  injections. Past injections were tolerated well without issues. Last HIV RNA was undetectable on 02/05/2024. Doing well with no issues today.  Administered cabotegravir  600mg /68mL in left upper outer quadrant of the gluteal muscle. Administered rilpivirine  900 mg/3mL in the right upper outer quadrant of the gluteal muscle. No issues with injections. Gabriel [name] will follow up in 2 months for next set of injections.  Eligible vaccinations: Influenza, Menveo (2nd dose), PCV20, Shingrix, Covid. Patient wishes to defer vaccines today.   Of note, patient reports recent hospitalization due to boric acid ingestion and wishes to see Gabriel [name], therefore, I have scheduled an appointment on 08/19/2024 (earliest availability at the time). Patient also mentions that during this hospital admission, they were diagnosed with Group A Streptococcus bacteremia and was asking if repeat blood cultures were needed today given that patient left AMA. Counseled patient that given the combination of repeat blood cultures from [facility] were documented as clear during 07/10/24 admission, lack of reported signs of infection (fever, chills, fatigue), and patient reporting to take oral amoxicillin  as prescribed, that repeat blood cultures were not necessary today.  Counseled patient to reach out if they begin to have any signs of infection. Patient also reports having some increased renal markers during this admission and wishes to have labs drawn to reassess renal function. Upon chart review, patient's serum creatinine appeared to numerically return back to baseline (~1), which was explained to the patient, however, BMP was ordered per patient request.   Plan: - [name]  injections administered - Next injections scheduled for 09/24/2024 with Gabriel [name], then 11/26/2024 with Alan - Scheduled office visit with Gabriel [name] for behavioral health concerns on 08/19/2024 - HIV RNA today - BMP today per patient request to assess renal function - Call with any issues or questions  Feliciano Close, PharmD PGY2 Infectious Diseases Pharmacy Resident  07/30/2024 5:00 PM

## 2024-07-30 ENCOUNTER — Ambulatory Visit: Payer: Self-pay | Admitting: Pharmacist

## 2024-07-30 ENCOUNTER — Other Ambulatory Visit: Payer: Self-pay

## 2024-07-30 ENCOUNTER — Telehealth: Payer: Self-pay | Admitting: Family

## 2024-07-30 DIAGNOSIS — F332 Major depressive disorder, recurrent severe without psychotic features: Secondary | ICD-10-CM

## 2024-07-30 DIAGNOSIS — Z79899 Other long term (current) drug therapy: Secondary | ICD-10-CM | POA: Diagnosis not present

## 2024-07-30 DIAGNOSIS — B20 Human immunodeficiency virus [HIV] disease: Secondary | ICD-10-CM

## 2024-07-30 DIAGNOSIS — Z113 Encounter for screening for infections with a predominantly sexual mode of transmission: Secondary | ICD-10-CM

## 2024-07-30 MED ORDER — CABOTEGRAVIR & RILPIVIRINE ER 600 & 900 MG/3ML IM SUER
1.0000 | Freq: Once | INTRAMUSCULAR | Status: AC
  Administered 2024-07-30: 1 via INTRAMUSCULAR

## 2024-07-30 NOTE — Telephone Encounter (Signed)
 Patient informed of referral to [facility].  Patient advised if any current suicidal thoughts he should go back to the ED.  Patient also will be requesting FMLA papers to be filled out.  Patient will have his employer fax them to our office

## 2024-07-30 NOTE — Telephone Encounter (Signed)
 Gabriel [name] requested a Behavioral Health referral to any facility in [place] as soon as possible. [name] first available is 10/29; pt hoping to be seen sooner.

## 2024-08-01 ENCOUNTER — Ambulatory Visit: Payer: Self-pay | Admitting: Pharmacist

## 2024-08-01 LAB — BASIC METABOLIC PANEL WITH GFR
BUN: 11 mg/dL (ref 7–25)
CO2: 25 mmol/L (ref 20–32)
Calcium: 9.5 mg/dL (ref 8.6–10.3)
Chloride: 108 mmol/L (ref 98–110)
Creat: 0.8 mg/dL (ref 0.60–1.29)
Glucose, Bld: 100 mg/dL — ABNORMAL HIGH (ref 65–99)
Potassium: 4.2 mmol/L (ref 3.5–5.3)
Sodium: 141 mmol/L (ref 135–146)
eGFR: 109 mL/min/1.73m2 (ref >=60)

## 2024-08-01 LAB — HIV-1 RNA QUANT-NO REFLEX-BLD
HIV 1 RNA Quant: 61 {copies}/mL — ABNORMAL HIGH
HIV-1 RNA Quant, Log: 1.79 {Log_copies}/mL — ABNORMAL HIGH

## 2024-08-01 NOTE — Progress Notes (Signed)
 Slight increase in VL - FYI as seeing you for [name]  in December.

## 2024-08-05 NOTE — Progress Notes (Signed)
 Subjective Patient ID: Gabriel [name] is a 48 y.o. male.  No chief complaint on file.  HPI  patient recently admitted for boric acid ingestion.  Has lost hair and not regrowing. No skin complaints.   The following portions of the chart were reviewed this encounter and updated as appropriate:  Tobacco   Allergies   Meds   Problems   Med Hx   Surg Hx   Fam Hx   Soc Hx     Review of Systems  Objective Physical Exam No skin lesions of face subjectively and objectively.  See media.  Assessment/Plan Wound Assessment:  Moisturize.  RTC PRN

## 2024-08-06 ENCOUNTER — Telehealth: Payer: Self-pay

## 2024-08-06 NOTE — Telephone Encounter (Signed)
 Received FMLA forms for [facility] [employer] [employer] Claims [employer]. Forms left in providers box to review and copy left in triage.  Appt 10/27 for follow up.  Lorenda CHRISTELLA Code, RMA

## 2024-08-12 ENCOUNTER — Other Ambulatory Visit (HOSPITAL_COMMUNITY): Payer: Self-pay

## 2024-08-16 ENCOUNTER — Telehealth: Payer: Self-pay

## 2024-08-16 ENCOUNTER — Other Ambulatory Visit (HOSPITAL_COMMUNITY): Payer: Self-pay

## 2024-08-16 NOTE — Telephone Encounter (Signed)
 Pharmacy Patient Advocate Encounter- [name]  BIV-Medical Benefit:  J code: [id number]  CPT code: 03627  Dx Code: B20  NO PA was through [facility] : The plan may require clinical chart notes to be submitted with claims.

## 2024-08-19 ENCOUNTER — Encounter: Payer: Self-pay | Admitting: Family

## 2024-08-19 ENCOUNTER — Other Ambulatory Visit: Payer: Self-pay

## 2024-08-19 ENCOUNTER — Emergency Department (HOSPITAL_COMMUNITY)

## 2024-08-19 ENCOUNTER — Emergency Department (HOSPITAL_COMMUNITY)
Admission: EM | Admit: 2024-08-19 | Discharge: 2024-08-19 | Disposition: A | Discharge status: Home / Self Care | Attending: Emergency Medicine | Admitting: Emergency Medicine

## 2024-08-19 ENCOUNTER — Ambulatory Visit: Admitting: Family

## 2024-08-19 ENCOUNTER — Encounter (HOSPITAL_COMMUNITY): Payer: Self-pay

## 2024-08-19 VITALS — Wt 232.0 lb

## 2024-08-19 DIAGNOSIS — F17200 Nicotine dependence, unspecified, uncomplicated: Secondary | ICD-10-CM

## 2024-08-19 DIAGNOSIS — B20 Human immunodeficiency virus [HIV] disease: Secondary | ICD-10-CM

## 2024-08-19 DIAGNOSIS — F332 Major depressive disorder, recurrent severe without psychotic features: Secondary | ICD-10-CM | POA: Diagnosis not present

## 2024-08-19 DIAGNOSIS — R911 Solitary pulmonary nodule: Secondary | ICD-10-CM | POA: Diagnosis not present

## 2024-08-19 DIAGNOSIS — Z21 Asymptomatic human immunodeficiency virus [HIV] infection status: Secondary | ICD-10-CM | POA: Insufficient documentation

## 2024-08-19 DIAGNOSIS — R918 Other nonspecific abnormal finding of lung field: Secondary | ICD-10-CM | POA: Insufficient documentation

## 2024-08-19 DIAGNOSIS — M25551 Pain in right hip: Secondary | ICD-10-CM | POA: Diagnosis present

## 2024-08-19 DIAGNOSIS — I2699 Other pulmonary embolism without acute cor pulmonale: Secondary | ICD-10-CM | POA: Insufficient documentation

## 2024-08-19 LAB — COMPREHENSIVE METABOLIC PANEL WITH GFR
ALT: 8 U/L (ref 0–44)
AST: 20 U/L (ref 15–41)
Albumin: 3.8 g/dL (ref 3.5–5.0)
Alkaline Phosphatase: 83 U/L (ref 38–126)
Anion gap: 18 — ABNORMAL HIGH (ref 5–15)
BUN: 20 mg/dL (ref 6–20)
CO2: 17 mmol/L — ABNORMAL LOW (ref 22–32)
Calcium: 9.2 mg/dL (ref 8.9–10.3)
Chloride: 106 mmol/L (ref 98–111)
Creatinine, Ser: 0.92 mg/dL (ref 0.61–1.24)
GFR, Estimated: >60 mL/min (ref >60)
Glucose, Bld: 92 mg/dL (ref 70–99)
Potassium: 4 mmol/L (ref 3.5–5.1)
Sodium: 141 mmol/L (ref 135–145)
Total Bilirubin: 0.9 mg/dL (ref 0.0–1.2)

## 2024-08-19 LAB — CBC
HCT: 44.7 % (ref 39.0–52.0)
Hemoglobin: 14.9 g/dL (ref 13.0–17.0)
MCH: 31.6 pg (ref 26.0–34.0)
MCHC: 33.3 g/dL (ref 30.0–36.0)
MCV: 94.7 fL (ref 80.0–100.0)
Platelets: 188 K/uL (ref 150–400)
RBC: 4.72 MIL/uL (ref 4.22–5.81)
RDW: 14.7 % (ref 11.5–15.5)
WBC: 5.5 K/uL (ref 4.0–10.5)
nRBC: 0 % (ref 0.0–0.2)

## 2024-08-19 LAB — MAGNESIUM: Magnesium: 2 mg/dL (ref 1.7–2.4)

## 2024-08-19 MED ORDER — OXYCODONE-ACETAMINOPHEN 5-325 MG PO TABS: 1.0000 | ORAL_TABLET | Freq: Three times a day (TID) | ORAL | 0 refills | Status: AC | PRN

## 2024-08-19 MED ORDER — KETOROLAC TROMETHAMINE 15 MG/ML IJ SOLN
15.0000 mg | Freq: Once | INTRAMUSCULAR | Status: AC
  Administered 2024-08-19: 15 mg via INTRAVENOUS
  Filled 2024-08-19: qty 1

## 2024-08-19 MED ORDER — KETOROLAC TROMETHAMINE 60 MG/2ML IM SOLN: 30.0000 mg | Freq: Once | INTRAMUSCULAR | Status: DC

## 2024-08-19 MED ORDER — METHOCARBAMOL 500 MG PO TABS
1000.0000 mg | ORAL_TABLET | Freq: Once | ORAL | Status: AC
  Administered 2024-08-19: 1000 mg via ORAL
  Filled 2024-08-19: qty 2

## 2024-08-19 MED ORDER — IOHEXOL 350 MG/ML SOLN
75.0000 mL | Freq: Once | INTRAVENOUS | Status: AC | PRN
  Administered 2024-08-19: 75 mL via INTRAVENOUS

## 2024-08-19 MED ORDER — METHOCARBAMOL 500 MG PO TABS: 1000.0000 mg | ORAL_TABLET | Freq: Three times a day (TID) | ORAL | 0 refills | Status: AC | PRN

## 2024-08-19 MED ORDER — OXYCODONE-ACETAMINOPHEN 5-325 MG PO TABS
1.0000 | ORAL_TABLET | Freq: Once | ORAL | Status: AC
  Administered 2024-08-19: 1 via ORAL
  Filled 2024-08-19: qty 1

## 2024-08-19 MED ORDER — APIXABAN (ELIQUIS) VTE STARTER PACK (10MG AND 5MG): ORAL_TABLET | ORAL | 0 refills | Status: AC
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
## 2024-08-19 NOTE — Assessment & Plan Note (Signed)
 Continues to smoke daily. Counseled on the dangers of tobacco not ready to quit at this time.  Reviewed strategies to maximize success, including removing cigarettes and smoking materials from environment, stress management, substitution of other forms of reinforcement, support of family/friends, and written materials.

## 2024-08-19 NOTE — Assessment & Plan Note (Signed)
 Continues to have depression with current dose of sertraline  with no further suicidal ideation and has no signs of psychosis. Affect currently appears incongruent with mood. He has good insight and has remained sober since leaving the hospital. Has appointment with counseling on Wednesday and referral previously placed to psychiatry. Will await counseling appointment and consider increasing current dose of sertraline . Continue trazodone  as needed for sleep. Plan for follow up in 1 month or sooner if needed.
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
## 2024-08-19 NOTE — Assessment & Plan Note (Signed)
 Gabriel [name] continues to have well controlled virus with good adherence and tolerance to [name]  .  Reviewed lab work and discussed plan of care, U equals U, and family planning. Social determinants of health reviewed and no interventions indicated. Check lab work. Continue current dose of [name]  . Plan for follow up in  1 month or sooner if needed with lab work on the same day.SABRA

## 2024-08-19 NOTE — Assessment & Plan Note (Signed)
 On apixiban for anticoagulation started in the ED. Reviewed bleeding precautions.

## 2024-08-19 NOTE — Discharge Instructions (Signed)
 Your imaging today did not show any major injuries.  There was an incidental finding of the following: Suspicious spiculated nodule at the right lung apex, suspicious  for bronchogenic carcinoma  You should get this imaged with a CT scan.  Talk to primary care doctor about this.  A prescription for muscle relaxer was sent to your pharmacy.  Take this as needed.  Take over-the-counter ibuprofen  and Tylenol  as well for pain and soreness.  If you do have persistent right hip symptoms next week, call the telephone number below to follow-up with an orthopedic doctor.  Return to the emergency department for any new or worsening symptoms of concern.

## 2024-08-19 NOTE — Assessment & Plan Note (Signed)
 Mr. Foerster his a right sided lung mass that was initially concerning for possible infection, however has not resolved with most recent imaging now concerning for possible malignancy. Discussed CT results and next step would be referral to Pulmonology for further evaluation.
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
## 2024-08-19 NOTE — Patient Instructions (Signed)
 Nice to see you.  Continue to take your medication daily as prescribed.  Counseling appointment on Wednesday.   Pulmonology referral sent. Please be on the lookout for phone call.   Plan for follow up in 1 months or sooner if needed with lab work on the same day.  Have a great day and stay safe!

## 2024-08-21 ENCOUNTER — Other Ambulatory Visit: Payer: Self-pay

## 2024-08-21 ENCOUNTER — Ambulatory Visit: Admitting: Licensed Clinical Social Worker

## 2024-08-21 DIAGNOSIS — F332 Major depressive disorder, recurrent severe without psychotic features: Secondary | ICD-10-CM

## 2024-08-21 NOTE — Telephone Encounter (Signed)
 Forms completed by provider. Will fax to [phone] Copy left in scan folder and in triage.  Lorenda CHRISTELLA Code, RMA

## 2024-08-21 NOTE — Progress Notes (Signed)
   THERAPIST PROGRESS NOTE  Session Time: 15  Participation Level: Active  Behavioral Response: Well Groomed, Alert, Euthymic  Type of Therapy: Individual Therapy  Treatment Goals addressed: Client presented to session wanting to get connected with psychiatry and counseling services.   ProgressTowards Goals: Initial  Interventions: Motivational Interviewing  Summary: ROMAR WOODRICK is a 48 y.o. male who presents with symptoms of depression.   Suicidal/Homicidal: denied SI/HI/AVH  Therapist Response: Client presented to session wanting to get connected to counseling and psychiatry services. Client reports a referral was sent in by his provider but he has not been contacted yet. Therapist and client discussed resources and referrals. Therapist informed client that due to his insurance, therapist could not see him here. Client was understanding. Therapist provided client with resources and referrals for counseling and psychiatry that are able to accept his insurance.   Plan: Therapist provided client with resources and referrals for counseling and psychiatry that are able to accept his insurance.   Diagnosis: MDD  Collaboration of Care: will coordinate with [facility]  Patient was advised Release of Information must be obtained prior to any record release in order to collaborate their care with an outside provider. Patient was advised if they have not already done so to contact the registration department to sign all necessary forms in order for us  to release information regarding their care.   Consent: Patient gives verbal consent for treatment and assignment of benefits for services provided during this visit. Patient expressed understanding and agreed to proceed.   Dejanee Thibeaux [place], [place] 08/21/2024

## 2024-08-28 ENCOUNTER — Encounter: Payer: Self-pay | Admitting: Pulmonary Disease

## 2024-08-28 ENCOUNTER — Ambulatory Visit (INDEPENDENT_AMBULATORY_CARE_PROVIDER_SITE_OTHER): Admitting: Pulmonary Disease

## 2024-08-28 VITALS — BP 132/82 | HR 93 | Temp 98.1°F | Ht 78.0 in | Wt 232.2 lb

## 2024-08-28 DIAGNOSIS — Z86711 Personal history of pulmonary embolism: Secondary | ICD-10-CM | POA: Diagnosis not present

## 2024-08-28 DIAGNOSIS — R918 Other nonspecific abnormal finding of lung field: Secondary | ICD-10-CM

## 2024-08-28 DIAGNOSIS — Z21 Asymptomatic human immunodeficiency virus [HIV] infection status: Secondary | ICD-10-CM | POA: Diagnosis not present

## 2024-08-28 DIAGNOSIS — F17210 Nicotine dependence, cigarettes, uncomplicated: Secondary | ICD-10-CM | POA: Diagnosis not present
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
## 2024-08-29 ENCOUNTER — Telehealth: Payer: Self-pay

## 2024-08-29 NOTE — Telephone Encounter (Signed)
 Robotic Bronch with EBUS 09/03/2024 at 2:00pm Lung nodule [id number], [id number], [id number]  Gabriel [name] please see bronch info  Patient is aware and bronch email has been sent.

## 2024-08-29 NOTE — Telephone Encounter (Signed)
 For the codes 68372, [id number], [phone] Prior Auth Not Required Refer # Gabriel [name]. 08/29/24 2:19pm CST

## 2024-08-29 NOTE — Telephone Encounter (Signed)
 Noted. Nothing further needed.

## 2024-09-02 ENCOUNTER — Encounter
Admission: RE | Admit: 2024-09-02 | Discharge: 2024-09-02 | Disposition: A | Discharge status: Home / Self Care | Source: Ambulatory Visit | Attending: Pulmonary Disease | Admitting: Pulmonary Disease

## 2024-09-02 ENCOUNTER — Ambulatory Visit
Admission: RE | Admit: 2024-09-02 | Discharge: 2024-09-02 | Disposition: A | Discharge status: Home / Self Care | Source: Ambulatory Visit | Attending: Pulmonary Disease | Admitting: Pulmonary Disease

## 2024-09-02 ENCOUNTER — Other Ambulatory Visit: Payer: Self-pay

## 2024-09-02 DIAGNOSIS — R918 Other nonspecific abnormal finding of lung field: Secondary | ICD-10-CM | POA: Insufficient documentation

## 2024-09-02 HISTORY — DX: Other psychoactive substance use, unspecified, uncomplicated: F19.90

## 2024-09-02 HISTORY — DX: Anemia, unspecified: D64.9

## 2024-09-02 HISTORY — DX: Gastro-esophageal reflux disease without esophagitis: K21.9

## 2024-09-02 HISTORY — DX: Pain in right leg: M79.604

## 2024-09-02 HISTORY — DX: Depression, unspecified: F32.A

## 2024-09-02 HISTORY — DX: Melena: K92.1

## 2024-09-02 HISTORY — DX: Tobacco use: Z72.0

## 2024-09-02 LAB — GLUCOSE, CAPILLARY: Glucose-Capillary: 113 mg/dL — ABNORMAL HIGH (ref 70–99)

## 2024-09-02 MED ORDER — FLUDEOXYGLUCOSE F - 18 (FDG) INJECTION
12.0000 | Freq: Once | INTRAVENOUS | Status: AC | PRN
  Administered 2024-09-02: 11.6 via INTRAVENOUS

## 2024-09-02 NOTE — Patient Instructions (Addendum)
 Your procedure is scheduled on:  Tuesday 09/03/24  Report to the Registration Desk on the 1st floor of the [facility]. Arrive at 1 pm   REMEMBER: Instructions that are not followed completely may result in serious medical risk, up to and including death; or upon the discretion of your surgeon and anesthesiologist your surgery may need to be rescheduled.  Do not eat food after midnight the night before surgery.  No gum chewing or hard candies.   One week prior to surgery: Stop Anti-inflammatories (NSAIDS) such as Advil , Aleve, Ibuprofen , Motrin , Naproxen, Naprosyn and Aspirin  based products such as Excedrin, Goody's Powder, BC Powder. Stop ANY OVER THE COUNTER supplements until after surgery.  You may however, continue to take Tylenol  if needed for pain up until the day of surgery.  Stop APIXABAN (ELIQUIS) 2 days prior to surgery (take last dose  08/31/24)  Continue taking all of your other prescription medications up until the day of surgery.  ON THE DAY OF SURGERY ONLY TAKE THESE MEDICATIONS WITH SIPS OF WATER:  sertraline  (ZOLOFT ) 50 MG  hydrOXYzine  (ATARAX ) 25 M    No Alcohol for 24 hours before or after surgery.  No Smoking including e-cigarettes for 24 hours before surgery.  No chewable tobacco products for at least 6 hours before surgery.  No nicotine  patches on the day of surgery.  Do not use any recreational drugs for at least a week (preferably 2 weeks) before your surgery.  Please be advised that the combination of cocaine and anesthesia may have negative outcomes, up to and including death. If you test positive for cocaine, your surgery will be cancelled.  On the morning of surgery brush your teeth with toothpaste and water, you may rinse your mouth with mouthwash if you wish. Do not swallow any toothpaste or mouthwash.  Use CHG Soap or wipes as directed on instruction sheet.  Do not wear jewelry, make-up, hairpins, clips or nail polish.  For welded  (permanent) jewelry: bracelets, anklets, waist bands, etc.  Please have this removed prior to surgery.  If it is not removed, there is a chance that hospital personnel will need to cut it off on the day of surgery.  Do not wear lotions, powders, or perfumes.   Do not shave body hair from the neck down 48 hours before surgery.  Contact lenses, hearing aids and dentures may not be worn into surgery.  Do not bring valuables to the hospital. [facility] is not responsible for any missing/lost belongings or valuables.   Bring your C-PAP to the hospital in case you may have to spend the night.   Notify your doctor if there is any change in your medical condition (cold, fever, infection).  Wear comfortable clothing (specific to your surgery type) to the hospital.  After surgery, you can help prevent lung complications by doing breathing exercises.  Take deep breaths and cough every 1-2 hours. Your doctor may order a device called an Incentive Spirometer to help you take deep breaths. When coughing or sneezing, hold a pillow firmly against your incision with both hands. This is called "splinting." Doing this helps protect your incision. It also decreases belly discomfort.  If you are being admitted to the hospital overnight, leave your suitcase in the car. After surgery it may be brought to your room.  In case of increased patient census, it may be necessary for you, the patient, to continue your postoperative care in the Same Day Surgery department.  If you are being  discharged the day of surgery, you will not be allowed to drive home. You will need a responsible individual to drive you home and stay with you for 24 hours after surgery.   If you are taking public transportation, you will need to have a responsible individual with you.  Please call the Pre-admissions Testing Dept. at [phone] if you have any questions about these instructions.  Surgery Visitation Policy:  Patients  having surgery or a procedure may have two visitors.  Children under the age of 31 must have an adult with them who is not the patient.  Inpatient Visitation:    Visiting hours are 7 a.m. to 8 p.m. Up to four visitors are allowed at one time in a patient room. The visitors may rotate out with other people during the day.  One visitor age 89 or older may stay with the patient overnight and must be in the room by 8 p.m.   Merchandiser, Retail to address health-related social needs:  [email or web address]

## 2024-09-03 ENCOUNTER — Other Ambulatory Visit: Payer: Self-pay

## 2024-09-03 ENCOUNTER — Ambulatory Visit

## 2024-09-03 ENCOUNTER — Encounter
Admission: RE | Disposition: A | Discharge status: Home / Self Care | Payer: Self-pay | Source: Home / Self Care | Attending: Pulmonary Disease

## 2024-09-03 ENCOUNTER — Ambulatory Visit
Admission: RE | Admit: 2024-09-03 | Discharge: 2024-09-03 | Disposition: A | Discharge status: Home / Self Care | Attending: Pulmonary Disease | Admitting: Pulmonary Disease

## 2024-09-03 ENCOUNTER — Encounter: Payer: Self-pay | Admitting: Pulmonary Disease

## 2024-09-03 DIAGNOSIS — Z7901 Long term (current) use of anticoagulants: Secondary | ICD-10-CM | POA: Diagnosis not present

## 2024-09-03 DIAGNOSIS — R918 Other nonspecific abnormal finding of lung field: Secondary | ICD-10-CM | POA: Diagnosis present

## 2024-09-03 DIAGNOSIS — Z86711 Personal history of pulmonary embolism: Secondary | ICD-10-CM | POA: Insufficient documentation

## 2024-09-03 DIAGNOSIS — Z87891 Personal history of nicotine dependence: Secondary | ICD-10-CM | POA: Diagnosis not present

## 2024-09-03 DIAGNOSIS — F32A Depression, unspecified: Secondary | ICD-10-CM | POA: Insufficient documentation

## 2024-09-03 HISTORY — PX: VIDEO BRONCHOSCOPY WITH ENDOBRONCHIAL ULTRASOUND: SHX6177

## 2024-09-03 HISTORY — PX: ENDOBRONCHIAL ULTRASOUND: SHX5096

## 2024-09-03 HISTORY — PX: VIDEO BRONCHOSCOPY WITH ENDOBRONCHIAL NAVIGATION: SHX6175

## 2024-09-03 LAB — BODY FLUID CELL COUNT WITH DIFFERENTIAL
Eos, Fluid: 6 %
Lymphs, Fluid: 45 %
Monocyte-Macrophage-Serous Fluid: 18 % — ABNORMAL LOW (ref 50–90)
Neutrophil Count, Fluid: 31 % — ABNORMAL HIGH (ref 0–25)
Total Nucleated Cell Count, Fluid: 6 uL (ref 0–1000)

## 2024-09-03 SURGERY — VIDEO BRONCHOSCOPY WITH ENDOBRONCHIAL NAVIGATION
Anesthesia: General | Laterality: Right

## 2024-09-03 MED ORDER — CHLORHEXIDINE GLUCONATE 0.12 % MT SOLN
15.0000 mL | Freq: Once | OROMUCOSAL | Status: AC
  Administered 2024-09-03: 15 mL via OROMUCOSAL

## 2024-09-03 MED ORDER — MIDAZOLAM HCL 2 MG/2ML IJ SOLN
INTRAMUSCULAR | Status: AC
  Filled 2024-09-03: qty 2

## 2024-09-03 MED ORDER — PROPOFOL 10 MG/ML IV BOLUS
INTRAVENOUS | Status: AC
  Filled 2024-09-03: qty 20

## 2024-09-03 MED ORDER — EPHEDRINE SULFATE (PRESSORS) 25 MG/5ML IV SOSY
PREFILLED_SYRINGE | INTRAVENOUS | Status: DC | PRN
  Administered 2024-09-03 (×3): 5 mg via INTRAVENOUS

## 2024-09-03 MED ORDER — PROPOFOL 10 MG/ML IV BOLUS
INTRAVENOUS | Status: DC | PRN
  Administered 2024-09-03: 200 mg via INTRAVENOUS
  Administered 2024-09-03: 30 mg via INTRAVENOUS

## 2024-09-03 MED ORDER — PROPOFOL 1000 MG/100ML IV EMUL
INTRAVENOUS | Status: AC
  Filled 2024-09-03: qty 100

## 2024-09-03 MED ORDER — GLYCOPYRROLATE 0.2 MG/ML IJ SOLN
INTRAMUSCULAR | Status: DC | PRN
  Administered 2024-09-03: .2 mg via INTRAVENOUS

## 2024-09-03 MED ORDER — SUCCINYLCHOLINE CHLORIDE 200 MG/10ML IV SOSY
PREFILLED_SYRINGE | INTRAVENOUS | Status: AC
  Filled 2024-09-03: qty 10

## 2024-09-03 MED ORDER — LACTATED RINGERS IV SOLN: INTRAVENOUS | Status: DC

## 2024-09-03 MED ORDER — DEXAMETHASONE SOD PHOSPHATE PF 10 MG/ML IJ SOLN
INTRAMUSCULAR | Status: DC | PRN
  Administered 2024-09-03: 10 mg via INTRAVENOUS

## 2024-09-03 MED ORDER — ORAL CARE MOUTH RINSE: 15.0000 mL | Freq: Once | OROMUCOSAL | Status: AC

## 2024-09-03 MED ORDER — ROCURONIUM BROMIDE 100 MG/10ML IV SOLN
INTRAVENOUS | Status: DC | PRN
  Administered 2024-09-03: 30 mg via INTRAVENOUS
  Administered 2024-09-03: 70 mg via INTRAVENOUS

## 2024-09-03 MED ORDER — FENTANYL CITRATE (PF) 100 MCG/2ML IJ SOLN
INTRAMUSCULAR | Status: DC | PRN
  Administered 2024-09-03 (×2): 50 ug via INTRAVENOUS

## 2024-09-03 MED ORDER — PROPOFOL 500 MG/50ML IV EMUL
INTRAVENOUS | Status: DC | PRN
  Administered 2024-09-03: 125 ug/kg/min via INTRAVENOUS

## 2024-09-03 MED ORDER — CHLORHEXIDINE GLUCONATE 0.12 % MT SOLN
OROMUCOSAL | Status: AC
  Filled 2024-09-03: qty 15

## 2024-09-03 MED ORDER — FENTANYL CITRATE (PF) 100 MCG/2ML IJ SOLN
INTRAMUSCULAR | Status: AC
  Filled 2024-09-03: qty 2

## 2024-09-03 MED ORDER — PHENYLEPHRINE 80 MCG/ML (10ML) SYRINGE FOR IV PUSH (FOR BLOOD PRESSURE SUPPORT)
PREFILLED_SYRINGE | INTRAVENOUS | Status: DC | PRN
Start: 2024-09-03 — End: 2024-09-03
  Administered 2024-09-03 (×4): 80 ug via INTRAVENOUS

## 2024-09-03 MED ORDER — SUGAMMADEX SODIUM 200 MG/2ML IV SOLN
INTRAVENOUS | Status: DC | PRN
  Administered 2024-09-03: 200 mg via INTRAVENOUS

## 2024-09-03 MED ORDER — LIDOCAINE HCL (CARDIAC) PF 100 MG/5ML IV SOSY
PREFILLED_SYRINGE | INTRAVENOUS | Status: DC | PRN
  Administered 2024-09-03: 100 mg via INTRAVENOUS

## 2024-09-03 MED ORDER — ONDANSETRON HCL 4 MG/2ML IJ SOLN
INTRAMUSCULAR | Status: DC | PRN
Start: 2024-09-03 — End: 2024-09-03
  Administered 2024-09-03: 4 mg via INTRAVENOUS

## 2024-09-03 MED ORDER — LIDOCAINE HCL (PF) 2 % IJ SOLN
INTRAMUSCULAR | Status: AC
  Filled 2024-09-03: qty 5

## 2024-09-03 MED ORDER — MIDAZOLAM HCL (PF) 2 MG/2ML IJ SOLN
INTRAMUSCULAR | Status: DC | PRN
Start: 2024-09-03 — End: 2024-09-03
  Administered 2024-09-03: 2 mg via INTRAVENOUS

## 2024-09-03 MED ORDER — ROCURONIUM BROMIDE 10 MG/ML (PF) SYRINGE
PREFILLED_SYRINGE | INTRAVENOUS | Status: AC
  Filled 2024-09-03: qty 10

## 2024-09-03 MED ORDER — DEXMEDETOMIDINE HCL IN NACL 80 MCG/20ML IV SOLN
INTRAVENOUS | Status: DC | PRN
  Administered 2024-09-03: 4 ug via INTRAVENOUS
  Administered 2024-09-03: 8 ug via INTRAVENOUS
  Administered 2024-09-03 (×2): 4 ug via INTRAVENOUS

## 2024-09-03 NOTE — Anesthesia Procedure Notes (Signed)
 Procedure Name: Intubation Date/Time: 09/03/2024 2:56 PM  Performed by: Clementine Soulliere, CRNAPre-anesthesia Checklist: Patient identified, Emergency Drugs available, Suction available and Patient being monitored Patient Re-evaluated:Patient Re-evaluated prior to induction Oxygen Delivery Method: Circle System Utilized Preoxygenation: Pre-oxygenation with 100% oxygen Induction Type: IV induction Ventilation: Mask ventilation without difficulty Laryngoscope Size: McGrath and 4 Grade View: Grade I Tube type: Oral Tube size: 9.0 mm Number of attempts: 1 Airway Equipment and Method: Stylet and Oral airway Placement Confirmation: ETT inserted through vocal cords under direct vision, positive ETCO2 and breath sounds checked- equal and bilateral Secured at: 24 cm Tube secured with: Tape Dental Injury: Teeth and Oropharynx as per pre-operative assessment  Comments: Head and neck midline. Eyes, nose, mouth, chin and ears free from external pressure. Lips and tongue unchanged.

## 2024-09-03 NOTE — Op Note (Addendum)
 Video Bronchoscopy with Robotic Assisted Bronchoscopic Navigation   Date of Operation: 09/03/2024   Pre-op Diagnosis: RUL Mass  Post-op Diagnosis: RUL Mass   Surgeon: Nikola Marone  Anesthesia: General endotracheal anesthesia  Operation: Flexible video fiberoptic bronchoscopy with robotic assistance and biopsies.  Estimated Blood Loss: Minimal  Complications: None  Indications and History: Gabriel [name] is a 48 y.o. male with history of RUL Mass and an adjacent nodule.  Recommendation made to achieve a tissue diagnosis via robotic assisted navigational bronchoscopy.  The risks, benefits, complications, treatment options and expected outcomes were discussed with the patient.  The possibilities of pneumothorax, pneumonia, reaction to medication, pulmonary aspiration, perforation of a viscus, bleeding, failure to diagnose a condition and creating a complication requiring transfusion or operation were discussed with the patient who freely signed the consent.    Description of Procedure: The patient was seen in the Preoperative Area, was examined and was deemed appropriate to proceed.  The patient was taken to [facility] OR, identified as Gabriel [name] and the procedure verified as Flexible Video Fiberoptic Bronchoscopy.  A Time Out was held and the above information confirmed.   Prior to the date of the procedure a high-resolution CT scan of the chest was performed. Utilizing ION software program a virtual tracheobronchial tree was generated to allow the creation of distinct navigation pathways to the patient's parenchymal abnormalities. After being taken to the operating room general anesthesia was initiated and the patient  was orally intubated. The video fiberoptic bronchoscope was introduced via the endotracheal tube and a general inspection was performed which showed normal right and left lung anatomy. Aspiration of the bilateral mainstems was completed to remove any remaining secretions. Robotic  catheter inserted into patient's endotracheal tube.   Target #1 RUL Apical Nodule: The distinct navigation pathways prepared prior to this procedure were then utilized to navigate to patient's lesion identified on CT scan. The robotic catheter was secured into place and the vision probe was withdrawn.  Lesion location was approximated using fluoroscopy.  Local registration and targeting was performed using GE mobile C-arm three-dimensional imaging and REBUS. Under fluoroscopic guidance transbronchial transbronchial needle biopsies, and transbronchial forceps biopsies were performed to be sent for cytology and pathology.  Needle-in-lesion was confirmed using GE mobile C-arm.    Target #2 RUL Main Mass: The distinct navigation pathways prepared prior to this procedure were then utilized to navigate to patient's lesion identified on CT scan. The robotic catheter was secured into place and the vision probe was withdrawn.  Lesion location was approximated using fluoroscopy.  Local registration and targeting was performed using GE mobile C-arm three-dimensional imaging and REBUS. Under fluoroscopic guidance transbronchial needle biopsies, and transbronchial forceps biopsies were performed to be sent for cytology and pathology. A bronchioalveolar lavage was performed in the RUL an sent for microbiology.   We then proceeded with EBUS TBNA of Stations 4L,7 and 11RS. 4R was evaluated but was small with presence of central hyaline structure.   At the end of the procedure a general airway inspection was performed and there was no evidence of active bleeding. The bronchoscope was removed.  The patient tolerated the procedure well. There was no significant blood loss and there were no obvious complications. A post-procedural chest x-ray is pending.  Samples Target #1: 1. Transbronchial needle biopsies from RUL Apical Nodule 2. Transbronchial forceps biopsies from RUL Apical Nodule  Samples Target #2: 1.  Transbronchial needle Biopsies from RUL Main Mass 2. Transbronchial forceps biopsies from RUL Main  Mass 3. Bronchoalveolar lavage from RUL for microbiology.   Plans:  The patient will be discharged from the PACU to home when recovered from anesthesia and after chest x-ray is reviewed. We will review the cytology, pathology and microbiology results with the patient when they become available. Outpatient followup will be with Meela Wareing.  Gabriel Barn, MD [facility] 09/03/2024 4:48 PM

## 2024-09-03 NOTE — Interval H&P Note (Signed)
 History and Physical Interval Note:  09/03/2024 2:37 PM  Gabriel [name]  has presented today for surgery, with the diagnosis of Lung Mass.  The various methods of treatment have been discussed with the patient and family. After consideration of risks, benefits and other options for treatment, the patient has consented to  Procedure(s): VIDEO BRONCHOSCOPY WITH ENDOBRONCHIAL NAVIGATION (Right) BRONCHOSCOPY, WITH EBUS (Right) ENDOBRONCHIAL ULTRASOUND (EBUS) (Bilateral) as a surgical intervention.  The patient's history has been reviewed, patient examined, no change in status, stable for surgery.  I have reviewed the patient's chart and labs.  Questions were answered to the patient's satisfaction.     Jean-Pierre Quinnley Colasurdo

## 2024-09-03 NOTE — Anesthesia Postprocedure Evaluation (Signed)
 Anesthesia Post Note  Patient: Gabriel [name]  Procedure(s) Performed: VIDEO BRONCHOSCOPY WITH ENDOBRONCHIAL NAVIGATION (Right) BRONCHOSCOPY, WITH EBUS (Right) ENDOBRONCHIAL ULTRASOUND (EBUS) (Bilateral)  Patient location during evaluation: PACU Anesthesia Type: General Level of consciousness: awake and alert Pain management: pain level controlled Vital Signs Assessment: post-procedure vital signs reviewed and stable Respiratory status: spontaneous breathing, nonlabored ventilation and respiratory function stable Cardiovascular status: blood pressure returned to baseline and stable Postop Assessment: no apparent nausea or vomiting Anesthetic complications: no   No notable events documented.   Last Vitals:  Vitals:   09/03/24 1700 09/03/24 1712  BP: 110/87 110/87  Pulse: 86 89  Resp: 20 20  Temp:  (!) 36.4 C  SpO2: 97% 98%    Last Pain:  Vitals:   09/03/24 1712  TempSrc: Temporal  PainSc: 0-No pain                 Camellia Merilee Louder
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
## 2024-09-03 NOTE — Transfer of Care (Signed)
 Immediate Anesthesia Transfer of Care Note  Patient: Gabriel [name]  Procedure(s) Performed: VIDEO BRONCHOSCOPY WITH ENDOBRONCHIAL NAVIGATION (Right) BRONCHOSCOPY, WITH EBUS (Right) ENDOBRONCHIAL ULTRASOUND (EBUS) (Bilateral)  Patient Location: PACU  Anesthesia Type:General  Level of Consciousness: drowsy  Airway & Oxygen Therapy: Patient connected to nasal cannula oxygen  Post-op Assessment: Report given to RN and Post -op Vital signs reviewed and stable  Post vital signs: Reviewed and stable  Last Vitals:  Vitals Value Taken Time  BP 119/89 09/03/24 16:30  Temp    Pulse 95 09/03/24 16:31  Resp 20 09/03/24 16:31  SpO2 98 % 09/03/24 16:31  Vitals shown include unfiled device data.  Last Pain:  Vitals:   09/03/24 1305  TempSrc: Temporal  PainSc: 0-No pain         Complications: No notable events documented.

## 2024-09-03 NOTE — Op Note (Signed)
 Tool-in-lesion RUL Apical Nodule   Darrin Barn, MD [facility] 09/03/2024 4:50 PM

## 2024-09-04 ENCOUNTER — Encounter: Payer: Self-pay | Admitting: Pulmonary Disease

## 2024-09-04 LAB — ACID FAST SMEAR (AFB, MYCOBACTERIA): Acid Fast Smear: NEGATIVE

## 2024-09-05 ENCOUNTER — Encounter: Payer: Self-pay | Admitting: Pulmonary Disease

## 2024-09-06 LAB — SURGICAL PATHOLOGY

## 2024-09-06 LAB — CULTURE, RESPIRATORY W GRAM STAIN
Culture: NO GROWTH
Gram Stain: NONE SEEN

## 2024-09-09 ENCOUNTER — Telehealth: Payer: Self-pay | Admitting: Pulmonary Disease

## 2024-09-09 DIAGNOSIS — R918 Other nonspecific abnormal finding of lung field: Secondary | ICD-10-CM

## 2024-09-09 LAB — CYTOLOGY - NON PAP

## 2024-09-09 MED ORDER — NICOTINE 14 MG/24HR TD PT24: 14.0000 mg | MEDICATED_PATCH | Freq: Every day | TRANSDERMAL | 6 refills | Status: DC

## 2024-09-09 NOTE — Telephone Encounter (Signed)
 Called Mr. 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 The Main mass shows atypical cells. 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 Placed referral to both Oncology and thoracic surgery.   Darrin Barn, MD [facility] 09/09/2024 11:35 AM

## 2024-09-10 ENCOUNTER — Encounter: Payer: Self-pay | Admitting: Oncology

## 2024-09-10 ENCOUNTER — Inpatient Hospital Stay: Attending: Oncology | Admitting: Oncology

## 2024-09-10 ENCOUNTER — Inpatient Hospital Stay

## 2024-09-10 VITALS — BP 129/90 | HR 89 | Temp 97.9°F | Resp 18 | Ht 78.0 in | Wt 235.0 lb

## 2024-09-10 DIAGNOSIS — C3411 Malignant neoplasm of upper lobe, right bronchus or lung: Secondary | ICD-10-CM | POA: Diagnosis not present
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
## 2024-09-11 ENCOUNTER — Encounter: Payer: Self-pay | Admitting: *Deleted

## 2024-09-11 NOTE — Telephone Encounter (Signed)
Form has been placed in your folder.

## 2024-09-12 ENCOUNTER — Other Ambulatory Visit: Payer: Self-pay

## 2024-09-12 DIAGNOSIS — C3411 Malignant neoplasm of upper lobe, right bronchus or lung: Secondary | ICD-10-CM | POA: Insufficient documentation

## 2024-09-12 NOTE — Progress Notes (Signed)
 The proposed treatment discussed in conference is for discussion purpose only and is not a binding recommendation.  The patients have not been physically examined, or presented with their treatment options.  Therefore, final treatment plans cannot be decided.

## 2024-09-16 ENCOUNTER — Ambulatory Visit: Admitting: Thoracic Surgery (Cardiothoracic Vascular Surgery)

## 2024-09-16 ENCOUNTER — Encounter: Admitting: Thoracic Surgery (Cardiothoracic Vascular Surgery)

## 2024-09-24 ENCOUNTER — Encounter: Payer: Self-pay | Admitting: Family

## 2024-09-24 ENCOUNTER — Ambulatory Visit: Payer: Self-pay | Admitting: Family

## 2024-09-24 ENCOUNTER — Other Ambulatory Visit: Payer: Self-pay

## 2024-09-24 VITALS — BP 107/71 | HR 109 | Temp 98.5°F | Resp 16 | Wt 245.0 lb

## 2024-09-24 DIAGNOSIS — F17210 Nicotine dependence, cigarettes, uncomplicated: Secondary | ICD-10-CM | POA: Insufficient documentation

## 2024-09-24 DIAGNOSIS — F332 Major depressive disorder, recurrent severe without psychotic features: Secondary | ICD-10-CM

## 2024-09-24 DIAGNOSIS — B20 Human immunodeficiency virus [HIV] disease: Secondary | ICD-10-CM | POA: Diagnosis not present

## 2024-09-24 DIAGNOSIS — Z23 Encounter for immunization: Secondary | ICD-10-CM | POA: Diagnosis not present

## 2024-09-24 DIAGNOSIS — Z79899 Other long term (current) drug therapy: Secondary | ICD-10-CM

## 2024-09-24 DIAGNOSIS — Z0000 Encounter for general adult medical examination without abnormal findings: Secondary | ICD-10-CM

## 2024-09-24 MED ORDER — CABOTEGRAVIR & RILPIVIRINE ER 600 & 900 MG/3ML IM SUER
1.0000 | Freq: Once | INTRAMUSCULAR | Status: AC
  Administered 2024-09-24: 1 via INTRAMUSCULAR

## 2024-09-24 MED ORDER — TRAZODONE HCL 50 MG PO TABS: 50.0000 mg | ORAL_TABLET | Freq: Every evening | ORAL | 5 refills | Status: AC | PRN

## 2024-09-24 MED ORDER — SERTRALINE HCL 50 MG PO TABS: 50.0000 mg | ORAL_TABLET | Freq: Every day | ORAL | 5 refills | Status: AC

## 2024-09-24 MED ORDER — ROSUVASTATIN CALCIUM 20 MG PO TABS: 20.0000 mg | ORAL_TABLET | Freq: Every day | ORAL | 5 refills | Status: AC

## 2024-09-24 MED ORDER — BUPROPION HCL ER (SR) 150 MG PO TB12: ORAL_TABLET | ORAL | 2 refills | Status: AC

## 2024-09-24 NOTE — Patient Instructions (Addendum)
 Nice to see you.  We will check your lab work today.  Continue to take your medication daily as prescribed.  Refills have been sent to the pharmacy.  Start the bupropion 1 week prior to stopping smoking and start with 1 tablet daily for 3 days then increase to 1 tablet by mouth twice daily for remainder of treatment. Be sure to have a stress relief plan as discussed.   Plan for follow up in 4 months or sooner if needed with lab work on the same day and with pharmacy provider in between.   Have a great day and stay safe!

## 2024-09-24 NOTE — Progress Notes (Unsigned)
 Brief Narrative   Patient ID: Gabriel [name], male    DOB: September 02, 1976, 48 y.o.   MRN: [id number]  Gabriel [name] is a 48 y/o AA male diagnosed with HIV disease in January 2008 with risk factor of MSM. Initial viral load of 96,900 and CD4 count 160. Genosure with K103N (efavirenz, nevirapine) medication resistance. Entered care at [facility] Stage 3. Previous ART experience with Prezista /ritonavir , Prezcobix , Truvada , Descovy, and [name].   Subjective:   Chief Complaint  Patient presents with   Follow-up    B20     HPI:  Gabriel [name] is a 48 y.o. male with HIV disease last seen on 1027/25 with well-controlled virus and good adherence and tolerance to [name] .  Previous lab work from 07/30/2024 with viral load that was undetectable and CD4 count 515.  In the interim has been diagnosed with stage II lung cancer and is awaiting surgical evaluation and will likely need chemotherapy.  Here today for follow-up and next every 2 month injection.  Gabriel [name] has been doing okay since his last office visit and continues to tolerate [name]  with no adverse side effects. Awaiting surgical evaluation on Friday for newly diagnosed lung cancer. Mood has been okay and continues to take sertraline  and use trazodone  for sleep. Interested in quitting smoking. Working full time and housing, transportation and access to food are stable. No suicidal ideations or signs of psychosis. Has been drinking alcohol albeit less than previous. Healthcare maintenance reviewed. Condoms and site specific STD testing offered.   Denies fevers, chills, night sweats, headaches, changes in vision, neck pain/stiffness, nausea, diarrhea, vomiting, lesions or rashes.  Lab Results  Component Value Date   CD4TCELL 32 (L) 09/24/2024   CD4TABS 433 09/24/2024   Lab Results  Component Value Date   HIV1RNAQUANT 61 (H) 07/30/2024     No Known Allergies    Outpatient Medications Prior to Visit  Medication Sig Dispense Refill    acetaminophen  (TYLENOL ) 325 MG tablet Take 2 tablets (650 mg total) by mouth every 6 (six) hours as needed for mild pain (pain score 1-3), fever or headache (or Fever >/= 101).     Cabotegravir  & Rilpivirine  ([name]  IM) Inject into the muscle. Every 2 months     fexofenadine -pseudoephedrine (ALLEGRA-D) 60-120 MG 12 hr tablet Take 1 tablet by mouth 2 (two) times daily. 20 tablet 0   methocarbamol  (ROBAXIN ) 500 MG tablet Take 2 tablets (1,000 mg total) by mouth every 8 (eight) hours as needed for muscle spasms. 30 tablet 0   nicotine  (NICODERM CQ  - DOSED IN MG/24 HOURS) 14 mg/24hr patch Place 1 patch (14 mg total) onto the skin daily. 30 patch 6   nicotine  (NICODERM CQ  - DOSED IN MG/24 HR) 7 mg/24hr patch Place 7 mg onto the skin daily.     Vitamin D , Ergocalciferol , (DRISDOL ) 1.25 MG (50000 UNIT) CAPS capsule Take 1 capsule (50,000 Units total) by mouth every 7 (seven) days. 12 capsule 0   hydrOXYzine  (ATARAX ) 25 MG tablet Take 1 tablet (25 mg total) by mouth every 6 (six) hours as needed for anxiety (or CIWA score </= 10).     rosuvastatin  (CRESTOR ) 20 MG tablet Take 1 tablet (20 mg total) by mouth at bedtime. 30 tablet 5   sertraline  (ZOLOFT ) 50 MG tablet Take 1 tablet (50 mg total) by mouth daily. 30 tablet 5   traZODone  (DESYREL ) 50 MG tablet Take 1 tablet (50 mg total) by mouth at bedtime as needed for sleep. 30 tablet  5   APIXABAN  (ELIQUIS ) VTE STARTER PACK (10MG  AND 5MG ) Take as directed on package: start with two-5mg  tablets twice daily for 7 days. On day 8, switch to one-5mg  tablet twice daily. (Patient not taking: Reported on 09/24/2024) 74 each 0   No facility-administered medications prior to visit.     Past Medical History:  Diagnosis Date   Abscess    buttocks   Anemia    Bilateral lower extremity pain    Cellulitis    Depression    GERD (gastroesophageal reflux disease)    Hematochezia    HIV positive (HCC)    IVDU (intravenous drug user)    MRSA infection    Syphilis of  nose 2008   Tobacco user      Past Surgical History:  Procedure Laterality Date   COLONOSCOPY     ENDOBRONCHIAL ULTRASOUND Bilateral 09/03/2024   Procedure: ENDOBRONCHIAL ULTRASOUND (EBUS);  Surgeon: Malka Domino, MD;  Location: [facility] ORS;  Service: Pulmonary;  Laterality: Bilateral;   HAND / FINGER LESION EXCISION Left    TRACHEOSTOMY TUBE PLACEMENT N/A 07/09/2024   Procedure: NASAL INTUBATION;  Surgeon: Luciano Standing, MD;  Location: [facility];  Service: ENT;  Laterality: N/A;   VIDEO BRONCHOSCOPY WITH ENDOBRONCHIAL NAVIGATION Right 09/03/2024   Procedure: VIDEO BRONCHOSCOPY WITH ENDOBRONCHIAL NAVIGATION;  Surgeon: Malka Domino, MD;  Location: [facility] OR[facility];  Service: Pulmonary;  Laterality: Right;   VIDEO BRONCHOSCOPY WITH ENDOBRONCHIAL ULTRASOUND Right 09/03/2024   Procedure: BRONCHOSCOPY, WITH EBUS;  Surgeon: Malka Domino, MD;  Location: [facility] ORS;  Service: Pulmonary;  Laterality: Right;        Review of Systems  Constitutional:  Negative for appetite change, chills, fatigue, fever and unexpected weight change.  Eyes:  Negative for visual disturbance.  Respiratory:  Negative for cough, chest tightness, shortness of breath and wheezing.   Cardiovascular:  Negative for chest pain and leg swelling.  Gastrointestinal:  Negative for abdominal pain, constipation, diarrhea, nausea and vomiting.  Genitourinary:  Negative for dysuria, flank pain, frequency, genital sores, hematuria and urgency.  Skin:  Negative for rash.  Allergic/Immunologic: Negative for immunocompromised state.  Neurological:  Negative for dizziness and headaches.     Objective:   BP 107/71   Pulse (!) 109   Temp 98.5 F (36.9 C) (Oral)   Resp 16   Wt 245 lb (111.1 kg)   SpO2 98%   BMI 28.31 kg/m  Nursing note and vital signs reviewed.  Physical Exam Constitutional:      General: He is not in acute distress.    Appearance: He is well-developed.  Eyes:     Conjunctiva/sclera:  Conjunctivae normal.  Cardiovascular:     Rate and Rhythm: Normal rate and regular rhythm.     Heart sounds: Normal heart sounds. No murmur heard.    No friction rub. No gallop.  Pulmonary:     Effort: Pulmonary effort is normal. No respiratory distress.     Breath sounds: Normal breath sounds. No wheezing or rales.  Chest:     Chest wall: No tenderness.  Abdominal:     General: Bowel sounds are normal.     Palpations: Abdomen is soft.     Tenderness: There is no abdominal tenderness.  Musculoskeletal:     Cervical back: Neck supple.  Lymphadenopathy:     Cervical: No cervical adenopathy.  Skin:    General: Skin is warm and dry.     Findings: No rash.  Neurological:     Mental Status: He is  alert and oriented to person, place, and time.          02/05/2024    3:25 PM 05/29/2023    2:51 PM 04/03/2023    3:19 PM 07/11/2022    2:58 PM 02/12/2021    8:42 AM  Depression screen PHQ 2/9  Decreased Interest 0 0 0 0 0  Down, Depressed, Hopeless 0 0 0 0 0  PHQ - 2 Score 0 0 0 0 0        02/05/2024    3:25 PM  GAD 7 : Generalized Anxiety Score  Nervous, Anxious, on Edge 0     The 10-year ASCVD risk score (Arnett DK, et al., 2019) is: 7.1%   Values used to calculate the score:     Age: 89 years     Clincally relevant sex: Male     Is Non-Hispanic African American: Yes     Diabetic: No     Tobacco smoker: Yes     Systolic Blood Pressure: 107 mmHg     Is BP treated: No     HDL Cholesterol: 29 mg/dL     Total Cholesterol: 202 mg/dL      Assessment & Plan:    Patient Active Problem List   Diagnosis Date Noted   Cigarette nicotine  dependence, uncomplicated 09/24/2024   Adenocarcinoma of upper lobe of right lung (HCC) 09/12/2024   Lung mass 08/19/2024   Pulmonary embolus (HCC) 08/19/2024   Substance induced mood disorder (HCC) 07/09/2024   Intentional overdose (HCC) 07/06/2024   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   Cocaine use 07/06/2024   Drug overdose 07/06/2024   At  risk for cardiovascular event 02/05/2024   Skin lesion 04/03/2023   Alcohol use 08/04/2022   Eustachian tube dysfunction 12/20/2019   Bilateral lower extremity pain 12/20/2019   Cough 04/30/2019   Healthcare maintenance 01/18/2019   Genital warts 06/27/2017   IVDU (intravenous drug user) 06/27/2017   Depression 06/27/2017   Hematochezia 03/26/2015   Dermatitis 06/10/2014   Dyslipidemia 04/14/2011   Blood in stool 03/20/2009   METHICILLIN RESISTANT STAPH AUREUS SEPTICEMIA 01/02/2009   Dental caries 01/02/2009   TOBACCO USER 05/27/2008   INSOMNIA, CHRONIC 05/27/2008   Human immunodeficiency virus (HIV) disease (HCC) 11/28/2006   SYPHILIS NOS 11/28/2006     Problem List Items Addressed This Visit       Other   Human immunodeficiency virus (HIV) disease (HCC) - Primary   Mr. Kerschner continues to have adequately controlled virus with good adherence and tolerance to [name] . Did have low level viremia with last lab work of 61 and will recheck today. Reviewed previous lab work and discussed plan of care and U equals U. Check lab work. Every 2 month injection provided without complication with target date of the 25th of the month. Plan for follow up in 4 months or sooner if needed with pharmacy provider in between.       Relevant Orders   Comprehensive metabolic panel with GFR (Completed)   HIV-1 RNA quant-no reflex-bld   T-helper cell (CD4)- ([facility] clinic only) (Completed)   Depression   Mood appears stable despite multiple stressors and newly diagnosed lung cancer. No suicidal ideation or signs of psychosis. Working on finding a veterinary surgeon. Continue current dose of Sertraline . Addition of bupropion  may also help with depression while he works to quit smoking.        Relevant Medications   buPROPion  (WELLBUTRIN  SR) 150 MG 12 hr tablet   sertraline  (ZOLOFT ) 50 MG tablet  traZODone  (DESYREL ) 50 MG tablet   Healthcare maintenance   Discussed importance of safe sexual practice and  condom use. Condoms and site specific STD testing offered.  Following counseling influenza and Prevnar 20 updated. Due for colon cancer screening through colonoscopy. Offered cologuard which was declined.       Cigarette nicotine  dependence, uncomplicated   Mr. Prunty is interested in quitting smoking and previously has tried patches and Chantix. Following discussion will start bupropion 1 week prior to quit date and then will continue for 12 weeks total. Resources provided to aid in success.       Other Visit Diagnoses       Encounter for immunization       Relevant Orders   Flu vaccine trivalent PF, 6mos and older(Flulaval,Afluria,Fluarix,Fluzone) (Completed)   Pneumococcal conjugate vaccine 20-valent (Completed)        I have discontinued Shamarcus L. Mcginnity's hydrOXYzine . I am also having him start on buPROPion. Additionally, I am having him maintain his fexofenadine -pseudoephedrine, acetaminophen , Vitamin D  (Ergocalciferol ), methocarbamol , Apixaban  Starter Pack (10mg  and 5mg ), Cabotegravir  & Rilpivirine  ([name]  IM), nicotine , nicotine , sertraline , traZODone , and rosuvastatin . We administered cabotegravir  & rilpivirine  ER.   Meds ordered this encounter  Medications   buPROPion (WELLBUTRIN SR) 150 MG 12 hr tablet    Sig: Take 1 tablet by mouth daily for 3 days then 1 tablet by mouth twice daily.    Dispense:  63 tablet    Refill:  2    Supervising Provider:   SNIDER, CYNTHIA [[id number]]   sertraline  (ZOLOFT ) 50 MG tablet    Sig: Take 1 tablet (50 mg total) by mouth daily.    Dispense:  30 tablet    Refill:  5    Supervising Provider:   SNIDER, CYNTHIA [[id number]]   traZODone  (DESYREL ) 50 MG tablet    Sig: Take 1 tablet (50 mg total) by mouth at bedtime as needed for sleep.    Dispense:  30 tablet    Refill:  5    Supervising Provider:   SNIDER, CYNTHIA [phone]   rosuvastatin  (CRESTOR ) 20 MG tablet    Sig: Take 1 tablet (20 mg total) by mouth at bedtime.    Dispense:  30 tablet     Refill:  5    Supervising Provider:   SNIDER, CYNTHIA [[id number]]   cabotegravir  & rilpivirine  ER ([name] ) 600 & 900 MG/3ML injection 1 kit     Follow-up: Return in about 4 months (around 01/23/2025). or sooner if needed.    Cathlyn July, MSN, FNP-C Nurse Practitioner [facility] Main number: [phone]

## 2024-09-25 ENCOUNTER — Encounter: Payer: Self-pay | Admitting: Family

## 2024-09-25 LAB — T-HELPER CELL (CD4) - (RCID CLINIC ONLY)
CD4 % Helper T Cell: 32 % — ABNORMAL LOW (ref 33–65)
CD4 T Cell Abs: 433 /uL (ref 400–1790)

## 2024-09-25 NOTE — Assessment & Plan Note (Signed)
 Discussed importance of safe sexual practice and condom use. Condoms and site specific STD testing offered.  Following counseling influenza and Prevnar 20 updated. Due for colon cancer screening through colonoscopy. Offered cologuard which was declined.

## 2024-09-25 NOTE — Assessment & Plan Note (Signed)
 Gabriel [name] continues to have adequately controlled virus with good adherence and tolerance to [name] . Did have low level viremia with last lab work of 61 and will recheck today. Reviewed previous lab work and discussed plan of care and U equals U. Check lab work. Every 2 month injection provided without complication with target date of the 25th of the month. Plan for follow up in 4 months or sooner if needed with pharmacy provider in between.

## 2024-09-25 NOTE — Assessment & Plan Note (Signed)
 Mood appears stable despite multiple stressors and newly diagnosed lung cancer. No suicidal ideation or signs of psychosis. Working on finding a veterinary surgeon. Continue current dose of Sertraline . Addition of bupropion may also help with depression while he works to quit smoking.

## 2024-09-25 NOTE — Assessment & Plan Note (Signed)
 Mr. Gabriel [name] is interested in quitting smoking and previously has tried patches and Chantix. Following discussion will start bupropion 1 week prior to quit date and then will continue for 12 weeks total. Resources provided to aid in success.

## 2024-09-26 LAB — COMPREHENSIVE METABOLIC PANEL WITH GFR
AG Ratio: 1.6 (calc) (ref 1.0–2.5)
ALT: 13 U/L (ref 9–46)
AST: 13 U/L (ref 10–40)
Albumin: 4.7 g/dL (ref 3.6–5.1)
Alkaline phosphatase (APISO): 109 U/L (ref 36–130)
BUN: 21 mg/dL (ref 7–25)
CO2: 26 mmol/L (ref 20–32)
Calcium: 10.1 mg/dL (ref 8.6–10.3)
Chloride: 103 mmol/L (ref 98–110)
Creat: 0.92 mg/dL (ref 0.60–1.29)
Globulin: 2.9 g/dL (ref 1.9–3.7)
Glucose, Bld: 103 mg/dL — ABNORMAL HIGH (ref 65–99)
Potassium: 4.2 mmol/L (ref 3.5–5.3)
Sodium: 139 mmol/L (ref 135–146)
Total Bilirubin: 0.4 mg/dL (ref 0.2–1.2)
Total Protein: 7.6 g/dL (ref 6.1–8.1)
eGFR: 103 mL/min/1.73m2 (ref >=60)

## 2024-09-26 LAB — HIV-1 RNA QUANT-NO REFLEX-BLD
HIV 1 RNA Quant: 89 {copies}/mL — ABNORMAL HIGH
HIV-1 RNA Quant, Log: 1.95 {Log_copies}/mL — ABNORMAL HIGH

## 2024-09-27 ENCOUNTER — Other Ambulatory Visit: Payer: Self-pay | Admitting: Thoracic Surgery (Cardiothoracic Vascular Surgery)

## 2024-09-27 ENCOUNTER — Encounter: Payer: Self-pay | Admitting: Thoracic Surgery (Cardiothoracic Vascular Surgery)

## 2024-09-27 ENCOUNTER — Ambulatory Visit
Attending: Thoracic Surgery (Cardiothoracic Vascular Surgery) | Admitting: Thoracic Surgery (Cardiothoracic Vascular Surgery)

## 2024-09-27 ENCOUNTER — Ambulatory Visit: Payer: Self-pay | Admitting: Family

## 2024-09-27 VITALS — BP 108/61 | HR 105 | Resp 20 | Ht 78.0 in | Wt 246.6 lb

## 2024-09-27 DIAGNOSIS — C3411 Malignant neoplasm of upper lobe, right bronchus or lung: Secondary | ICD-10-CM | POA: Diagnosis not present

## 2024-09-27 NOTE — Progress Notes (Signed)
 [address] [place] [address]             [phone]                   DEROY NOAH [facility] Medical Record #[id number] Date of Birth: 1976/04/25  Referring: Malka Domino, MD Primary Care: Philemon Cordella BIRCH, FNP Primary Cardiologist: None  Chief Complaint:    Chief Complaint  Patient presents with   Lung Mass    New patient consult, review all studies    History of Present Illness:    Gabriel [name] is a 48 y.o. 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  He does have a significant smoking history and most recently was biopsied at [facility]  This right upper lobe mass originally measured 3.5 cm, and there also was a satellite lesion in the upper lobe as well apically.  This was found incidentally.   Zubrod Score: At the time of surgery this patient's most appropriate activity status/level should be described as: [x]     0    Normal activity, no symptoms []     1    Restricted in physical strenuous activity but ambulatory, able to do out light work []     2    Ambulatory and capable of self care, unable to do work activities, up and about               >50 % of waking hours                              []     3    Only limited self care, in bed greater than 50% of waking hours []     4    Completely disabled, no self care, confined to bed or chair []     5    Moribund     Past Medical History:  Diagnosis Date   Abscess    buttocks   Anemia    Bilateral lower extremity pain    Cellulitis    Depression    GERD (gastroesophageal reflux disease)    Hematochezia    HIV positive (HCC)    IVDU (intravenous drug user)    MRSA infection    Syphilis of nose 2008   Tobacco user     Past Surgical History:  Procedure Laterality Date   COLONOSCOPY     ENDOBRONCHIAL ULTRASOUND Bilateral 09/03/2024   Procedure: ENDOBRONCHIAL ULTRASOUND (EBUS);  Surgeon:  Malka Domino, MD;  Location: [facility] ORS;  Service: Pulmonary;  Laterality: Bilateral;   HAND / FINGER LESION EXCISION Left    TRACHEOSTOMY TUBE PLACEMENT N/A 07/09/2024   Procedure: NASAL INTUBATION;  Surgeon: Luciano Standing, MD;  Location: [facility] ORS;  Service: ENT;  Laterality: N/A;   VIDEO BRONCHOSCOPY WITH ENDOBRONCHIAL NAVIGATION Right 09/03/2024   Procedure: VIDEO BRONCHOSCOPY WITH ENDOBRONCHIAL NAVIGATION;  Surgeon: Malka Domino, MD;  Location: [facility] ORS;  Service: Pulmonary;  Laterality: Right;   VIDEO BRONCHOSCOPY WITH ENDOBRONCHIAL ULTRASOUND Right 09/03/2024   Procedure: BRONCHOSCOPY, WITH EBUS;  Surgeon: Malka Domino, MD;  Location: [facility] ORS;  Service: Pulmonary;  Laterality: Right;    Family History  Problem Relation Age of Onset   Hypertension Mother    Cancer Maternal Grandmother    Crohn's disease Brother      Social History  Tobacco Use  Smoking Status Every Day   Current packs/day: 0.00   Types: Cigarettes   Last attempt to quit: 08/19/2024   Years since quitting: 0.1  Smokeless Tobacco Never  Tobacco Comments   Quit on 08/19/2024   Started smoking at 48 yrs old   Smoked 2 PPD at his heaviest    Social History   Substance and Sexual Activity  Alcohol Use Yes   Alcohol/week: 3.0 standard drinks of alcohol   Types: 3 Standard drinks or equivalent per week   Comment: socially      No Known Allergies  Current Outpatient Medications  Medication Sig Dispense Refill   acetaminophen  (TYLENOL ) 325 MG tablet Take 2 tablets (650 mg total) by mouth every 6 (six) hours as needed for mild pain (pain score 1-3), fever or headache (or Fever >/= 101).     APIXABAN  (ELIQUIS ) VTE STARTER PACK (10MG  AND 5MG ) Take as directed on package: start with two-5mg  tablets twice daily for 7 days. On day 8, switch to one-5mg  tablet twice daily. 74 each 0   buPROPion  (WELLBUTRIN  SR) 150 MG 12 hr tablet Take 1 tablet by mouth daily for 3 days then 1 tablet by mouth  twice daily. 63 tablet 2   Cabotegravir  & Rilpivirine  ([name]  IM) Inject into the muscle. Every 2 months     fexofenadine -pseudoephedrine (ALLEGRA-D) 60-120 MG 12 hr tablet Take 1 tablet by mouth 2 (two) times daily. 20 tablet 0   methocarbamol  (ROBAXIN ) 500 MG tablet Take 2 tablets (1,000 mg total) by mouth every 8 (eight) hours as needed for muscle spasms. 30 tablet 0   rosuvastatin  (CRESTOR ) 20 MG tablet Take 1 tablet (20 mg total) by mouth at bedtime. 30 tablet 5   sertraline  (ZOLOFT ) 50 MG tablet Take 1 tablet (50 mg total) by mouth daily. 30 tablet 5   traZODone  (DESYREL ) 50 MG tablet Take 1 tablet (50 mg total) by mouth at bedtime as needed for sleep. 30 tablet 5   Vitamin D , Ergocalciferol , (DRISDOL ) 1.25 MG (50000 UNIT) CAPS capsule Take 1 capsule (50,000 Units total) by mouth every 7 (seven) days. 12 capsule 0   No current facility-administered medications for this visit.    Review of Systems  Respiratory:  Positive for cough. Negative for shortness of breath.   Cardiovascular:  Negative for chest pain.     PHYSICAL EXAMINATION: BP 108/61 (BP Location: Left Arm, Patient Position: Sitting, Cuff Size: Large)   Pulse (!) 105   Resp 20   Ht 6' 6 (1.981 m)   Wt 246 lb 9.6 oz (111.9 kg)   SpO2 96% Comment: RA  BMI 28.50 kg/m  Physical Exam Constitutional:      General: He is not in acute distress.    Appearance: He is not ill-appearing.  HENT:     Head: Normocephalic.  Eyes:     Extraocular Movements: Extraocular movements intact.  Cardiovascular:     Rate and Rhythm: Tachycardia present.  Pulmonary:     Effort: Pulmonary effort is normal. No respiratory distress.  Abdominal:     General: Abdomen is flat. There is no distension.  Musculoskeletal:        General: Normal range of motion.     Cervical back: Normal range of motion.  Skin:    General: Skin is warm and dry.  Neurological:     General: No focal deficit present.     Mental Status: He is alert and  oriented to person, place, and time.  I have independently reviewed the above radiology studies  and reviewed the findings with the patient.   Recent Lab Findings: Lab Results  Component Value Date   WBC 5.5 08/19/2024   HGB 14.9 08/19/2024   HCT 44.7 08/19/2024   PLT 188 08/19/2024   GLUCOSE 103 (H) 09/24/2024   CHOL 202 (H) 02/05/2024   TRIG 860 (H) 02/05/2024   HDL 29 (L) 02/05/2024   LDLCALC  02/05/2024     Comment:     . LDL cholesterol not calculated. Triglyceride levels greater than 400 mg/dL invalidate calculated LDL results. . Reference range: <100 . Desirable range <100 mg/dL for primary prevention;   <70 mg/dL for patients with CHD or diabetic patients  with > or = 2 CHD risk factors. SABRA LDL-C is now calculated using the Martin-Hopkins  calculation, which is a validated novel method providing  better accuracy than the Friedewald equation in the  estimation of LDL-C.  Gladis APPLETHWAITE et al. SANDREA. 7986;689(80): 2061-2068  (http://education.[facility].com/faq/FAQ164)    ALT 13 09/24/2024   AST 13 09/24/2024   NA 139 09/24/2024   K 4.2 09/24/2024   CL 103 09/24/2024   CREATININE 0.92 09/24/2024   BUN 21 09/24/2024   CO2 26 09/24/2024   TSH 1.009 07/09/2024   INR 1.3 (H) 07/07/2024   HGBA1C 6.2 (H) 12/04/2023    Diagnostic Studies & Laboratory data:     Recent Radiology Findings:   DG Chest Port 1 View Result Date: 09/03/2024 CLINICAL DATA:  Status post bronchoscopy. EXAM: PORTABLE CHEST 1 VIEW COMPARISON:  Chest radiograph dated 08/19/2024. FINDINGS: No focal consolidation, pleural effusion, pneumothorax. Right upper lobe nodules better seen on the prior radiograph and CT. The cardiac silhouette is within normal limits. No acute osseous pathology. IMPRESSION: No active disease.  No pneumothorax post bronchoscopy. Electronically Signed   By: Vanetta Chou M.D.   On: 09/03/2024 17:10   DG C-Arm 1-60 Min-No Report Result Date:  09/03/2024 Fluoroscopy was utilized by the requesting physician.  No radiographic interpretation.   CT SUPER D CHEST WO CONTRAST Result Date: 09/02/2024 EXAM: CT CHEST WITHOUT CONTRAST 09/02/2024 07:52:24 AM TECHNIQUE: CT of the chest was performed without the administration of intravenous contrast. Multiplanar reformatted images are provided for review. Automated exposure control, iterative reconstruction, and/or weight based adjustment of the mA/kV was utilized to reduce the radiation dose to as low as reasonably achievable. COMPARISON: CT Chest Abdomen Pelvis With IV Contrast 08/19/2024. CLINICAL HISTORY: Lung nodule, Other nonspecific abnormal finding of lung field - R91.8. FINDINGS: MEDIASTINUM: The heart is normal in size. No pericardial effusion. The aorta is normal in caliber. Minimal scattered atherosclerotic calcifications. The central tracheobronchial tree is unremarkable. The esophagus is unremarkable. LYMPH NODES: 10 mm right suprahilar node on image 62/8. 7 mm right paratracheal node on image 62/8. No axillary lymphadenopathy. LUNGS AND PLEURA: Irregular lobulated 18 mm right apical lung nodule is seen on the prior CT scan. Stable 3.5 cm irregular lobulated mass in the right upper lobe some surrounding hazy interstitial change. No other pulmonary lesions or nodules are identified. He left basilar scarring changes. No pleural effusion or pleural nodules. No pneumothorax. SOFT TISSUES/[name]: No chest wall mass, supraclavicular or axillary adenopathy. The bony thorax is intact. No acute abnormality of the [name] or soft tissues. UPPER ABDOMEN: Limited images of the upper abdomen demonstrates no significant upper or abdominal findings. No hepatic or adrenal gland lesions. IMPRESSION: 1. Stable 3.5 cm irregular lobulated right upper lobe mass with surrounding hazy interstitial  change. 2. Stable 18 mm irregular lobulated right apical pulmonary nodule. 3. Findings most consistent with synchronous lung  cancers. 4. 10mm Right suprahilar lymph node. 5. No findings to suggest upper abdominal metastatic disease or osseous metastatic disease. Electronically signed by: Maude Stammer MD 09/02/2024 10:50 AM EST RP Workstation: [id number]   NM PET Image Initial (PI) Skull Base To Thigh Result Date: 09/02/2024 EXAM: PET AND CT SKULL BASE TO MID THIGH 09/02/2024 10:08:10 AM TECHNIQUE: RADIOPHARMACEUTICAL: 11.6 mCi F-18 FDG Uptake time 60 minutes. Glucose level 113 mg/dl. PET imaging was acquired from the base of the skull to the mid thighs. Non-contrast enhanced computed tomography was obtained for attenuation correction and anatomic localization. COMPARISON: Chest CT 08/19/2024. CLINICAL HISTORY: Pulmonary lesions on recent CT scan. FINDINGS: HEAD AND NECK: No metabolically active cervical lymphadenopathy. No supraclavicular or axillary adenopathy. CHEST: The 17 mm lesion at the right lung apex is hypermetabolic with an SUV max of 15.3. The larger lesion slightly more caudal in the right upper lobe measures 3.3 cm and has an SUV max of 17.5. Findings are consistent with synchronous lung cancers. There is also a hypermetabolic right suprahilar node which measures 11 mm and has an SUV max of 8.6. No enlarged or hypermetabolic mediastinal lymph nodes. No other lung lesions are identified on the CT scan. Incidental findings include scattered aortic and coronary artery calcifications. ABDOMEN AND PELVIS: No metabolically active intraperitoneal mass. No metabolically active lymphadenopathy. Physiologic activity within the gastrointestinal and genitourinary systems. Incidental abdominal findings include age-advanced atherosclerotic calcification involving the distal aorta and iliac arteries. [name] AND SOFT TISSUE: No hypermetabolic bone lesions to suggest metastatic bone disease. Small areas of mild hypermetabolism superficial to the gluteus maximus muscles bilaterally, likely related to injections. IMPRESSION: 1. Findings  consistent with synchronous primary lung malignancies in the right lung apex and right upper lobe. 2. Hypermetabolic right suprahilar lymph node, suspicious for nodal metastasis. 3. No evidence of metastatic disease involving the abdomen, pelvis, or bony structures. Electronically signed by: Maude Stammer MD 09/02/2024 10:43 AM EST RP Workstation: [id number]     PFTs: pending    Assessment / Plan:   48 y.o. 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  He will require pulmonary function testing as well as an MRI of the brain.  I will follow-up with him after this to discuss further planning.  He will need to stop smoking prior to any surgical intervention.     I  spent 40 minutes with  the patient face to face in counseling and coordination of care.    Linnie MALVA Rayas 09/27/2024 4:19 PM

## 2024-09-30 ENCOUNTER — Other Ambulatory Visit: Payer: Self-pay | Admitting: Thoracic Surgery (Cardiothoracic Vascular Surgery)

## 2024-09-30 ENCOUNTER — Other Ambulatory Visit

## 2024-10-02 ENCOUNTER — Other Ambulatory Visit

## 2024-10-03 LAB — FUNGUS CULTURE WITH STAIN

## 2024-10-03 LAB — FUNGAL ORGANISM REFLEX

## 2024-10-03 LAB — FUNGUS CULTURE RESULT

## 2024-10-07 ENCOUNTER — Inpatient Hospital Stay (HOSPITAL_COMMUNITY)
Admission: RE | Admit: 2024-10-07 | Discharge: 2024-10-07 | Disposition: A | Source: Ambulatory Visit | Attending: Thoracic Surgery (Cardiothoracic Vascular Surgery)

## 2024-10-07 DIAGNOSIS — C3411 Malignant neoplasm of upper lobe, right bronchus or lung: Secondary | ICD-10-CM

## 2024-10-07 LAB — PULMONARY FUNCTION TEST
DL/VA % pred: 69 %
DL/VA: 2.99 ml/min/mmHg/L
DLCO unc % pred: 60 %
DLCO unc: 22.79 ml/min/mmHg
FEF 25-75 Post: 3.8 L/s
FEF 25-75 Pre: 3.45 L/s
FEF2575-%Change-Post: 10 %
FEF2575-%Pred-Post: 87 %
FEF2575-%Pred-Pre: 79 %
FEV1-%Change-Post: 1 %
FEV1-%Pred-Post: 85 %
FEV1-%Pred-Pre: 84 %
FEV1-Post: 4.33 L
FEV1-Pre: 4.27 L
FEV1FVC-%Change-Post: 1 %
FEV1FVC-%Pred-Pre: 96 %
FEV6-%Change-Post: 1 %
FEV6-%Pred-Post: 86 %
FEV6-%Pred-Pre: 85 %
FEV6-Post: 5.47 L
FEV6-Pre: 5.4 L
FEV6FVC-%Change-Post: 1 %
FEV6FVC-%Pred-Post: 100 %
FEV6FVC-%Pred-Pre: 98 %
FVC-%Change-Post: 0 %
FVC-%Pred-Post: 86 %
FVC-%Pred-Pre: 86 %
FVC-Post: 5.64 L
FVC-Pre: 5.66 L
Post FEV1/FVC ratio: 77 %
Post FEV6/FVC ratio: 97 %
Pre FEV1/FVC ratio: 75 %
Pre FEV6/FVC Ratio: 95 %
RV % pred: 76 %
RV: 1.87 L
TLC % pred: 89 %
TLC: 7.69 L

## 2024-10-07 MED ORDER — ALBUTEROL SULFATE (2.5 MG/3ML) 0.083% IN NEBU
2.5000 mg | INHALATION_SOLUTION | Freq: Once | RESPIRATORY_TRACT | Status: AC
  Administered 2024-10-07: 16:00:00 2.5 mg via RESPIRATORY_TRACT

## 2024-10-09 ENCOUNTER — Inpatient Hospital Stay

## 2024-10-11 ENCOUNTER — Inpatient Hospital Stay: Admission: RE | Admit: 2024-10-11

## 2024-10-11 DIAGNOSIS — C3411 Malignant neoplasm of upper lobe, right bronchus or lung: Secondary | ICD-10-CM

## 2024-10-11 MED ORDER — GADOPICLENOL 0.5 MMOL/ML IV SOLN
10.0000 mL | Freq: Once | INTRAVENOUS | Status: AC | PRN
  Administered 2024-10-11: 10 mL via INTRAVENOUS

## 2024-10-18 LAB — ACID FAST CULTURE WITH REFLEXED SENSITIVITIES (MYCOBACTERIA): Acid Fast Culture: NEGATIVE

## 2024-10-25 ENCOUNTER — Ambulatory Visit
Attending: Thoracic Surgery (Cardiothoracic Vascular Surgery) | Admitting: Thoracic Surgery (Cardiothoracic Vascular Surgery)

## 2024-10-25 DIAGNOSIS — C3411 Malignant neoplasm of upper lobe, right bronchus or lung: Secondary | ICD-10-CM

## 2024-10-25 NOTE — Progress Notes (Signed)
" °   °  [address] [place] [address]             [phone]     Patient: Home Provider: Office Consent for Telemedicine visit obtained.  Todays visit was completed via a real-time telehealth (see specific modality noted below). The patient/authorized person provided oral consent at the time of the visit to engage in a telemedicine encounter with the present provider at [facility]. The patient/authorized person was informed of the potential benefits, limitations, and risks of telemedicine. The patient/authorized person expressed understanding that the laws that protect confidentiality also apply to telemedicine. The patient/authorized person acknowledged understanding that telemedicine does not provide emergency services and that he or she would need to call 911 or proceed to the nearest hospital for help if such a need arose.   Total time spent in the clinical discussion 10 minutes.  Telehealth Modality: Phone visit (audio only)  I had a telephone visit with [name] I discussed the results of the MRI and PFTs with him.  He is doing well with quitting smoking.  I also explained to him that since he appears of have metastasis to his lymph nodes, he will need chemotherapy following surgery.  He would like to come back for follow up in February.  Goble Fudala O Sharmeka Palmisano   "

## 2024-10-30 ENCOUNTER — Inpatient Hospital Stay: Attending: Oncology

## 2024-10-30 DIAGNOSIS — C3411 Malignant neoplasm of upper lobe, right bronchus or lung: Secondary | ICD-10-CM

## 2024-10-30 NOTE — Progress Notes (Signed)
 Multidisciplinary Oncology Council Documentation  GINA COSTILLA was presented by our [facility] on 10/30/2024, which included representatives from:  Palliative Care Dietitian  Physical/Occupational Therapist Nurse Navigator Genetics Social work Survivorship RN Financial Navigator Research RN   Alonte currently presents with history of lung cancer  We reviewed previous medical and familial history, history of present illness, and recent lab results along with all available histopathologic and imaging studies. The MOC considered available treatment options and made the following recommendations/referrals:  None   The MOC is a meeting of clinicians from various specialty areas who evaluate and discuss patients for whom a multidisciplinary approach is being considered. Final determinations in the plan of care are those of the provider(s).   Today's extended care, comprehensive team conference, Jafet was not present for the discussion and was not examined.

## 2024-11-18 ENCOUNTER — Other Ambulatory Visit (HOSPITAL_COMMUNITY): Payer: Self-pay

## 2024-11-22 NOTE — Progress Notes (Unsigned)
 "  HPI: Gabriel [name] is a 49 y.o. male who presents to the [facility] pharmacy clinic for [name]  administration.  Referring ID Physician: Cathlyn July, NP   Patient Active Problem List   Diagnosis Date Noted   Cigarette nicotine  dependence, uncomplicated 09/24/2024   Adenocarcinoma of upper lobe of right lung (HCC) 09/12/2024   Lung mass 08/19/2024   Pulmonary embolus (HCC) 08/19/2024   Substance induced mood disorder (HCC) 07/09/2024   Intentional overdose (HCC) 07/06/2024   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   Cocaine use 07/06/2024   Drug overdose 07/06/2024   At risk for cardiovascular event 02/05/2024   Skin lesion 04/03/2023   Alcohol use 08/04/2022   Eustachian tube dysfunction 12/20/2019   Bilateral lower extremity pain 12/20/2019   Cough 04/30/2019   Healthcare maintenance 01/18/2019   Genital warts 06/27/2017   IVDU (intravenous drug user) 06/27/2017   Depression 06/27/2017   Hematochezia 03/26/2015   Dermatitis 06/10/2014   Dyslipidemia 04/14/2011   Blood in stool 03/20/2009   METHICILLIN RESISTANT STAPH AUREUS SEPTICEMIA 01/02/2009   Dental caries 01/02/2009   TOBACCO USER 05/27/2008   INSOMNIA, CHRONIC 05/27/2008   Human immunodeficiency virus (HIV) disease (HCC) 11/28/2006   SYPHILIS NOS 11/28/2006    Patient's Medications  New Prescriptions   No medications on file  Previous Medications   ACETAMINOPHEN  (TYLENOL ) 325 MG TABLET    Take 2 tablets (650 mg total) by mouth every 6 (six) hours as needed for mild pain (pain score 1-3), fever or headache (or Fever >/= 101).   APIXABAN  (ELIQUIS ) VTE STARTER PACK (10MG  AND 5MG )    Take as directed on package: start with two-5mg  tablets twice daily for 7 days. On day 8, switch to one-5mg  tablet twice daily.   BUPROPION  (WELLBUTRIN  SR) 150 MG 12 HR TABLET    Take 1 tablet by mouth daily for 3 days then 1 tablet by mouth twice daily.   CABOTEGRAVIR  & RILPIVIRINE  ([name]  IM)    Inject into the muscle. Every 2 months    FEXOFENADINE -PSEUDOEPHEDRINE (ALLEGRA-D) 60-120 MG 12 HR TABLET    Take 1 tablet by mouth 2 (two) times daily.   METHOCARBAMOL  (ROBAXIN ) 500 MG TABLET    Take 2 tablets (1,000 mg total) by mouth every 8 (eight) hours as needed for muscle spasms.   ROSUVASTATIN  (CRESTOR ) 20 MG TABLET    Take 1 tablet (20 mg total) by mouth at bedtime.   SERTRALINE  (ZOLOFT ) 50 MG TABLET    Take 1 tablet (50 mg total) by mouth daily.   TRAZODONE  (DESYREL ) 50 MG TABLET    Take 1 tablet (50 mg total) by mouth at bedtime as needed for sleep.  Modified Medications   No medications on file  Discontinued Medications   No medications on file    Allergies: Allergies[1]  Past Medical History: Past Medical History:  Diagnosis Date   Abscess    buttocks   Anemia    Bilateral lower extremity pain    Cellulitis    Depression    GERD (gastroesophageal reflux disease)    Hematochezia    HIV positive (HCC)    IVDU (intravenous drug user)    MRSA infection    Syphilis of nose 2008   Tobacco user     Social History: Social History   Socioeconomic History   Marital status: Single    Spouse name: Not on file   Number of children: Not on file   Years of education: Not on file   Highest education level:  Not on file  Occupational History   Not on file  Tobacco Use   Smoking status: Every Day    Current packs/day: 0.00    Types: Cigarettes    Last attempt to quit: 08/19/2024    Years since quitting: 0.2   Smokeless tobacco: Never   Tobacco comments:    Quit on 08/19/2024    Started smoking at 49 yrs old    Smoked 2 PPD at his heaviest  Vaping Use   Vaping status: Some Days  Substance and Sexual Activity   Alcohol use: Yes    Alcohol/week: 3.0 standard drinks of alcohol    Types: 3 Standard drinks or equivalent per week    Comment: socially    Drug use: Not Currently    Types: IV, Methamphetamines, Cocaine    Comment: only meth at this point   Sexual activity: Not Currently    Partners: Male     Comment: declined condoms  Other Topics Concern   Not on file  Social History Narrative   internet processor   Social Drivers of Health   Tobacco Use: High Risk (09/27/2024)   Patient History    Smoking Tobacco Use: Every Day    Smokeless Tobacco Use: Never    Passive Exposure: Not on file  Financial Resource Strain: Not on file  Food Insecurity: No Food Insecurity (09/10/2024)   Epic    Worried About Programme Researcher, Broadcasting/film/video in the Last Year: Never true    Ran Out of Food in the Last Year: Never true  Transportation Needs: No Transportation Needs (09/10/2024)   Epic    Lack of Transportation (Medical): No    Lack of Transportation (Non-Medical): No  Physical Activity: Not on file  Stress: Not on file  Social Connections: Not on file  Depression (PHQ2-9): Low Risk (02/05/2024)   Depression (PHQ2-9)    PHQ-2 Score: 0  Alcohol Screen: Not on file  Housing: Unknown (09/10/2024)   Epic    Unable to Pay for Housing in the Last Year: No    Number of Times Moved in the Last Year: Not on file    Homeless in the Last Year: No  Utilities: Not At Risk (09/10/2024)   Epic    Threatened with loss of utilities: No  Health Literacy: Not on file    Labs: Lab Results  Component Value Date   HIV1RNAQUANT 89 (H) 09/24/2024   HIV1RNAQUANT 61 (H) 07/30/2024   HIV1RNAQUANT 27 (H) 02/05/2024   CD4TABS 433 09/24/2024   CD4TABS 533 02/05/2024   CD4TABS 504 05/29/2023    RPR and STI Lab Results  Component Value Date   LABRPR REACTIVE (A) 04/03/2023   LABRPR REACTIVE (A) 07/11/2022   LABRPR REACTIVE (A) 12/25/2020   LABRPR REACTIVE (A) 07/06/2020   LABRPR REACTIVE (A) 12/20/2019   RPRTITER 1:1 (H) 04/03/2023   RPRTITER 1:1 (H) 07/11/2022   RPRTITER 1:2 (H) 12/25/2020   RPRTITER 1:4 (H) 07/06/2020   RPRTITER 1:2 (H) 12/20/2019    STI Results GC CT  06/27/2017 12:00 AM Negative    Negative    Negative  Negative    Negative    Negative   12/17/2014 12:00 AM NG: Negative  CT:  Negative     Hepatitis B Lab Results  Component Value Date   HEPBSAB REACTIVE (A) 01/24/2023   HEPBSAG NON-REACTIVE 01/24/2023   HEPBCAB POS (A) 11/10/2006   Hepatitis C No results found for: HEPCAB, HCVRNAPCRQN Hepatitis A Lab Results  Component Value  Date   HAV REACTIVE (A) 01/24/2023   Lipids: Lab Results  Component Value Date   CHOL 202 (H) 02/05/2024   TRIG 860 (H) 02/05/2024   HDL 29 (L) 02/05/2024   CHOLHDL 7.0 (H) 02/05/2024   VLDL 53 (H) 04/24/2014   LDLCALC  02/05/2024     Comment:     . LDL cholesterol not calculated. Triglyceride levels greater than 400 mg/dL invalidate calculated LDL results. . Reference range: <100 . Desirable range <100 mg/dL for primary prevention;   <70 mg/dL for patients with CHD or diabetic patients  with > or = 2 CHD risk factors. SABRA LDL-C is now calculated using the Martin-Hopkins  calculation, which is a validated novel method providing  better accuracy than the Friedewald equation in the  estimation of LDL-C.  Gladis APPLETHWAITE et al. SANDREA. 7986;689(80): 2061-2068  (http://education.[facility].com/faq/FAQ164)     TARGET DATE:  The 9th of the month  Assessment: Brenon presents today for their maintenance [name]  injections. Initial/past injections were tolerated well without issues. No problems with systemic effects of injections. Recently diagnosed with lung cancer with possible metastases to his lymph nodes; plan for chemotherapy and surgery. Will follow with oncology to ensure minimal DDI with [name]  and chemotherapy options.   In order to schedule his surgery, he must quit smoking for at least 2 weeks prior to the surgery. Greg prescribed Wellbutrin , and now patient would like to try Chantix. Denies trying any nicotine  replacement therapy. Has been using multiple aromatherapy-type devices. Messaged Cathlyn about Chantix prescription and will await his response.   Administered cabotegravir  600mg /82mL in left upper outer  quadrant of the gluteal muscle. Administered rilpivirine  900 mg/3mL in the right upper outer quadrant of the gluteal muscle. Monitored patient for 10 minutes after injection. Injections were tolerated well without issue. Patient will follow up in 2 months for next injection. Will check HIV RNA today; viral load has been slowly increasing since April with latest result as 89 in December.  Eligible for COVID, Shingles, and Menveo vaccines; not discussed during today's visit.   Plan: - Administer [name]  injections  - Check HIV RNA  - Await Greg's response about Chantix  - Next injections scheduled for 4/13 with Cathlyn and 6/2 with me  - Call with any issues or questions  Alan Geralds, PharmD, CPP, BCIDP, AAHIVP Clinical Pharmacist Practitioner Infectious Diseases Clinical Pharmacist [facility]      [1] No Known Allergies  "

## 2024-11-26 ENCOUNTER — Other Ambulatory Visit: Payer: Self-pay

## 2024-11-26 ENCOUNTER — Ambulatory Visit: Admitting: Pharmacist

## 2024-11-26 DIAGNOSIS — B20 Human immunodeficiency virus [HIV] disease: Secondary | ICD-10-CM

## 2024-11-26 MED ORDER — CABOTEGRAVIR & RILPIVIRINE ER 600 & 900 MG/3ML IM SUER
1.0000 | Freq: Once | INTRAMUSCULAR | Status: AC
  Administered 2024-11-26: 1 via INTRAMUSCULAR

## 2024-11-28 LAB — HIV-1 RNA QUANT-NO REFLEX-BLD
HIV 1 RNA Quant: 33 {copies}/mL — ABNORMAL HIGH
HIV-1 RNA Quant, Log: 1.52 {Log_copies}/mL — ABNORMAL HIGH

## 2024-12-06 ENCOUNTER — Ambulatory Visit: Admitting: Thoracic Surgery (Cardiothoracic Vascular Surgery)

## 2025-02-03 ENCOUNTER — Encounter: Payer: Self-pay | Admitting: Family

## 2025-03-25 ENCOUNTER — Ambulatory Visit: Payer: Self-pay | Admitting: Pharmacist
